# Patient Record
Sex: Male | Born: 1945 | Race: White | Hispanic: No | Marital: Married | Smoking: Never smoker
Health system: Southern US, Community
[De-identification: ages and names within clinical notes are randomized; demographics above are authoritative.]

## PROBLEM LIST (undated history)

## (undated) DIAGNOSIS — N289 Disorder of kidney and ureter, unspecified: Secondary | ICD-10-CM

## (undated) DIAGNOSIS — J189 Pneumonia, unspecified organism: Secondary | ICD-10-CM

## (undated) DIAGNOSIS — J988 Other specified respiratory disorders: Secondary | ICD-10-CM

## (undated) DIAGNOSIS — K635 Polyp of colon: Secondary | ICD-10-CM

## (undated) DIAGNOSIS — E663 Overweight: Secondary | ICD-10-CM

## (undated) DIAGNOSIS — Z8742 Personal history of other diseases of the female genital tract: Secondary | ICD-10-CM

## (undated) DIAGNOSIS — C4491 Basal cell carcinoma of skin, unspecified: Secondary | ICD-10-CM

## (undated) DIAGNOSIS — R339 Retention of urine, unspecified: Secondary | ICD-10-CM

## (undated) DIAGNOSIS — K769 Liver disease, unspecified: Secondary | ICD-10-CM

## (undated) DIAGNOSIS — D649 Anemia, unspecified: Secondary | ICD-10-CM

## (undated) DIAGNOSIS — Z860100 Personal history of colon polyps, unspecified: Secondary | ICD-10-CM

## (undated) DIAGNOSIS — K648 Other hemorrhoids: Secondary | ICD-10-CM

## (undated) DIAGNOSIS — I10 Essential (primary) hypertension: Secondary | ICD-10-CM

## (undated) DIAGNOSIS — E7800 Pure hypercholesterolemia, unspecified: Secondary | ICD-10-CM

## (undated) DIAGNOSIS — N400 Enlarged prostate without lower urinary tract symptoms: Secondary | ICD-10-CM

## (undated) DIAGNOSIS — R053 Chronic cough: Secondary | ICD-10-CM

## (undated) DIAGNOSIS — R7309 Other abnormal glucose: Secondary | ICD-10-CM

## (undated) DIAGNOSIS — R0600 Dyspnea, unspecified: Secondary | ICD-10-CM

## (undated) DIAGNOSIS — J309 Allergic rhinitis, unspecified: Secondary | ICD-10-CM

## (undated) HISTORY — DX: Personal history of colonic polyps: Z86.010

## (undated) HISTORY — DX: Overweight: E66.3

## (undated) HISTORY — DX: Allergic rhinitis, unspecified: J30.9

## (undated) HISTORY — DX: Other abnormal glucose: R73.09

## (undated) HISTORY — DX: Other hemorrhoids: K64.8

## (undated) HISTORY — DX: Personal history of colon polyps, unspecified: Z86.0100

## (undated) HISTORY — DX: Basal cell carcinoma of skin, unspecified: C44.91

## (undated) HISTORY — DX: Pure hypercholesterolemia, unspecified: E78.00

## (undated) HISTORY — DX: Disorder of kidney and ureter, unspecified: N28.9

## (undated) HISTORY — DX: Personal history of other diseases of the female genital tract: Z87.42

## (undated) HISTORY — DX: Liver disease, unspecified: K76.9

## (undated) HISTORY — DX: Benign prostatic hyperplasia without lower urinary tract symptoms: N40.0

## (undated) HISTORY — DX: Retention of urine, unspecified: R33.9

## (undated) HISTORY — DX: Essential (primary) hypertension: I10

---

## 1976-04-28 HISTORY — PX: HEMORRHOID SURGERY: SHX153

## 1976-04-28 HISTORY — PX: VASECTOMY: SHX75

## 1980-04-28 HISTORY — PX: LAPAROSCOPIC CHOLECYSTECTOMY: SUR755

## 2005-07-10 ENCOUNTER — Ambulatory Visit: Payer: Self-pay | Admitting: Unknown Physician Specialty

## 2006-10-08 ENCOUNTER — Other Ambulatory Visit: Payer: Self-pay

## 2006-10-08 ENCOUNTER — Ambulatory Visit: Payer: Self-pay

## 2009-04-19 ENCOUNTER — Emergency Department: Payer: Self-pay | Admitting: Emergency Medicine

## 2010-04-28 DIAGNOSIS — C4491 Basal cell carcinoma of skin, unspecified: Secondary | ICD-10-CM

## 2010-04-28 HISTORY — DX: Basal cell carcinoma of skin, unspecified: C44.91

## 2010-04-28 HISTORY — PX: BASAL CELL CARCINOMA EXCISION: SHX1214

## 2010-10-03 IMAGING — US US EXTREM LOW VENOUS*L*
1 series · 17 of 24 positions shown · non-contrast
Comparison: none

REASON FOR EXAM: leg pain/swelling
COMMENTS:

PROCEDURE:     US  - US DOPPLER LOW EXTR LEFT  - April 20, 2009 [DATE]
RESULT:     There is no evidence of deep venous thrombosis. Color flow
analysis, Doppler analysis and compression studies are negative. Mildly
enlarged, left inguinal lymph node is noted. This is nonspecific.

[Series 1: us extrem low venous*left* · 17 of 30 slices shown]
[im 1/30]
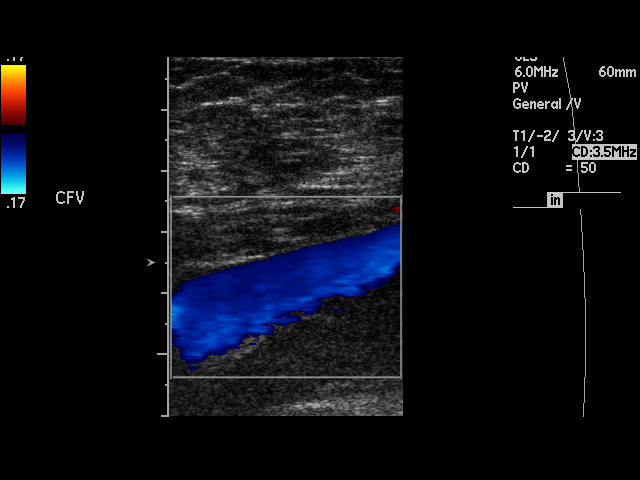
[im 3/30]
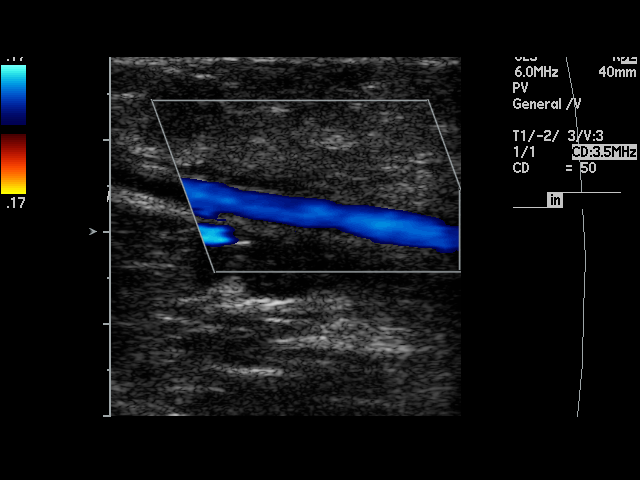
[im 4/30]
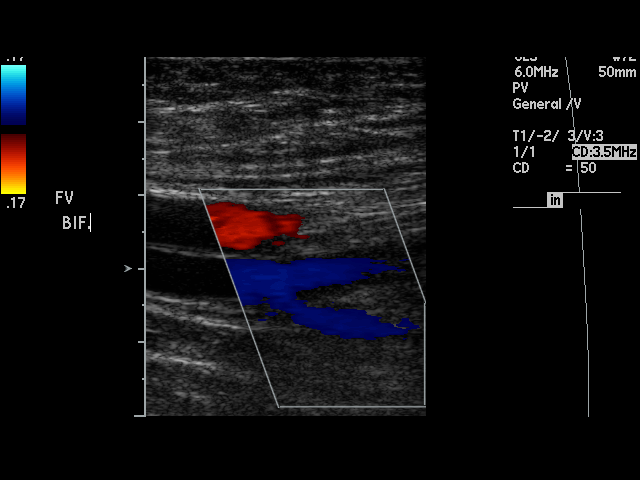
[im 6/30]
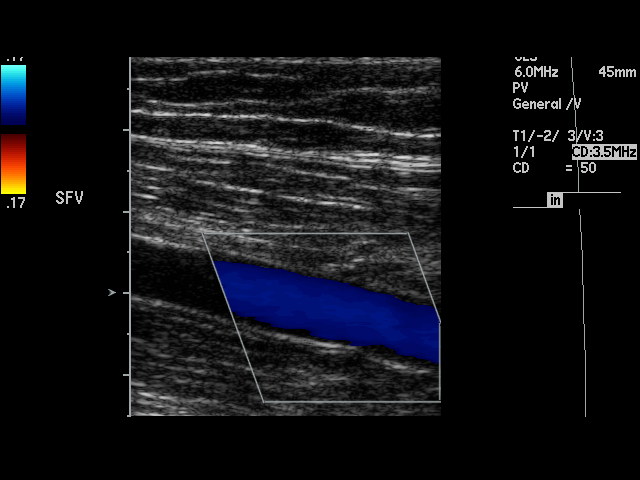
[im 8/30]
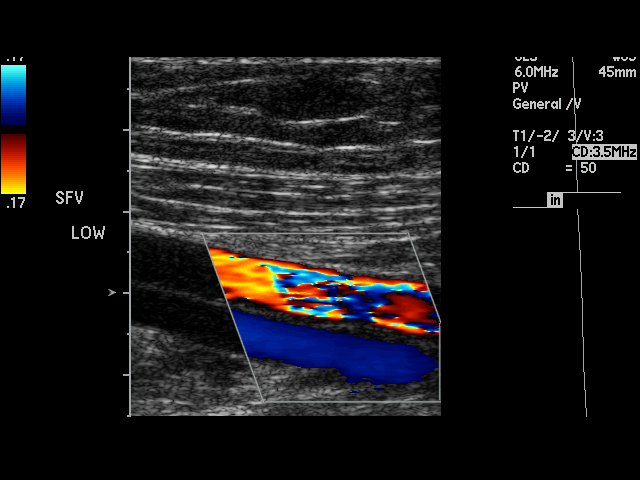
[im 9/30]
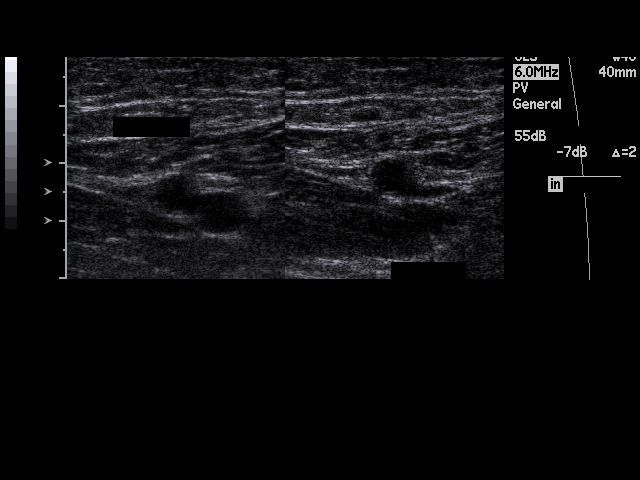
[im 12/30]
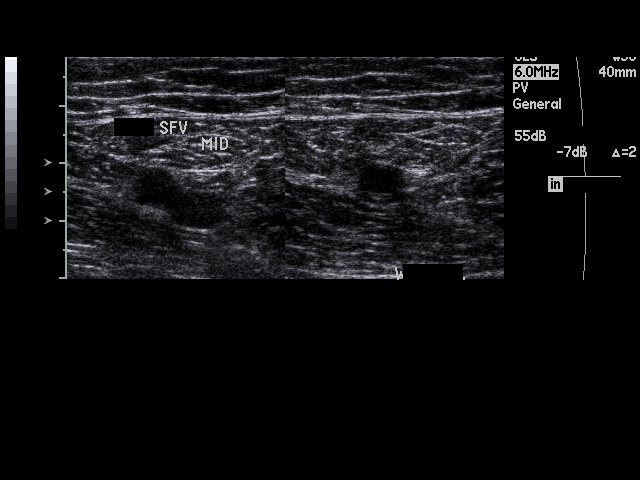
[im 13/30]
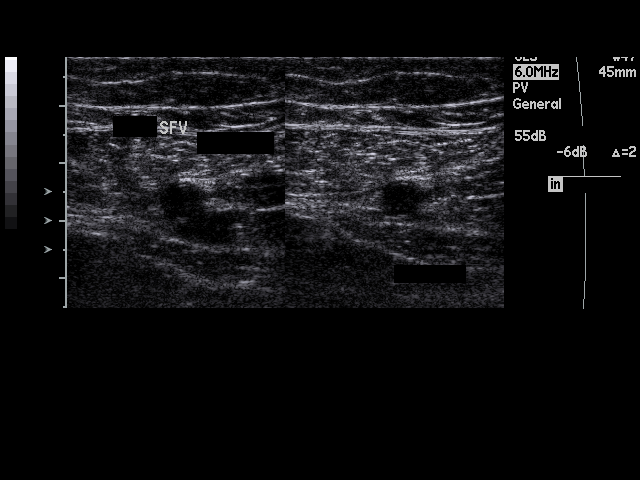
[im 16/30]
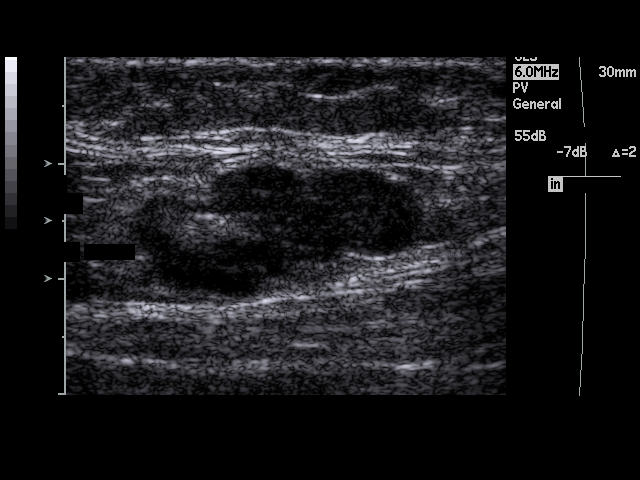
[im 17/30]
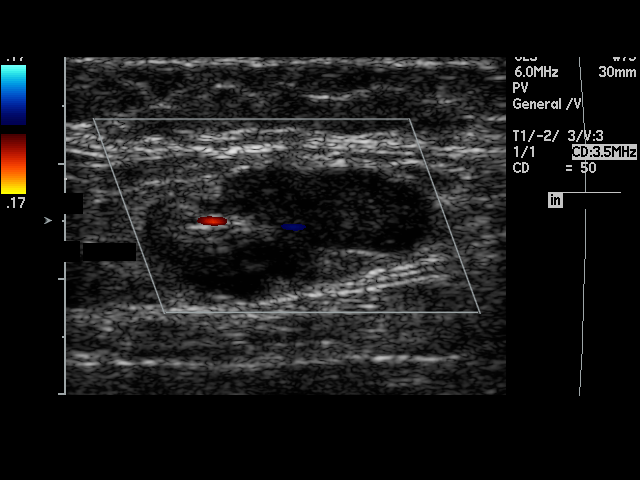
[im 18/30]
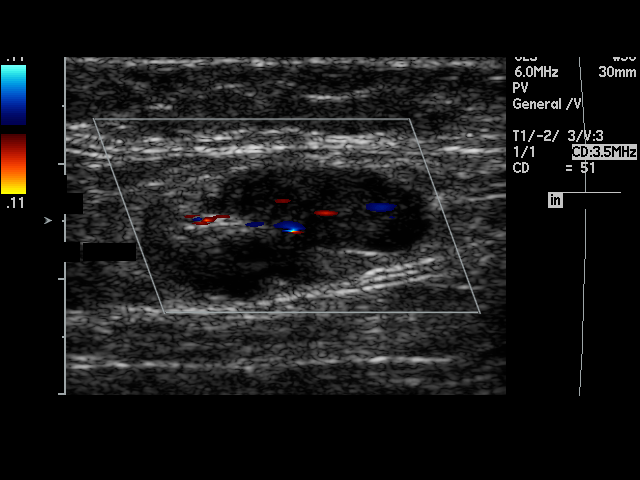
[im 21/30]
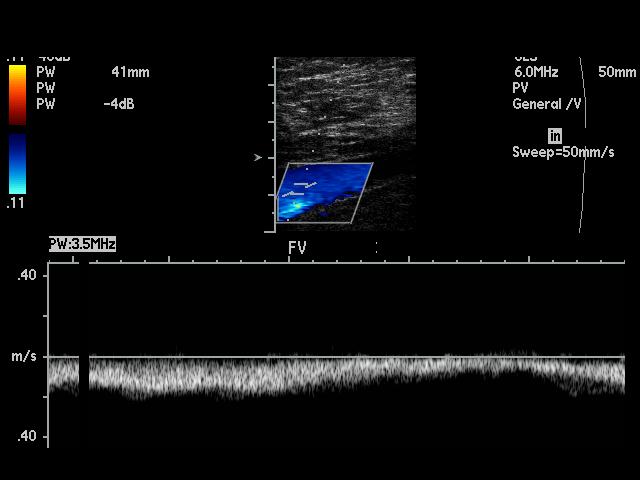
[im 22/30]
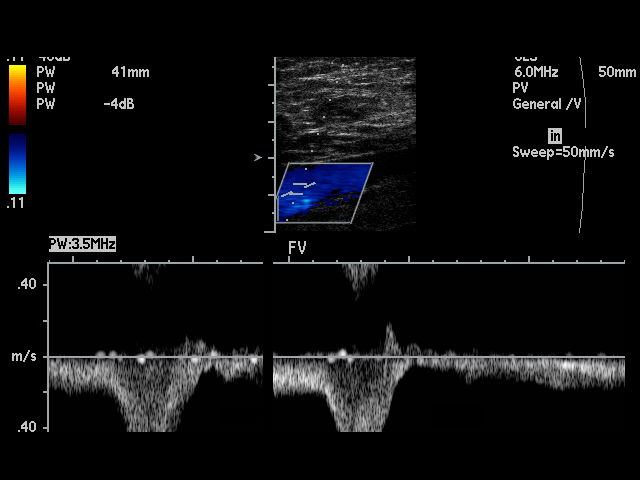
[im 24/30]
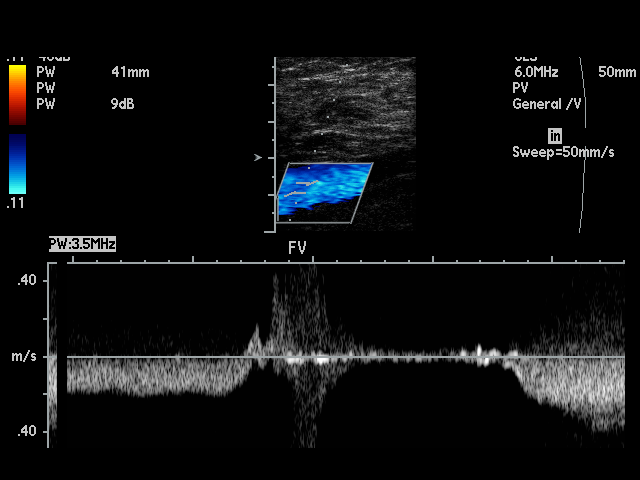
[im 26/30]
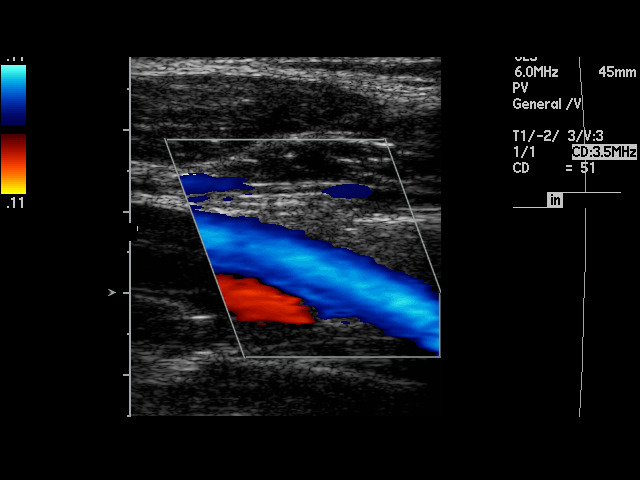
[im 27/30]
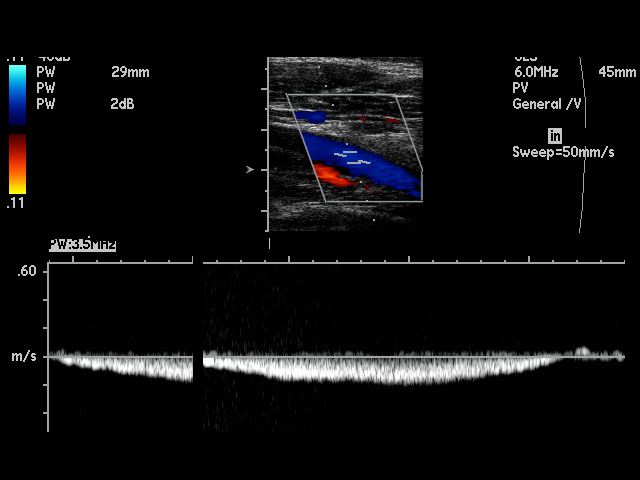
[im 30/30]
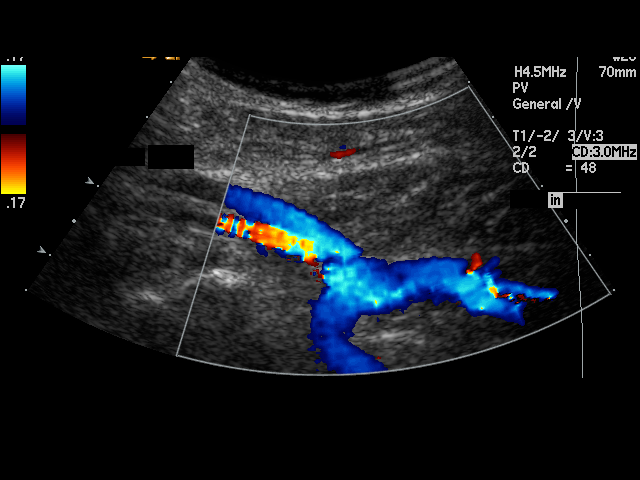

[17 of 24 positions shown; findings below may reference images not displayed]

IMPRESSION: 1.  No evidence of deep venous thrombosis.
2.  Mild prominence of left inguinal lymph node.

## 2010-11-13 ENCOUNTER — Ambulatory Visit: Payer: Self-pay | Admitting: Unknown Physician Specialty

## 2010-11-13 HISTORY — PX: COLONOSCOPY: SHX174

## 2010-11-15 LAB — PATHOLOGY REPORT

## 2011-05-06 ENCOUNTER — Ambulatory Visit: Payer: Self-pay | Admitting: Family Medicine

## 2012-05-17 ENCOUNTER — Encounter: Payer: Self-pay | Admitting: *Deleted

## 2012-05-20 ENCOUNTER — Encounter: Payer: Self-pay | Admitting: *Deleted

## 2012-05-29 HISTORY — PX: OTHER SURGICAL HISTORY: SHX169

## 2012-06-24 ENCOUNTER — Ambulatory Visit: Payer: Self-pay | Admitting: Otolaryngology

## 2012-10-25 ENCOUNTER — Encounter: Payer: Self-pay | Admitting: Family Medicine

## 2012-11-01 ENCOUNTER — Telehealth: Payer: Self-pay

## 2012-11-01 NOTE — Telephone Encounter (Signed)
PT NEVER SEEN HERE BEFORE, BUT HAVE AN APPT WITH DR [name] IN [place], SHE USE TO SEE HIM IN [place]. HE IS OUT OF HIS BP MEDICINE UNTIL HIS APPT PLEASE CALL HOME AT [phone] OR HIS CELL AT [phone]    [facility] ON [address] IN [place]

## 2012-11-01 NOTE — Telephone Encounter (Signed)
Left message, what medication is he requesting?

## 2012-11-02 MED ORDER — LISINOPRIL 10 MG PO TABS: 10.0000 mg | ORAL_TABLET | Freq: Every day | ORAL | Status: DC

## 2012-11-02 NOTE — Telephone Encounter (Signed)
Left message - rx sent in  

## 2012-11-02 NOTE — Telephone Encounter (Signed)
PT TAKE THE 10MG S OF LISINOPRIL. PLEASE CALL [phone] OR HIS CELL AT [phone]   [facility] ON [address] IN [place]

## 2012-11-02 NOTE — Telephone Encounter (Signed)
Call pt --- I sent in rx for Lisinopril 10mg  one daily to [facility] on [address].

## 2012-11-29 ENCOUNTER — Encounter: Payer: Self-pay | Admitting: Family Medicine

## 2012-11-29 ENCOUNTER — Ambulatory Visit (INDEPENDENT_AMBULATORY_CARE_PROVIDER_SITE_OTHER): Payer: Medicare Other | Admitting: Family Medicine

## 2012-11-29 VITALS — BP 164/100 | HR 79 | Temp 97.8°F | Resp 16 | Ht 72.0 in | Wt 231.2 lb

## 2012-11-29 DIAGNOSIS — I10 Essential (primary) hypertension: Secondary | ICD-10-CM | POA: Insufficient documentation

## 2012-11-29 DIAGNOSIS — E7800 Pure hypercholesterolemia, unspecified: Secondary | ICD-10-CM | POA: Insufficient documentation

## 2012-11-29 DIAGNOSIS — R7309 Other abnormal glucose: Secondary | ICD-10-CM

## 2012-11-29 DIAGNOSIS — Z0000 Encounter for general adult medical examination without abnormal findings: Secondary | ICD-10-CM

## 2012-11-29 MED ORDER — SIMVASTATIN 20 MG PO TABS: 20.0000 mg | ORAL_TABLET | Freq: Every evening | ORAL | Status: DC

## 2012-11-29 MED ORDER — LISINOPRIL 10 MG PO TABS: 10.0000 mg | ORAL_TABLET | Freq: Every day | ORAL | Status: DC

## 2012-11-29 NOTE — Assessment & Plan Note (Signed)
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 No change in medications; obtain EKG, u/a, labs.  Refill provided; f/u six months.

## 2012-11-29 NOTE — Progress Notes (Signed)
  Subjective:    Patient ID: Ruben Garcia, male    DOB: 1945/07/01, 67 y.o.   MRN: [id number]  HPI    Review of Systems  Hematological: Bruises/bleeds easily.       Objective:   Physical Exam        Assessment & Plan:

## 2012-11-29 NOTE — Assessment & Plan Note (Signed)
Stable; weight gain in past year of 9 pounds; recommend weight loss, exercise, dietary modification.  Obtain labs.

## 2012-11-29 NOTE — Assessment & Plan Note (Signed)
Anticipatory guidance --- weight loss, exercise.  Colonoscopy UTD.  Immunizations reviewed; clarify if received Pneumovax last year at [facility].  Evaluated every November by urology/Shannon McGowan.  Independent with ADLs; no evidence of depression; mild hearing loss and s/p recent formal hearing evaluation by Dr. Rickard Rhymes.  Low fall risk.  Has Living will.

## 2012-11-29 NOTE — Progress Notes (Signed)
[address]   [place], [place]  [address]   [phone]  Subjective:    Patient ID: Ruben Garcia, male    DOB: 21-May-1945, 67 y.o.   MRN: [id number]  HPI This 67 y.o. male presents for Welcome to Medicare CPE.  Last physical 09/2011. Colonoscopy 11/13/10; repeat in 5 years; one polyp; [place]. TDAP 05/03/2008. Zostavax 03/12/2011. Pneumovax 2013. Flu vaccine 01/28/2012. [facility]; no glaucoma or cataracts; reading glasses. Dental exam every six months.  HTN:  Not checking BP at home; [facility]; checked last week and [facility].  Reports good compliance with Lisinopril 10mg  daily; good tolerance to medication; good symptom control. When was taking Lisinopril 20 or 30mg , BP was frequently less than 100 systolic; felt very weak and dizzy.  This has resolved.  Hyperlipidemia:  Compliance with Simvastatin.  One year follow-up.  Reports good tolerance to medication and good symptom control.  Needs a refill.  BPH:  Urologist:  Michiel Cowboy.  Followed every November. Performs prostate and genital exam yearly.  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   Review of Systems  Constitutional: Negative.   HENT: Positive for hearing loss and tinnitus. Negative for ear pain, congestion, facial swelling, rhinorrhea, sneezing, neck pain, neck stiffness, postnasal drip and ear discharge.   Eyes: Negative.   Respiratory: Negative.   Cardiovascular: Negative.   Gastrointestinal: Negative.   Endocrine: Negative.   Genitourinary: Negative.   Musculoskeletal: Positive for arthralgias. Negative for myalgias, back pain, joint swelling and gait problem.  Skin: Negative.   Allergic/Immunologic: Negative.   Neurological: Negative.   Hematological: Negative for adenopathy. Bruises/bleeds easily.  Psychiatric/Behavioral: Negative.     Past Medical History  Diagnosis Date  . Hemorrhoids, internal   . Unspecified disorder of liver   . Other  abnormal glucose   . Personal history of colonic polyps   . Hypotension, unspecified   . Dehydration   . Personal history of other genital system and obstetric disorders(V13.29)   . Allergic rhinitis, cause unspecified   . Essential hypertension, benign   . Pure hypercholesterolemia   . Unspecified disorder of kidney and ureter   . Basal cell carcinoma of skin, site unspecified 04/28/2010    Nasal; Orson Aloe.    Past Surgical History  Procedure Laterality Date  . Hemorrhoid surgery      Adalene Gulotta  . Gallbladder surgery  1984  . Basal cell carcinoma excision  2012    Facial        [place]  . Ear surgery  05/2012    [name].  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  Benign.  . Colonoscopy  11/13/2010    single polyp; repeatin 5 years; M[facility]lin    Prior to Admission medications   Medication Sig Start Date End Date Taking? Authorizing Provider  aspirin 81 MG tablet Take 81 mg by mouth daily.   Yes Historical Provider, MD  Calcium Carbonate-Vitamin D (CALCIUM 600+D) 600-200 MG-UNIT TABS Take 1 tablet by mouth daily.   Yes Historical Provider, MD  cholecalciferol (VITAMIN D) 400 UNITS TABS Take 400 Units by mouth daily.   Yes Historical Provider, MD  cyanocobalamin 100 MCG tablet Take 100 mcg by mouth daily.   Yes Historical Provider, MD  lisinopril (PRINIVIL,ZESTRIL) 10 MG tablet Take 1 tablet (10 mg total) by mouth daily. 11/29/12  Yes Ethelda Chick, MD  Multiple Vitamins-Minerals (MULTIVITAMIN PO) Take by mouth daily.   Yes Historical Provider,  MD  Omega-3 Fatty Acids (FISH OIL CONCENTRATE) 1000 MG CAPS Take 1 capsule by mouth daily.   Yes Historical Provider, MD  simvastatin (ZOCOR) 20 MG tablet Take 1 tablet (20 mg total) by mouth every evening. 11/29/12  Yes Ethelda Chick, MD  vitamin C (ASCORBIC ACID) 500 MG tablet Take 500 mg by mouth daily.   Yes Historical Provider, MD  vitamin E 400 UNIT capsule Take 400 Units by mouth daily.   Yes Historical Provider, MD    Allergies  Allergen Reactions  .  Codeine Nausea Only    History   Social History  . Marital Status: Married    Spouse Name: N/A    Number of Children: 2  . Years of Education: college   Occupational History  . retired     Research officer, political party in 2005   Social History Main Topics  . Smoking status: Never Smoker   . Smokeless tobacco: Not on file  . Alcohol Use: Yes     Comment: occasional weekends beer, 4- 5 total Miller Light  . Drug Use: No  . Sexually Active: Yes   Other Topics Concern  . Not on file   Social History Narrative   Always uses seat belts. Smoke alarm and carbon monoxide detector in the home. Guns in the home stored in locked cabinet.Caffeine use: Coffee 2 servings per day, moderate amount. Exercise: Moderate walking 2 - 4 miles daily, 2 - 3 days per week.    Married x 44 years, happily.      Children:  2 children; 1 grandchild.      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      Tobacco: never      Alcohol:  Weekends; beer 4-6 per weekend.      Exercise:  Walking sporadically.      Seatbelt: 100%      Guns: secured guns.      Living Will: completed in 2014.  FULL CODE but no prolonged measures.      Family History  Problem Relation Age of Onset  . Heart disease Mother   . Diabetes Mother   . Hyperlipidemia Mother   . Arthritis Mother     rheumatoid  . Cancer Father     lung  . Diabetes Father   . Arthritis Sister   . Hyperlipidemia Sister   . Hyperlipidemia Sister   . Hypertension Sister   . Hyperlipidemia Sister        Objective:   Physical Exam  Nursing note and vitals reviewed. Constitutional: He is oriented to person, place, and time. He appears well-developed and well-nourished. No distress.  HENT:  Head: Normocephalic and atraumatic.  Right Ear: External ear normal.  Left Ear: External ear normal.  Nose: Nose normal.  Mouth/Throat: Oropharynx is clear and moist.  Eyes: Conjunctivae and EOM are normal. Pupils are equal, round, and reactive to light.    Neck: Normal range of motion. Neck supple. No JVD present. No thyromegaly present.  Cardiovascular: Normal rate, regular rhythm, normal heart sounds and intact distal pulses.  Exam reveals no gallop and no friction rub.   No murmur heard. Pulmonary/Chest: Effort normal and breath sounds normal. He has no wheezes. He has no rales.  Abdominal: Soft. Bowel sounds are normal. He exhibits no distension and no mass. There is no tenderness. There is no rebound and no guarding.  Musculoskeletal: Normal range of motion.  Lymphadenopathy:    He has no cervical adenopathy.  Neurological: He is alert and oriented to person, place, and time. He has normal reflexes. No cranial nerve deficit. He exhibits normal muscle tone. Coordination normal.  Skin: Skin is warm and dry. No rash noted. He is not diaphoretic. No erythema. No pallor.  Psychiatric: He has a normal mood and affect. His behavior is normal. Judgment and thought content normal.   EKG:  NSR; no ST changes.    Assessment & Plan:  Annual physical exam - Plan: EKG 12-Lead  Essential hypertension, benign - Plan: lisinopril (PRINIVIL,ZESTRIL) 10 MG tablet, CANCELED: CBC with Differential, CANCELED: CK, CANCELED: Comprehensive metabolic panel, CANCELED: POCT urinalysis dipstick  Pure hypercholesterolemia - Plan: simvastatin (ZOCOR) 20 MG tablet, CANCELED: CK, CANCELED: Lipid panel  Other abnormal glucose - Plan: CANCELED: Hemoglobin A1c

## 2012-11-29 NOTE — Assessment & Plan Note (Signed)
Controlled; obtain labs; refill provided of medication.  F/u six months.

## 2012-12-10 ENCOUNTER — Telehealth: Payer: Self-pay

## 2012-12-10 NOTE — Telephone Encounter (Signed)
Patient is requesting his labs be sent via e-mail to his daughter:  [email or web address]   [phone]

## 2012-12-13 NOTE — Telephone Encounter (Signed)
PT STATES HE REALLY NEED HIS LAB WORK SENT TO HIM BECAUSE HE HAVE A [facility]PT TOMORROW. PLEASE CALL [phone] AND LET HIM KNOW WHEN IT IS DONE

## 2012-12-13 NOTE — Telephone Encounter (Signed)
Called patient and apologized on behalf of April for giving the wrong information to the patient that we e-mail records or labs. We do not have that ability to do that. Patient understood. His records will be ready for pick up today, and he will come pick them up for his appt for the [place].

## 2012-12-14 ENCOUNTER — Telehealth: Payer: Self-pay

## 2012-12-14 NOTE — Telephone Encounter (Signed)
No labs on this patient. They were cancelled in the system. Asked Renee in the lab about this and she wasn't sure why the labs were cancelled. Left message for patient that we don't have any labs in the system.

## 2012-12-14 NOTE — Telephone Encounter (Signed)
PT STATES HE HAD REQUESTED HIS RECORDS BECAUSE OF AN [facility], CAME YESTERDAY TO PICK THEM UP, BUT NO LAB OR BLOOD WORK WAS INCLUDED WHICH HE NEED WOULD LIKE [place] TO FAX IT TO THE [facility] ATTN: CAROL MCMURROW P.A.C AT [phone] AND YOU MAY REACH PT AT [phone] WHEN DONE

## 2013-06-06 ENCOUNTER — Ambulatory Visit (INDEPENDENT_AMBULATORY_CARE_PROVIDER_SITE_OTHER): Payer: Medicare Other | Admitting: Family Medicine

## 2013-06-06 ENCOUNTER — Encounter: Payer: Self-pay | Admitting: Family Medicine

## 2013-06-06 VITALS — BP 140/80 | HR 70 | Temp 98.6°F | Resp 16 | Ht 71.5 in | Wt 224.6 lb

## 2013-06-06 DIAGNOSIS — R7309 Other abnormal glucose: Secondary | ICD-10-CM

## 2013-06-06 DIAGNOSIS — E7800 Pure hypercholesterolemia, unspecified: Secondary | ICD-10-CM

## 2013-06-06 DIAGNOSIS — I10 Essential (primary) hypertension: Secondary | ICD-10-CM

## 2013-06-06 NOTE — Progress Notes (Signed)
Subjective:    Patient ID: Ruben Garcia, male    DOB: 07-11-1945, 68 y.o.   MRN: [id number] This chart was scribed for Ruben Honour, MD by Rolanda Lundborg, ED Scribe. This patient was seen in room 22 and the patient's care was started at 9:33 AM.  Chief Complaint  Patient presents with  . Hypertension  . Hyperglycemia  . Hyperlipidemia    HPI HPI Comments: Ruben Garcia is a 68 y.o. male with a h/o HTN, hyperglycemia, and hyperlipidemia who presents to the [facility] for a 6 month follow up for HTN, hyperlipidemia, and glucose intolerance. Last visit sugar was 107. Hemoglobin A1C was normal at 5.3. Last physical in August 2014. His weight is down 7 pounds from last visit.   He states he and his wife have been on a diet since Christmas that is working well for them. He states his BP at home has been fluctuating as usual. It was 125/79 last night and 140/90 at home. He states the highest it goes is 150, which is rare. He is exercising 30 minutes 2x per week on the elliptical at home. He had his flu shot this year. He is having labs down tomorrow morning at [facility]. Reports compliance with Lisinopril 10mg  daily and Simvastatin 20mg  daily.  No side effects to medication; good tolerance to medication; good symptom control. Denies CP/palp/SOB/leg swelling/HA/dizziness/focal weakness.  PCP Reginia Forts, MD   Past Medical History  Diagnosis Date  . Hemorrhoids, internal   . Unspecified disorder of liver   . Other abnormal glucose   . Personal history of colonic polyps   . Hypotension, unspecified   . Dehydration   . Personal history of other genital system and obstetric disorders(V13.29)   . Allergic rhinitis, cause unspecified   . Essential hypertension, benign   . Pure hypercholesterolemia   . Unspecified disorder of kidney and ureter   . Basal cell carcinoma of skin, site unspecified 04/28/2010    Nasal; [name].   Current Outpatient Prescriptions on File Prior  to Visit  Medication Sig Dispense Refill  . aspirin 81 MG tablet Take 81 mg by mouth daily.      . Calcium Carbonate-Vitamin D (CALCIUM 600+D) 600-200 MG-UNIT TABS Take 1 tablet by mouth daily.      . cholecalciferol (VITAMIN D) 400 UNITS TABS Take 400 Units by mouth daily.      . cyanocobalamin 100 MCG tablet Take 100 mcg by mouth daily.      [name] Kitchen lisinopril (PRINIVIL,ZESTRIL) 10 MG tablet Take 1 tablet (10 mg total) by mouth daily.  90 tablet  3  . Multiple Vitamins-Minerals (MULTIVITAMIN PO) Take by mouth daily.      . Omega-3 Fatty Acids (FISH OIL CONCENTRATE) 1000 MG CAPS Take 1 capsule by mouth daily.      . simvastatin (ZOCOR) 20 MG tablet Take 1 tablet (20 mg total) by mouth every evening.  90 tablet  3  . vitamin C (ASCORBIC ACID) 500 MG tablet Take 500 mg by mouth daily.      . vitamin E 400 UNIT capsule Take 400 Units by mouth daily.       No current facility-administered medications on file prior to visit.   Allergies  Allergen Reactions  . Codeine Nausea Only   History   Social History  . Marital Status: Married    Spouse Name: N/A    Number of Children: 2  . Years of Education: college   Occupational History  .  retired     Actor in 2005   Social History Main Topics  . Smoking status: Never Smoker   . Smokeless tobacco: Not on file  . Alcohol Use: Yes     Comment: occasional weekends beer, 4- 5 total Miller Light  . Drug Use: No  . Sexual Activity: Yes   Other Topics Concern  . Not on file   Social History Narrative   Always uses seat belts. Smoke alarm and carbon monoxide detector in the home. Guns in the home stored in locked cabinet.Caffeine use: Coffee 2 servings per day, moderate amount. Exercise: Moderate walking 2 - 4 miles daily, 2 - 3 days per week.    Married x 44 years, happily.      Children:  2 children; 1 grandchild.      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      Tobacco: never      Alcohol:  Weekends; beer 4-6 per  weekend.      Exercise:  Walking sporadically.      Seatbelt: 100%      Guns: secured guns.      Living Will: completed in 2014.  FULL CODE but no prolonged measures.       Review of Systems  Constitutional: Negative for activity change, appetite change, fatigue and unexpected weight change.  Eyes: Negative for visual disturbance.  Respiratory: Negative for cough, shortness of breath, wheezing and stridor.   Cardiovascular: Positive for palpitations (rarely). Negative for chest pain and leg swelling.  Gastrointestinal: Negative for nausea, vomiting, abdominal pain, diarrhea and constipation.  Musculoskeletal: Positive for arthralgias (mild, bone spurs in shoulders).  Neurological: Negative for dizziness, tremors, facial asymmetry, speech difficulty, weakness, light-headedness, numbness and headaches.       Objective:   Physical Exam  Nursing note and vitals reviewed. Constitutional: He is oriented to person, place, and time. He appears well-developed and well-nourished. No distress.  HENT:  Head: Normocephalic and atraumatic.  Right Ear: External ear normal.  Left Ear: External ear normal.  Nose: Nose normal.  Mouth/Throat: Oropharynx is clear and moist.  Eyes: Conjunctivae and EOM are normal. Pupils are equal, round, and reactive to light.  Neck: Normal range of motion. Neck supple. No JVD present. Carotid bruit is not present. No tracheal deviation present.  Cardiovascular: Normal rate, regular rhythm and normal heart sounds.  Exam reveals no gallop and no friction rub.   No murmur heard. Pulmonary/Chest: Effort normal and breath sounds normal. No respiratory distress. He has no wheezes. He has no rales. He exhibits no tenderness.  Abdominal: Soft. Bowel sounds are normal. He exhibits no distension and no mass. There is no tenderness. There is no rebound and no guarding.  Musculoskeletal: Normal range of motion.  No swelling BLE.  Lymphadenopathy:    He has no cervical  adenopathy.  Neurological: He is alert and oriented to person, place, and time.  Skin: Skin is warm and dry. He is not diaphoretic.  Psychiatric: He has a normal mood and affect. His behavior is normal.    Filed Vitals:   06/06/13 0916  BP: 140/80  Pulse: 70  Temp: 98.6 F (37 C)  TempSrc: Oral  Resp: 16  Height: 5' 11.5" (1.816 m)  Weight: 224 lb 9.6 oz (101.878 kg)  SpO2: 97%         Assessment & Plan:   1. Pure hypercholesterolemia: controlled; obtain labs; continue current medication.  2. Other abnormal glucose : controlled  with weight loss, dietary modification, weight loss.  3. Essential hypertension, benign : controlled; obtain labs; continue current medications.   No orders of the defined types were placed in this encounter.   I personally performed the services described in this documentation, which was scribed in my presence.  The recorded information has been reviewed and is accurate.  Reginia Forts, M.D.  [facility] [address] [place], [place]  [address] [phone] phone [phone] fax

## 2013-06-12 ENCOUNTER — Telehealth: Payer: Self-pay | Admitting: *Deleted

## 2013-06-12 NOTE — Telephone Encounter (Signed)
lmom to cb for lab results.  1. No evidence of anemia 2. Blood sugar eleveted at 111. Recommend wt loss, exercise, low-sugar food choices. 3. One liver function test (ALT-47) slightly elevated.  Recommend repeating in 6 months at next visit. 4. Kidney function is normal 5. Cholesterol is under good control.  Dr. Tamala Julian

## 2013-06-12 NOTE — Telephone Encounter (Signed)
Pt notified of lab results

## 2013-07-13 ENCOUNTER — Encounter: Payer: Self-pay | Admitting: Family Medicine

## 2013-12-05 ENCOUNTER — Encounter: Payer: Self-pay | Admitting: Family Medicine

## 2013-12-05 ENCOUNTER — Ambulatory Visit (INDEPENDENT_AMBULATORY_CARE_PROVIDER_SITE_OTHER): Payer: Medicare Other | Admitting: Family Medicine

## 2013-12-05 VITALS — BP 165/86 | HR 63 | Temp 97.9°F | Resp 16 | Ht 71.5 in | Wt 217.8 lb

## 2013-12-05 DIAGNOSIS — Z23 Encounter for immunization: Secondary | ICD-10-CM

## 2013-12-05 DIAGNOSIS — R7309 Other abnormal glucose: Secondary | ICD-10-CM

## 2013-12-05 DIAGNOSIS — E7800 Pure hypercholesterolemia, unspecified: Secondary | ICD-10-CM

## 2013-12-05 DIAGNOSIS — Z125 Encounter for screening for malignant neoplasm of prostate: Secondary | ICD-10-CM

## 2013-12-05 DIAGNOSIS — Z0000 Encounter for general adult medical examination without abnormal findings: Secondary | ICD-10-CM

## 2013-12-05 DIAGNOSIS — I10 Essential (primary) hypertension: Secondary | ICD-10-CM

## 2013-12-05 LAB — POCT URINALYSIS DIPSTICK
Bilirubin, UA: NEGATIVE
Blood, UA: NEGATIVE
Glucose, UA: NEGATIVE
KETONES UA: NEGATIVE
LEUKOCYTES UA: NEGATIVE
Nitrite, UA: NEGATIVE
PH UA: 6
Protein, UA: NEGATIVE
Urobilinogen, UA: 0.2

## 2013-12-05 MED ORDER — LISINOPRIL 20 MG PO TABS: 20.0000 mg | ORAL_TABLET | Freq: Every day | ORAL | Status: DC

## 2013-12-05 MED ORDER — SIMVASTATIN 20 MG PO TABS: 20.0000 mg | ORAL_TABLET | Freq: Every evening | ORAL | Status: DC

## 2013-12-05 NOTE — Progress Notes (Signed)
Subjective:   This chart was scribed for Ruben Honour, MD by Thea Alken, ED Scribe. This patient was seen in room 25 and the patient's care was started at 8:42 AM.  Patient ID: Ruben Garcia, male    DOB: 1946/01/28, 68 y.o.   MRN: [id number]  HPI Chief Complaint  Patient presents with  . Annual Exam  . Hyperlipidemia  . Hypertension  . Hyperglycemia   HPI Comments: Ruben Garcia is a 68 y.o. male with h/o of hypercholesterolemia and HTN who presents to the [facility] for a physical exam.  Last visit 05/2013, during that visit pt had slightly elevated BP.   Pt weight was also down 7lbs at that time  Last physical 11/29/12. Colonoscopy 2012 repeat in 5 years  VACCINATIONS TDAP 2010.  Pneumonia vaccine  2013 Zoster vac 2012 Flu vaccines yearly.  OTHER PHYSICIANS Last seen optometrist 1 year ago. Pt was seen by dentist a couple weeks ago Dermatologist, Dr. [name]. Pt plans to to make an appointment with him soon.  PSH. Pt denies new health problems with his sisters. Pt has ben married 45 years. He has 2 children and 1 grandchild. Pt drinks 4-5 beers per weekend interchangeably with water. Pt walks 2.5 miles every other day. Pt made living will in 2014. Pt is allergic to codeine.  Pt reports he checks BP at stores such as [facility]. He reports BP is usually around 124/70's-80's. Pt denies new surgeries in that last year. Pt takes aspirin, lisinopril 10mg , simvastatin, vitamin E, vitamin C, B12 multi vitamins, fish oil 4 daily.  Pt has neck stiffness possibly from sleeping wrong.Pt denies knee pain and swelling in feet.   Pt reports intermittent trouble with erection. He denies decrease in sexual drive. He denies taking Viagra in the past.  He denies abdominal pain and bladder and bladder incontinence. He report he wakes up once to urinate at night. He denies straining to use the bathroom. Pt denies change in hearing. He has tinnitus but has always had this  without recent changes. He denies dizziness, light headiness. Pt denies CP and palpitations. He denies cough, SOB and sores in mouth.      Past Medical History  Diagnosis Date  . Hemorrhoids, internal   . Unspecified disorder of liver   . Other abnormal glucose   . Personal history of colonic polyps   . Personal history of other genital system and obstetric disorders(V13.29)   . Allergic rhinitis, cause unspecified   . Essential hypertension, benign   . Pure hypercholesterolemia   . Unspecified disorder of kidney and ureter   . Basal cell carcinoma of skin, site unspecified 04/28/2010    Nasal; [name].   Past Surgical History  Procedure Laterality Date  . Hemorrhoid surgery      [facility]  . Gallbladder surgery  1984  . Basal cell carcinoma excision  2012    Facial        [place]  . Ear surgery  05/2012    [name].  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  Benign.  . Colonoscopy  11/13/2010    single polyp; repeatin 5 years; [place]  . Cholecystectomy     Prior to Admission medications   Medication Sig Start Date End Date Taking? Authorizing Provider  aspirin 81 MG tablet Take 81 mg by mouth daily.   Yes Historical Provider, MD  Calcium Carbonate-Vitamin D (CALCIUM 600+D) 600-200 MG-UNIT TABS Take 1 tablet by mouth daily.   Yes Historical  Provider, MD  cholecalciferol (VITAMIN D) 400 UNITS TABS Take 400 Units by mouth daily.   Yes Historical Provider, MD  cyanocobalamin 100 MCG tablet Take 100 mcg by mouth daily.   Yes Historical Provider, MD  lisinopril (PRINIVIL,ZESTRIL) 10 MG tablet Take 1 tablet (10 mg total) by mouth daily. 11/29/12  Yes Ruben Honour, MD  Multiple Vitamins-Minerals (MULTIVITAMIN PO) Take by mouth daily.   Yes Historical Provider, MD  Omega-3 Fatty Acids (FISH OIL CONCENTRATE) 1000 MG CAPS Take 1 capsule by mouth daily.   Yes Historical Provider, MD  simvastatin (ZOCOR) 20 MG tablet Take 1 tablet (20 mg total) by mouth every evening. 11/29/12  Yes Ruben Honour, MD  vitamin C  (ASCORBIC ACID) 500 MG tablet Take 500 mg by mouth daily.   Yes Historical Provider, MD  vitamin E 400 UNIT capsule Take 400 Units by mouth daily.   Yes Historical Provider, MD   History   Social History  . Marital Status: Married    Spouse Name: N/A    Number of Children: 2  . Years of Education: college   Occupational History  . retired     Actor in 2005   Social History Main Topics  . Smoking status: Never Smoker   . Smokeless tobacco: Not on file  . Alcohol Use: Yes     Comment: occasional weekends beer, 4- 5 total Miller Light  . Drug Use: No  . Sexual Activity: Yes   Other Topics Concern  . Not on file   Social History Narrative   Always uses seat belts. Smoke alarm and carbon monoxide detector in the home. Guns in the home stored in locked cabinet.Caffeine use: Coffee 2 servings per day, moderate amount. Exercise: Moderate walking 2 - 4 miles daily, 2 - 3 days per week.    Married x 45 years, happily.      Children:  2 children; 1 grandchild.      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      Tobacco: never      Alcohol:  Weekends; beer 4-6 per weekend.      Exercise:  Walking every other day 2.24miles.      Seatbelt: 100%      Guns: secured guns.      Living Will: completed in 2014.  FULL CODE but no prolonged measures.     Family History  Problem Relation Age of Onset  . Heart disease Mother   . Diabetes Mother   . Hyperlipidemia Mother   . Arthritis Mother     rheumatoid  . Cancer Father     lung  . Diabetes Father   . Arthritis Sister   . Hyperlipidemia Sister   . Hyperlipidemia Sister   . Hypertension Sister   . Hyperlipidemia Sister     Review of Systems  Constitutional: Negative for fever, chills, diaphoresis, activity change, appetite change, fatigue and unexpected weight change.  HENT: Positive for tinnitus. Negative for congestion, dental problem, drooling, ear discharge, ear pain, facial swelling, hearing loss, mouth  sores, nosebleeds, postnasal drip, rhinorrhea, sinus pressure, sneezing, sore throat, trouble swallowing and voice change.   Eyes: Negative for photophobia, pain, discharge, redness, itching and visual disturbance.  Respiratory: Negative for apnea, cough, choking, chest tightness, shortness of breath, wheezing and stridor.   Cardiovascular: Negative for chest pain, palpitations and leg swelling.  Gastrointestinal: Negative for nausea, vomiting, abdominal pain, diarrhea, constipation, blood in stool, abdominal distention and anal  bleeding.  Endocrine: Negative for cold intolerance, heat intolerance, polydipsia, polyphagia and polyuria.  Genitourinary: Positive for decreased urine volume. Negative for dysuria, urgency, frequency, hematuria, flank pain, discharge, penile swelling, scrotal swelling, enuresis, difficulty urinating, genital sores, penile pain and testicular pain.  Musculoskeletal: Positive for neck stiffness. Negative for arthralgias, back pain, gait problem, joint swelling, myalgias and neck pain.  Skin: Negative for color change, pallor, rash and wound.  Allergic/Immunologic: Negative for environmental allergies, food allergies and immunocompromised state.  Neurological: Negative for dizziness, tremors, seizures, syncope, facial asymmetry, speech difficulty, weakness, light-headedness, numbness and headaches.  Hematological: Negative for adenopathy. Does not bruise/bleed easily.  Psychiatric/Behavioral: Negative for suicidal ideas, hallucinations, behavioral problems, confusion, sleep disturbance, self-injury, dysphoric mood, decreased concentration and agitation. The patient is not nervous/anxious and is not hyperactive.   All other systems reviewed and are negative.   Objective:   Physical Exam  Nursing note and vitals reviewed. Constitutional: He is oriented to person, place, and time. He appears well-developed and well-nourished. No distress.  HENT:  Head: Normocephalic and  atraumatic.  Right Ear: Hearing, tympanic membrane, external ear and ear canal normal.  Left Ear: Hearing, tympanic membrane, external ear and ear canal normal.  Nose: Nose normal.  Mouth/Throat: Uvula is midline, oropharynx is clear and moist and mucous membranes are normal.  Eyes: Conjunctivae and EOM are normal. Pupils are equal, round, and reactive to light.  Neck: Normal range of motion. Neck supple. Carotid bruit is not present. No thyromegaly present.  Cardiovascular: Normal rate, regular rhythm, normal heart sounds and intact distal pulses.  Exam reveals no gallop and no friction rub.   No murmur heard. BP-160/86  Pulmonary/Chest: Effort normal and breath sounds normal. No respiratory distress. He has no wheezes. He has no rales. He exhibits no tenderness.  Abdominal: Soft. Bowel sounds are normal. He exhibits no distension and no mass. There is no tenderness. There is no rebound and no guarding. Hernia confirmed negative in the right inguinal area and confirmed negative in the left inguinal area.  Genitourinary: Rectum normal, prostate normal and penis normal. Right testis shows no mass, no swelling and no tenderness. Left testis shows no mass, no swelling and no tenderness. Circumcised.  Musculoskeletal: Normal range of motion.       Right shoulder: Normal.       Left shoulder: Normal.       Cervical back: Normal.  Lymphadenopathy:    He has no cervical adenopathy.       Right: No inguinal adenopathy present.       Left: No inguinal adenopathy present.  Neurological: He is alert and oriented to person, place, and time. He has normal reflexes. No cranial nerve deficit. He exhibits normal muscle tone. Coordination normal.  Skin: Skin is warm and dry. No rash noted. He is not diaphoretic.  Psychiatric: He has a normal mood and affect. His behavior is normal. Judgment and thought content normal.    Filed Vitals:   12/05/13 0827  BP: 165/86  Pulse: 63  Temp: 97.9 F (36.6 C)    Resp: 16    Visual Acuity Screening   Right eye Left eye Both eyes  Without correction: 20/25 20/40 20/20   With correction:       Assessment & Plan:   1. Routine general medical examination at a health care facility   2. Pure hypercholesterolemia   3. Essential hypertension, benign   4. Other abnormal glucose   5. Screening for prostate cancer   6.  Need for prophylactic vaccination with Streptococcus pneumoniae (Pneumococcus) and Influenza vaccines    1. Annual Wellness Examination: anticipatory guidance provided-- exercise, weight loss.  Colonoscopy UTD. Immunizations UTD; s/p Prevnar 13. Obtain PSA.  Independent with ADLs; low fall risk. No evidence of depression.  No hearing loss.  Has living will and desires FULL CODE. 2.  Screening for prostate cancer: DRE and PSA obtained. 3.  HTN: moderately controlled; increase Lisinopril to 20mg  daily; obtain labs, u/a. 4.  Hypercholesterolemia: controlled; obtain labs; refill provided. 5. Glucose intolerance:  Stable; obtain labs; continue with dietary modification, weight loss, exercise. 6.  S/p Prevnar 13.  Meds ordered this encounter  Medications  . lisinopril (PRINIVIL,ZESTRIL) 20 MG tablet    Sig: Take 1 tablet (20 mg total) by mouth daily.    Dispense:  90 tablet    Refill:  3  . simvastatin (ZOCOR) 20 MG tablet    Sig: Take 1 tablet (20 mg total) by mouth every evening.    Dispense:  90 tablet    Refill:  3   I personally performed the services described in this documentation, which was scribed in my presence.  The recorded information has been reviewed and is accurate.   Reginia Forts, M.D.  [facility] [address] [place], [place]  [address] [phone] phone [phone] fax

## 2013-12-09 LAB — LIPID PANEL
CHOLESTEROL TOTAL: 149 mg/dL (ref 100–199)
Chol/HDL Ratio: 3.1 ratio units (ref 0.0–5.0)
HDL: 48 mg/dL (ref >39)
LDL Calculated: 74 mg/dL (ref 0–99)
Triglycerides: 135 mg/dL (ref 0–149)
VLDL Cholesterol Cal: 27 mg/dL (ref 5–40)

## 2013-12-09 LAB — CBC WITH DIFFERENTIAL/PLATELET
Basophils Absolute: 0 10*3/uL (ref 0.0–0.2)
Basos: 0 %
EOS ABS: 0.2 10*3/uL (ref 0.0–0.4)
Eos: 2 %
HCT: 43.6 % (ref 37.5–51.0)
Hemoglobin: 15.3 g/dL (ref 12.6–17.7)
IMMATURE GRANS (ABS): 0 10*3/uL (ref 0.0–0.1)
IMMATURE GRANULOCYTES: 0 %
LYMPHS: 37 %
Lymphocytes Absolute: 2.4 10*3/uL (ref 0.7–3.1)
MCH: 32.7 pg (ref 26.6–33.0)
MCHC: 35.1 g/dL (ref 31.5–35.7)
MCV: 93 fL (ref 79–97)
Monocytes Absolute: 0.6 10*3/uL (ref 0.1–0.9)
Monocytes: 8 %
NEUTROS PCT: 53 %
Neutrophils Absolute: 3.5 10*3/uL (ref 1.4–7.0)
RBC: 4.68 x10E6/uL (ref 4.14–5.80)
RDW: 12.9 % (ref 12.3–15.4)
WBC: 6.7 10*3/uL (ref 3.4–10.8)

## 2013-12-09 LAB — COMPREHENSIVE METABOLIC PANEL
ALBUMIN: 4.4 g/dL (ref 3.6–4.8)
ALT: 29 IU/L (ref 0–44)
AST: 25 IU/L (ref 0–40)
Albumin/Globulin Ratio: 1.6 (ref 1.1–2.5)
Alkaline Phosphatase: 39 IU/L (ref 39–117)
BUN/Creatinine Ratio: 9 — ABNORMAL LOW (ref 10–22)
BUN: 12 mg/dL (ref 8–27)
CO2: 26 mmol/L (ref 18–29)
CREATININE: 1.29 mg/dL — AB (ref 0.76–1.27)
Calcium: 9.7 mg/dL (ref 8.6–10.2)
Chloride: 100 mmol/L (ref 97–108)
GFR calc Af Amer: 66 mL/min/{1.73_m2} (ref >59)
GFR, EST NON AFRICAN AMERICAN: 57 mL/min/{1.73_m2} — AB (ref >59)
GLOBULIN, TOTAL: 2.7 g/dL (ref 1.5–4.5)
GLUCOSE: 97 mg/dL (ref 65–99)
Potassium: 4.9 mmol/L (ref 3.5–5.2)
Sodium: 142 mmol/L (ref 134–144)
TOTAL PROTEIN: 7.1 g/dL (ref 6.0–8.5)
Total Bilirubin: 1 mg/dL (ref 0.0–1.2)

## 2013-12-09 LAB — HEMOGLOBIN A1C
ESTIMATED AVERAGE GLUCOSE: 103 mg/dL
Hgb A1c MFr Bld: 5.2 % (ref 4.8–5.6)

## 2013-12-09 LAB — PSA: PSA: 0.3 ng/mL (ref 0.0–4.0)

## 2014-01-09 ENCOUNTER — Encounter: Payer: Self-pay | Admitting: Family Medicine

## 2014-06-05 ENCOUNTER — Encounter: Payer: Self-pay | Admitting: Family Medicine

## 2014-06-05 ENCOUNTER — Ambulatory Visit (INDEPENDENT_AMBULATORY_CARE_PROVIDER_SITE_OTHER): Payer: Medicare Other | Admitting: Family Medicine

## 2014-06-05 VITALS — BP 132/84 | HR 72 | Temp 98.0°F | Resp 16 | Ht 71.75 in | Wt 219.0 lb

## 2014-06-05 DIAGNOSIS — I10 Essential (primary) hypertension: Secondary | ICD-10-CM

## 2014-06-05 DIAGNOSIS — E7800 Pure hypercholesterolemia, unspecified: Secondary | ICD-10-CM

## 2014-06-05 DIAGNOSIS — R7309 Other abnormal glucose: Secondary | ICD-10-CM

## 2014-06-05 NOTE — Patient Instructions (Signed)

## 2014-06-05 NOTE — Progress Notes (Signed)
Subjective:    Patient ID: Ruben Garcia, male    DOB: [birth year], 69 y.o.   MRN: [id number]  06/05/2014  Hyperlipidemia and Hypertension   HPI This 69 y.o. male presents for six month follow-up:  1. HTN: Patient reports good compliance with medication, good tolerance to medication, and good symptom control.   Home BP running 130s/85-90s.  Walking 2.42miles daily.  Denies chest pain, palpitations, shortness of breath, leg swelling.  2.  Hypercholesterolemia:  Patient reports good compliance with medication, good tolerance to medication, and good symptom control.  Denies HA/dizziness/focal weakness/paresthesias.  3. Health maintenance: s/p flu vaccine; no problems with Prevnar 13.   Review of Systems  Constitutional: Negative for fever, chills, diaphoresis, activity change, appetite change and fatigue.  Respiratory: Negative for cough and shortness of breath.   Cardiovascular: Negative for chest pain, palpitations and leg swelling.  Gastrointestinal: Negative for nausea, vomiting, abdominal pain and diarrhea.  Endocrine: Negative for cold intolerance, heat intolerance, polydipsia, polyphagia and polyuria.  Skin: Negative for color change, rash and wound.  Neurological: Negative for dizziness, tremors, seizures, syncope, facial asymmetry, speech difficulty, weakness, light-headedness, numbness and headaches.  Psychiatric/Behavioral: Negative for sleep disturbance and dysphoric mood. The patient is not nervous/anxious.     Past Medical History  Diagnosis Date  . Hemorrhoids, internal   . Unspecified disorder of liver   . Other abnormal glucose   . Personal history of colonic polyps   . Personal history of other genital system and obstetric disorders(V13.29)   . Allergic rhinitis, cause unspecified   . Essential hypertension, benign   . Pure hypercholesterolemia   . Unspecified disorder of kidney and ureter   . Basal cell carcinoma of skin, site unspecified 04/28/2010    Nasal;  [name].   Past Surgical History  Procedure Laterality Date  . Hemorrhoid surgery      [facility]  . Gallbladder surgery  1984  . Basal cell carcinoma excision  2012    Facial        [facility]  . Ear surgery  05/2012    [name].  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  Benign.  . Colonoscopy  11/13/2010    single polyp; repeatin 5 years; [place]  . Cholecystectomy     Allergies  Allergen Reactions  . Codeine Nausea Only   Current Outpatient Prescriptions  Medication Sig Dispense Refill  . aspirin 81 MG tablet Take 81 mg by mouth daily.    . Calcium Carbonate-Vitamin D (CALCIUM 600+D) 600-200 MG-UNIT TABS Take 1 tablet by mouth daily.    . cholecalciferol (VITAMIN D) 400 UNITS TABS Take 400 Units by mouth daily.    . cyanocobalamin 100 MCG tablet Take 100 mcg by mouth daily.    [name] Kitchen lisinopril (PRINIVIL,ZESTRIL) 20 MG tablet Take 1 tablet (20 mg total) by mouth daily. 90 tablet 3  . Multiple Vitamins-Minerals (MULTIVITAMIN PO) Take by mouth daily.    . Omega-3 Fatty Acids (FISH OIL CONCENTRATE) 1000 MG CAPS Take 1 capsule by mouth daily.    . simvastatin (ZOCOR) 20 MG tablet Take 1 tablet (20 mg total) by mouth every evening. 90 tablet 3  . vitamin C (ASCORBIC ACID) 500 MG tablet Take 500 mg by mouth daily.    . vitamin E 400 UNIT capsule Take 400 Units by mouth daily.     No current facility-administered medications for this visit.       Objective:    BP 132/84 mmHg  Pulse 72  Temp(Src) 98 F (36.7 C) (  Oral)  Resp 16  Ht 5' 11.75" (1.822 m)  Wt 219 lb (99.338 kg)  BMI 29.92 kg/m2  SpO2 97% Physical Exam  Constitutional: He is oriented to person, place, and time. He appears well-developed and well-nourished. No distress.  HENT:  Head: Normocephalic and atraumatic.  Right Ear: External ear normal.  Left Ear: External ear normal.  Nose: Nose normal.  Mouth/Throat: Oropharynx is clear and moist.  Eyes: Conjunctivae and EOM are normal. Pupils are equal, round, and reactive to light.  Neck:  Normal range of motion. Neck supple. Carotid bruit is not present. No thyromegaly present.  Cardiovascular: Normal rate, regular rhythm, normal heart sounds and intact distal pulses.  Exam reveals no gallop and no friction rub.   No murmur heard. Pulmonary/Chest: Effort normal and breath sounds normal. He has no wheezes. He has no rales.  Abdominal: Soft. Bowel sounds are normal. He exhibits no distension and no mass. There is no tenderness. There is no rebound and no guarding.  Lymphadenopathy:    He has no cervical adenopathy.  Neurological: He is alert and oriented to person, place, and time. No cranial nerve deficit.  Skin: Skin is warm and dry. No rash noted. He is not diaphoretic.  Psychiatric: He has a normal mood and affect. His behavior is normal.  Nursing note and vitals reviewed.  Results for orders placed or performed in visit on 12/05/13  CBC with Differential  Result Value Ref Range   WBC 6.7 3.4 - 10.8 x10E3/uL   RBC 4.68 4.14 - 5.80 x10E6/uL   Hemoglobin 15.3 12.6 - 17.7 g/dL   HCT 43.6 37.5 - 51.0 %   MCV 93 79 - 97 fL   MCH 32.7 26.6 - 33.0 pg   MCHC 35.1 31.5 - 35.7 g/dL   RDW 12.9 12.3 - 15.4 %   Neutrophils Relative % 53 %   Lymphs 37 %   Monocytes 8 %   Eos 2 %   Basos 0 %   Neutrophils Absolute 3.5 1.4 - 7.0 x10E3/uL   Lymphocytes Absolute 2.4 0.7 - 3.1 x10E3/uL   Monocytes Absolute 0.6 0.1 - 0.9 x10E3/uL   Eosinophils Absolute 0.2 0.0 - 0.4 x10E3/uL   Basophils Absolute 0.0 0.0 - 0.2 x10E3/uL   Immature Granulocytes 0 %   Immature Grans (Abs) 0.0 0.0 - 0.1 x10E3/uL  Lipid panel  Result Value Ref Range   Cholesterol, Total 149 100 - 199 mg/dL   Triglycerides 135 0 - 149 mg/dL   HDL 48 >39 mg/dL   VLDL Cholesterol Cal 27 5 - 40 mg/dL   LDL Calculated 74 0 - 99 mg/dL   Chol/HDL Ratio 3.1 0.0 - 5.0 ratio units  Hemoglobin A1c  Result Value Ref Range   Hgb A1c MFr Bld 5.2 4.8 - 5.6 %   Est. average glucose Bld gHb Est-mCnc 103 mg/dL  Comprehensive  metabolic panel  Result Value Ref Range   Glucose 97 65 - 99 mg/dL   BUN 12 8 - 27 mg/dL   Creatinine, Ser 1.29 (H) 0.76 - 1.27 mg/dL   GFR calc non Af Amer 57 (L) >59 mL/min/1.73   GFR calc Af Amer 66 >59 mL/min/1.73   BUN/Creatinine Ratio 9 (L) 10 - 22   Sodium 142 134 - 144 mmol/L   Potassium 4.9 3.5 - 5.2 mmol/L   Chloride 100 97 - 108 mmol/L   CO2 26 18 - 29 mmol/L   Calcium 9.7 8.6 - 10.2 mg/dL   Total Protein  7.1 6.0 - 8.5 g/dL   Albumin 4.4 3.6 - 4.8 g/dL   Globulin, Total 2.7 1.5 - 4.5 g/dL   Albumin/Globulin Ratio 1.6 1.1 - 2.5   Total Bilirubin 1.0 0.0 - 1.2 mg/dL   Alkaline Phosphatase 39 39 - 117 IU/L   AST 25 0 - 40 IU/L   ALT 29 0 - 44 IU/L  PSA  Result Value Ref Range   PSA 0.3 0.0 - 4.0 ng/mL  POCT urinalysis dipstick  Result Value Ref Range   Color, UA yellow    Clarity, UA clear    Glucose, UA neg    Bilirubin, UA neg    Ketones, UA neg    Spec Grav, UA <=1.005    Blood, UA neg    pH, UA 6.0    Protein, UA neg    Urobilinogen, UA 0.2    Nitrite, UA neg    Leukocytes, UA Negative        Assessment & Plan:   1. Essential hypertension, benign   2. Pure hypercholesterolemia   3. Abnormal glucose     1. HTN: controlled; obtain labs; continue current medication; follow-up six months. 2.  Hyperlipidemia: controlled; obtain labs; continue current medications. 3. Glucose intolerance: improved; normal HgbA1c at last visit.   No orders of the defined types were placed in this encounter.    Return in about 6 months (around 12/04/2014) for complete physical examiniation.    Altovise Wahler Elayne Guerin, M.D. [facility] [address] [place], [place]  [address] [phone] phone [phone] fax

## 2014-06-08 ENCOUNTER — Other Ambulatory Visit: Payer: Self-pay | Admitting: Family Medicine

## 2014-06-09 LAB — COMPREHENSIVE METABOLIC PANEL
ALK PHOS: 40 IU/L (ref 39–117)
ALT: 44 IU/L (ref 0–44)
AST: 35 IU/L (ref 0–40)
Albumin/Globulin Ratio: 1.8 (ref 1.1–2.5)
Albumin: 4.4 g/dL (ref 3.6–4.8)
BUN/Creatinine Ratio: 9 — ABNORMAL LOW (ref 10–22)
BUN: 12 mg/dL (ref 8–27)
Bilirubin Total: 0.9 mg/dL (ref 0.0–1.2)
CALCIUM: 10 mg/dL (ref 8.6–10.2)
CO2: 24 mmol/L (ref 18–29)
CREATININE: 1.28 mg/dL — AB (ref 0.76–1.27)
Chloride: 103 mmol/L (ref 97–108)
GFR calc Af Amer: 67 mL/min/{1.73_m2} (ref >59)
GFR calc non Af Amer: 58 mL/min/{1.73_m2} — ABNORMAL LOW (ref >59)
GLOBULIN, TOTAL: 2.4 g/dL (ref 1.5–4.5)
Glucose: 108 mg/dL — ABNORMAL HIGH (ref 65–99)
Potassium: 5.4 mmol/L — ABNORMAL HIGH (ref 3.5–5.2)
SODIUM: 142 mmol/L (ref 134–144)
TOTAL PROTEIN: 6.8 g/dL (ref 6.0–8.5)

## 2014-06-09 LAB — CBC
HEMATOCRIT: 44.7 % (ref 37.5–51.0)
HEMOGLOBIN: 15.7 g/dL (ref 12.6–17.7)
MCH: 32.8 pg (ref 26.6–33.0)
MCHC: 35.1 g/dL (ref 31.5–35.7)
MCV: 93 fL (ref 79–97)
Platelets: 254 10*3/uL (ref 150–379)
RBC: 4.79 x10E6/uL (ref 4.14–5.80)
RDW: 12.8 % (ref 12.3–15.4)
WBC: 7.7 10*3/uL (ref 3.4–10.8)

## 2014-06-09 LAB — LIPID PANEL
CHOL/HDL RATIO: 3.4 ratio (ref 0.0–5.0)
Cholesterol, Total: 165 mg/dL (ref 100–199)
HDL: 48 mg/dL (ref >39)
LDL CALC: 92 mg/dL (ref 0–99)
Triglycerides: 125 mg/dL (ref 0–149)
VLDL Cholesterol Cal: 25 mg/dL (ref 5–40)

## 2014-06-09 LAB — HEMOGLOBIN A1C
ESTIMATED AVERAGE GLUCOSE: 105 mg/dL
HEMOGLOBIN A1C: 5.3 % (ref 4.8–5.6)

## 2014-11-28 ENCOUNTER — Other Ambulatory Visit: Payer: Self-pay | Admitting: Family Medicine

## 2014-12-11 ENCOUNTER — Encounter: Payer: Self-pay | Admitting: Family Medicine

## 2014-12-11 ENCOUNTER — Ambulatory Visit (INDEPENDENT_AMBULATORY_CARE_PROVIDER_SITE_OTHER): Payer: Medicare Other | Admitting: Family Medicine

## 2014-12-11 VITALS — BP 159/96 | HR 65 | Temp 97.5°F | Resp 16 | Ht 71.75 in | Wt 221.8 lb

## 2014-12-11 DIAGNOSIS — E7800 Pure hypercholesterolemia, unspecified: Secondary | ICD-10-CM

## 2014-12-11 DIAGNOSIS — Z0000 Encounter for general adult medical examination without abnormal findings: Secondary | ICD-10-CM | POA: Diagnosis not present

## 2014-12-11 DIAGNOSIS — Z85828 Personal history of other malignant neoplasm of skin: Secondary | ICD-10-CM | POA: Insufficient documentation

## 2014-12-11 DIAGNOSIS — Z125 Encounter for screening for malignant neoplasm of prostate: Secondary | ICD-10-CM | POA: Diagnosis not present

## 2014-12-11 DIAGNOSIS — I10 Essential (primary) hypertension: Secondary | ICD-10-CM | POA: Diagnosis not present

## 2014-12-11 DIAGNOSIS — R7302 Impaired glucose tolerance (oral): Secondary | ICD-10-CM

## 2014-12-11 LAB — POCT URINALYSIS DIPSTICK
Bilirubin, UA: NEGATIVE
Glucose, UA: NEGATIVE
KETONES UA: NEGATIVE
Leukocytes, UA: NEGATIVE
Nitrite, UA: NEGATIVE
PROTEIN UA: NEGATIVE
RBC UA: NEGATIVE
UROBILINOGEN UA: 0.2
pH, UA: 5.5

## 2014-12-11 MED ORDER — SIMVASTATIN 20 MG PO TABS: ORAL_TABLET | ORAL | Status: DC

## 2014-12-11 MED ORDER — LISINOPRIL 20 MG PO TABS
ORAL_TABLET | ORAL | Status: DC
Start: 2014-12-11 — End: 2015-12-11

## 2014-12-11 NOTE — Progress Notes (Signed)
Subjective:    Patient ID: Ruben Garcia, male    DOB: [birth year], 69 y.o.   MRN: [id number]  HPI This 69 y.o. male presents for Annual Wellness Examination.  Last physical:  12-05-2013 Colonoscopy: 2012; repeat in 5 years.  [facility].  +colon polyps single TDAP:  2010 Pneumovax:  2015 Prevnar-13; previous Pneumovax at pharmacy in 2015.  Zostavax:  2012 Influenza:  2015 Eye exam:  [facility] 2015; no glaucoma or cataracts.  Reading glasses. Dental exam:  Every six months.   HTN: Patient reports good compliance with medication, good tolerance to medication, and good symptom control.  Home BP elevated today 120/80, 144/90.  Unusual high BP.  Fluctuates.  867/61-950/93.  Hypercholesterolemia: Patient reports good compliance with medication, good tolerance to medication, and good symptom control.    Glucose Intolerance: watching diet closely; needs to lose weight but struggling.    BPH:  Prostate exam by urology/[facility] every November.  Basal cell carcinoma: several years ago was last dermatology evaluation.  [name]; retired.  Last visit 4 years.       Review of Systems  Constitutional: Negative.  Negative for fever, chills, diaphoresis, activity change, appetite change, fatigue and unexpected weight change.  HENT: Negative.  Negative for congestion, dental problem, drooling, ear discharge, ear pain, facial swelling, hearing loss, mouth sores, nosebleeds, postnasal drip, rhinorrhea, sinus pressure, sneezing, sore throat, tinnitus, trouble swallowing and voice change.   Eyes: Negative.  Negative for photophobia, pain, discharge, redness, itching and visual disturbance.  Respiratory: Negative.  Negative for apnea, cough, choking, chest tightness, shortness of breath, wheezing and stridor.   Cardiovascular: Negative.  Negative for chest pain, palpitations and leg swelling.  Gastrointestinal: Negative.  Negative for nausea, vomiting, abdominal pain, diarrhea, constipation and blood in stool.    Endocrine: Negative.  Negative for cold intolerance, heat intolerance, polydipsia, polyphagia and polyuria.  Genitourinary: Negative.  Negative for dysuria, urgency, frequency, hematuria, flank pain, decreased urine volume, discharge, penile swelling, scrotal swelling, enuresis, difficulty urinating, genital sores, penile pain and testicular pain.  Musculoskeletal: Positive for back pain and arthralgias. Negative for myalgias, joint swelling, gait problem, neck pain and neck stiffness.  Skin: Negative.  Negative for color change, pallor, rash and wound.  Allergic/Immunologic: Negative.  Negative for environmental allergies, food allergies and immunocompromised state.  Neurological: Negative.  Negative for dizziness, tremors, seizures, syncope, facial asymmetry, speech difficulty, weakness, light-headedness, numbness and headaches.  Hematological: Negative.  Negative for adenopathy. Does not bruise/bleed easily.  Psychiatric/Behavioral: Negative.  Negative for suicidal ideas, hallucinations, behavioral problems, confusion, sleep disturbance, self-injury, dysphoric mood, decreased concentration and agitation. The patient is not nervous/anxious and is not hyperactive.    Past Medical History  Diagnosis Date  . Hemorrhoids, internal   . Unspecified disorder of liver   . Other abnormal glucose   . Personal history of colonic polyps   . Personal history of other genital system and obstetric disorders(V13.29)   . Allergic rhinitis, cause unspecified   . Essential hypertension, benign   . Pure hypercholesterolemia   . Unspecified disorder of kidney and ureter   . Basal cell carcinoma of skin, site unspecified 04/28/2010    Nasal; [name].   Past Surgical History  Procedure Laterality Date  . Hemorrhoid surgery      Merita Hawks  . Gallbladder surgery  1984  . Basal cell carcinoma excision  2012    Facial        [facility]  . Ear surgery  05/2012    [name].  Warty  growth in ear.  Benign.  .  Colonoscopy  11/13/2010    single polyp; repeatin 5 years; [place]  . Cholecystectomy     Allergies  Allergen Reactions  . Codeine Nausea Only   Social History   Social History  . Marital Status: Married    Spouse Name: N/A  . Number of Children: 2  . Years of Education: college   Occupational History  . retired     Actor in 2005   Social History Main Topics  . Smoking status: Never Smoker   . Smokeless tobacco: Not on file  . Alcohol Use: Yes     Comment: occasional weekends beer, 4- 5 total Miller Light  . Drug Use: No  . Sexual Activity: Yes   Other Topics Concern  . Not on file   Social History Narrative   Always uses seat belts. Smoke alarm and carbon monoxide detector in the home. Guns in the home stored in locked cabinet.Caffeine use: Coffee 2 servings per day, moderate amount. Exercise: Moderate walking 2 - 4 miles daily, 2 - 3 days per week.    Married x 45 years, happily.      Children:  2 children; 1 grandchild.      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      Tobacco: never      Alcohol:  Weekends; beer 4-6 per weekend.      Exercise:  Walking every other day 2.54miles.      Seatbelt: 100%      Guns: secured guns.      Living Will: completed in 2014.  FULL CODE but no prolonged measures.     Family History  Problem Relation Age of Onset  . Heart disease Mother   . Diabetes Mother   . Hyperlipidemia Mother   . Arthritis Mother     rheumatoid  . Cancer Father     lung  . Diabetes Father   . Arthritis Sister   . Hyperlipidemia Sister   . Hyperlipidemia Sister   . Hypertension Sister   . Hyperlipidemia Sister        Objective:   Physical Exam  Constitutional: He is oriented to person, place, and time. He appears well-developed and well-nourished. No distress.  HENT:  Head: Normocephalic and atraumatic.  Right Ear: External ear normal.  Left Ear: External ear normal.  Nose: Nose normal.  Mouth/Throat: Oropharynx is  clear and moist.  Eyes: Conjunctivae and EOM are normal. Pupils are equal, round, and reactive to light.  Neck: Normal range of motion. Neck supple. Carotid bruit is not present. No thyromegaly present.  Cardiovascular: Normal rate, regular rhythm, normal heart sounds and intact distal pulses.  Exam reveals no gallop and no friction rub.   No murmur heard. Pulmonary/Chest: Effort normal and breath sounds normal. He has no wheezes. He has no rales.  Abdominal: Soft. Bowel sounds are normal. He exhibits no distension and no mass. There is no tenderness. There is no rebound and no guarding.  Musculoskeletal:       Right shoulder: Normal.       Left shoulder: Normal.       Cervical back: Normal.  Lymphadenopathy:    He has no cervical adenopathy.  Neurological: He is alert and oriented to person, place, and time. He has normal reflexes. No cranial nerve deficit. He exhibits normal muscle tone. Coordination normal.  Skin: Skin is warm and dry. No rash noted. He is not diaphoretic.  Psychiatric: He has a normal mood and affect. His behavior is normal. Judgment and thought content normal.    Results for orders placed or performed in visit on 12/11/14  POCT urinalysis dipstick  Result Value Ref Range   Color, UA yellow    Clarity, UA clear    Glucose, UA neg    Bilirubin, UA neg    Ketones, UA neg    Spec Grav, UA <=1.005    Blood, UA neg    pH, UA 5.5    Protein, UA neg    Urobilinogen, UA 0.2    Nitrite, UA neg    Leukocytes, UA Negative Negative       Assessment & Plan:  Encounter for Medicare annual wellness exam  Pure hypercholesterolemia - Plan: simvastatin (ZOCOR) 20 MG tablet, CBC with Differential/Platelet, Comprehensive metabolic panel, Lipid panel  Essential hypertension, benign - Plan: lisinopril (PRINIVIL,ZESTRIL) 20 MG tablet, POCT urinalysis dipstick, CBC with Differential/Platelet, Comprehensive metabolic panel  Glucose intolerance (impaired glucose tolerance) -  Plan: Hemoglobin A1c  Encounter for prostate cancer screening - Plan: POCT urinalysis dipstick, PSA, Medicare   1. Annual Wellness Examination: anticipatory guidance --- weight loss, exercise.  Colonoscopy UTD.  Immunizations UTD; will warrant Pneumovax at next visit.  Independent with ADLs. Has living will and desires FULL CODE.  No evidence of depression.  Low fall risk.  No urinary incontinence.  No hearing loss. 2.  Hypercholesterolemia: controlled; obtain labs; refills provided. 3.  HTN: uncontrolled today; monitor BP daily for next week; call office if BP> 140/90.  Refill of current medication provided. 4.  Glucose Intolerance:  Stable; obtain labs; continue with dietary modification; recommend weight loss and exercise. 5.  Prostate cancer screening: obtain PSA; followed by urology annually in November.  6. Basal cell carcinoma nasal: stable; s/p surgical resection; recommend follow-up with dermatology this year.   Meds ordered this encounter  Medications  . lisinopril (PRINIVIL,ZESTRIL) 20 MG tablet    Sig: TAKE ONE TABLET BY MOUTH ONCE DAILY    Dispense:  90 tablet    Refill:  3  . simvastatin (ZOCOR) 20 MG tablet    Sig: TAKE ONE TABLET BY MOUTH IN THE EVENING    Dispense:  90 tablet    Refill:  3    Norwood Levo, M.D. [facility] [address] [place],   [address] [phone] phone [phone] fax

## 2014-12-11 NOTE — Patient Instructions (Signed)
[email or web address].matthews.info/  Keeping you healthy  Get these tests  Blood pressure- Have your blood pressure checked once a year by your healthcare provider.  Normal blood pressure is 120/80  Weight- Have your body mass index (BMI) calculated to screen for obesity.  BMI is a measure of body fat based on height and weight. You can also calculate your own BMI at ViewBanking.si.  Cholesterol- Have your cholesterol checked every year.  Diabetes- Have your blood sugar checked regularly if you have high blood pressure, high cholesterol, have a family history of diabetes or if you are overweight.  Screening for Colon Cancer- Colonoscopy starting at age 65.  Screening may begin sooner depending on your family history and other health conditions. Follow up colonoscopy as directed by your Gastroenterologist.  Screening for Prostate Cancer- Both blood work (PSA) and a rectal exam help screen for Prostate Cancer.  Screening begins at age 35 with African-American men and at age 62 with Caucasian men.  Screening may begin sooner depending on your family history.  Take these medicines  Aspirin- One aspirin daily can help prevent Heart disease and Stroke.  Flu shot- Every fall.  Tetanus- Every 10 years.  Zostavax- Once after the age of 25 to prevent Shingles.  Pneumonia shot- Once after the age of 76; if you are younger than 57, ask your healthcare provider if you need a Pneumonia shot.  Take these steps  Don't smoke- If you do smoke, talk to your doctor about quitting.  For tips on how to quit, go to www.smokefree.gov or call 1-800-QUIT-NOW.  Be physically active- Exercise 5 days a week for at least 30 minutes.  If you are not already physically active start slow and gradually work up to 30 minutes of moderate physical activity.  Examples of moderate activity include walking briskly, mowing the yard, dancing, swimming, bicycling, etc.  Eat a healthy diet- Eat a variety of healthy food such as  fruits, vegetables, low fat milk, low fat cheese, yogurt, lean meant, poultry, fish, beans, tofu, etc. For more information go to www.thenutritionsource.org  Drink alcohol in moderation- Limit alcohol intake to less than two drinks a day. Never drink and drive.  Dentist- Brush and floss twice daily; visit your dentist twice a year.  Depression- Your emotional health is as important as your physical health. If you're feeling down, or losing interest in things you would normally enjoy please talk to your healthcare provider.  Eye exam- Visit your eye doctor every year.  Safe sex- If you may be exposed to a sexually transmitted infection, use a condom.  Seat belts- Seat belts can save your life; always wear one.  Smoke/Carbon Monoxide detectors- These detectors need to be installed on the appropriate level of your home.  Replace batteries at least once a year.  Skin cancer- When out in the sun, cover up and use sunscreen 15 SPF or higher.  Violence- If anyone is threatening you, please tell your healthcare provider. Living Will/ Health care power of attorney- Speak with your healthcare provider and family.

## 2014-12-15 LAB — CBC WITH DIFFERENTIAL/PLATELET
BASOS ABS: 0 10*3/uL (ref 0.0–0.2)
Basos: 0 %
EOS (ABSOLUTE): 0.1 10*3/uL (ref 0.0–0.4)
EOS: 2 %
HEMATOCRIT: 44.6 % (ref 37.5–51.0)
HEMOGLOBIN: 15.3 g/dL (ref 12.6–17.7)
Immature Grans (Abs): 0 10*3/uL (ref 0.0–0.1)
Immature Granulocytes: 0 %
LYMPHS ABS: 2.8 10*3/uL (ref 0.7–3.1)
Lymphs: 36 %
MCH: 32 pg (ref 26.6–33.0)
MCHC: 34.3 g/dL (ref 31.5–35.7)
MCV: 93 fL (ref 79–97)
MONOCYTES: 7 %
MONOS ABS: 0.5 10*3/uL (ref 0.1–0.9)
NEUTROS ABS: 4.3 10*3/uL (ref 1.4–7.0)
Neutrophils: 55 %
Platelets: 268 10*3/uL (ref 150–379)
RBC: 4.78 x10E6/uL (ref 4.14–5.80)
RDW: 12.9 % (ref 12.3–15.4)
WBC: 7.8 10*3/uL (ref 3.4–10.8)

## 2014-12-15 LAB — LIPID PANEL
CHOLESTEROL TOTAL: 144 mg/dL (ref 100–199)
Chol/HDL Ratio: 3.5 ratio units (ref 0.0–5.0)
HDL: 41 mg/dL (ref >39)
LDL Calculated: 70 mg/dL (ref 0–99)
Triglycerides: 167 mg/dL — ABNORMAL HIGH (ref 0–149)
VLDL Cholesterol Cal: 33 mg/dL (ref 5–40)

## 2014-12-15 LAB — COMPREHENSIVE METABOLIC PANEL
ALBUMIN: 4.8 g/dL (ref 3.6–4.8)
ALK PHOS: 36 IU/L — AB (ref 39–117)
ALT: 35 IU/L (ref 0–44)
AST: 26 IU/L (ref 0–40)
Albumin/Globulin Ratio: 2.1 (ref 1.1–2.5)
BILIRUBIN TOTAL: 1.3 mg/dL — AB (ref 0.0–1.2)
BUN / CREAT RATIO: 9 — AB (ref 10–22)
BUN: 12 mg/dL (ref 8–27)
CHLORIDE: 101 mmol/L (ref 97–108)
CO2: 25 mmol/L (ref 18–29)
Calcium: 10 mg/dL (ref 8.6–10.2)
Creatinine, Ser: 1.27 mg/dL (ref 0.76–1.27)
GFR calc Af Amer: 67 mL/min/{1.73_m2} (ref >59)
GFR calc non Af Amer: 58 mL/min/{1.73_m2} — ABNORMAL LOW (ref >59)
GLOBULIN, TOTAL: 2.3 g/dL (ref 1.5–4.5)
Glucose: 107 mg/dL — ABNORMAL HIGH (ref 65–99)
POTASSIUM: 5.3 mmol/L — AB (ref 3.5–5.2)
SODIUM: 142 mmol/L (ref 134–144)
Total Protein: 7.1 g/dL (ref 6.0–8.5)

## 2014-12-15 LAB — HEMOGLOBIN A1C
ESTIMATED AVERAGE GLUCOSE: 108 mg/dL
Hgb A1c MFr Bld: 5.4 % (ref 4.8–5.6)

## 2015-03-09 ENCOUNTER — Encounter: Payer: Self-pay | Admitting: *Deleted

## 2015-03-16 ENCOUNTER — Ambulatory Visit: Payer: Self-pay | Admitting: Urology

## 2015-06-05 ENCOUNTER — Ambulatory Visit (INDEPENDENT_AMBULATORY_CARE_PROVIDER_SITE_OTHER): Payer: Medicare Other | Admitting: Family Medicine

## 2015-06-05 ENCOUNTER — Encounter: Payer: Self-pay | Admitting: Family Medicine

## 2015-06-05 VITALS — BP 135/84 | HR 96 | Temp 97.9°F | Resp 16 | Ht 71.75 in | Wt 215.4 lb

## 2015-06-05 DIAGNOSIS — J069 Acute upper respiratory infection, unspecified: Secondary | ICD-10-CM | POA: Diagnosis not present

## 2015-06-05 DIAGNOSIS — E7800 Pure hypercholesterolemia, unspecified: Secondary | ICD-10-CM

## 2015-06-05 DIAGNOSIS — I10 Essential (primary) hypertension: Secondary | ICD-10-CM

## 2015-06-05 DIAGNOSIS — R7309 Other abnormal glucose: Secondary | ICD-10-CM | POA: Diagnosis not present

## 2015-06-05 DIAGNOSIS — Z23 Encounter for immunization: Secondary | ICD-10-CM | POA: Diagnosis not present

## 2015-06-05 DIAGNOSIS — Z85828 Personal history of other malignant neoplasm of skin: Secondary | ICD-10-CM

## 2015-06-05 DIAGNOSIS — Z125 Encounter for screening for malignant neoplasm of prostate: Secondary | ICD-10-CM | POA: Diagnosis not present

## 2015-06-05 MED ORDER — IPRATROPIUM BROMIDE 0.03 % NA SOLN: 2.0000 | Freq: Two times a day (BID) | NASAL | Status: DC

## 2015-06-05 NOTE — Progress Notes (Signed)
Subjective:    Patient ID: Ruben Garcia, male    DOB: [birth year], 70 y.o.   MRN: [id number]  06/05/2015  Follow-up   HPI This 70 y.o. male presents for six month follow-up:   1. HTN: Patient reports good compliance with medication, good tolerance to medication, and good symptom control.   Home BP varies 117/75-160/90.      2. Hyperlipidemia: Patient reports good compliance with medication, good tolerance to medication, and good symptom control.    3.  Glucose intolerance:   4.  Prostate cancer screening:  Requesting PSA.  5.  Cough: onset two days ago.  No fever/chills/sweats.  No sore throat or ear pain.  +rhinorrhea; +nasal congestion. +PND; +coughing deep; +sputum white.  No SOB.  No malaise.  No medications.  Keeping up at night.     Review of Systems  Constitutional: Negative for fever, chills, diaphoresis, activity change, appetite change and fatigue.  Respiratory: Negative for cough and shortness of breath.   Cardiovascular: Negative for chest pain, palpitations and leg swelling.  Gastrointestinal: Negative for nausea, vomiting, abdominal pain and diarrhea.  Endocrine: Negative for cold intolerance, heat intolerance, polydipsia, polyphagia and polyuria.  Skin: Negative for color change, rash and wound.  Neurological: Negative for dizziness, tremors, seizures, syncope, facial asymmetry, speech difficulty, weakness, light-headedness, numbness and headaches.  Psychiatric/Behavioral: Negative for sleep disturbance and dysphoric mood. The patient is not nervous/anxious.     Past Medical History  Diagnosis Date  . Hemorrhoids, internal   . Unspecified disorder of liver   . Other abnormal glucose   . Personal history of colonic polyps   . Personal history of other genital system and obstetric disorders(V13.29)   . Allergic rhinitis, cause unspecified   . Essential hypertension, benign   . Pure hypercholesterolemia   . Unspecified disorder of kidney and ureter   . Basal  cell carcinoma of skin, site unspecified 04/28/2010    Nasal; [name].  . Incomplete bladder emptying   . BPH (benign prostatic hypertrophy)   . Over weight    Past Surgical History  Procedure Laterality Date  . Hemorrhoid surgery      Jahzir Strohmeier  . Gallbladder surgery  1984  . Basal cell carcinoma excision  2012    Facial        [facility]  . Ear surgery  05/2012    [name].  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  Benign.  . Colonoscopy  11/13/2010    single polyp; repeatin 5 years; [place]  . Cholecystectomy    . Vasectomy     Allergies  Allergen Reactions  . Codeine Nausea Only   Current Outpatient Prescriptions  Medication Sig Dispense Refill  . aspirin 81 MG tablet Take 81 mg by mouth daily.    . Calcium Carbonate-Vitamin D (CALCIUM 600+D) 600-200 MG-UNIT TABS Take 1 tablet by mouth daily.    . cholecalciferol (VITAMIN D) 400 UNITS TABS Take 400 Units by mouth daily.    . cyanocobalamin 100 MCG tablet Take 100 mcg by mouth daily.    [name] Kitchen lisinopril (PRINIVIL,ZESTRIL) 20 MG tablet TAKE ONE TABLET BY MOUTH ONCE DAILY 90 tablet 3  . Multiple Vitamins-Minerals (MULTIVITAMIN PO) Take by mouth daily.    . Omega-3 Fatty Acids (FISH OIL CONCENTRATE) 1000 MG CAPS Take 1 capsule by mouth daily.    . simvastatin (ZOCOR) 20 MG tablet TAKE ONE TABLET BY MOUTH IN THE EVENING 90 tablet 3  . vitamin C (ASCORBIC ACID) 500 MG tablet Take 500 mg by  mouth daily.    . vitamin E 400 UNIT capsule Take 400 Units by mouth daily.     No current facility-administered medications for this visit.   Social History   Social History  . Marital Status: Married    Spouse Name: N/A  . Number of Children: 2  . Years of Education: college   Occupational History  . retired     Actor in 2005   Social History Main Topics  . Smoking status: Never Smoker   . Smokeless tobacco: Not on file  . Alcohol Use: Yes     Comment: occasional weekends beer, 4- 5 total Miller Light  . Drug Use: No  . Sexual Activity: Yes    Other Topics Concern  . Not on file   Social History Narrative   Always uses seat belts. Smoke alarm and carbon monoxide detector in the home. Guns in the home stored in locked cabinet.Caffeine use: Coffee 2 servings per day, moderate amount. Exercise: Moderate walking 2 - 4 miles daily, 2 - 3 days per week.    Marital status:  Married x 46 years, happily.      Children:  2 children; 1 grandchild (19).      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      Tobacco: never      Alcohol:  Weekends; beer 4-6 per weekend.      Exercise:  Walking every other day 2.0 miles three times per day.      Seatbelt: 100%      Guns: secured guns.      Living Will: completed in 2014.  FULL CODE but no prolonged measures.        ADLs: independent with ADLs; no assistant devices   Family History  Problem Relation Age of Onset  . Heart disease Mother   . Diabetes Mother   . Hyperlipidemia Mother   . Arthritis Mother     rheumatoid  . Cancer Father     lung  . Diabetes Father   . Arthritis Sister   . Hyperlipidemia Sister   . Hyperlipidemia Sister   . Hypertension Sister   . Hyperlipidemia Sister        Objective:    BP 135/84 mmHg  Pulse 96  Temp(Src) 97.9 F (36.6 C) (Oral)  Resp 16  Ht 5' 11.75" (1.822 m)  Wt 215 lb 6.4 oz (97.705 kg)  BMI 29.43 kg/m2  SpO2 97% Physical Exam  Constitutional: He is oriented to person, place, and time. He appears well-developed and well-nourished. No distress.  HENT:  Head: Normocephalic and atraumatic.  Right Ear: External ear normal.  Left Ear: External ear normal.  Nose: Nose normal.  Mouth/Throat: Oropharynx is clear and moist.  Eyes: Conjunctivae and EOM are normal. Pupils are equal, round, and reactive to light.  Neck: Normal range of motion. Neck supple. Carotid bruit is not present. No thyromegaly present.  Cardiovascular: Normal rate, regular rhythm, normal heart sounds and intact distal pulses.  Exam reveals no gallop and  no friction rub.   No murmur heard. Pulmonary/Chest: Effort normal and breath sounds normal. He has no wheezes. He has no rales.  Abdominal: Soft. Bowel sounds are normal. He exhibits no distension and no mass. There is no tenderness. There is no rebound and no guarding.  Lymphadenopathy:    He has no cervical adenopathy.  Neurological: He is alert and oriented to person, place, and time. No cranial nerve deficit.  Skin:  Skin is warm and dry. No rash noted. He is not diaphoretic.  Psychiatric: He has a normal mood and affect. His behavior is normal.  Nursing note and vitals reviewed.  Results for orders placed or performed in visit on 12/11/14  CBC with Differential/Platelet  Result Value Ref Range   WBC 7.8 3.4 - 10.8 x10E3/uL   RBC 4.78 4.14 - 5.80 x10E6/uL   Hemoglobin 15.3 12.6 - 17.7 g/dL   Hematocrit 44.6 37.5 - 51.0 %   MCV 93 79 - 97 fL   MCH 32.0 26.6 - 33.0 pg   MCHC 34.3 31.5 - 35.7 g/dL   RDW 12.9 12.3 - 15.4 %   Platelets 268 150 - 379 x10E3/uL   Neutrophils 55 %   Lymphs 36 %   Monocytes 7 %   Eos 2 %   Basos 0 %   Neutrophils Absolute 4.3 1.4 - 7.0 x10E3/uL   Lymphocytes Absolute 2.8 0.7 - 3.1 x10E3/uL   Monocytes Absolute 0.5 0.1 - 0.9 x10E3/uL   EOS (ABSOLUTE) 0.1 0.0 - 0.4 x10E3/uL   Basophils Absolute 0.0 0.0 - 0.2 x10E3/uL   Immature Granulocytes 0 %   Immature Grans (Abs) 0.0 0.0 - 0.1 x10E3/uL  Comprehensive metabolic panel  Result Value Ref Range   Glucose 107 (H) 65 - 99 mg/dL   BUN 12 8 - 27 mg/dL   Creatinine, Ser 1.27 0.76 - 1.27 mg/dL   GFR calc non Af Amer 58 (L) >59 mL/min/1.73   GFR calc Af Amer 67 >59 mL/min/1.73   BUN/Creatinine Ratio 9 (L) 10 - 22   Sodium 142 134 - 144 mmol/L   Potassium 5.3 (H) 3.5 - 5.2 mmol/L   Chloride 101 97 - 108 mmol/L   CO2 25 18 - 29 mmol/L   Calcium 10.0 8.6 - 10.2 mg/dL   Total Protein 7.1 6.0 - 8.5 g/dL   Albumin 4.8 3.6 - 4.8 g/dL   Globulin, Total 2.3 1.5 - 4.5 g/dL   Albumin/Globulin Ratio 2.1 1.1 -  2.5   Bilirubin Total 1.3 (H) 0.0 - 1.2 mg/dL   Alkaline Phosphatase 36 (L) 39 - 117 IU/L   AST 26 0 - 40 IU/L   ALT 35 0 - 44 IU/L  Lipid panel  Result Value Ref Range   Cholesterol, Total 144 100 - 199 mg/dL   Triglycerides 167 (H) 0 - 149 mg/dL   HDL 41 >39 mg/dL   VLDL Cholesterol Cal 33 5 - 40 mg/dL   LDL Calculated 70 0 - 99 mg/dL   Chol/HDL Ratio 3.5 0.0 - 5.0 ratio units  Hemoglobin A1c  Result Value Ref Range   Hgb A1c MFr Bld 5.4 4.8 - 5.6 %   Est. average glucose Bld gHb Est-mCnc 108 mg/dL  POCT urinalysis dipstick  Result Value Ref Range   Color, UA yellow    Clarity, UA clear    Glucose, UA neg    Bilirubin, UA neg    Ketones, UA neg    Spec Grav, UA <=1.005    Blood, UA neg    pH, UA 5.5    Protein, UA neg    Urobilinogen, UA 0.2    Nitrite, UA neg    Leukocytes, UA Negative Negative       Assessment & Plan:   1. Pure hypercholesterolemia   2. Other abnormal glucose   3. Essential hypertension, benign   4. History of basal cell carcinoma     No orders of the defined  types were placed in this encounter.   No orders of the defined types were placed in this encounter.    No Follow-up on file.    Deniz Hannan Elayne Guerin, M.D. [facility] [address] [place],   [address] [phone] phone [phone] fax

## 2015-06-05 NOTE — Patient Instructions (Signed)
1.  Mucinex maximum strength --- 1 tablet twice daily.

## 2015-06-15 ENCOUNTER — Ambulatory Visit: Payer: Medicare Other | Admitting: Family Medicine

## 2015-06-26 ENCOUNTER — Encounter: Payer: Self-pay | Admitting: Urology

## 2015-06-26 ENCOUNTER — Telehealth: Payer: Self-pay

## 2015-06-26 ENCOUNTER — Ambulatory Visit (INDEPENDENT_AMBULATORY_CARE_PROVIDER_SITE_OTHER): Payer: Federal, State, Local not specified - PPO | Admitting: Urology

## 2015-06-26 ENCOUNTER — Ambulatory Visit: Payer: Medicare Other | Admitting: Family Medicine

## 2015-06-26 VITALS — BP 128/79 | HR 73 | Ht 72.0 in | Wt 220.8 lb

## 2015-06-26 DIAGNOSIS — N401 Enlarged prostate with lower urinary tract symptoms: Secondary | ICD-10-CM | POA: Diagnosis not present

## 2015-06-26 DIAGNOSIS — N138 Other obstructive and reflux uropathy: Secondary | ICD-10-CM

## 2015-06-26 NOTE — Telephone Encounter (Signed)
Pt states he had a PSA test done and is trying to know what the results are. Please call [phone]

## 2015-06-26 NOTE — Telephone Encounter (Signed)
Notes Recorded by [name], MD on 12/13/2013 at 9:47 AM Call -- 1. No evidence of anemia. 2. Cholesterol is normal. 3. Kidney function is slightly elevated; we will repeat at next visit. 3. Liver function is normal.  4. PSA or prostate level is normal. 5. Urine is normal.

## 2015-06-26 NOTE — Telephone Encounter (Signed)
Can we find this, it may be in scanning.

## 2015-06-26 NOTE — Progress Notes (Signed)
06/26/2015 10:22 PM   Ruben Garcia 08/10/1945 [id number]  Referring provider: [name], MD [address] [place], [place] [address]  Chief Complaint  Patient presents with  . Benign Prostatic Hypertrophy    1 year recheck    HPI: Patient is a 70 year old Caucasian male with a history of BPH with LUTS who presents today for his yearly exam.  BPH WITH LUTS His IPSS score today is 1, which is mild lower urinary tract symptomatology. He is delighted with his quality life due to his urinary symptoms.  He denies any dysuria, hematuria or suprapubic pain.  He also denies any recent fevers, chills, nausea or vomiting.  He does not have a family history of PCa.      IPSS      06/26/15 0900       International Prostate Symptom Score   How often have you had the sensation of not emptying your bladder? Not at All     How often have you had to urinate less than every two hours? Not at All     How often have you found you stopped and started again several times when you urinated? Not at All     How often have you found it difficult to postpone urination? Not at All     How often have you had a weak urinary stream? Not at All     How often have you had to strain to start urination? Not at All     How many times did you typically get up at night to urinate? 1 Time     Total IPSS Score 1     Quality of Life due to urinary symptoms   If you were to spend the rest of your life with your urinary condition just the way it is now how would you feel about that? Delighted        Score:  1-7 Mild 8-19 Moderate 20-35 Severe    PMH: Past Medical History  Diagnosis Date  . Hemorrhoids, internal   . Unspecified disorder of liver   . Other abnormal glucose   . Personal history of colonic polyps   . Personal history of other genital system and obstetric disorders(V13.29)   . Allergic rhinitis, cause unspecified   . Essential hypertension, benign   . Pure hypercholesterolemia     . Unspecified disorder of kidney and ureter   . Basal cell carcinoma of skin, site unspecified 04/28/2010    Nasal; [name].  . Incomplete bladder emptying   . BPH (benign prostatic hypertrophy)   . Over weight     Surgical History: Past Surgical History  Procedure Laterality Date  . Hemorrhoid surgery      [name]  . Gallbladder surgery  1984  . Basal cell carcinoma excision  2012    Facial        [facility]  . Ear surgery  05/2012    [name].  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  Benign.  . Colonoscopy  11/13/2010    single polyp; repeatin 5 years; [place]  . Cholecystectomy    . Vasectomy      Home Medications:    Medication List       This list is accurate as of: 06/26/15 11:59 PM.  Always use your most recent med list.               aspirin 81 MG tablet  Take 81 mg by mouth daily.     CALCIUM  600+D 600-200 MG-UNIT Tabs  Generic drug:  Calcium Carbonate-Vitamin D  Take 1 tablet by mouth daily.     cyanocobalamin 100 MCG tablet  Take 100 mcg by mouth daily.     FISH OIL CONCENTRATE 1000 MG Caps  Take 1 capsule by mouth daily.     Garlic 10 MG Caps  Take by mouth.     ipratropium 0.03 % nasal spray  Commonly known as:  ATROVENT  Place 2 sprays into the nose 2 (two) times daily.     lisinopril 20 MG tablet  Commonly known as:  PRINIVIL,ZESTRIL  TAKE ONE TABLET BY MOUTH ONCE DAILY     MULTIVITAMIN PO  Take by mouth daily.     simvastatin 20 MG tablet  Commonly known as:  ZOCOR  TAKE ONE TABLET BY MOUTH IN THE EVENING     vitamin C 500 MG tablet  Commonly known as:  ASCORBIC ACID  Take 500 mg by mouth daily.     vitamin E 400 UNIT capsule  Take 400 Units by mouth daily.        Allergies:  Allergies  Allergen Reactions  . Codeine Nausea Only    Family History: Family History  Problem Relation Age of Onset  . Heart disease Mother   . Diabetes Mother   . Hyperlipidemia Mother   . Arthritis Mother     rheumatoid  . Cancer Father     lung  . Diabetes  Father   . Arthritis Sister   . Hyperlipidemia Sister   . Hyperlipidemia Sister   . Hypertension Sister   . Hyperlipidemia Sister   . Kidney disease Neg Hx   . Prostate cancer Neg Hx     Social History:  reports that he has never smoked. He does not have any smokeless tobacco history on file. He reports that he drinks alcohol. He reports that he does not use illicit drugs.  ROS: UROLOGY Burning/pain with urination?: No Get up at night to urinate?: No Leakage of urine?: No Urine stream starts and stops?: No Trouble starting stream?: No Do you have to strain to urinate?: No Blood in urine?: No Urinary tract infection?: No Sexually transmitted disease?: No Injury to kidneys or bladder?: No Painful intercourse?: No Weak stream?: No Erection problems?: No Penile pain?: No  Gastrointestinal Nausea?: No Vomiting?: No Indigestion/heartburn?: No Diarrhea?: No Constipation?: No  Constitutional Fever: No Night sweats?: No Weight loss?: No Fatigue?: No  Skin Skin rash/lesions?: No Itching?: No  Eyes Blurred vision?: No Double vision?: No  Ears/Nose/Throat Sore throat?: No Sinus problems?: Yes  Hematologic/Lymphatic Swollen glands?: No Easy bruising?: No  Cardiovascular Leg swelling?: No Chest pain?: No  Respiratory Cough?: No Shortness of breath?: No  Endocrine Excessive thirst?: No  Musculoskeletal Back pain?: No Joint pain?: No  Neurological Headaches?: No Dizziness?: No  Psychologic Depression?: No Anxiety?: No  Physical Exam: BP 128/79 mmHg  Pulse 73  Ht 6' (1.829 m)  Wt 220 lb 12.8 oz (100.154 kg)  BMI 29.94 kg/m2  Constitutional: Well nourished. Alert and oriented, No acute distress. HEENT: [place] AT, moist mucus membranes. Trachea midline, no masses. Cardiovascular: No clubbing, cyanosis, or edema. Respiratory: Normal respiratory effort, no increased work of breathing. GI: Abdomen is soft, non tender, non distended, no abdominal  masses. Liver and spleen not palpable.  No hernias appreciated.  Stool sample for occult testing is not indicated.   GU: No CVA tenderness.  No bladder fullness or masses.  Patient with uncircumcised phallus.  Foreskin easily retracted  Urethral meatus is patent.  No penile discharge. No penile lesions or rashes. Scrotum without lesions, cysts, rashes and/or edema.  Testicles are located scrotally bilaterally. No masses are appreciated in the testicles. Left and right epididymis are normal. Rectal: Patient with  normal sphincter tone. Anus and perineum without scarring or rashes. No rectal masses are appreciated. Prostate is approximately 55 grams, no nodules are appreciated. Seminal vesicles are normal. Skin: No rashes, bruises or suspicious lesions. Lymph: No cervical or inguinal adenopathy. Neurologic: Grossly intact, no focal deficits, moving all 4 extremities. Psychiatric: Normal mood and affect.  Laboratory Data: Lab Results  Component Value Date   WBC 7.8 12/14/2014   HGB 15.7 06/08/2014   HCT 44.6 12/14/2014   MCV 93 12/14/2014   PLT 268 12/14/2014    Lab Results  Component Value Date   CREATININE 1.27 12/14/2014    Lab Results  Component Value Date   PSA 0.3 12/08/2013    Lab Results  Component Value Date   HGBA1C 5.4 12/14/2014       Component Value Date/Time   CHOL 144 12/14/2014 0812   HDL 41 12/14/2014 0812   CHOLHDL 3.5 12/14/2014 0812   LDLCALC 70 12/14/2014 0812    Lab Results  Component Value Date   AST 26 12/14/2014   Lab Results  Component Value Date   ALT 35 12/14/2014    Assessment & Plan:    1. BPH (benign prostatic hyperplasia) with LUTS:   IPSS score 1/0.  We will continue to monitor. He will return in 1 year for I PSS score and exam. Patient states his primary care physician Dr. Tamala Julian obtained his PSA values. We will contact her office to receive his most recent PSA results.    Return in about 1 year (around 06/25/2016) for IPSS score and  exam.  These notes generated with voice recognition software. I apologize for typographical errors.  Zara Council, [facility] [address] [place], [place] [address] [phone]

## 2015-06-27 NOTE — Telephone Encounter (Signed)
Has been sent to the scan center to be entered into epic.

## 2015-06-27 NOTE — Telephone Encounter (Signed)
These were done at [facility].

## 2015-06-27 NOTE — Telephone Encounter (Signed)
Dr. Tamala Julian do you remember seeing these? Lab can we call [facility] and request another copy?

## 2015-06-27 NOTE — Telephone Encounter (Signed)
The patient is extremely upset that his lab results were sent to the scan center without being reviewed and discussed with him. He is upset that his information is not in our office.  He said he has been waiting for weeks for the results, and he is upset that he still cannot be told his results.  Please advise, thank you.

## 2015-07-06 ENCOUNTER — Telehealth: Payer: Self-pay

## 2015-07-06 NOTE — Telephone Encounter (Signed)
Pt complains of body aches, fever, and weakness. He's pretty sure he has the flu and would like for a meds to be sent do pharmacy. Also from his recent CPE, he would like for his blood work to be mailed to him. Confirmed his address is still the same.  Please advise  [phone]

## 2015-07-08 ENCOUNTER — Emergency Department: Payer: Federal, State, Local not specified - PPO

## 2015-07-08 ENCOUNTER — Encounter: Payer: Self-pay | Admitting: Emergency Medicine

## 2015-07-08 ENCOUNTER — Emergency Department
Admission: EM | Admit: 2015-07-08 | Discharge: 2015-07-08 | Disposition: A | Discharge status: Home / Self Care | Payer: Federal, State, Local not specified - PPO | Attending: Emergency Medicine | Admitting: Emergency Medicine

## 2015-07-08 DIAGNOSIS — R2241 Localized swelling, mass and lump, right lower limb: Secondary | ICD-10-CM | POA: Diagnosis present

## 2015-07-08 DIAGNOSIS — I10 Essential (primary) hypertension: Secondary | ICD-10-CM | POA: Insufficient documentation

## 2015-07-08 DIAGNOSIS — L03115 Cellulitis of right lower limb: Secondary | ICD-10-CM | POA: Diagnosis not present

## 2015-07-08 DIAGNOSIS — J029 Acute pharyngitis, unspecified: Secondary | ICD-10-CM | POA: Diagnosis not present

## 2015-07-08 DIAGNOSIS — Z7982 Long term (current) use of aspirin: Secondary | ICD-10-CM | POA: Diagnosis not present

## 2015-07-08 DIAGNOSIS — Z79899 Other long term (current) drug therapy: Secondary | ICD-10-CM | POA: Insufficient documentation

## 2015-07-08 DIAGNOSIS — Z792 Long term (current) use of antibiotics: Secondary | ICD-10-CM | POA: Insufficient documentation

## 2015-07-08 MED ORDER — ONDANSETRON 4 MG PO TBDP: 4.0000 mg | ORAL_TABLET | Freq: Three times a day (TID) | ORAL | Status: DC | PRN

## 2015-07-08 MED ORDER — DOXYCYCLINE HYCLATE 100 MG PO CAPS: 100.0000 mg | ORAL_CAPSULE | Freq: Two times a day (BID) | ORAL | Status: DC

## 2015-07-08 NOTE — ED Notes (Signed)
Pt alert and oriented X4, active, cooperative, pt in NAD. RR even and unlabored, color WNL.  Pt informed to return if any life threatening symptoms occur.   

## 2015-07-08 NOTE — ED Notes (Signed)
Patient transported to Ultrasound 

## 2015-07-08 NOTE — ED Notes (Signed)
MD at bedside. 

## 2015-07-08 NOTE — Discharge Instructions (Signed)

## 2015-07-08 NOTE — ED Notes (Signed)
Patient presents to the ED with redness, pain and swelling to his right foot/leg.  Patient states pain to right leg began on Thursday and patient noticed today when he took his sock off that his foot appeared red and swollen.  Patient reports joint pain, chills, sweats for about 2 days that started after he noticed his right foot.

## 2015-07-08 NOTE — ED Provider Notes (Signed)
[facility] Emergency Department Provider Note  ____________________________________________  Time seen: 12:15 PM  I have reviewed the triage vital signs and the nursing notes.   HISTORY  Chief Complaint Leg Swelling    HPI Ruben Garcia is a 70 y.o. male who complains of pain in the right thigh and swelling and redness in the right foot that started about 3 days ago. Denies any chest pain or shortness of breath. He actually has been having an influenza-like illness recently with body aches nonproductive cough chills and fatigue. He went to urgent care where he had a negative flu test. He is eating and drinking normally and otherwise in his usual state of health. He does have a history of cellulitis in the left foot. He does not have diabetes. No recent trauma. No numbness [place] or weakness.     Past Medical History  Diagnosis Date  . Hemorrhoids, internal   . Unspecified disorder of liver   . Other abnormal glucose   . Personal history of colonic polyps   . Personal history of other genital system and obstetric disorders(V13.29)   . Allergic rhinitis, cause unspecified   . Essential hypertension, benign   . Pure hypercholesterolemia   . Unspecified disorder of kidney and ureter   . Basal cell carcinoma of skin, site unspecified 04/28/2010    Nasal; [name].  . Incomplete bladder emptying   . BPH (benign prostatic hypertrophy)   . Over weight      Patient Active Problem List   Diagnosis Date Noted  . BPH with obstruction/lower urinary tract symptoms 06/26/2015  . History of basal cell carcinoma 12/11/2014  . Glucose intolerance (impaired glucose tolerance) 12/11/2014  . Annual physical exam 11/29/2012  . Essential hypertension, benign 11/29/2012  . Pure hypercholesterolemia 11/29/2012  . Other abnormal glucose 11/29/2012     Past Surgical History  Procedure Laterality Date  . Hemorrhoid surgery      [name]  . Gallbladder surgery  1984   . Basal cell carcinoma excision  2012    Facial        [facility]  . Ear surgery  05/2012    [name].  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  Benign.  . Colonoscopy  11/13/2010    single polyp; repeatin 5 years; [place]  . Cholecystectomy    . Vasectomy       Current Outpatient Rx  Name  Route  Sig  Dispense  Refill  . aspirin 81 MG tablet   Oral   Take 81 mg by mouth daily.         . Calcium Carbonate-Vitamin D (CALCIUM 600+D) 600-200 MG-UNIT TABS   Oral   Take 1 tablet by mouth daily.         . cyanocobalamin 100 MCG tablet   Oral   Take 100 mcg by mouth daily.         [name] Kitchen doxycycline (VIBRAMYCIN) 100 MG capsule   Oral   Take 1 capsule (100 mg total) by mouth 2 (two) times daily.   28 capsule   0   . Garlic 10 MG CAPS   Oral   Take by mouth.         [name] Kitchen ipratropium (ATROVENT) 0.03 % nasal spray   Nasal   Place 2 sprays into the nose 2 (two) times daily.   30 mL   0   . lisinopril (PRINIVIL,ZESTRIL) 20 MG tablet      TAKE ONE TABLET BY MOUTH ONCE DAILY  90 tablet   3   . Multiple Vitamins-Minerals (MULTIVITAMIN PO)   Oral   Take by mouth daily.         . Omega-3 Fatty Acids (FISH OIL CONCENTRATE) 1000 MG CAPS   Oral   Take 1 capsule by mouth daily.         . ondansetron (ZOFRAN ODT) 4 MG disintegrating tablet   Oral   Take 1 tablet (4 mg total) by mouth every 8 (eight) hours as needed for nausea or vomiting.   20 tablet   0   . simvastatin (ZOCOR) 20 MG tablet      TAKE ONE TABLET BY MOUTH IN THE EVENING   90 tablet   3   . vitamin C (ASCORBIC ACID) 500 MG tablet   Oral   Take 500 mg by mouth daily.         . vitamin E 400 UNIT capsule   Oral   Take 400 Units by mouth daily.            Allergies Codeine   Family History  Problem Relation Age of Onset  . Heart disease Mother   . Diabetes Mother   . Hyperlipidemia Mother   . Arthritis Mother     rheumatoid  . Cancer Father     lung  . Diabetes Father   . Arthritis Sister   .  Hyperlipidemia Sister   . Hyperlipidemia Sister   . Hypertension Sister   . Hyperlipidemia Sister   . Kidney disease Neg Hx   . Prostate cancer Neg Hx     Social History Social History  Substance Use Topics  . Smoking status: Never Smoker   . Smokeless tobacco: None  . Alcohol Use: Yes     Comment: occasional weekends beer, 4- 5 total [employer]    Review of Systems  Constitutional:   Positive chills. No weight changes Eyes:   No blurry vision or double vision.  ENT:   Positive sore throat.  Cardiovascular:   No chest pain. Respiratory:   No dyspnea positive nonproductive cough. Gastrointestinal:   Negative for abdominal pain, vomiting and diarrhea.  No BRBPR or melena. Genitourinary:   Negative for dysuria or difficulty urinating. Musculoskeletal:   Right thigh foot and ankle pain. Swelling in the right foot Skin:   Redness to the right foot. Neurological:   Negative for headaches, focal weakness or numbness. Psychiatric:  No anxiety or depression.   Endocrine:  No changes in energy or sleep difficulty.  10-point ROS otherwise negative.  ____________________________________________   PHYSICAL EXAM:  VITAL SIGNS: ED Triage Vitals  Enc Vitals Group     BP 07/08/15 1142 138/90 mmHg     Pulse Rate 07/08/15 1142 111     Resp 07/08/15 1142 20     Temp 07/08/15 1142 98.2 F (36.8 C)     Temp Source 07/08/15 1142 Oral     SpO2 07/08/15 1142 98 %     Weight 07/08/15 1142 216 lb (97.977 kg)     Height 07/08/15 1142 6' (1.829 m)     Head Cir --      Peak Flow --      Pain Score 07/08/15 1143 9     Pain Loc --      Pain Edu? --      Excl. in [place]? --     Vital signs reviewed, nursing assessments reviewed.   Constitutional:   Alert and oriented. Well appearing and in  no distress. Eyes:   No scleral icterus. No conjunctival pallor. PERRL. EOMI ENT   Head:   Normocephalic and atraumatic.   Nose:   No congestion/rhinnorhea. No septal hematoma    Mouth/Throat:   MMM, no pharyngeal erythema. No peritonsillar mass.    Neck:   No stridor. No SubQ emphysema. No meningismus. Hematological/Lymphatic/Immunilogical:   No cervical lymphadenopathy. Cardiovascular:   RRR. Symmetric bilateral radial and DP pulses.  No murmurs.  Respiratory:   Normal respiratory effort without tachypnea nor retractions. Breath sounds are clear and equal bilaterally. Slight end expiratory wheezing.. Gastrointestinal:   Soft and nontender. Non distended. There is no CVA tenderness.  No rebound, rigidity, or guarding. Genitourinary:   deferred Musculoskeletal:   Right foot is diffusely erythematous with some mild edema. There is no fluctuance, no wounds or drainage. No crepitus. The ankle is uninvolved other than some mild lymphangitis anteriorly. The rest of the leg is uninvolved and there is no calf or thigh tenderness or crepitus or erythema or other inflammatory changes.. Neurologic:   Normal speech and language.  CN 2-10 normal. Motor grossly intact. No gross focal neurologic deficits are appreciated.  Skin:    Skin is warm, dry and intact. Right foot erythema as noted above.  No petechiae, purpura, or bullae. Psychiatric:   Mood and affect are normal. ____________________________________________    LABS (pertinent positives/negatives) (all labs ordered are listed, but only abnormal results are displayed) Labs Reviewed - No data to display ____________________________________________   EKG    ____________________________________________    RADIOLOGY  Right lower extremity venous ultrasound Doppler unremarkable, negative for DVT  ____________________________________________   PROCEDURES   ____________________________________________   INITIAL IMPRESSION / ASSESSMENT AND PLAN / ED COURSE  Pertinent labs & imaging results that were available during my care of the patient were reviewed by me and considered in my medical decision making (see  chart for details).  Patient resents with what appears to be cellulitis of the right foot. With his thigh pain will also get an ultrasound to evaluate for DVT.  ----------------------------------------- 1:52 PM on 07/08/2015 -----------------------------------------  Ultrasound negative. We'll treat this with doxycycline outpatient as the patient does not have any evidence of sepsis. On my exam the heart rate is within normal limits despite a documented tachycardia on his vital sign checks. He is very well-appearing tolerating oral intake and comfortable with outpatient management. There is no evidence of abscess necrotizing fasciitis or osteomyelitis. Based on patient's comorbidities he is not at a high risk for a complicated course and I expect he will do well with oral antibiotics. He did report this seemed to happen after he was sitting outside wearing shorts at a picnic table. He did not notice any spider bites or insect bites but we will choose doxycycline for antibiotics coverage to ensure that whenever his causative agent is covered.     ____________________________________________   FINAL CLINICAL IMPRESSION(S) / ED DIAGNOSES  Final diagnoses:  Cellulitis of right foot      Carrie Mew, MD 07/08/15 1356

## 2015-07-08 NOTE — ED Notes (Signed)
Pain that began to right upper leg on Thursday that has now moved to painful right lower leg today, redness noticed today. PT also reports flu sx but states he tested negative for flu at [facility]. Pt alert and oriented X4, active, cooperative, pt in NAD. RR even and unlabored, color WNL.

## 2015-07-09 NOTE — Telephone Encounter (Signed)
Left VM informing pt lab results will be mailed today. I saw in patient's chart that he was seen in the ED yesterday for cellulitis of his foot.

## 2015-07-12 ENCOUNTER — Telehealth: Payer: Self-pay | Admitting: Urology

## 2015-07-12 NOTE — Telephone Encounter (Signed)
There were no PSA values in the notes I received.  Please request again.  I would like his last three PSA's for the chart.

## 2015-07-13 NOTE — Telephone Encounter (Signed)
They are on Epic and what is in the chart is all they have. I called to confirm   [facility]

## 2015-07-15 NOTE — Telephone Encounter (Signed)
Patient will need to have his PSA drawn since the last one in the computer is from 2015.

## 2015-07-16 NOTE — Telephone Encounter (Signed)
Spoke with patient and he states that he did have his blood work done at [facility] from his [place] at [facility]. Patient states they called him with the results 0.4. I let patient know that I would try to call them back and see if I can get some copies. I let him know if I have a problem that he will need to repeat his PSA here. Patient ok with plan.

## 2015-07-16 NOTE — Telephone Encounter (Signed)
Dr. Isla Pence looking for PSA Lab results.

## 2015-07-16 NOTE — Telephone Encounter (Signed)
Patient called you back  Please call him when you get a chance  michelle

## 2015-07-16 NOTE — Telephone Encounter (Signed)
LMOM for patient to call office to talk to me about some blood work.

## 2015-07-25 ENCOUNTER — Other Ambulatory Visit: Payer: Self-pay

## 2015-07-30 NOTE — Telephone Encounter (Signed)
I don't have a copy of his PSA in his chart.

## 2015-12-11 ENCOUNTER — Ambulatory Visit (INDEPENDENT_AMBULATORY_CARE_PROVIDER_SITE_OTHER): Payer: Federal, State, Local not specified - PPO | Admitting: Family Medicine

## 2015-12-11 ENCOUNTER — Encounter: Payer: Self-pay | Admitting: Family Medicine

## 2015-12-11 VITALS — BP 126/84 | HR 81 | Temp 98.2°F | Resp 16 | Ht 71.25 in | Wt 218.8 lb

## 2015-12-11 DIAGNOSIS — N401 Enlarged prostate with lower urinary tract symptoms: Secondary | ICD-10-CM

## 2015-12-11 DIAGNOSIS — E7800 Pure hypercholesterolemia, unspecified: Secondary | ICD-10-CM | POA: Diagnosis not present

## 2015-12-11 DIAGNOSIS — I10 Essential (primary) hypertension: Secondary | ICD-10-CM | POA: Diagnosis not present

## 2015-12-11 DIAGNOSIS — Z0000 Encounter for general adult medical examination without abnormal findings: Secondary | ICD-10-CM

## 2015-12-11 DIAGNOSIS — R7302 Impaired glucose tolerance (oral): Secondary | ICD-10-CM | POA: Diagnosis not present

## 2015-12-11 DIAGNOSIS — Z1159 Encounter for screening for other viral diseases: Secondary | ICD-10-CM

## 2015-12-11 DIAGNOSIS — Z85828 Personal history of other malignant neoplasm of skin: Secondary | ICD-10-CM | POA: Diagnosis not present

## 2015-12-11 DIAGNOSIS — N138 Other obstructive and reflux uropathy: Secondary | ICD-10-CM

## 2015-12-11 LAB — POCT URINALYSIS DIP (MANUAL ENTRY)
Bilirubin, UA: NEGATIVE
Glucose, UA: NEGATIVE
Ketones, POC UA: NEGATIVE
Leukocytes, UA: NEGATIVE
NITRITE UA: NEGATIVE
PH UA: 5.5
PROTEIN UA: NEGATIVE
RBC UA: NEGATIVE
Spec Grav, UA: <=1.005
UROBILINOGEN UA: 0.2

## 2015-12-11 LAB — POC MICROSCOPIC URINALYSIS (UMFC): Mucus: ABSENT

## 2015-12-11 MED ORDER — LISINOPRIL 20 MG PO TABS: ORAL_TABLET | ORAL | 3 refills | Status: DC

## 2015-12-11 MED ORDER — SIMVASTATIN 20 MG PO TABS: ORAL_TABLET | ORAL | 3 refills | Status: DC

## 2015-12-11 NOTE — Patient Instructions (Addendum)
   IF you received an x-ray today, you will receive an invoice from [facility]. Please contact [facility] at [phone] with questions or concerns regarding your invoice.   IF you received labwork today, you will receive an invoice from [facility]. Please contact [facility] at [phone] with questions or concerns regarding your invoice.   Our billing staff will not be able to assist you with questions regarding bills from these companies.  You will be contacted with the lab results as soon as they are available. The fastest way to get your results is to activate your My Chart account. Instructions are located on the last page of this paperwork. If you have not heard from us regarding the results in 2 weeks, please contact this office.    Keeping you healthy  Get these tests  Blood pressure- Have your blood pressure checked once a year by your healthcare provider.  Normal blood pressure is 120/80  Weight- Have your body mass index (BMI) calculated to screen for obesity.  BMI is a measure of body fat based on height and weight. You can also calculate your own BMI at www.[facility]hl[facility].com/bmi/.  Cholesterol- Have your cholesterol checked every year.  Diabetes- Have your blood sugar checked regularly if you have high blood pressure, high cholesterol, have a family history of diabetes or if you are overweight.  Screening for Colon Cancer- Colonoscopy starting at age 50.  Screening may begin sooner depending on your family history and other health conditions. Follow up colonoscopy as directed by your Gastroenterologist.  Screening for Prostate Cancer- Both blood work (PSA) and a rectal exam help screen for Prostate Cancer.  Screening begins at age 40 with African-American men and at age 50 with Caucasian men.  Screening may begin sooner depending on your family history.  Take these medicines  Aspirin- One aspirin daily can help prevent Heart  disease and Stroke.  Flu shot- Every fall.  Tetanus- Every 10 years.  Zostavax- Once after the age of 60 to prevent Shingles.  Pneumonia shot- Once after the age of 65; if you are younger than 65, ask your healthcare provider if you need a Pneumonia shot.  Take these steps  Don't smoke- If you do smoke, talk to your doctor about quitting.  For tips on how to quit, go to www.smokefree.gov or call 1-800-QUIT-NOW.  Be physically active- Exercise 5 days a week for at least 30 minutes.  If you are not already physically active start slow and gradually work up to 30 minutes of moderate physical activity.  Examples of moderate activity include walking briskly, mowing the yard, dancing, swimming, bicycling, etc.  Eat a healthy diet- Eat a variety of healthy food such as fruits, vegetables, low fat milk, low fat cheese, yogurt, lean meant, poultry, fish, beans, tofu, etc. For more information go to www.thenutritionsource.org  Drink alcohol in moderation- Limit alcohol intake to less than two drinks a day. Never drink and drive.  Dentist- Brush and floss twice daily; visit your dentist twice a year.  Depression- Your emotional health is as important as your physical health. If you're feeling down, or losing interest in things you would normally enjoy please talk to your healthcare provider.  Eye exam- Visit your eye doctor every year.  Safe sex- If you may be exposed to a sexually transmitted infection, use a condom.  Seat belts- Seat belts can save your life; always wear one.  Smoke/Carbon Monoxide detectors- These detectors need to be installed on   the appropriate level of your home.  Replace batteries at least once a year.  Skin cancer- When out in the sun, cover up and use sunscreen 15 SPF or higher.  Violence- If anyone is threatening you, please tell your healthcare provider.  Living Will/ Health care power of attorney- Speak with your healthcare provider and family. 

## 2015-12-11 NOTE — Progress Notes (Signed)
Subjective:    Patient ID: Ruben Garcia, male    DOB: [birth year], 70 y.o.   MRN: [id number]  By signing my name below, I, Judithe Modest, attest that this documentation has been prepared under the direction and in the presence of Sonia Baller, MD. Electronically Signed: Judithe Modest, ER Scribe. 12/11/2015. 2:16 PM.  12/11/2015  Annual Exam (WITH REFILL x 1 year per patient)  HPI HPI Comments: Ruben Garcia is a 70 y.o. male who presents to [facility] complaining of reporting for a full physical. His last physical was one year ago. Last colonoscopy July, 2015. His last eye care visit was last year. He sees his dentist twice per year. He has not had any surgeries since his last visit. He has a family hx of heart disease, osteoporosis (mother), and cancer (father). His sisters have high cholesterol. He drinks six beers every weekend. He is walking 2.5 miles, four days per week. He is allergic to codeine. He has not been taking his statin for the last week because he ran out of the medication. Left ear hearing is normal, right ear hearing is slightly reduced. He has occasional ringing in his ears. He denies having any HA, dizziness, sores in mouth, CP, palpitations, SOB, cough. He snores at night but does not have daytime somnolence. He sees Dr. Sabra Heck, orthopedist, due to bone spurs in his bilateral shoulders. He denies bloody or black stools, heart burn, diarrhea. He has regular BM. He sleeps well. His urine stream is intermittently strong and weak. He sees the urologist yearly. He has no family hx of prostate cancer.   He had cellulitis on his right foot six months ago.    Immunization History  Administered Date(s) Administered  . Influenza-Unspecified 01/28/2011, 02/02/2014, 01/28/2015  . Pneumococcal Conjugate-13 12/05/2013  . Pneumococcal Polysaccharide-23 06/05/2015  . Tdap 05/03/2008  . Zoster 03/12/2011    Review of Systems  Constitutional: Negative for activity change, appetite  change, chills, diaphoresis, fatigue, fever and unexpected weight change.  HENT: Positive for hearing loss and tinnitus. Negative for congestion, dental problem, drooling, ear discharge, ear pain, facial swelling, mouth sores, nosebleeds, postnasal drip, rhinorrhea, sinus pressure, sneezing, sore throat, trouble swallowing and voice change.   Eyes: Negative for photophobia, pain, discharge, redness, itching and visual disturbance.  Respiratory: Negative for apnea, cough, choking, chest tightness, shortness of breath, wheezing and stridor.   Cardiovascular: Negative for chest pain, palpitations and leg swelling.  Gastrointestinal: Negative for abdominal pain, blood in stool, constipation, diarrhea, nausea and vomiting.  Endocrine: Negative for cold intolerance, heat intolerance, polydipsia, polyphagia and polyuria.  Genitourinary: Negative for decreased urine volume, difficulty urinating, discharge, dysuria, enuresis, flank pain, frequency, genital sores, hematuria, penile pain, penile swelling, scrotal swelling, testicular pain and urgency.  Musculoskeletal: Positive for arthralgias. Negative for back pain, gait problem, joint swelling, myalgias, neck pain and neck stiffness.  Skin: Negative for color change, pallor, rash and wound.  Allergic/Immunologic: Negative for environmental allergies, food allergies and immunocompromised state.  Neurological: Negative for dizziness, tremors, seizures, syncope, facial asymmetry, speech difficulty, weakness, light-headedness, numbness and headaches.  Hematological: Negative for adenopathy. Does not bruise/bleed easily.  Psychiatric/Behavioral: Negative for agitation, behavioral problems, confusion, decreased concentration, dysphoric mood, hallucinations, self-injury, sleep disturbance and suicidal ideas. The patient is not nervous/anxious and is not hyperactive.     Past Medical History:  Diagnosis Date  . Allergic rhinitis, cause unspecified   . Basal cell  carcinoma of skin, site unspecified 04/28/2010   Nasal;  [place].  [name] Kitchen BPH (benign prostatic hypertrophy)   . Essential hypertension, benign   . Hemorrhoids, internal   . Incomplete bladder emptying   . Other abnormal glucose   . Over weight   . Personal history of colonic polyps   . Personal history of other genital system and obstetric disorders(V13.29)   . Pure hypercholesterolemia   . Unspecified disorder of kidney and ureter   . Unspecified disorder of liver    Past Surgical History:  Procedure Laterality Date  . BASAL CELL CARCINOMA EXCISION  2012   Facial        [place]  . CHOLECYSTECTOMY    . COLONOSCOPY  11/13/2010   single polyp; repeatin 5 years; [place]  . COLONOSCOPY WITH PROPOFOL N/A 12/13/2015   Procedure: COLONOSCOPY WITH PROPOFOL;  Surgeon: [name], MD;  Location[facility]COPY;  Service: Endoscopy;  Laterality: N/A;  . ear surgery  05/2012   [name].  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  Benign.  [name] Kitchen GALLBLADDER SURGERY  1984  . HEMORRHOID SURGERY     Dornell Grasmick  . VASECTOMY     Allergies  Allergen Reactions  . Codeine Nausea Only    Social History   Social History  . Marital status: Married    Spouse name: N/A  . Number of children: 2  . Years of education: college   Occupational History  . retired     Actor in 2005   Social History Main Topics  . Smoking status: Never Smoker  . Smokeless tobacco: Not on file  . Alcohol use Yes     Comment: occasional weekends beer, 4- 5 total Miller Light  . Drug use: No  . Sexual activity: Yes   Other Topics Concern  . Not on file   Social History Narrative   Always uses seat belts. Smoke alarm and carbon monoxide detector in the home. Guns in the home stored in locked cabinet.Caffeine use: Coffee 2 servings per day, moderate amount. Exercise: Moderate walking 2 - 4 miles daily, 2 - 3 days per week.    Marital status:  Married x 47 years, happily.      Children:  2 children; 1 grandchild (31).       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      Tobacco: never      Alcohol:  Weekends; beer 4-6 per weekend.      Exercise:  Walking every other day 2.5 miles three times per day.      Seatbelt: 100%; no texting      Guns: secured guns.      Living Will: completed in 2014.  FULL CODE but no prolonged measures.        ADLs: independent with ADLs; no assistant devices   Family History  Problem Relation Age of Onset  . Heart disease Mother   . Diabetes Mother   . Hyperlipidemia Mother   . Arthritis Mother     rheumatoid  . Cancer Father     lung  . Diabetes Father   . Arthritis Sister   . Hyperlipidemia Sister   . Hyperlipidemia Sister   . Hypertension Sister   . Hyperlipidemia Sister   . Kidney disease Neg Hx   . Prostate cancer Neg Hx        Objective:    BP 126/84 (BP Location: Left Arm, Patient Position: Sitting, Cuff Size: Normal)   Pulse 81   Temp 98.2 F (36.8 C) (Oral)  Resp 16   Ht 5' 11.25" (1.81 m)   Wt 218 lb 12.8 oz (99.2 kg)   SpO2 98%   BMI 30.30 kg/m   Physical Exam  Constitutional: He is oriented to person, place, and time. He appears well-developed and well-nourished. No distress.  HENT:  Head: Normocephalic and atraumatic.  Right Ear: External ear normal.  Left Ear: External ear normal.  Nose: Nose normal.  Mouth/Throat: Oropharynx is clear and moist. No oropharyngeal exudate.  Eyes: Conjunctivae and EOM are normal. Pupils are equal, round, and reactive to light.  Neck: Normal range of motion. Neck supple. Carotid bruit is not present. No thyromegaly present.  Cardiovascular: Normal rate, regular rhythm, normal heart sounds and intact distal pulses.  Exam reveals no gallop and no friction rub.   No murmur heard. Pulmonary/Chest: Effort normal and breath sounds normal. No respiratory distress. He has no wheezes. He has no rales.  Abdominal: Soft. Bowel sounds are normal. He exhibits no distension and no mass. There is no  tenderness. There is no rebound and no guarding.  Musculoskeletal: Normal range of motion. He exhibits no edema.       Right shoulder: Normal.       Left shoulder: Normal.       Cervical back: Normal.  Lymphadenopathy:    He has no cervical adenopathy.  Neurological: He is alert and oriented to person, place, and time. He has normal reflexes. No cranial nerve deficit. He exhibits normal muscle tone. Coordination normal.  Skin: Skin is warm and dry. No rash noted. He is not diaphoretic.  Psychiatric: He has a normal mood and affect. His behavior is normal. Judgment and thought content normal.  Nursing note and vitals reviewed.  Depression screen [facility] 2/9 12/11/2015 06/05/2015 12/11/2014 12/05/2013 11/29/2012  Decreased Interest 0 0 0 0 0  Down, Depressed, Hopeless 0 0 0 0 0  PHQ - 2 Score 0 0 0 0 0   Fall Risk  12/11/2015 06/05/2015 12/11/2014 12/05/2013 11/29/2012  Falls in the past year? No No No No No  Functional Status Survey: Is the patient deaf or have difficulty hearing?: Yes Does the patient have difficulty seeing, even when wearing glasses/contacts?: No Does the patient have difficulty concentrating, remembering, or making decisions?: No Does the patient have difficulty walking or climbing stairs?: No Does the patient have difficulty dressing or bathing?: No Does the patient have difficulty doing errands alone such as visiting a doctor's office or shopping?: No      Assessment & Plan:   1. Routine physical examination   2. Essential hypertension, benign   3. Glucose intolerance (impaired glucose tolerance)   4. BPH with obstruction/lower urinary tract symptoms   5. Pure hypercholesterolemia   6. History of basal cell carcinoma   7. Need for hepatitis C screening test     Orders Placed This Encounter  Procedures  . CBC with Differential/Platelet  . Comprehensive metabolic panel    Order Specific Question:   Has the patient fasted?    Answer:   Yes  . Lipid panel    Order  Specific Question:   Has the patient fasted?    Answer:   Yes  . Hepatitis C antibody  . POCT urinalysis dipstick  . POCT Microscopic Urinalysis ([facility])  . EKG 12-Lead   Meds ordered this encounter  Medications  . vitamin B-12 (CYANOCOBALAMIN) 100 MCG tablet    Sig: Take 100 mcg by mouth daily.  [name] Kitchen lisinopril (PRINIVIL,ZESTRIL) 20 MG tablet  Sig: TAKE ONE TABLET BY MOUTH ONCE DAILY    Dispense:  90 tablet    Refill:  3  . simvastatin (ZOCOR) 20 MG tablet    Sig: TAKE ONE TABLET BY MOUTH IN THE EVENING    Dispense:  90 tablet    Refill:  3    Return in about 6 months (around 06/12/2016) for recheck high blood pressure.   I personally performed the services described in this documentation, which was scribed in my presence. The recorded information has been reviewed and considered.  Yoshimi Sarr Elayne Guerin, M.D. [facility] [address] [place], [place]  [address] [phone] phone [phone] fax

## 2015-12-13 ENCOUNTER — Ambulatory Visit: Payer: Federal, State, Local not specified - PPO | Admitting: Anesthesiology

## 2015-12-13 ENCOUNTER — Encounter
Admission: RE | Disposition: A | Discharge status: Home / Self Care | Payer: Self-pay | Source: Ambulatory Visit | Attending: Unknown Physician Specialty

## 2015-12-13 ENCOUNTER — Ambulatory Visit
Admission: RE | Admit: 2015-12-13 | Discharge: 2015-12-13 | Disposition: A | Discharge status: Home / Self Care | Payer: Federal, State, Local not specified - PPO | Source: Ambulatory Visit | Attending: Unknown Physician Specialty | Admitting: Unknown Physician Specialty

## 2015-12-13 DIAGNOSIS — K640 First degree hemorrhoids: Secondary | ICD-10-CM | POA: Diagnosis not present

## 2015-12-13 DIAGNOSIS — I10 Essential (primary) hypertension: Secondary | ICD-10-CM | POA: Diagnosis not present

## 2015-12-13 DIAGNOSIS — Z79899 Other long term (current) drug therapy: Secondary | ICD-10-CM | POA: Insufficient documentation

## 2015-12-13 DIAGNOSIS — E7800 Pure hypercholesterolemia, unspecified: Secondary | ICD-10-CM | POA: Diagnosis not present

## 2015-12-13 DIAGNOSIS — Z9049 Acquired absence of other specified parts of digestive tract: Secondary | ICD-10-CM | POA: Diagnosis not present

## 2015-12-13 DIAGNOSIS — Z7982 Long term (current) use of aspirin: Secondary | ICD-10-CM | POA: Diagnosis not present

## 2015-12-13 DIAGNOSIS — Z85828 Personal history of other malignant neoplasm of skin: Secondary | ICD-10-CM | POA: Diagnosis not present

## 2015-12-13 DIAGNOSIS — Z1211 Encounter for screening for malignant neoplasm of colon: Secondary | ICD-10-CM | POA: Insufficient documentation

## 2015-12-13 DIAGNOSIS — Z885 Allergy status to narcotic agent status: Secondary | ICD-10-CM | POA: Diagnosis not present

## 2015-12-13 DIAGNOSIS — K621 Rectal polyp: Secondary | ICD-10-CM | POA: Insufficient documentation

## 2015-12-13 DIAGNOSIS — N400 Enlarged prostate without lower urinary tract symptoms: Secondary | ICD-10-CM | POA: Insufficient documentation

## 2015-12-13 HISTORY — PX: COLONOSCOPY WITH PROPOFOL: SHX5780

## 2015-12-13 LAB — CBC WITH DIFFERENTIAL/PLATELET
BASOS: 0 %
Basophils Absolute: 0 10*3/uL (ref 0.0–0.2)
EOS (ABSOLUTE): 0.1 10*3/uL (ref 0.0–0.4)
EOS: 1 %
HEMOGLOBIN: 15.4 g/dL (ref 12.6–17.7)
Hematocrit: 43.6 % (ref 37.5–51.0)
Immature Grans (Abs): 0 10*3/uL (ref 0.0–0.1)
Immature Granulocytes: 1 %
LYMPHS ABS: 2.8 10*3/uL (ref 0.7–3.1)
Lymphs: 40 %
MCH: 32.6 pg (ref 26.6–33.0)
MCHC: 35.3 g/dL (ref 31.5–35.7)
MCV: 92 fL (ref 79–97)
MONOCYTES: 7 %
Monocytes Absolute: 0.5 10*3/uL (ref 0.1–0.9)
NEUTROS ABS: 3.6 10*3/uL (ref 1.4–7.0)
Neutrophils: 51 %
Platelets: 280 10*3/uL (ref 150–379)
RBC: 4.73 x10E6/uL (ref 4.14–5.80)
RDW: 13 % (ref 12.3–15.4)
WBC: 7.1 10*3/uL (ref 3.4–10.8)

## 2015-12-13 LAB — COMPREHENSIVE METABOLIC PANEL
ALBUMIN: 4.5 g/dL (ref 3.6–4.8)
ALK PHOS: 40 IU/L (ref 39–117)
ALT: 32 IU/L (ref 0–44)
AST: 22 IU/L (ref 0–40)
Albumin/Globulin Ratio: 1.7 (ref 1.2–2.2)
BILIRUBIN TOTAL: 1.3 mg/dL — AB (ref 0.0–1.2)
BUN / CREAT RATIO: 10 (ref 10–24)
BUN: 12 mg/dL (ref 8–27)
CO2: 23 mmol/L (ref 18–29)
CREATININE: 1.21 mg/dL (ref 0.76–1.27)
Calcium: 9.6 mg/dL (ref 8.6–10.2)
Chloride: 101 mmol/L (ref 96–106)
GFR calc Af Amer: 71 mL/min/{1.73_m2} (ref >59)
GFR calc non Af Amer: 61 mL/min/{1.73_m2} (ref >59)
GLOBULIN, TOTAL: 2.7 g/dL (ref 1.5–4.5)
Glucose: 107 mg/dL — ABNORMAL HIGH (ref 65–99)
Potassium: 4.6 mmol/L (ref 3.5–5.2)
SODIUM: 142 mmol/L (ref 134–144)
Total Protein: 7.2 g/dL (ref 6.0–8.5)

## 2015-12-13 LAB — LIPID PANEL
CHOLESTEROL TOTAL: 179 mg/dL (ref 100–199)
Chol/HDL Ratio: 3.7 ratio units (ref 0.0–5.0)
HDL: 48 mg/dL (ref >39)
LDL CALC: 99 mg/dL (ref 0–99)
Triglycerides: 160 mg/dL — ABNORMAL HIGH (ref 0–149)
VLDL Cholesterol Cal: 32 mg/dL (ref 5–40)

## 2015-12-13 LAB — HEPATITIS C ANTIBODY

## 2015-12-13 SURGERY — COLONOSCOPY WITH PROPOFOL
Anesthesia: General

## 2015-12-13 MED ORDER — PHENYLEPHRINE HCL 10 MG/ML IJ SOLN
INTRAMUSCULAR | Status: DC | PRN
  Administered 2015-12-13 (×2): 100 ug via INTRAVENOUS

## 2015-12-13 MED ORDER — SODIUM CHLORIDE 0.9 % IV SOLN: INTRAVENOUS | Status: DC

## 2015-12-13 MED ORDER — PROPOFOL 10 MG/ML IV BOLUS
INTRAVENOUS | Status: DC | PRN
  Administered 2015-12-13: 70 mg via INTRAVENOUS
  Administered 2015-12-13: 10 mg via INTRAVENOUS

## 2015-12-13 MED ORDER — LIDOCAINE HCL (CARDIAC) 20 MG/ML IV SOLN
INTRAVENOUS | Status: DC | PRN
  Administered 2015-12-13: 50 mg via INTRAVENOUS

## 2015-12-13 MED ORDER — SODIUM CHLORIDE 0.9 % IV SOLN
INTRAVENOUS | Status: DC
  Administered 2015-12-13: 13:00:00 via INTRAVENOUS

## 2015-12-13 MED ORDER — PROPOFOL 500 MG/50ML IV EMUL
INTRAVENOUS | Status: DC | PRN
  Administered 2015-12-13: 140 ug/kg/min via INTRAVENOUS

## 2015-12-13 NOTE — Anesthesia Postprocedure Evaluation (Signed)
Anesthesia Post Note  Patient: KARLIN PATRICIO  Procedure(s) Performed: Procedure(s) (LRB): COLONOSCOPY WITH PROPOFOL (N/A)  Patient location during evaluation: PACU Anesthesia Type: General Level of consciousness: awake and alert and oriented Pain management: pain level controlled Vital Signs Assessment: post-procedure vital signs reviewed and stable Respiratory status: spontaneous breathing, nonlabored ventilation and respiratory function stable Cardiovascular status: blood pressure returned to baseline and stable Postop Assessment: no signs of nausea or vomiting Anesthetic complications: no    Last Vitals:  Vitals:   12/13/15 1354 12/13/15 1404  BP: (!) 133/99 (!) 140/95  Pulse: 70 62  Resp: (!) 22 13  Temp:      Last Pain:  Vitals:   12/13/15 1334  TempSrc: Tympanic                 Tanica Gaige

## 2015-12-13 NOTE — H&P (Signed)
Primary Care Physician:  Reginia Forts, MD Primary Gastroenterologist:  Dr. Vira Agar  Pre-Procedure History & Physical: HPI:  Ruben Garcia is a 70 y.o. male is here for an colonoscopy.   Past Medical History:  Diagnosis Date  . Allergic rhinitis, cause unspecified   . Basal cell carcinoma of skin, site unspecified 04/28/2010   Nasal; [name].  [name] Kitchen BPH (benign prostatic hypertrophy)   . Essential hypertension, benign   . Hemorrhoids, internal   . Incomplete bladder emptying   . Other abnormal glucose   . Over weight   . Personal history of colonic polyps   . Personal history of other genital system and obstetric disorders(V13.29)   . Pure hypercholesterolemia   . Unspecified disorder of kidney and ureter   . Unspecified disorder of liver     Past Surgical History:  Procedure Laterality Date  . BASAL CELL CARCINOMA EXCISION  2012   Facial        [place]  . CHOLECYSTECTOMY    . COLONOSCOPY  11/13/2010   single polyp; repeatin 5 years; [place] Gang[place]  . ear surgery  05/2012   [name].  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  Benign.  [name] Kitchen GALLBLADDER SURGERY  1984  . HEMORRHOID SURGERY     [name]  . VASECTOMY      Prior to Admission medications   Medication Sig Start Date End Date Taking? Authorizing Provider  aspirin 81 MG tablet Take 81 mg by mouth daily.   Yes Historical Provider, MD  Calcium Carbonate-Vitamin D (CALCIUM 600+D) 600-200 MG-UNIT TABS Take 1 tablet by mouth daily.   Yes Historical Provider, MD  Garlic 10 MG CAPS Take by mouth.   Yes Historical Provider, MD  ipratropium (ATROVENT) 0.03 % nasal spray Place 2 sprays into the nose 2 (two) times daily. 06/05/15  Yes [name], MD  lisinopril (PRINIVIL,ZESTRIL) 20 MG tablet TAKE ONE TABLET BY MOUTH ONCE DAILY 12/11/15  Yes [name], MD  Multiple Vitamins-Minerals (MULTIVITAMIN PO) Take by mouth daily.   Yes Historical Provider, MD  Omega-3 Fatty Acids (FISH OIL CONCENTRATE) 1000 MG CAPS Take 1 capsule by mouth daily.   Yes  Historical Provider, MD  simvastatin (ZOCOR) 20 MG tablet TAKE ONE TABLET BY MOUTH IN THE EVENING 12/11/15  Yes [name], MD  vitamin B-12 (CYANOCOBALAMIN) 100 MCG tablet Take 100 mcg by mouth daily.   Yes Historical Provider, MD  vitamin C (ASCORBIC ACID) 500 MG tablet Take 500 mg by mouth daily.   Yes Historical Provider, MD  vitamin E 400 UNIT capsule Take 400 Units by mouth daily.   Yes Historical Provider, MD  ondansetron (ZOFRAN ODT) 4 MG disintegrating tablet Take 1 tablet (4 mg total) by mouth every 8 (eight) hours as needed for nausea or vomiting. Patient not taking: Reported on 12/11/2015 07/08/15   Carrie Mew, MD    Allergies as of 11/08/2015 - Review Complete 07/08/2015  Allergen Reaction Noted  . Codeine Nausea Only 05/20/2012    Family History  Problem Relation Age of Onset  . Heart disease Mother   . Diabetes Mother   . Hyperlipidemia Mother   . Arthritis Mother     rheumatoid  . Cancer Father     lung  . Diabetes Father   . Arthritis Sister   . Hyperlipidemia Sister   . Hyperlipidemia Sister   . Hypertension Sister   . Hyperlipidemia Sister   . Kidney disease Neg Hx   . Prostate cancer Neg Hx  Social History   Social History  . Marital status: Married    Spouse name: N/A  . Number of children: 2  . Years of education: college   Occupational History  . retired     Actor in 2005   Social History Main Topics  . Smoking status: Never Smoker  . Smokeless tobacco: Not on file  . Alcohol use Yes     Comment: occasional weekends beer, 4- 5 total Miller Light  . Drug use: No  . Sexual activity: Yes   Other Topics Concern  . Not on file   Social History Narrative   Always uses seat belts. Smoke alarm and carbon monoxide detector in the home. Guns in the home stored in locked cabinet.Caffeine use: Coffee 2 servings per day, moderate amount. Exercise: Moderate walking 2 - 4 miles daily, 2 - 3 days per week.    Marital status:  Married  x 47 years, happily.      Children:  2 children; 1 grandchild (41).      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      Tobacco: never      Alcohol:  Weekends; beer 4-6 per weekend.      Exercise:  Walking every other day 2.5 miles three times per day.      Seatbelt: 100%; no texting      Guns: secured guns.      Living Will: completed in 2014.  FULL CODE but no prolonged measures.        ADLs: independent with ADLs; no assistant devices    Review of Systems: See HPI, otherwise negative ROS  Physical Exam: BP 104/81   Pulse 76   Temp 98.2 F (36.8 C) (Tympanic)   Resp 20   Ht 6' (1.829 m)   Wt 95.3 kg (210 lb)   SpO2 99%   BMI 28.48 kg/m  General:   Alert,  pleasant and cooperative in NAD Head:  Normocephalic and atraumatic. Neck:  Supple; no masses or thyromegaly. Lungs:  Clear throughout to auscultation.    Heart:  Regular rate and rhythm. Abdomen:  Soft, nontender and nondistended. Normal bowel sounds, without guarding, and without rebound.   Neurologic:  Alert and  oriented x4;  grossly normal neurologically.  Impression/Plan: Ruben Garcia is here for an colonoscopy to be performed for [facility] colon polyps  Risks, benefits, limitations, and alternatives regarding  colonoscopy have been reviewed with the patient.  Questions have been answered.  All parties agreeable.   Gaylyn Cheers, MD  12/13/2015, 12:54 PM

## 2015-12-13 NOTE — Anesthesia Procedure Notes (Signed)
Performed by: Demetrius Charity Pre-anesthesia Checklist: Emergency Drugs available, Suction available, Patient identified, Patient being monitored and Timeout performed Patient Re-evaluated:Patient Re-evaluated prior to inductionOxygen Delivery Method: Nasal cannula

## 2015-12-13 NOTE — Transfer of Care (Signed)
Immediate Anesthesia Transfer of Care Note  Patient: MCKINNON GUMMO  Procedure(s) Performed: Procedure(s): COLONOSCOPY WITH PROPOFOL (N/A)  Patient Location: PACU  Anesthesia Type:General  Level of Consciousness: sedated  Airway & Oxygen Therapy: Patient Spontanous Breathing and Patient connected to nasal cannula oxygen  Post-op Assessment: Report given to RN and Post -op Vital signs reviewed and stable  Post vital signs: Reviewed and stable  Last Vitals:  Vitals:   12/13/15 1242  BP: 104/81  Pulse: 76  Resp: 20  Temp: 36.8 C    Last Pain:  Vitals:   12/13/15 1242  TempSrc: Tympanic         Complications: No apparent anesthesia complications

## 2015-12-13 NOTE — Op Note (Signed)
[facility] Gastroenterology Patient Name: Ruben Garcia Procedure Date: 12/13/2015 12:58 PM MRN: [id number] Account #: [id number] Date of Birth: [birth year] Admit Type: Outpatient Age: 70 Room: [facility] ENDO ROOM 1 Gender: Male Note Status: Finalized Procedure:            Colonoscopy Indications:          Colon cancer screening in patient at increased risk:                        Family history of 1st-degree relative with colon polyps Providers:            [name], MD Referring MD:         Renette Butters. Tamala Julian, MD (Referring MD) Medicines:            Propofol per Anesthesia Complications:        No immediate complications. Procedure:            Pre-Anesthesia Assessment:                       - After reviewing the risks and benefits, the patient                        was deemed in satisfactory condition to undergo the                        procedure.                       After obtaining informed consent, the colonoscope was                        passed under direct vision. Throughout the procedure,                        the patient's blood pressure, pulse, and oxygen                        saturations were monitored continuously. The                        Colonoscope was introduced through the anus and                        advanced to the the cecum, identified by appendiceal                        orifice and ileocecal valve. The colonoscopy was                        performed without difficulty. The patient tolerated the                        procedure well. The quality of the bowel preparation                        was good. Findings:      Two sessile polyps were found in the rectum. The polyps were diminutive       in size. These polyps were removed with a jumbo cold forceps. Resection       and retrieval were complete.  Internal hemorrhoids were found during endoscopy. The hemorrhoids were       small and Grade I (internal hemorrhoids that do  not prolapse).      The exam was otherwise without abnormality. Impression:           - Two diminutive polyps in the rectum, removed with a                        jumbo cold forceps. Resected and retrieved.                       - Internal hemorrhoids.                       - The examination was otherwise normal. Recommendation:       - Await pathology results. [name], MD 12/13/2015 1:33:27 PM This report has been signed electronically. Number of Addenda: 0 Note Initiated On: 12/13/2015 12:58 PM Scope Withdrawal Time: 0 hours 14 minutes 23 seconds  Total Procedure Duration: 0 hours 20 minutes 19 seconds       [facility]

## 2015-12-13 NOTE — Anesthesia Preprocedure Evaluation (Signed)
Anesthesia Evaluation  Patient identified by MRN, date of birth, ID band Patient awake    Reviewed: Allergy & Precautions, NPO status , Patient's Chart, lab work & pertinent test results  History of Anesthesia Complications Negative for: history of anesthetic complications  Airway Mallampati: III  TM Distance: >3 FB Neck ROM: Full    Dental no notable dental hx.    Pulmonary neg pulmonary ROS, neg sleep apnea, neg COPD,    breath sounds clear to auscultation- rhonchi (-) decreased breath sounds(-) wheezing      Cardiovascular Exercise Tolerance: Good hypertension, Pt. on medications (-) CAD and (-) Past MI  Rhythm:Regular Rate:Normal - Systolic murmurs and - Diastolic murmurs    Neuro/Psych negative neurological ROS  negative psych ROS   GI/Hepatic negative GI ROS, Neg liver ROS,   Endo/Other  negative endocrine ROSneg diabetes  Renal/GU negative Renal ROS     Musculoskeletal   Abdominal (+) - obese,   Peds  Hematology negative hematology ROS (+)   Anesthesia Other Findings   Reproductive/Obstetrics                             Anesthesia Physical Anesthesia Plan  ASA: II  Anesthesia Plan: General   Post-op Pain Management:    Induction: Intravenous  Airway Management Planned: Natural Airway  Additional Equipment:   Intra-op Plan:   Post-operative Plan:   Informed Consent: I have reviewed the patients History and Physical, chart, labs and discussed the procedure including the risks, benefits and alternatives for the proposed anesthesia with the patient or authorized representative who has indicated his/her understanding and acceptance.   Dental advisory given  Plan Discussed with: CRNA and Anesthesiologist  Anesthesia Plan Comments:         Anesthesia Quick Evaluation

## 2015-12-14 ENCOUNTER — Encounter: Payer: Self-pay | Admitting: Unknown Physician Specialty

## 2015-12-14 LAB — SURGICAL PATHOLOGY

## 2016-03-10 ENCOUNTER — Encounter: Payer: Self-pay | Admitting: Family Medicine

## 2016-03-26 ENCOUNTER — Other Ambulatory Visit: Payer: Self-pay | Admitting: Specialist

## 2016-03-26 DIAGNOSIS — M7542 Impingement syndrome of left shoulder: Secondary | ICD-10-CM

## 2016-03-26 DIAGNOSIS — M7541 Impingement syndrome of right shoulder: Secondary | ICD-10-CM

## 2016-04-04 ENCOUNTER — Ambulatory Visit
Admission: RE | Admit: 2016-04-04 | Discharge: 2016-04-04 | Disposition: A | Discharge status: Home / Self Care | Payer: Medicare Other | Source: Ambulatory Visit | Attending: Specialist | Admitting: Specialist

## 2016-04-04 DIAGNOSIS — M7541 Impingement syndrome of right shoulder: Secondary | ICD-10-CM | POA: Insufficient documentation

## 2016-04-04 DIAGNOSIS — M75101 Unspecified rotator cuff tear or rupture of right shoulder, not specified as traumatic: Secondary | ICD-10-CM | POA: Insufficient documentation

## 2016-04-08 DIAGNOSIS — M75120 Complete rotator cuff tear or rupture of unspecified shoulder, not specified as traumatic: Secondary | ICD-10-CM | POA: Insufficient documentation

## 2016-04-08 DIAGNOSIS — M7540 Impingement syndrome of unspecified shoulder: Secondary | ICD-10-CM | POA: Insufficient documentation

## 2016-04-22 ENCOUNTER — Other Ambulatory Visit: Payer: Self-pay | Admitting: Specialist

## 2016-05-05 ENCOUNTER — Encounter
Admission: RE | Admit: 2016-05-05 | Discharge: 2016-05-05 | Disposition: A | Discharge status: Home / Self Care | Payer: Federal, State, Local not specified - PPO | Source: Ambulatory Visit | Attending: Specialist | Admitting: Specialist

## 2016-05-05 DIAGNOSIS — Z01818 Encounter for other preprocedural examination: Secondary | ICD-10-CM | POA: Insufficient documentation

## 2016-05-05 LAB — SURGICAL PCR SCREEN
MRSA, PCR: NEGATIVE
STAPHYLOCOCCUS AUREUS: NEGATIVE

## 2016-05-05 NOTE — Patient Instructions (Signed)
  Your procedure is scheduled on: Wed.05/14/16 Report to Day Surgery. To find out your arrival time please call [phone] between 1PM - 3PM on Tues. 05/13/16.  Remember: Instructions that are not followed completely may result in serious medical risk, up to and including death, or upon the discretion of your surgeon and anesthesiologist your surgery may need to be rescheduled.    __x__ 1. Do not eat food or drink liquids after midnight. No gum chewing or hard candies.     ___x_ 2. No Alcohol for 24 hours before or after surgery.   ____ 3. Do Not Smoke For 24 Hours Prior to Your Surgery.   ____ 4. Bring all medications with you on the day of surgery if instructed.    __x__ 5. Notify your doctor if there is any change in your medical condition     (cold, fever, infections).       Do not wear jewelry, make-up, hairpins, clips or nail polish.  Do not wear lotions, powders, or perfumes. You may wear deodorant.  Do not shave 48 hours prior to surgery. Men may shave face and neck.  Do not bring valuables to the hospital.    [facility] is not responsible for any belongings or valuables.               Contacts, dentures or bridgework may not be worn into surgery.  Leave your suitcase in the car. After surgery it may be brought to your room.  For patients admitted to the hospital, discharge time is determined by your                treatment team.   Patients discharged the day of surgery will not be allowed to drive home.   Please read over the following fact sheets that you were given:   MRSA Information   __ _ Take these medicines the morning of surgery with A SIP OF WATER:    1.   2.   3.   4.  5.  6.  ____ Fleet Enema (as directed)   _x___ Use CHG Soap as directed  ____ Use inhalers on the day of surgery   ____ Stop metformin 2 days prior to surgery    ____ Take 1/2 of usual insulin dose the night before surgery and none on the morning of surgery.   __x__ Stop  aspirin on  1/10  _x___ Stop Anti-inflammatories on 1/10 Ibuprofen, Aleve,BC   __x__ Stop supplements until after surgery Garlic 10 MG CAPS,Omega-3 Fatty Acids (FISH OIL CONCENTRATE) 1000 MG CAPS,vitamin C (ASCORBIC ACID) 500 MG tablet, vitamin E 400 UNIT capsule  ____ Bring C-Pap to the hospital.

## 2016-05-08 ENCOUNTER — Telehealth: Payer: Self-pay

## 2016-05-08 NOTE — Telephone Encounter (Signed)
Patient called numerous times today upset and angry that Dr Tamala Julian would not clear him for surgrey, apparently patient had been talking with Sharee Pimple and she had told him that since he had not been seen since 11/2015 he would have to come in and be see before we could clear him. He was very rude and said he would not speak with Sharee Pimple anymore and that he would not come in for an appointment. I let him speak with Mickel Baas.  Patient called back later in the day still complaining because we would not see him, we did make him an appointment finally for Tuesday 05/13/16 at 10:45 with Dr Tamala Julian. Patient then called back right before we closed and asked that Dr Tamala Julian be taken out of his chart as his PCP because he did not want her to be responsible for his care anymore and that he would not be coming back here to see her anymore.

## 2016-05-08 NOTE — Telephone Encounter (Signed)
Noted.  Pt warrants pre-operative clearance visit prior to surgery.  Last visit with me five months ago.  Agree with recommendations of clinical staff.  No further action warranted.

## 2016-05-08 NOTE — Pre-Procedure Instructions (Signed)
CALL FROM Evellyn Tuff AT [facility]. PATIENT DID NOT WANT TO SEE PCP AGAIN PREOP. HAD CPE 8/17 WITH NSR EKG AND METC/CBC. NO CLEARANCE REQUESTED BY ANESTHESIA

## 2016-05-13 ENCOUNTER — Telehealth: Payer: Self-pay | Admitting: Emergency Medicine

## 2016-05-13 ENCOUNTER — Ambulatory Visit: Payer: Federal, State, Local not specified - PPO | Admitting: Family Medicine

## 2016-05-13 NOTE — Telephone Encounter (Signed)
Followed up with patient today regarding medical clearance appointment. Pt states, Dr. Sabra Heck cleared him for surgery scheduled on 05/14/16 and he will not be returning to clinic.  Pt is very upset that he was seen in August and should've been cleared based off of visit. He will look for new provider in [place].   Message routed to Dr. Tamala Julian

## 2016-05-14 ENCOUNTER — Encounter
Admission: RE | Disposition: A | Discharge status: Home / Self Care | Payer: Self-pay | Source: Ambulatory Visit | Attending: Specialist

## 2016-05-14 ENCOUNTER — Ambulatory Visit
Admission: RE | Admit: 2016-05-14 | Discharge: 2016-05-14 | Disposition: A | Discharge status: Home / Self Care | Payer: Federal, State, Local not specified - PPO | Source: Ambulatory Visit | Attending: Specialist | Admitting: Specialist

## 2016-05-14 ENCOUNTER — Ambulatory Visit: Payer: Federal, State, Local not specified - PPO | Admitting: Anesthesiology

## 2016-05-14 DIAGNOSIS — M75121 Complete rotator cuff tear or rupture of right shoulder, not specified as traumatic: Secondary | ICD-10-CM | POA: Diagnosis not present

## 2016-05-14 DIAGNOSIS — M7551 Bursitis of right shoulder: Secondary | ICD-10-CM | POA: Diagnosis not present

## 2016-05-14 DIAGNOSIS — M25811 Other specified joint disorders, right shoulder: Secondary | ICD-10-CM | POA: Insufficient documentation

## 2016-05-14 DIAGNOSIS — M19011 Primary osteoarthritis, right shoulder: Secondary | ICD-10-CM | POA: Diagnosis not present

## 2016-05-14 HISTORY — PX: OTHER SURGICAL HISTORY: SHX169

## 2016-05-14 SURGERY — SHOULDER ARTHROSCOPY WITH SUBACROMIAL DECOMPRESSION AND DISTAL CLAVICLE EXCISION
Anesthesia: General | Site: Shoulder | Laterality: Right | Wound class: Clean

## 2016-05-14 MED ORDER — PHENYLEPHRINE 40 MCG/ML (10ML) SYRINGE FOR IV PUSH (FOR BLOOD PRESSURE SUPPORT)
PREFILLED_SYRINGE | INTRAVENOUS | Status: AC
  Filled 2016-05-14: qty 10

## 2016-05-14 MED ORDER — PHENYLEPHRINE HCL 10 MG/ML IJ SOLN
INTRAMUSCULAR | Status: DC | PRN
  Administered 2016-05-14: 40 ug via INTRAVENOUS
  Administered 2016-05-14: 80 ug via INTRAVENOUS

## 2016-05-14 MED ORDER — BUPIVACAINE-EPINEPHRINE (PF) 0.25% -1:200000 IJ SOLN
INTRAMUSCULAR | Status: DC | PRN
  Administered 2016-05-14: 30 mL via PERINEURAL

## 2016-05-14 MED ORDER — ROPIVACAINE HCL 5 MG/ML IJ SOLN
INTRAMUSCULAR | Status: DC | PRN
  Administered 2016-05-14: 25 mL via EPIDURAL

## 2016-05-14 MED ORDER — GABAPENTIN 400 MG PO CAPS: 400.0000 mg | ORAL_CAPSULE | Freq: Two times a day (BID) | ORAL | 3 refills | Status: DC

## 2016-05-14 MED ORDER — FAMOTIDINE 20 MG PO TABS
20.0000 mg | ORAL_TABLET | Freq: Once | ORAL | Status: AC
  Administered 2016-05-14: 20 mg via ORAL

## 2016-05-14 MED ORDER — DEXAMETHASONE SODIUM PHOSPHATE 10 MG/ML IJ SOLN
INTRAMUSCULAR | Status: DC | PRN
  Administered 2016-05-14: 5 mg via INTRAVENOUS

## 2016-05-14 MED ORDER — EPHEDRINE 5 MG/ML INJ
INTRAVENOUS | Status: AC
  Filled 2016-05-14: qty 20

## 2016-05-14 MED ORDER — MELOXICAM 7.5 MG PO TABS
15.0000 mg | ORAL_TABLET | Freq: Once | ORAL | Status: AC
  Administered 2016-05-14: 15 mg via ORAL

## 2016-05-14 MED ORDER — FAMOTIDINE 20 MG PO TABS
ORAL_TABLET | ORAL | Status: AC
  Administered 2016-05-14: 20 mg via ORAL
  Filled 2016-05-14: qty 1

## 2016-05-14 MED ORDER — MIDAZOLAM HCL 2 MG/2ML IJ SOLN
INTRAMUSCULAR | Status: AC
  Administered 2016-05-14: 1 mg via INTRAVENOUS
  Filled 2016-05-14: qty 2

## 2016-05-14 MED ORDER — MIDAZOLAM HCL 2 MG/2ML IJ SOLN
1.0000 mg | Freq: Once | INTRAMUSCULAR | Status: AC
Start: 2016-05-14 — End: 2016-05-14
  Administered 2016-05-14: 1 mg via INTRAVENOUS

## 2016-05-14 MED ORDER — LACTATED RINGERS IV SOLN
INTRAVENOUS | Status: DC
  Administered 2016-05-14 (×2): via INTRAVENOUS

## 2016-05-14 MED ORDER — ESMOLOL HCL 100 MG/10ML IV SOLN
INTRAVENOUS | Status: AC
  Filled 2016-05-14: qty 10

## 2016-05-14 MED ORDER — CLINDAMYCIN PHOSPHATE 600 MG/50ML IV SOLN
600.0000 mg | INTRAVENOUS | Status: AC
Start: 2016-05-14 — End: 2016-05-14
  Administered 2016-05-14: 600 mg via INTRAVENOUS

## 2016-05-14 MED ORDER — PROPOFOL 10 MG/ML IV BOLUS
INTRAVENOUS | Status: AC
  Filled 2016-05-14: qty 20

## 2016-05-14 MED ORDER — LIDOCAINE HCL (PF) 1 % IJ SOLN
INTRAMUSCULAR | Status: AC
  Filled 2016-05-14: qty 5

## 2016-05-14 MED ORDER — PROPOFOL 10 MG/ML IV BOLUS
INTRAVENOUS | Status: DC | PRN
  Administered 2016-05-14: 50 mg via INTRAVENOUS
  Administered 2016-05-14: 100 mg via INTRAVENOUS

## 2016-05-14 MED ORDER — EPINEPHRINE 30 MG/30ML IJ SOLN
INTRAMUSCULAR | Status: AC
  Filled 2016-05-14: qty 1

## 2016-05-14 MED ORDER — METHOCARBAMOL 500 MG PO TABS: 500.0000 mg | ORAL_TABLET | Freq: Four times a day (QID) | ORAL | 1 refills | Status: DC

## 2016-05-14 MED ORDER — CLINDAMYCIN PHOSPHATE 600 MG/50ML IV SOLN
INTRAVENOUS | Status: AC
  Filled 2016-05-14: qty 50

## 2016-05-14 MED ORDER — SUCCINYLCHOLINE CHLORIDE 20 MG/ML IJ SOLN
INTRAMUSCULAR | Status: DC | PRN
  Administered 2016-05-14: 140 mg via INTRAVENOUS

## 2016-05-14 MED ORDER — ONDANSETRON HCL 4 MG/2ML IJ SOLN
INTRAMUSCULAR | Status: DC | PRN
  Administered 2016-05-14: 4 mg via INTRAVENOUS

## 2016-05-14 MED ORDER — ONDANSETRON HCL 4 MG/2ML IJ SOLN
INTRAMUSCULAR | Status: AC
  Filled 2016-05-14: qty 2

## 2016-05-14 MED ORDER — PHENYLEPHRINE HCL 10 MG/ML IJ SOLN
INTRAMUSCULAR | Status: DC | PRN
  Administered 2016-05-14 (×5): 40 ug via INTRAVENOUS

## 2016-05-14 MED ORDER — LIDOCAINE 2% (20 MG/ML) 5 ML SYRINGE
INTRAMUSCULAR | Status: AC
  Filled 2016-05-14: qty 5

## 2016-05-14 MED ORDER — ONDANSETRON HCL 4 MG/2ML IJ SOLN: 4.0000 mg | Freq: Once | INTRAMUSCULAR | Status: DC | PRN

## 2016-05-14 MED ORDER — DEXAMETHASONE SODIUM PHOSPHATE 10 MG/ML IJ SOLN
INTRAMUSCULAR | Status: AC
  Filled 2016-05-14: qty 1

## 2016-05-14 MED ORDER — HYDROCODONE-ACETAMINOPHEN 7.5-325 MG PO TABS: 1.0000 | ORAL_TABLET | Freq: Four times a day (QID) | ORAL | 0 refills | Status: DC | PRN

## 2016-05-14 MED ORDER — CEFAZOLIN SODIUM-DEXTROSE 2-4 GM/100ML-% IV SOLN
INTRAVENOUS | Status: AC
  Filled 2016-05-14: qty 100

## 2016-05-14 MED ORDER — ESMOLOL HCL 100 MG/10ML IV SOLN
INTRAVENOUS | Status: DC | PRN
  Administered 2016-05-14 (×2): 10 mg via INTRAVENOUS
  Administered 2016-05-14: 30 mg via INTRAVENOUS

## 2016-05-14 MED ORDER — SUCCINYLCHOLINE CHLORIDE 200 MG/10ML IV SOSY
PREFILLED_SYRINGE | INTRAVENOUS | Status: AC
Start: 2016-05-14 — End: 2016-05-14
  Filled 2016-05-14: qty 10

## 2016-05-14 MED ORDER — BUPIVACAINE-EPINEPHRINE (PF) 0.25% -1:200000 IJ SOLN
INTRAMUSCULAR | Status: AC
  Filled 2016-05-14: qty 30

## 2016-05-14 MED ORDER — EPINEPHRINE PF 1 MG/ML IJ SOLN
INTRAMUSCULAR | Status: DC | PRN
  Administered 2016-05-14: 18 mL

## 2016-05-14 MED ORDER — MIDAZOLAM HCL 2 MG/2ML IJ SOLN
INTRAMUSCULAR | Status: DC | PRN
Start: 2016-05-14 — End: 2016-05-14
  Administered 2016-05-14: 2 mg via INTRAVENOUS

## 2016-05-14 MED ORDER — FENTANYL CITRATE (PF) 100 MCG/2ML IJ SOLN
50.0000 ug | Freq: Once | INTRAMUSCULAR | Status: AC
  Administered 2016-05-14: 50 ug via INTRAVENOUS

## 2016-05-14 MED ORDER — FENTANYL CITRATE (PF) 100 MCG/2ML IJ SOLN
INTRAMUSCULAR | Status: AC
  Administered 2016-05-14: 50 ug via INTRAVENOUS
  Filled 2016-05-14: qty 2

## 2016-05-14 MED ORDER — MIDAZOLAM HCL 2 MG/2ML IJ SOLN
INTRAMUSCULAR | Status: AC
  Filled 2016-05-14: qty 2

## 2016-05-14 MED ORDER — LIDOCAINE HCL 2 % EX GEL
CUTANEOUS | Status: AC
  Filled 2016-05-14: qty 5

## 2016-05-14 MED ORDER — EPHEDRINE SULFATE 50 MG/ML IJ SOLN
INTRAMUSCULAR | Status: DC | PRN
  Administered 2016-05-14 (×5): 10 mg via INTRAVENOUS

## 2016-05-14 MED ORDER — MELOXICAM 15 MG PO TABS: 15.0000 mg | ORAL_TABLET | Freq: Every day | ORAL | 3 refills | Status: DC

## 2016-05-14 MED ORDER — MELOXICAM 7.5 MG PO TABS
ORAL_TABLET | ORAL | Status: AC
  Administered 2016-05-14: 15 mg via ORAL
  Filled 2016-05-14: qty 2

## 2016-05-14 MED ORDER — SUGAMMADEX SODIUM 200 MG/2ML IV SOLN
INTRAVENOUS | Status: AC
  Filled 2016-05-14: qty 2

## 2016-05-14 MED ORDER — CEFAZOLIN SODIUM-DEXTROSE 2-4 GM/100ML-% IV SOLN
2.0000 g | INTRAVENOUS | Status: AC
  Administered 2016-05-14: 2 g via INTRAVENOUS

## 2016-05-14 MED ORDER — ROPIVACAINE HCL 5 MG/ML IJ SOLN
INTRAMUSCULAR | Status: AC
  Filled 2016-05-14: qty 40

## 2016-05-14 MED ORDER — MORPHINE SULFATE (PF) 4 MG/ML IV SOLN
INTRAVENOUS | Status: DC | PRN
  Administered 2016-05-14: 4 mg via INTRAVENOUS

## 2016-05-14 MED ORDER — ROCURONIUM BROMIDE 50 MG/5ML IV SOSY
PREFILLED_SYRINGE | INTRAVENOUS | Status: AC
  Filled 2016-05-14: qty 5

## 2016-05-14 MED ORDER — CHLORHEXIDINE GLUCONATE CLOTH 2 % EX PADS: 6.0000 | MEDICATED_PAD | Freq: Once | CUTANEOUS | Status: DC

## 2016-05-14 MED ORDER — FENTANYL CITRATE (PF) 100 MCG/2ML IJ SOLN: 25.0000 ug | INTRAMUSCULAR | Status: DC | PRN

## 2016-05-14 MED ORDER — FENTANYL CITRATE (PF) 100 MCG/2ML IJ SOLN
INTRAMUSCULAR | Status: DC | PRN
  Administered 2016-05-14 (×2): 50 ug via INTRAVENOUS

## 2016-05-14 MED ORDER — EPHEDRINE SULFATE 50 MG/ML IJ SOLN
INTRAMUSCULAR | Status: DC | PRN
  Administered 2016-05-14 (×2): 10 mg via INTRAVENOUS

## 2016-05-14 MED ORDER — GLYCOPYRROLATE 0.2 MG/ML IJ SOLN
INTRAMUSCULAR | Status: AC
  Filled 2016-05-14: qty 1

## 2016-05-14 MED ORDER — GABAPENTIN 400 MG PO CAPS
ORAL_CAPSULE | ORAL | Status: AC
  Administered 2016-05-14: 400 mg via ORAL
  Filled 2016-05-14: qty 1

## 2016-05-14 MED ORDER — CHLORHEXIDINE GLUCONATE CLOTH 2 % EX PADS
6.0000 | MEDICATED_PAD | Freq: Once | CUTANEOUS | Status: DC
Start: 2016-05-14 — End: 2016-05-14

## 2016-05-14 MED ORDER — NEOMYCIN-POLYMYXIN B GU 40-200000 IR SOLN
Status: AC
  Filled 2016-05-14: qty 2

## 2016-05-14 MED ORDER — ROCURONIUM BROMIDE 100 MG/10ML IV SOLN
INTRAVENOUS | Status: DC | PRN
  Administered 2016-05-14: 10 mg via INTRAVENOUS
  Administered 2016-05-14: 30 mg via INTRAVENOUS

## 2016-05-14 MED ORDER — SUGAMMADEX SODIUM 200 MG/2ML IV SOLN
INTRAVENOUS | Status: DC | PRN
  Administered 2016-05-14: 200 mg via INTRAVENOUS

## 2016-05-14 MED ORDER — FENTANYL CITRATE (PF) 100 MCG/2ML IJ SOLN
INTRAMUSCULAR | Status: AC
  Filled 2016-05-14: qty 2

## 2016-05-14 MED ORDER — MORPHINE SULFATE (PF) 4 MG/ML IV SOLN
INTRAVENOUS | Status: AC
  Filled 2016-05-14: qty 1

## 2016-05-14 MED ORDER — GABAPENTIN 400 MG PO CAPS
400.0000 mg | ORAL_CAPSULE | Freq: Once | ORAL | Status: AC
  Administered 2016-05-14: 400 mg via ORAL

## 2016-05-14 SURGICAL SUPPLY — 51 items
ADAPTER IRRIG TUBE 2 SPIKE SOL (ADAPTER) ×3 IMPLANT
ANCHOR SUT 5.5 MULTIFIX (Orthopedic Implant) ×3 IMPLANT
BLADE AGGRESSIVE PLUS 4.0 (BLADE) IMPLANT
BUR AGGRESSIVE+ 5.5 (BURR) IMPLANT
BUR BR 5.5 12 FLUTE (BURR) ×3 IMPLANT
BUR RADIUS 4.0X18.5 (BURR) ×3 IMPLANT
BUR RADIUS 5.5 (BURR) IMPLANT
CANNULA 5.75X7 CRYSTAL CLEAR (CANNULA) IMPLANT
CANNULA 8.5X75 THRED (CANNULA) IMPLANT
CANNULA PARTIAL THREAD 2X7 (CANNULA) IMPLANT
CHLORAPREP W/TINT 26ML (MISCELLANEOUS) ×3 IMPLANT
CONNECTOR PERFECT PASSER (CONNECTOR) IMPLANT
COVER MAYO STAND STRL (DRAPES) ×3 IMPLANT
DRAPE IMP U-DRAPE 54X76 (DRAPES) ×3 IMPLANT
DRAPE SHEET LG 3/4 BI-LAMINATE (DRAPES) ×3 IMPLANT
DRAPE STERI 35X30 U-POUCH (DRAPES) ×3 IMPLANT
GAUZE PETRO XEROFOAM 1X8 (MISCELLANEOUS) ×3 IMPLANT
GAUZE SPONGE 4X4 12PLY STRL (GAUZE/BANDAGES/DRESSINGS) ×3 IMPLANT
GLOVE SURG ORTHO 8.0 STRL STRW (GLOVE) ×3 IMPLANT
GOWN STRL REUS W/ TWL LRG LVL4 (GOWN DISPOSABLE) ×2 IMPLANT
GOWN STRL REUS W/TWL LRG LVL4 (GOWN DISPOSABLE) ×1
IV LACTATED RINGER IRRG 3000ML (IV SOLUTION) ×18
IV LR IRRIG 3000ML ARTHROMATIC (IV SOLUTION) ×36 IMPLANT
KIT RM TURNOVER STRD PROC AR (KITS) ×3 IMPLANT
KIT SHOULDER TRACTION (DRAPES) ×3 IMPLANT
MANIFOLD NEPTUNE II (INSTRUMENTS) ×3 IMPLANT
MAT BLUE FLOOR 46X72 FLO (MISCELLANEOUS) ×3 IMPLANT
NDL SAFETY 18GX1.5 (NEEDLE) ×3 IMPLANT
NEEDLE SPNL 18GX3.5 QUINCKE PK (NEEDLE) ×3 IMPLANT
NS IRRIG 500ML POUR BTL (IV SOLUTION) ×3 IMPLANT
PACK ARTHROSCOPY SHOULDER (MISCELLANEOUS) ×3 IMPLANT
PASSER SUT CAPTURE FIRST (SUTURE) ×3 IMPLANT
SET TUBE SUCT SHAVER OUTFL 24K (TUBING) ×3 IMPLANT
SET TUBE TIP INTRA-ARTICULAR (MISCELLANEOUS) ×3 IMPLANT
SLING ULTRA II LG (MISCELLANEOUS) ×3 IMPLANT
SOL PREP PVP 2OZ (MISCELLANEOUS) ×3
SOLUTION PREP PVP 2OZ (MISCELLANEOUS) ×2 IMPLANT
SUT ETHILON 3 0 FSLX (SUTURE) ×3 IMPLANT
SUT PDS PLUS 0 (SUTURE)
SUT PDS PLUS AB 0 CT-2 (SUTURE) IMPLANT
SUT PERFECTPASSER WHITE CART (SUTURE) IMPLANT
SUT SMART STITCH CARTRIDGE (SUTURE) IMPLANT
SUT VIC AB 2-0 CT2 27 (SUTURE) ×3 IMPLANT
SUT VICRYL 3-0 27IN (SUTURE) ×3 IMPLANT
SUTURE MAGNUM WIRE 2X48 BLK (SUTURE) ×6 IMPLANT
SYR 20CC LL (SYRINGE) ×3 IMPLANT
SYR 30ML LL (SYRINGE) ×3 IMPLANT
SYR 50ML LL SCALE MARK (SYRINGE) ×3 IMPLANT
TUBING ARTHRO INFLOW-ONLY STRL (TUBING) ×3 IMPLANT
TUBING CONNECTING 10 (TUBING) ×3 IMPLANT
WAND HAND CNTRL MULTIVAC 90 (MISCELLANEOUS) ×3 IMPLANT

## 2016-05-14 NOTE — Anesthesia Preprocedure Evaluation (Signed)
Anesthesia Evaluation  Patient identified by MRN, date of birth, ID band Patient awake    Reviewed: Allergy & Precautions, NPO status , Patient's Chart, lab work & pertinent test results, reviewed documented beta blocker date and time   Airway Mallampati: III  TM Distance: >3 FB     Dental  (+) Chipped   Pulmonary           Cardiovascular hypertension, Pt. on medications      Neuro/Psych    GI/Hepatic   Endo/Other    Renal/GU      Musculoskeletal   Abdominal   Peds  Hematology   Anesthesia Other Findings   Reproductive/Obstetrics                             Anesthesia Physical Anesthesia Plan  ASA: III  Anesthesia Plan: General   Post-op Pain Management:    Induction: Intravenous  Airway Management Planned: Oral ETT  Additional Equipment:   Intra-op Plan:   Post-operative Plan:   Informed Consent: I have reviewed the patients History and Physical, chart, labs and discussed the procedure including the risks, benefits and alternatives for the proposed anesthesia with the patient or authorized representative who has indicated his/her understanding and acceptance.     Plan Discussed with: CRNA  Anesthesia Plan Comments:         Anesthesia Quick Evaluation

## 2016-05-14 NOTE — Anesthesia Procedure Notes (Signed)
Anesthesia Regional Block:  Interscalene brachial plexus block  Pre-Anesthetic Checklist: ,, timeout performed, Correct Patient, Correct Site, Correct Laterality, Correct Procedure, Correct Position, site marked, Risks and benefits discussed,  Surgical consent,  Pre-op evaluation,  At surgeon's request and post-op pain management   Prep: Betadine       Needles:  Injection technique: Single-shot  Needle Type: Echogenic Stimulator Needle     Needle Length: 5cm 5 cm Needle Gauge: 21 and 21 G    Additional Needles:  Procedures: ultrasound guided (picture in chart) and nerve stimulator Interscalene brachial plexus block  Nerve Stimulator or Paresthesia:  Response: biceps flexion, 0.8 mA,   Additional Responses:   Narrative:  Injection made incrementally with aspirations every 5 mL.  Performed by: Personally  Anesthesiologist: Gunnar Bulla  Additional Notes: Functioning IV was confirmed and monitors were applied.  A 11mm 22ga Arrow echogenic stimulator needle was used. Sterile prep and drape,hand hygiene and sterile gloves were used.  Negative aspiration and negative test dose prior to incremental administration of local anesthetic. The patient tolerated the procedure well.  Ultrasound guidance: relevent anatomy identified, needle position confirmed, local anesthetic spread visualized around nerve(s), vascular puncture avoided.  Image printed for medical record. 0735 ropivicaine 0.5% 14ml.

## 2016-05-14 NOTE — Discharge Instructions (Signed)

## 2016-05-14 NOTE — H&P (Signed)
THE PATIENT WAS SEEN PRIOR TO SURGERY TODAY.  HISTORY, ALLERGIES, HOME MEDICATIONS AND OPERATIVE PROCEDURE WERE REVIEWED. RISKS AND BENEFITS OF SURGERY DISCUSSED WITH PATIENT AGAIN.  NO CHANGES FROM INITIAL HISTORY AND PHYSICAL NOTED.    

## 2016-05-14 NOTE — Transfer of Care (Signed)
Immediate Anesthesia Transfer of Care Note  Patient: Ruben Garcia  Procedure(s) Performed: Procedure(s): SHOULDER ARTHROSCOPY WITH SUBACROMIAL DECOMPRESSION AND DISTAL CLAVICLE EXCISION (Right)  Patient Location: PACU  Anesthesia Type:General  Level of Consciousness: awake, alert  and oriented  Airway & Oxygen Therapy: Patient Spontanous Breathing and Patient connected to face mask oxygen  Post-op Assessment: Report given to RN and Post -op Vital signs reviewed and stable  Post vital signs: Reviewed and stable  Last Vitals:  Vitals:   05/14/16 1102 05/14/16 1117  BP: (!) 145/87 (!) 150/80  Pulse: 88 94  Resp: 19 18  Temp: 36.4 C     Last Pain:  Vitals:   05/14/16 1117  TempSrc:   PainSc: 0-No pain         Complications: No apparent anesthesia complications

## 2016-05-14 NOTE — Anesthesia Procedure Notes (Signed)
Procedure Name: Intubation Date/Time: 05/14/2016 8:01 AM Performed by: Jennette Bill Pre-anesthesia Checklist: Patient identified, Emergency Drugs available, Suction available, Patient being monitored and Timeout performed Patient Re-evaluated:Patient Re-evaluated prior to inductionOxygen Delivery Method: Circle system utilized Preoxygenation: Pre-oxygenation with 100% oxygen Intubation Type: IV induction Ventilation: Mask ventilation without difficulty Laryngoscope Size: Mac and 4 Grade View: Grade II Tube type: Oral Tube size: 7.5 mm Number of attempts: 1 Airway Equipment and Method: Patient positioned with wedge pillow Placement Confirmation: ETT inserted through vocal cords under direct vision Secured at: 21 cm Tube secured with: Tape Dental Injury: Teeth and Oropharynx as per pre-operative assessment

## 2016-05-14 NOTE — Anesthesia Postprocedure Evaluation (Signed)
Anesthesia Post Note  Patient: Ruben Garcia  Procedure(s) Performed: Procedure(s) (LRB): SHOULDER ARTHROSCOPY WITH SUBACROMIAL DECOMPRESSION AND DISTAL CLAVICLE EXCISION (Right)  Anesthesia Type: General Comments: Pt comfortable. No respiratory problems. Saturations in the 93 - 96 range. OK to go.     Last Vitals:  Vitals:   05/14/16 1150 05/14/16 1229  BP: 140/73 (!) 151/82  Pulse: 96   Resp: 18   Temp: (!) 36.1 C     Last Pain:  Vitals:   05/14/16 1146  TempSrc:   PainSc: 0-No pain                 Jerelyn Trimarco S

## 2016-05-14 NOTE — Op Note (Signed)
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    The risks benefits and alternatives were discussed with the patient preoperatively including but not limited to the risks of infection, bleeding, nerve injury, cardiopulmonary complications, the need for revision surgery, among others, and the patient was willing to proceed.  OPERATIVE IMPLANTS: 1 MULTIFIX ANCHOR  OPERATIVE FINDINGS: The patient had severe subacromial bursitis and impingement. The anterior acromion was very tight and  prominent. The ACjoint was arthritic. The glenohumeral joint was intact. The biceps tendon was normal. Mild fraying of the anterior labrum. 2 cm tear of anterior supraspinatus with retraction.   OPERATIVE PROCEDURE: The patient was brought to the operating room where satisfactory general endotracheal and interscalene anesthesia were accomplished.The patient was turned into the lateral decubitus position and the shoulder was prepped and draped in a sterile fashion. Arthroscopy was carried out from a posterior portal with accessory portals laterally and anteriorly. The  joint was examined first. The above findings were encountered. The motorized shaver was introduced anteriorly and the undersurface of the rotator cuff probed.. The biceps tendon was intact and left alone.  The labrum was trimmed up with the ArthroCare wand. The arthroscope was redirected  into the subacromial space. There was severe bursitis which was resected with the motorized shaver and ArthroCare wand. The large bur was introduced from a posterior portal and the anterior acromion was debrided. The undersurface of the clavicle was debrided with the bur which was then reintroduced from an anterior portal and the remaining distal clavicle completely excised.  The cuff tear was examined and probed.  Soft tissue was debrided off the tuberosity and a bur used to touch up the bone lightly.  2 Magnum wire sutures were passed through the forearm portion of the cuff and brought out laterally.  There were passed through a multi fix anchor, which was then pushed down into the joint.  A pilot hole had been made previously and the anchor was placed in this.  Traction was reduced from 12 pounds to 5 pounds.  The anchor was introduced Foley in and then deployed into the bone, pulling the rotator cuff over nicely.  Sutures were cut.  The tuberosity was completely covered by the rotator cuff repair.  There was good motion.  Final debridement was carried out with the motorized shaver and the ArthroCare wand. The joint was flushed and the stab wounds and closed with 3-0 nylon suture. 1/4% Marcaine with morphine was injected into the shoulder. Sponge and needle counts were correct.   The dry sterile dressing and was applied along with a TENS unit and padded sling. Patient was awakened and taken recovery in good condition.   Ruben Garcia.D.

## 2016-05-14 NOTE — Anesthesia Postprocedure Evaluation (Signed)
Anesthesia Post Note  Patient: Ruben Garcia  Procedure(s) Performed: Procedure(s) (LRB): SHOULDER ARTHROSCOPY WITH SUBACROMIAL DECOMPRESSION AND DISTAL CLAVICLE EXCISION (Right)  Anesthesia Type: General     Last Vitals:  Vitals:   05/14/16 1150 05/14/16 1229  BP: 140/73 (!) 151/82  Pulse: 96   Resp: 18   Temp: (!) 36.1 C     Last Pain:  Vitals:   05/14/16 1146  TempSrc:   PainSc: 0-No pain                 Beyounce Dickens S

## 2016-05-15 NOTE — Telephone Encounter (Signed)
Noted. Per my protocol, pt needs pre-operative clearance visit prior to surgery.  Last visit with me was five months ago.  I need follow-up prior to surgery to rule out any cardiac symptoms and to evaluate current exercise capacity.  I regret his frustration with this policy, yet I am looking out for his best medical interest.

## 2016-05-29 IMAGING — US US EXTREM LOW VENOUS*R*
1 series · 13 of 24 positions shown · non-contrast
Comparison: None.

CLINICAL DATA: Right lower extremity pain and swelling.  Redness.



[Series 1: us extrem low venous*right* · 0.08mm/px · 13 of 33 slices shown]
[im 1/33]
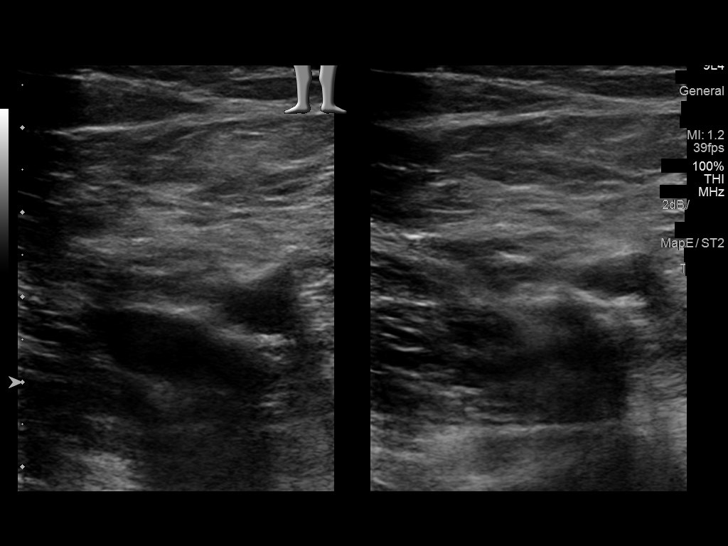
[im 3/33]
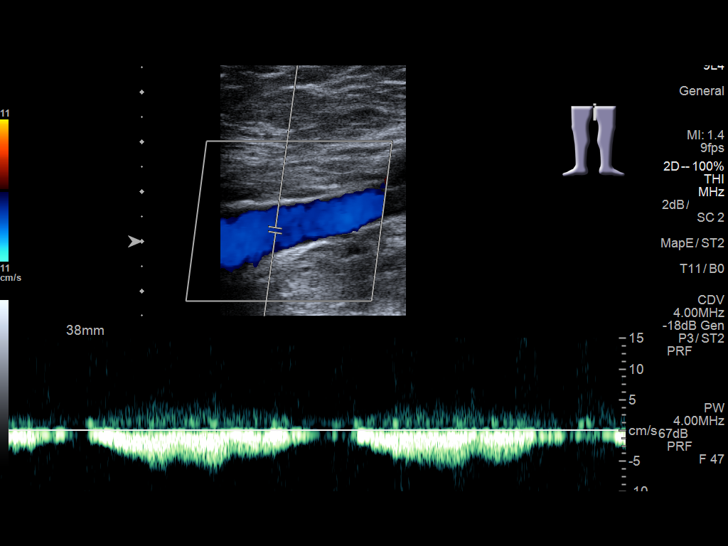
[im 6/33]
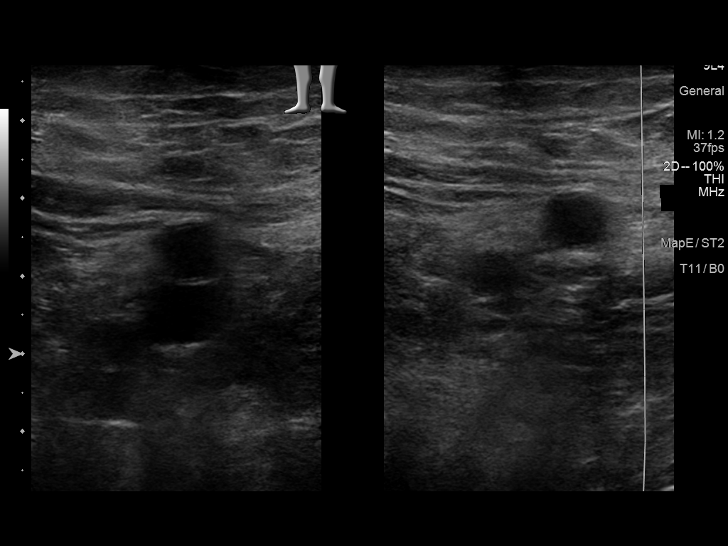
[im 9/33]
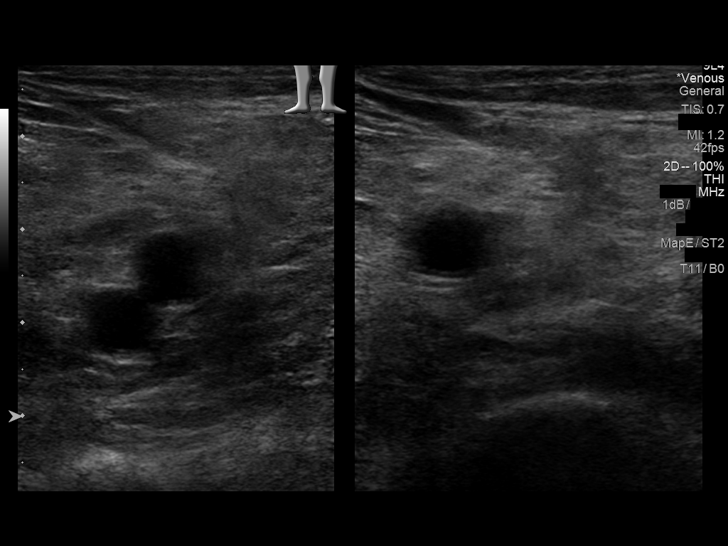
[im 12/33]
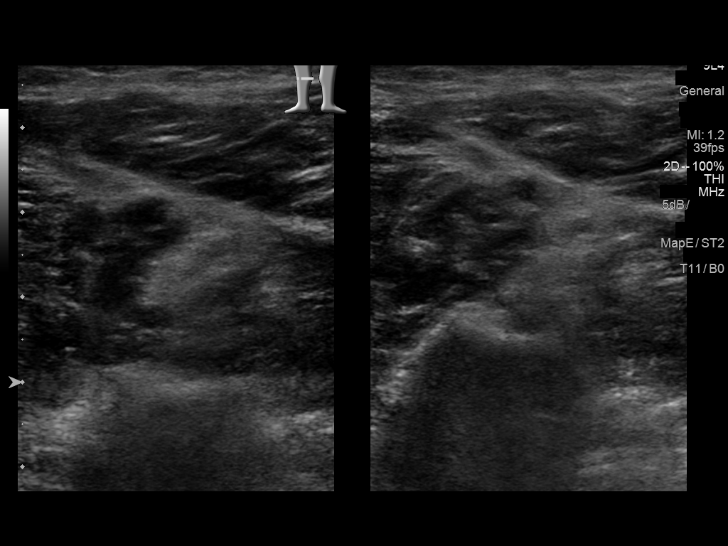
[im 14/33]
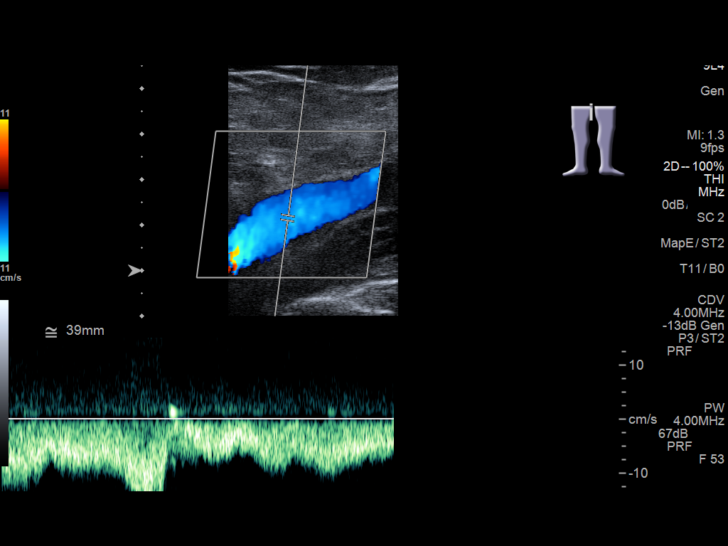
[im 17/33]
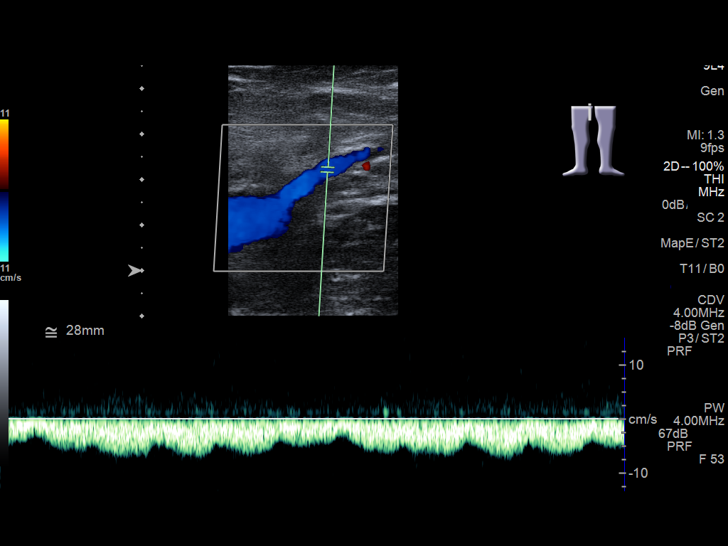
[im 19/33]
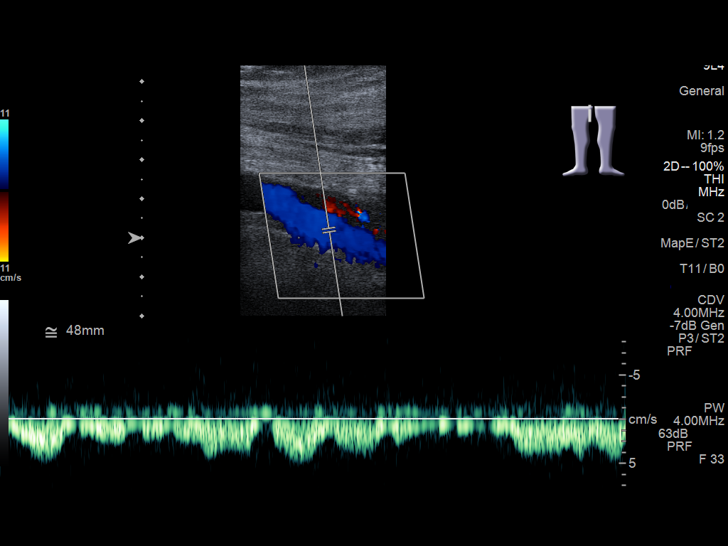
[im 21/33]
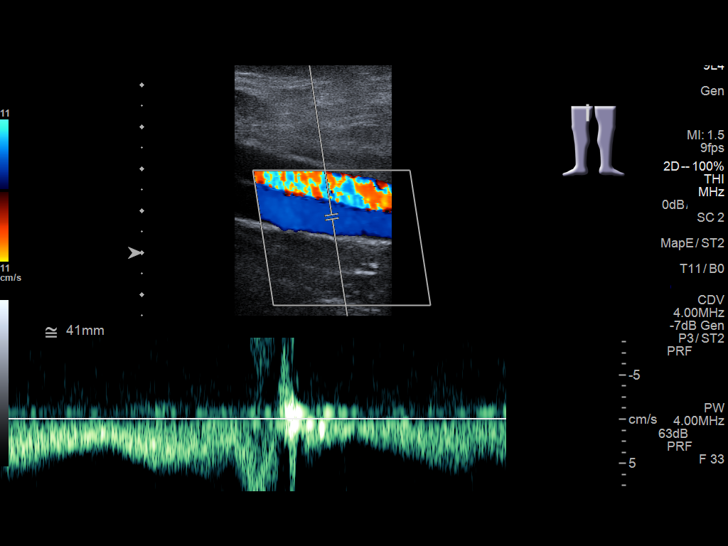
[im 24/33]
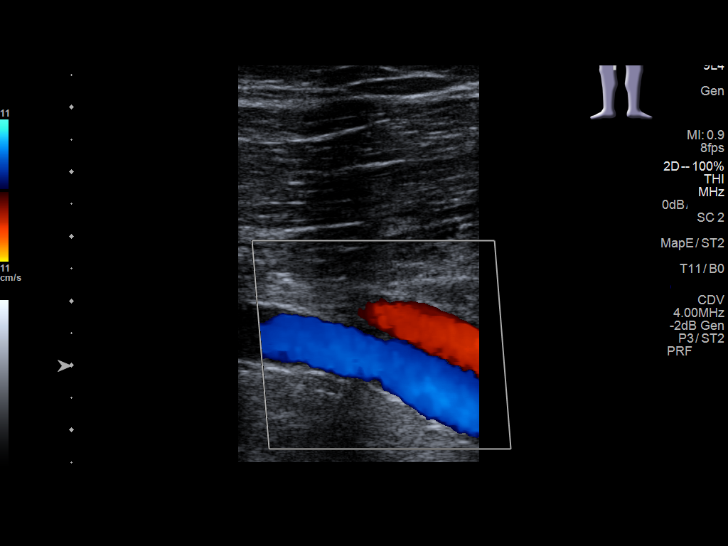
[im 27/33]
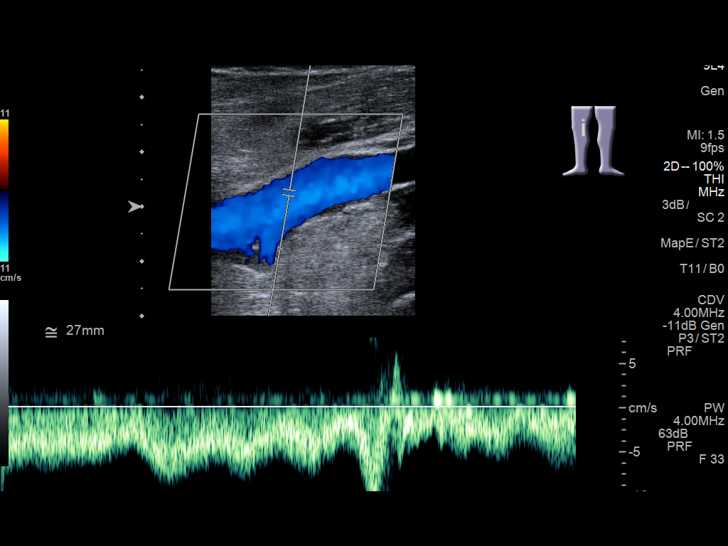
[im 30/33]
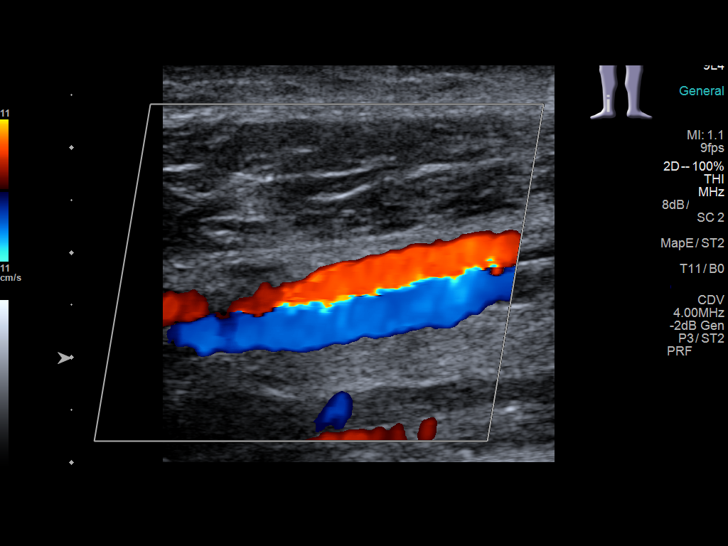
[im 33/33]
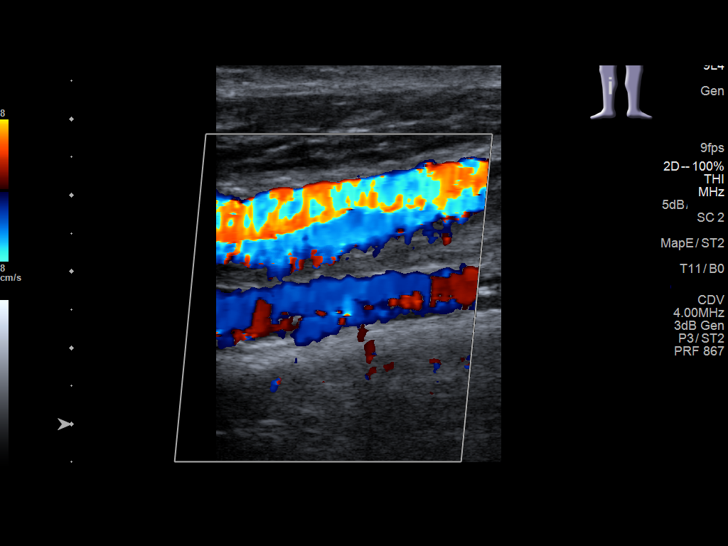

[13 of 24 positions shown; findings below may reference images not displayed]

FINDINGS: Contralateral Common Femoral Vein: Respiratory phasicity is normal
and symmetric with the symptomatic side. No evidence of thrombus.
Normal compressibility.

Common Femoral Vein: No evidence of thrombus. Normal
compressibility, respiratory phasicity and response to augmentation.

Saphenofemoral Junction: No evidence of thrombus. Normal
compressibility and flow on color Doppler imaging.

Profunda Femoral Vein: No evidence of thrombus. Normal
compressibility and flow on color Doppler imaging.

Femoral Vein: No evidence of thrombus. Normal compressibility,
respiratory phasicity and response to augmentation.

Popliteal Vein: No evidence of thrombus. Normal compressibility,
respiratory phasicity and response to augmentation.

Calf Veins: No evidence of thrombus. Normal compressibility and flow
on color Doppler imaging.

Superficial Great Saphenous Vein: No evidence of thrombus. Normal
compressibility and flow on color Doppler imaging.

Venous Reflux:  None.

Other Findings:  None.
IMPRESSION: No evidence of deep venous thrombosis.

## 2016-06-10 ENCOUNTER — Ambulatory Visit: Payer: Medicare Other | Admitting: Family Medicine

## 2016-06-24 NOTE — Progress Notes (Signed)
06/25/2016 9:20 AM   Ruben Garcia [birth year] [id number]  Referring provider: [name], MD [address] [place], [place] [address]  Chief Complaint  Patient presents with  . Benign Prostatic Hypertrophy    1 year follow up     HPI: Patient is a 71 year old Caucasian male with a history of BPH with LUTS who presents today for his yearly exam.  BPH WITH LUTS His IPSS score today is 6, which is mild lower urinary tract symptomatology. He is mostly satisfied with his quality life due to his urinary symptoms.  His main complaint today is a weak stream.  His previous I PSS score was 1/0.  He denies any dysuria, hematuria or suprapubic pain.  He also denies any recent fevers, chills, nausea or vomiting.  He does not have a family history of PCa.     IPSS    Row Name 06/25/16 0900         International Prostate Symptom Score   How often have you had the sensation of not emptying your bladder? Less than 1 in 5     How often have you had to urinate less than every two hours? Not at All     How often have you found you stopped and started again several times when you urinated? Less than 1 in 5 times     How often have you found it difficult to postpone urination? Not at All     How often have you had a weak urinary stream? Less than half the time     How often have you had to strain to start urination? Not at All     How many times did you typically get up at night to urinate? 2 Times     Total IPSS Score 6       Quality of Life due to urinary symptoms   If you were to spend the rest of your life with your urinary condition just the way it is now how would you feel about that? Mostly Satisfied        Score:  1-7 Mild 8-19 Moderate 20-35 Severe    PMH: Past Medical History:  Diagnosis Date  . Allergic rhinitis, cause unspecified   . Basal cell carcinoma of skin, site unspecified 04/28/2010   Nasal; [name].  [name] Kitchen BPH (benign prostatic hypertrophy)   . Essential  hypertension, benign   . Hemorrhoids, internal   . Incomplete bladder emptying   . Other abnormal glucose   . Over weight   . Personal history of colonic polyps   . Personal history of other genital system and obstetric disorders(V13.29)   . Pure hypercholesterolemia   . Unspecified disorder of kidney and ureter   . Unspecified disorder of liver     Surgical History: Past Surgical History:  Procedure Laterality Date  . BASAL CELL CARCINOMA EXCISION  2012   Facial        [place]  . CHOLECYSTECTOMY    . COLONOSCOPY  11/13/2010   single polyp; repeatin 5 years; [place]  . COLONOSCOPY WITH PROPOFOL N/A 12/13/2015   Procedure: COLONOSCOPY WITH PROPOFOL;  Surgeon: [name], MD;  Location: [facility] ENDOSCOPY;  Service: Endoscopy;  Laterality: N/A;  . ear surgery  05/2012   [name].  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  Benign.  [name] Kitchen GALLBLADDER SURGERY  1984  . HEMORRHOID SURGERY     [name]  . rotator cuff surgery Right    05/14/2016  . VASECTOMY  Home Medications:  Allergies as of 06/25/2016      Reactions   Codeine Nausea Only      Medication List       Accurate as of 06/25/16  9:20 AM. Always use your most recent med list.          aspirin EC 81 MG tablet Take 81 mg by mouth daily.   CALCIUM 600+D 600-200 MG-UNIT Tabs Generic drug:  Calcium Carbonate-Vitamin D Take 1 tablet by mouth daily.   FISH OIL CONCENTRATE 1000 MG Caps Take 1,000 mg by mouth daily.   gabapentin 400 MG capsule Commonly known as:  NEURONTIN Take 1 capsule (400 mg total) by mouth 2 (two) times daily.   Garlic 10 MG Caps Take 10 mg by mouth daily.   HYDROcodone-acetaminophen 7.5-325 MG tablet Commonly known as:  NORCO Take 1 tablet by mouth every 6 (six) hours as needed for moderate pain.   ibuprofen 200 MG tablet Commonly known as:  ADVIL,MOTRIN Take 400 mg by mouth every 8 (eight) hours as needed (for pain.).   ipratropium 0.03 % nasal spray Commonly known as:  ATROVENT Place 2 sprays into  the nose 2 (two) times daily.   lisinopril 20 MG tablet Commonly known as:  PRINIVIL,ZESTRIL TAKE ONE TABLET BY MOUTH ONCE DAILY   meloxicam 15 MG tablet Commonly known as:  MOBIC Take 1 tablet (15 mg total) by mouth daily.   methocarbamol 500 MG tablet Commonly known as:  ROBAXIN Take 1 tablet (500 mg total) by mouth 4 (four) times daily.   MULTIVITAMIN PO Take by mouth daily.   simvastatin 20 MG tablet Commonly known as:  ZOCOR TAKE ONE TABLET BY MOUTH IN THE EVENING   vitamin B-12 100 MCG tablet Commonly known as:  CYANOCOBALAMIN Take 100 mcg by mouth daily.   vitamin C 500 MG tablet Commonly known as:  ASCORBIC ACID Take 500 mg by mouth daily.   vitamin E 400 UNIT capsule Take 400 Units by mouth daily.       Allergies:  Allergies  Allergen Reactions  . Codeine Nausea Only    Family History: Family History  Problem Relation Age of Onset  . Heart disease Mother   . Diabetes Mother   . Hyperlipidemia Mother   . Arthritis Mother     rheumatoid  . Cancer Father     lung  . Diabetes Father   . Arthritis Sister   . Hyperlipidemia Sister   . Hyperlipidemia Sister   . Hypertension Sister   . Hyperlipidemia Sister   . Kidney disease Neg Hx   . Prostate cancer Neg Hx     Social History:  reports that he has never smoked. He has never used smokeless tobacco. He reports that he drinks alcohol. He reports that he does not use drugs.  ROS: UROLOGY Frequent Urination?: No Hard to postpone urination?: No Burning/pain with urination?: No Get up at night to urinate?: No Leakage of urine?: No Urine stream starts and stops?: No Trouble starting stream?: No Do you have to strain to urinate?: No Blood in urine?: No Urinary tract infection?: No Sexually transmitted disease?: No Injury to kidneys or bladder?: No Painful intercourse?: No Weak stream?: Yes Erection problems?: Yes Penile pain?: No  Gastrointestinal Nausea?: No Vomiting?:  No Indigestion/heartburn?: No Diarrhea?: No Constipation?: No  Constitutional Fever: No Night sweats?: No Weight loss?: No Fatigue?: No  Skin Skin rash/lesions?: No Itching?: No  Eyes Blurred vision?: No Double vision?: No  Ears/Nose/Throat Sore  throat?: No Sinus problems?: No  Hematologic/Lymphatic Swollen glands?: No Easy bruising?: No  Cardiovascular Leg swelling?: No Chest pain?: No  Respiratory Cough?: No Shortness of breath?: No  Endocrine Excessive thirst?: No  Musculoskeletal Back pain?: No Joint pain?: No  Neurological Headaches?: No Dizziness?: No  Psychologic Depression?: No Anxiety?: No  Physical Exam: BP (!) 144/85   Pulse 81   Ht 6' (1.829 m)   Wt 224 lb 8 oz (101.8 kg)   BMI 30.45 kg/m   Constitutional: Well nourished. Alert and oriented, No acute distress. HEENT: [place] AT, moist mucus membranes. Trachea midline, no masses. Cardiovascular: No clubbing, cyanosis, or edema. Respiratory: Normal respiratory effort, no increased work of breathing. GI: Abdomen is soft, non tender, non distended, no abdominal masses. Liver and spleen not palpable.  No hernias appreciated.  Stool sample for occult testing is not indicated.   GU: No CVA tenderness.  No bladder fullness or masses.  Patient with uncircumcised phallus. Foreskin easily retracted  Urethral meatus is patent.  No penile discharge. No penile lesions or rashes. Scrotum without lesions, cysts, rashes and/or edema.  Testicles are located scrotally bilaterally. No masses are appreciated in the testicles. Left and right epididymis are normal. Rectal: Patient with  normal sphincter tone. Anus and perineum without scarring or rashes. No rectal masses are appreciated. Prostate is approximately 55 grams, no nodules are appreciated. Seminal vesicles are normal. Skin: No rashes, bruises or suspicious lesions. Lymph: No cervical or inguinal adenopathy. Neurologic: Grossly intact, no focal deficits,  moving all 4 extremities. Psychiatric: Normal mood and affect.  Laboratory Data: Lab Results  Component Value Date   WBC 7.1 12/12/2015   HGB 15.7 06/08/2014   HCT 43.6 12/12/2015   MCV 92 12/12/2015   PLT 280 12/12/2015    Lab Results  Component Value Date   CREATININE 1.21 12/12/2015    Lab Results  Component Value Date   PSA 0.3 12/08/2013    Lab Results  Component Value Date   HGBA1C 5.4 12/14/2014       Component Value Date/Time   CHOL 179 12/12/2015 0839   HDL 48 12/12/2015 0839   CHOLHDL 3.7 12/12/2015 0839   LDLCALC 99 12/12/2015 0839    Lab Results  Component Value Date   AST 22 12/12/2015   Lab Results  Component Value Date   ALT 32 12/12/2015    Assessment & Plan:    1. BPH with LUTS  - IPSS score is 6/2, it is worsening  - Continue conservative management, avoiding bladder irritants and timed voiding's  - RTC in 12 months for IPSS, PSA and exam   Return in about 1 year (around 06/25/2017) for IPSS, PSA and exam.  These notes generated with voice recognition software. I apologize for typographical errors.  Zara Council, [facility] [address], [place], [place] [address] [phone]

## 2016-06-25 ENCOUNTER — Ambulatory Visit (INDEPENDENT_AMBULATORY_CARE_PROVIDER_SITE_OTHER): Payer: Federal, State, Local not specified - PPO | Admitting: Urology

## 2016-06-25 ENCOUNTER — Encounter: Payer: Self-pay | Admitting: Urology

## 2016-06-25 VITALS — BP 144/85 | HR 81 | Ht 72.0 in | Wt 224.5 lb

## 2016-06-25 DIAGNOSIS — N401 Enlarged prostate with lower urinary tract symptoms: Secondary | ICD-10-CM

## 2016-06-25 DIAGNOSIS — N138 Other obstructive and reflux uropathy: Secondary | ICD-10-CM | POA: Diagnosis not present

## 2016-06-26 ENCOUNTER — Telehealth: Payer: Self-pay

## 2016-06-26 DIAGNOSIS — N401 Enlarged prostate with lower urinary tract symptoms: Secondary | ICD-10-CM

## 2016-06-26 LAB — PSA: PROSTATE SPECIFIC AG, SERUM: 0.3 ng/mL (ref 0.0–4.0)

## 2016-06-26 NOTE — Telephone Encounter (Signed)
Spoke with pt in reference to PSA results. Pt voiced understanding.  

## 2016-06-26 NOTE — Telephone Encounter (Signed)
-----   Message from Nori Riis, PA-C sent at 06/26/2016  8:16 AM EST ----- Please notify the patient that his PSA is stable at 0.3.  We will see him next year.

## 2016-11-26 ENCOUNTER — Other Ambulatory Visit: Payer: Self-pay

## 2016-11-26 DIAGNOSIS — I10 Essential (primary) hypertension: Secondary | ICD-10-CM

## 2016-11-26 MED ORDER — LISINOPRIL 20 MG PO TABS: ORAL_TABLET | ORAL | 0 refills | Status: DC

## 2017-01-01 ENCOUNTER — Ambulatory Visit: Payer: Medicare Other | Admitting: Family Medicine

## 2017-02-03 ENCOUNTER — Ambulatory Visit (INDEPENDENT_AMBULATORY_CARE_PROVIDER_SITE_OTHER): Payer: Federal, State, Local not specified - PPO | Admitting: Family Medicine

## 2017-02-03 ENCOUNTER — Encounter: Payer: Self-pay | Admitting: Family Medicine

## 2017-02-03 VITALS — BP 124/84 | HR 72 | Temp 97.7°F | Resp 16 | Ht 72.0 in | Wt 226.0 lb

## 2017-02-03 DIAGNOSIS — R7303 Prediabetes: Secondary | ICD-10-CM | POA: Insufficient documentation

## 2017-02-03 DIAGNOSIS — N401 Enlarged prostate with lower urinary tract symptoms: Secondary | ICD-10-CM

## 2017-02-03 DIAGNOSIS — N138 Other obstructive and reflux uropathy: Secondary | ICD-10-CM

## 2017-02-03 DIAGNOSIS — E669 Obesity, unspecified: Secondary | ICD-10-CM | POA: Insufficient documentation

## 2017-02-03 DIAGNOSIS — E7800 Pure hypercholesterolemia, unspecified: Secondary | ICD-10-CM

## 2017-02-03 DIAGNOSIS — Z6830 Body mass index (BMI) 30.0-30.9, adult: Secondary | ICD-10-CM

## 2017-02-03 DIAGNOSIS — E663 Overweight: Secondary | ICD-10-CM | POA: Insufficient documentation

## 2017-02-03 DIAGNOSIS — Z85828 Personal history of other malignant neoplasm of skin: Secondary | ICD-10-CM | POA: Diagnosis not present

## 2017-02-03 DIAGNOSIS — I10 Essential (primary) hypertension: Secondary | ICD-10-CM | POA: Diagnosis not present

## 2017-02-03 DIAGNOSIS — Z0000 Encounter for general adult medical examination without abnormal findings: Secondary | ICD-10-CM | POA: Diagnosis not present

## 2017-02-03 MED ORDER — LISINOPRIL 20 MG PO TABS: 20.0000 mg | ORAL_TABLET | Freq: Every day | ORAL | 3 refills | Status: DC

## 2017-02-03 MED ORDER — SIMVASTATIN 20 MG PO TABS: 20.0000 mg | ORAL_TABLET | Freq: Every day | ORAL | 3 refills | Status: DC

## 2017-02-03 NOTE — Assessment & Plan Note (Signed)
Up-to-date on colon cancer screening, vaccinations Discussed shingles vaccination, and he will check with insurance regarding coverage Independent with ADLs No evidence of depression Low fall risk Discussed diet and exercise

## 2017-02-03 NOTE — Assessment & Plan Note (Signed)
Well-controlled Continue lisinopril at current dose Check CMP Follow-up in 6 months

## 2017-02-03 NOTE — Assessment & Plan Note (Signed)
Controlled on last check Refill simvastatin Recheck lipid panel Follow-up in 6 months

## 2017-02-03 NOTE — Assessment & Plan Note (Signed)
Followed by dermatology at the [facility] annual skin checks

## 2017-02-03 NOTE — Assessment & Plan Note (Signed)
Followed by urology States symptoms are well controlled today Not on any medications for this

## 2017-02-03 NOTE — Patient Instructions (Signed)
Ask insurance about Shingrix vaccine   Preventive Care 110 Years and Older, Male Preventive care refers to lifestyle choices and visits with your health care provider that can promote health and wellness. What does preventive care include?  A yearly physical exam. This is also called an annual well check.  Dental exams once or twice a year.  Routine eye exams. Ask your health care provider how often you should have your eyes checked.  Personal lifestyle choices, including: ? Daily care of your teeth and gums. ? Regular physical activity. ? Eating a healthy diet. ? Avoiding tobacco and drug use. ? Limiting alcohol use. ? Practicing safe sex. ? Taking low doses of aspirin every day. ? Taking vitamin and mineral supplements as recommended by your health care provider. What happens during an annual well check? The services and screenings done by your health care provider during your annual well check will depend on your age, overall health, lifestyle risk factors, and family history of disease. Counseling Your health care provider may ask you questions about your:  Alcohol use.  Tobacco use.  Drug use.  Emotional well-being.  Home and relationship well-being.  Sexual activity.  Eating habits.  History of falls.  Memory and ability to understand (cognition).  Work and work Statistician.  Screening You may have the following tests or measurements:  Height, weight, and BMI.  Blood pressure.  Lipid and cholesterol levels. These may be checked every 5 years, or more frequently if you are over 86 years old.  Skin check.  Lung cancer screening. You may have this screening every year starting at age 74 if you have a 30-pack-year history of smoking and currently smoke or have quit within the past 15 years.  Fecal occult blood test (FOBT) of the stool. You may have this test every year starting at age 42.  Flexible sigmoidoscopy or colonoscopy. You may have a  sigmoidoscopy every 5 years or a colonoscopy every 10 years starting at age 62.  Prostate cancer screening. Recommendations will vary depending on your family history and other risks.  Hepatitis C blood test.  Hepatitis B blood test.  Sexually transmitted disease (STD) testing.  Diabetes screening. This is done by checking your blood sugar (glucose) after you have not eaten for a while (fasting). You may have this done every 1-3 years.  Abdominal aortic aneurysm (AAA) screening. You may need this if you are a current or former smoker.  Osteoporosis. You may be screened starting at age 16 if you are at high risk.  Talk with your health care provider about your test results, treatment options, and if necessary, the need for more tests. Vaccines Your health care provider may recommend certain vaccines, such as:  Influenza vaccine. This is recommended every year.  Tetanus, diphtheria, and acellular pertussis (Tdap, Td) vaccine. You may need a Td booster every 10 years.  Varicella vaccine. You may need this if you have not been vaccinated.  Zoster vaccine. You may need this after age 50.  Measles, mumps, and rubella (MMR) vaccine. You may need at least one dose of MMR if you were born in 1957 or later. You may also need a second dose.  Pneumococcal 13-valent conjugate (PCV13) vaccine. One dose is recommended after age 16.  Pneumococcal polysaccharide (PPSV23) vaccine. One dose is recommended after age 66.  Meningococcal vaccine. You may need this if you have certain conditions.  Hepatitis A vaccine. You may need this if you have certain conditions or if you  travel or work in places where you may be exposed to hepatitis A.  Hepatitis B vaccine. You may need this if you have certain conditions or if you travel or work in places where you may be exposed to hepatitis B.  Haemophilus influenzae type b (Hib) vaccine. You may need this if you have certain risk factors.  Talk to your  health care provider about which screenings and vaccines you need and how often you need them. This information is not intended to replace advice given to you by your health care provider. Make sure you discuss any questions you have with your health care provider. Document Released: 05/11/2015 Document Revised: 01/02/2016 Document Reviewed: 02/13/2015 Elsevier Interactive Patient Education  2017 [employer].

## 2017-02-03 NOTE — Progress Notes (Signed)
7      Patient: Ruben Garcia, Male    DOB: 1945/05/13, 71 y.o.   MRN: [id number] Visit Date: 02/03/2017  Today's Provider: Lavon Paganini, MD   Chief Complaint  Patient presents with  . Annual Exam   Subjective:  Ruben Garcia presents to establish care. He was previously a pt of [facility], and he saw Dr. Tamala Julian. He followed Dr. Tamala Julian when she moved to [facility]. He wants to reestablish here due to distance. He has a H/O hypercholesterolemia, hypertension, elevated glucose, basal cell carcinoma. He is f/b dermatology at [facility] for this.  He is s/p excision of 3 lesions on face.   Annual physical exam Ruben Garcia is a 71 y.o. male who presents today for health maintenance and complete physical. He feels well. He reports exercising about 3-4 times per week. Walks for 45 minutes. He reports he is sleeping well.  Last CPE- 12/11/2015 Last colonoscopy- 12/13/2015- 2 hyperplastic polyps. Repeat 5 years per pt. ----------------------------------------------------------------- HLD: Taking simvastatin 20mg  daily.  No fam hx of early heart disease.  No personal history of heart disease.  Denies medication side effects.  Denies CP, SOB, DOE, LE edema.  BPH: Seeing urology.  Gets annual prostate check with PSA.  HTN: - Medications: lisinopril 20mg  daily - Compliance: good - Checking BP at home: no - Denies any SOB, CP, vision changes, LE edema, medication SEs, or symptoms of hypotension - Diet: low sodium  Prediabetes: Previously diagnosed after found to have elevated CBG while fasting.  Exercising regularly and trying to maintain healthy weight   Review of Systems  Constitutional: Negative.   HENT: Negative.   Eyes: Negative.   Respiratory: Negative.   Cardiovascular: Negative.   Gastrointestinal: Negative.   Endocrine: Negative.   Genitourinary: Negative.   Musculoskeletal: Negative.   Skin: Negative.   Allergic/Immunologic: Negative.   Neurological: Negative.    Hematological: Negative.   Psychiatric/Behavioral: Negative.     Social History      He  reports that he has never smoked. He has never used smokeless tobacco. He reports that he drinks about 3.0 oz of alcohol per week . He reports that he does not use drugs.       Social History   Social History  . Marital status: Married    Spouse name: Pamala Hurry  . Number of children: 2  . Years of education: 2 years college   Occupational History  . retired     Actor in 2005   Social History Main Topics  . Smoking status: Never Smoker  . Smokeless tobacco: Never Used  . Alcohol use 3.0 oz/week    5 Cans of beer per week     Comment: weekends beer, 4- 6 total Miller Light  . Drug use: No  . Sexual activity: Yes   Other Topics Concern  . None   Social History Narrative   Always uses seat belts. Smoke alarm and carbon monoxide detector in the home. Guns in the home stored in locked cabinet.Caffeine use: Coffee 2 servings per day, moderate amount. Exercise: Moderate walking 2 - 4 miles daily, 2 - 3 days per week.    Marital status:  Married x 47 years, happily.      Children:  2 children; 1 grandchild (76).      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      Tobacco: never      Alcohol:  Weekends; beer 4-6 per weekend.  Exercise:  Walking every other day 2.5 miles three times per day.      Seatbelt: 100%; no texting      Guns: secured guns.      Living Will: completed in 2014.  FULL CODE but no prolonged measures.        ADLs: independent with ADLs; no assistant devices    Past Medical History:  Diagnosis Date  . Allergic rhinitis, cause unspecified   . Basal cell carcinoma of skin, site unspecified 04/28/2010   Nasal; [name].  [name] Kitchen BPH (benign prostatic hypertrophy)   . Essential hypertension, benign   . Hemorrhoids, internal   . Incomplete bladder emptying   . Other abnormal glucose   . Over weight   . Personal history of colonic polyps   .  Personal history of other genital system and obstetric disorders(V13.29)   . Pure hypercholesterolemia   . Unspecified disorder of kidney and ureter   . Unspecified disorder of liver      Patient Active Problem List   Diagnosis Date Noted  . BPH with obstruction/lower urinary tract symptoms 06/26/2015  . History of basal cell carcinoma 12/11/2014  . Glucose intolerance (impaired glucose tolerance) 12/11/2014  . Annual physical exam 11/29/2012  . Essential hypertension, benign 11/29/2012  . Pure hypercholesterolemia 11/29/2012  . Other abnormal glucose 11/29/2012    Past Surgical History:  Procedure Laterality Date  . BASAL CELL CARCINOMA EXCISION  2012   Facial        [place]  . CHOLECYSTECTOMY    . COLONOSCOPY  11/13/2010   single polyp; repeatin 5 years; [place]  . COLONOSCOPY WITH PROPOFOL N/A 12/13/2015   Procedure: COLONOSCOPY WITH PROPOFOL;  Surgeon: [name], MD;  Location: [facility] ENDOSCOPY;  Service: Endoscopy;  Laterality: N/A;  . ear surgery  05/2012   [name].  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  Benign.  [name] Kitchen GALLBLADDER SURGERY  1984  . HEMORRHOID SURGERY     [name]  . rotator cuff surgery Right    05/14/2016  . VASECTOMY      Family History        Family Status  Relation Status  . Mother Deceased at age 57       heart disease  . Father Deceased at age 52       lung cancer  . Sister Alive  . Sister Alive  . Sister Alive  . Neg Hx (Not Specified)        His family history includes Arthritis in his mother and sister; Cancer in his father; Diabetes in his father and mother; Heart disease in his mother; Hyperlipidemia in his mother, sister, sister, and sister; Hypertension in his sister.     Allergies  Allergen Reactions  . Codeine Nausea Only     Current Outpatient Prescriptions:  .  aspirin EC 81 MG tablet, Take 81 mg by mouth daily., Disp: , Rfl:  .  Garlic 10 MG CAPS, Take 10 mg by mouth daily. , Disp: , Rfl:  .  ibuprofen (ADVIL,MOTRIN) 200 MG tablet,  Take 400 mg by mouth every 8 (eight) hours as needed (for pain.)., Disp: , Rfl:  .  lisinopril (PRINIVIL,ZESTRIL) 20 MG tablet, TAKE ONE TABLET BY MOUTH ONCE DAILY, Disp: 30 tablet, Rfl: 0 .  meloxicam (MOBIC) 15 MG tablet, Take 1 tablet (15 mg total) by mouth daily., Disp: 30 tablet, Rfl: 3 .  Multiple Vitamins-Minerals (MULTIVITAMIN PO), Take by mouth daily., Disp: , Rfl:  .  Omega-3 Fatty Acids (FISH OIL  CONCENTRATE) 1000 MG CAPS, Take 1,000 mg by mouth daily. , Disp: , Rfl:  .  simvastatin (ZOCOR) 20 MG tablet, TAKE ONE TABLET BY MOUTH IN THE EVENING (Patient taking differently: Take 20 mg by mouth at bedtime. TAKE ONE TABLET BY MOUTH IN THE EVENING), Disp: 90 tablet, Rfl: 3 .  vitamin E 400 UNIT capsule, Take 400 Units by mouth daily., Disp: , Rfl:  .  FLUZONE HIGH-DOSE 0.5 ML injection, TO BE ADMINISTERED BY PHARMACIST FOR IMMUNIZATION, Disp: , Rfl: 0   Patient Care Team: Virginia Crews, MD as PCP - General (Family Medicine)      Objective:   Vitals: BP 124/84 (BP Location: Left Arm, Patient Position: Sitting, Cuff Size: Large)   Pulse 72   Temp 97.7 F (36.5 C) (Oral)   Resp 16   Ht 6' (1.829 m)   Wt 226 lb (102.5 kg)   BMI 30.65 kg/m    Vitals:   02/03/17 0857  BP: 124/84  Pulse: 72  Resp: 16  Temp: 97.7 F (36.5 C)  TempSrc: Oral  Weight: 226 lb (102.5 kg)  Height: 6' (1.829 m)     Physical Exam  Constitutional: He is oriented to person, place, and time. He appears well-developed and well-nourished. No distress.  HENT:  Head: Normocephalic and atraumatic.  Right Ear: External ear normal.  Left Ear: External ear normal.  Nose: Nose normal.  Mouth/Throat: Oropharynx is clear and moist.  Eyes: Pupils are equal, round, and reactive to light. Conjunctivae are normal. No scleral icterus.  Neck: Neck supple. No thyromegaly present.  Cardiovascular: Normal rate, regular rhythm, normal heart sounds and intact distal pulses.   No murmur heard. Pulmonary/Chest:  Effort normal and breath sounds normal. No respiratory distress. He has no wheezes. He has no rales.  Abdominal: Soft. Bowel sounds are normal. He exhibits no distension. There is no tenderness. There is no rebound and no guarding.  Musculoskeletal: He exhibits no edema or deformity.  Lymphadenopathy:    He has no cervical adenopathy.  Neurological: He is alert and oriented to person, place, and time.  Skin: Skin is warm and dry. No rash noted.  Psychiatric: He has a normal mood and affect. His behavior is normal.  Vitals reviewed.    Depression Screen PHQ 2/9 Scores 02/03/2017 12/11/2015 06/05/2015 12/11/2014  PHQ - 2 Score 0 0 0 0      Assessment & Plan:     Routine Health Maintenance and Physical Exam  Exercise Activities and Dietary recommendations Goals    None      Immunization History  Administered Date(s) Administered  . Influenza, High Dose Seasonal PF 01/29/2017  . Influenza-Unspecified 01/28/2011, 02/02/2014, 01/28/2015  . Pneumococcal Conjugate-13 12/05/2013  . Pneumococcal Polysaccharide-23 06/05/2015  . Tdap 05/03/2008  . Zoster 03/12/2011    Health Maintenance  Topic Date Due  . INFLUENZA VACCINE  11/26/2016  . TETANUS/TDAP  05/03/2018  . COLONOSCOPY  12/12/2025  . Hepatitis C Screening  Completed  . PNA vac Low Risk Adult  Completed     Discussed health benefits of physical activity, and encouraged him to engage in regular exercise appropriate for his age and condition.    -------------------------------------------------------------------- Problem List Items Addressed This Visit      Cardiovascular and Mediastinum   Essential hypertension, benign    Well-controlled Continue lisinopril at current dose Check CMP Follow-up in 6 months      Relevant Medications   simvastatin (ZOCOR) 20 MG tablet   lisinopril (PRINIVIL,ZESTRIL)  20 MG tablet   Other Relevant Orders   COMPLETE METABOLIC PANEL WITH GFR     Musculoskeletal and Integument    History of basal cell carcinoma    Followed by dermatology at the [place] annual skin checks        Genitourinary   BPH with obstruction/lower urinary tract symptoms    Followed by urology States symptoms are well controlled today Not on any medications for this        Other   Annual physical exam - Primary    Up-to-date on colon cancer screening, vaccinations Discussed shingles vaccination, and he will check with insurance regarding coverage Independent with ADLs No evidence of depression Low fall risk Discussed diet and exercise      Relevant Orders   CBC w/Diff/Platelet   Pure hypercholesterolemia    Controlled on last check Refill simvastatin Recheck lipid panel Follow-up in 6 months      Relevant Medications   simvastatin (ZOCOR) 20 MG tablet   lisinopril (PRINIVIL,ZESTRIL) 20 MG tablet   Other Relevant Orders   Lipid panel   COMPLETE METABOLIC PANEL WITH GFR   Prediabetes    Recheck A1c Advised on low carb diet and exercise      Relevant Orders   Hemoglobin A1c   Obesity    Discussed diet and exercise         Return in about 6 months (around 08/04/2017) for HTN f/u.  The entirety of the information documented in the History of Present Illness, Review of Systems and Physical Exam were personally obtained by me. Portions of this information were initially documented by Raquel Sarna Ratchford, CMA and reviewed by me for thoroughness and accuracy.     Lavon Paganini, MD  [facility]

## 2017-02-03 NOTE — Assessment & Plan Note (Signed)
Recheck A1c Advised on low carb diet and exercise

## 2017-02-03 NOTE — Assessment & Plan Note (Signed)
Discussed diet and exercise 

## 2017-02-04 ENCOUNTER — Telehealth: Payer: Self-pay

## 2017-02-04 LAB — COMPLETE METABOLIC PANEL WITH GFR
AG Ratio: 1.6 (calc) (ref 1.0–2.5)
ALT: 31 U/L (ref 9–46)
AST: 25 U/L (ref 10–35)
Albumin: 4.2 g/dL (ref 3.6–5.1)
Alkaline phosphatase (APISO): 38 U/L — ABNORMAL LOW (ref 40–115)
BILIRUBIN TOTAL: 1.4 mg/dL — AB (ref 0.2–1.2)
BUN: 11 mg/dL (ref 7–25)
CALCIUM: 9.2 mg/dL (ref 8.6–10.3)
CHLORIDE: 103 mmol/L (ref 98–110)
CO2: 28 mmol/L (ref 20–32)
Creat: 1.11 mg/dL (ref 0.70–1.25)
GFR, EST AFRICAN AMERICAN: 78 mL/min/{1.73_m2} (ref >=60)
GFR, EST NON AFRICAN AMERICAN: 67 mL/min/{1.73_m2} (ref >=60)
GLUCOSE: 101 mg/dL — AB (ref 65–99)
Globulin: 2.7 g/dL (calc) (ref 1.9–3.7)
Potassium: 4.9 mmol/L (ref 3.5–5.3)
Sodium: 138 mmol/L (ref 135–146)
TOTAL PROTEIN: 6.9 g/dL (ref 6.1–8.1)

## 2017-02-04 LAB — CBC WITH DIFFERENTIAL/PLATELET
BASOS ABS: 62 {cells}/uL (ref 0–200)
BASOS PCT: 0.8 %
EOS PCT: 1.9 %
Eosinophils Absolute: 148 cells/uL (ref 15–500)
HCT: 45.2 % (ref 38.5–50.0)
Hemoglobin: 15.5 g/dL (ref 13.2–17.1)
LYMPHS ABS: 2434 {cells}/uL (ref 850–3900)
MCH: 31.8 pg (ref 27.0–33.0)
MCHC: 34.3 g/dL (ref 32.0–36.0)
MCV: 92.6 fL (ref 80.0–100.0)
MPV: 11.2 fL (ref 7.5–12.5)
Monocytes Relative: 7.4 %
NEUTROS ABS: 4579 {cells}/uL (ref 1500–7800)
Neutrophils Relative %: 58.7 %
PLATELETS: 295 10*3/uL (ref 140–400)
RBC: 4.88 10*6/uL (ref 4.20–5.80)
RDW: 12.3 % (ref 11.0–15.0)
Total Lymphocyte: 31.2 %
WBC mixed population: 577 cells/uL (ref 200–950)
WBC: 7.8 10*3/uL (ref 3.8–10.8)

## 2017-02-04 LAB — LIPID PANEL
Cholesterol: 168 mg/dL (ref <200)
HDL: 51 mg/dL (ref >40)
LDL Cholesterol (Calc): 90 mg/dL (calc)
NON-HDL CHOLESTEROL (CALC): 117 mg/dL (ref <130)
Total CHOL/HDL Ratio: 3.3 (calc) (ref <5.0)
Triglycerides: 170 mg/dL — ABNORMAL HIGH (ref <150)

## 2017-02-04 LAB — HEMOGLOBIN A1C
EAG (MMOL/L): 5.5 (calc)
Hgb A1c MFr Bld: 5.1 % of total Hgb (ref <5.7)
MEAN PLASMA GLUCOSE: 100 (calc)

## 2017-02-04 NOTE — Telephone Encounter (Signed)
-----   Message from Virginia Crews, MD sent at 02/04/2017  9:30 AM EDT ----- Cholesterol is good.  A1c is normal - no diabetes.  Normal kidney function, liver function, electrolytes, Blood counts.  Virginia Crews, MD, MPH [facility] 02/04/2017 9:30 AM

## 2017-02-04 NOTE — Telephone Encounter (Signed)
Left message advising pt. OK per DPR. 

## 2017-02-05 ENCOUNTER — Telehealth: Payer: Self-pay | Admitting: Family Medicine

## 2017-02-05 NOTE — Telephone Encounter (Signed)
Printed and pt advised.

## 2017-02-05 NOTE — Telephone Encounter (Signed)
Pt is requesting a copy of his lab results.  Pt will pick this up tomorrow if possible.  CB#[phone]/MW

## 2017-04-13 ENCOUNTER — Emergency Department
Admission: EM | Admit: 2017-04-13 | Discharge: 2017-04-13 | Disposition: A | Discharge status: Home / Self Care | Payer: Federal, State, Local not specified - PPO | Attending: Emergency Medicine | Admitting: Emergency Medicine

## 2017-04-13 ENCOUNTER — Encounter: Payer: Self-pay | Admitting: Emergency Medicine

## 2017-04-13 ENCOUNTER — Other Ambulatory Visit: Payer: Self-pay

## 2017-04-13 DIAGNOSIS — Z79899 Other long term (current) drug therapy: Secondary | ICD-10-CM | POA: Diagnosis not present

## 2017-04-13 DIAGNOSIS — R5381 Other malaise: Secondary | ICD-10-CM | POA: Diagnosis not present

## 2017-04-13 DIAGNOSIS — Z85828 Personal history of other malignant neoplasm of skin: Secondary | ICD-10-CM | POA: Insufficient documentation

## 2017-04-13 DIAGNOSIS — R509 Fever, unspecified: Secondary | ICD-10-CM | POA: Insufficient documentation

## 2017-04-13 DIAGNOSIS — Z7982 Long term (current) use of aspirin: Secondary | ICD-10-CM | POA: Insufficient documentation

## 2017-04-13 DIAGNOSIS — I10 Essential (primary) hypertension: Secondary | ICD-10-CM | POA: Diagnosis not present

## 2017-04-13 DIAGNOSIS — R6889 Other general symptoms and signs: Secondary | ICD-10-CM | POA: Diagnosis not present

## 2017-04-13 DIAGNOSIS — M791 Myalgia, unspecified site: Secondary | ICD-10-CM | POA: Diagnosis present

## 2017-04-13 LAB — INFLUENZA PANEL BY PCR (TYPE A & B)
Influenza A By PCR: NEGATIVE
Influenza B By PCR: NEGATIVE

## 2017-04-13 MED ORDER — ACETAMINOPHEN 325 MG PO TABS
650.0000 mg | ORAL_TABLET | Freq: Once | ORAL | Status: AC
  Administered 2017-04-13: 650 mg via ORAL
  Filled 2017-04-13: qty 2

## 2017-04-13 MED ORDER — OSELTAMIVIR PHOSPHATE 75 MG PO CAPS: 75.0000 mg | ORAL_CAPSULE | Freq: Two times a day (BID) | ORAL | 0 refills | Status: DC

## 2017-04-13 MED ORDER — IBUPROFEN 800 MG PO TABS
800.0000 mg | ORAL_TABLET | Freq: Once | ORAL | Status: AC
  Administered 2017-04-13: 800 mg via ORAL
  Filled 2017-04-13: qty 1

## 2017-04-13 NOTE — ED Provider Notes (Signed)
[facility] Emergency Department Provider Note  ____________________________________________  Time seen: Approximately 8:43 PM  I have reviewed the triage vital signs and the nursing notes.   HISTORY  Chief Complaint Generalized Body Aches    HPI Ruben Garcia is a 71 y.o. male who presents emergency department complaining of fever, chills, body aches, malaise.  Patient reports that symptoms began yesterday and have worsened today.  Patient states that he has been taking Motrin for aches as well as fever.  He is eating and drinking okay.  Patient denies any nasal congestion, sore throat, cough, abdominal pain, dysuria, polyuria, hematuria, diarrhea or constipation.  Patient did receive the flu shot this year.  Patient has not take any other medication besides Motrin.  He denies any headache, visual changes, neck pain or stiffness.  No other complaints.  Past Medical History:  Diagnosis Date  . Allergic rhinitis, cause unspecified   . Basal cell carcinoma of skin, site unspecified 04/28/2010   Nasal; [name].  [name] Kitchen BPH (benign prostatic hypertrophy)   . Essential hypertension, benign   . Hemorrhoids, internal   . Incomplete bladder emptying   . Other abnormal glucose   . Over weight   . Personal history of colonic polyps   . Personal history of other genital system and obstetric disorders(V13.29)   . Pure hypercholesterolemia   . Unspecified disorder of kidney and ureter   . Unspecified disorder of liver     Patient Active Problem List   Diagnosis Date Noted  . Prediabetes 02/03/2017  . Obesity 02/03/2017  . BPH with obstruction/lower urinary tract symptoms 06/26/2015  . History of basal cell carcinoma 12/11/2014  . Annual physical exam 11/29/2012  . Essential hypertension, benign 11/29/2012  . Pure hypercholesterolemia 11/29/2012    Past Surgical History:  Procedure Laterality Date  . BASAL CELL CARCINOMA EXCISION  2012   Facial        [place]   . COLONOSCOPY  11/13/2010   single polyp; repeatin 5 years; [place]  . COLONOSCOPY WITH PROPOFOL N/A 12/13/2015   Procedure: COLONOSCOPY WITH PROPOFOL;  Surgeon: [name], MD;  Location: [facility] ENDOSCOPY;  Service: Endoscopy;  Laterality: N/A;  . ear surgery  05/2012   [name].  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  Benign.  [name] Kitchen [place]  . LAPAROSCOPIC CHOLECYSTECTOMY  1982  . rotator cuff surgery Right 05/14/2016  . VASECTOMY  1978    Prior to Admission medications   Medication Sig Start Date End Date Taking? Authorizing Provider  aspirin EC 81 MG tablet Take 81 mg by mouth daily.    [provider]  FLUZONE HIGH-DOSE 0.5 ML injection TO BE ADMINISTERED BY PHARMACIST FOR IMMUNIZATION 01/29/17   [provider]  Garlic 10 MG CAPS Take 10 mg by mouth daily.     [provider]  ibuprofen (ADVIL,MOTRIN) 200 MG tablet Take 400 mg by mouth every 8 (eight) hours as needed (for pain.).    [provider]  lisinopril (PRINIVIL,ZESTRIL) 20 MG tablet Take 1 tablet (20 mg total) by mouth daily. TAKE ONE TABLET BY MOUTH ONCE DAILY 02/03/17   Virginia Crews, MD  Multiple Vitamins-Minerals (MULTIVITAMIN PO) Take by mouth daily.    [provider]  Omega-3 Fatty Acids (FISH OIL CONCENTRATE) 1000 MG CAPS Take 1,000 mg by mouth daily.     [provider]  oseltamivir (TAMIFLU) 75 MG capsule Take 1 capsule (75 mg total) by mouth 2 (two) times daily.  04/13/17   Malaney Mcbean, Charline Bills, PA-C  simvastatin (ZOCOR) 20 MG tablet Take 1 tablet (20 mg total) by mouth at bedtime. 02/03/17   Virginia Crews, MD  vitamin E 400 UNIT capsule Take 400 Units by mouth daily.    [provider]    Allergies Codeine  Family History  Problem Relation Age of Onset  . Heart disease Mother        first MI in 49s  . Diabetes Mother   . Hyperlipidemia Mother   . Arthritis Mother        rheumatoid  . Cancer Father        lung  . Diabetes  Father   . Arthritis Sister   . Hyperlipidemia Sister   . Hyperlipidemia Sister   . Hypertension Sister   . Hyperlipidemia Sister   . Kidney disease Neg Hx   . Prostate cancer Neg Hx   . Colon cancer Neg Hx     Social History Social History   Tobacco Use  . Smoking status: Never Smoker  . Smokeless tobacco: Never Used  Substance Use Topics  . Alcohol use: Yes    Alcohol/week: 3.0 oz    Types: 5 Cans of beer per week    Comment: weekends beer, 4- 6 total Miller Light  . Drug use: No     Review of Systems  Constitutional: Positive fever/chills. Positive for malaise Eyes: No visual changes. No discharge ENT: No upper respiratory complaints. Cardiovascular: no chest pain. Respiratory: no cough. No SOB. Gastrointestinal: No abdominal pain.  No nausea, no vomiting.  No diarrhea.  No constipation. Genitourinary: Negative for dysuria. No hematuria Musculoskeletal: Negative for musculoskeletal pain.  Positive for generalized arthralgias and myalgias Skin: Negative for rash, abrasions, lacerations, ecchymosis. Neurological: Negative for headaches, focal weakness or numbness. 10-point ROS otherwise negative.  ____________________________________________   PHYSICAL EXAM:  VITAL SIGNS: ED Triage Vitals  Enc Vitals Group     BP 04/13/17 1942 109/64     Pulse Rate 04/13/17 1942 (!) 110     Resp 04/13/17 1942 20     Temp 04/13/17 1942 98.7 F (37.1 C)     Temp Source 04/13/17 1942 Oral     SpO2 04/13/17 1942 96 %     Weight 04/13/17 1943 220 lb (99.8 kg)     Height 04/13/17 1943 6' (1.829 m)     Head Circumference --      Peak Flow --      Pain Score 04/13/17 1942 7     Pain Loc --      Pain Edu? --      Excl. in [place]? --      Constitutional: Alert and oriented. Well appearing and in no acute distress. Eyes: Conjunctivae are normal. PERRL. EOMI. Head: Atraumatic. ENT:      Ears: EACs and TMs unremarkable bilaterally.      Nose: No congestion/rhinnorhea.       Mouth/Throat: Mucous membranes are moist.  Neck: No stridor.  Neck is supple full range of motion Hematological/Lymphatic/Immunilogical: Scattered nontender anterior cervical lymphadenopathy. Cardiovascular: Normal rate, regular rhythm. Normal S1 and S2.  Good peripheral circulation. Respiratory: Normal respiratory effort without tachypnea or retractions. Lungs CTAB. Good air entry to the bases with no decreased or absent breath sounds. Gastrointestinal: Bowel sounds 4 quadrants. Soft and nontender to palpation. No guarding or rigidity. No palpable masses. No distention. No CVA tenderness. Musculoskeletal: Full range of motion to all extremities. No gross deformities appreciated. Neurologic:  Normal speech and language. No gross focal neurologic deficits are appreciated.  Skin:  Skin is warm, dry and intact. No rash noted. Psychiatric: Mood and affect are normal. Speech and behavior are normal. Patient exhibits appropriate insight and judgement.   ____________________________________________   LABS (all labs ordered are listed, but only abnormal results are displayed)  Labs Reviewed  INFLUENZA PANEL BY PCR (TYPE A & B)   ____________________________________________  EKG   ____________________________________________  RADIOLOGY   No results found.  ____________________________________________    PROCEDURES  Procedure(s) performed:    Procedures    Medications  acetaminophen (TYLENOL) tablet 650 mg (not administered)  ibuprofen (ADVIL,MOTRIN) tablet 800 mg (not administered)     ____________________________________________   INITIAL IMPRESSION / ASSESSMENT AND PLAN / ED COURSE  Pertinent labs & imaging results that were available during my care of the patient were reviewed by me and considered in my medical decision making (see chart for details).  Review of the [place] CSRS was performed in accordance of the [place] prior to dispensing any controlled drugs.      Patient's diagnosis is consistent with influenza-like illness.  Patient presents with 24-hour history of body aches, fever, malaise.  Exam was reassuring with no acute findings.  She will differential includes viral URI, influenza, pneumonia, UTI, electrolyte imbalance.  Offered the patient labs, influenza testing.  Patient requests influenza testing but declines any other lab work.  Patient requests Tamiflu and states that he will take Tylenol Motrin at home.  Patient had no other complaints.  At this time, patient will be prescribed Tamiflu, influenza testing is performed.  Patient is advised to follow-up should he have any other symptoms, or worsening of current symptoms.  Patient verbalizes understanding of same.. Patient will be discharged home with prescriptions for Tamiflu. Patient is to follow up with [facility] care as needed or otherwise directed. Patient is given ED precautions to return to the ED for any worsening or new symptoms.     ____________________________________________  FINAL CLINICAL IMPRESSION(S) / ED DIAGNOSES  Final diagnoses:  Flu-like symptoms      NEW MEDICATIONS STARTED DURING THIS VISIT:  ED Discharge Orders        Ordered    oseltamivir (TAMIFLU) 75 MG capsule  2 times daily     04/13/17 2044          This chart was dictated using voice recognition software/Dragon. Despite best efforts to proofread, errors can occur which can change the meaning. Any change was purely unintentional.    Darletta Moll, PA-C 04/13/17 2049    Delman Kitten, MD 04/14/17 [phone]

## 2017-04-13 NOTE — ED Triage Notes (Signed)
Chills and body aches since yesterday, denies cough or sore throat.

## 2017-04-15 ENCOUNTER — Ambulatory Visit (INDEPENDENT_AMBULATORY_CARE_PROVIDER_SITE_OTHER): Payer: Federal, State, Local not specified - PPO | Admitting: Family Medicine

## 2017-04-15 ENCOUNTER — Encounter: Payer: Self-pay | Admitting: Family Medicine

## 2017-04-15 VITALS — BP 126/76 | HR 92 | Temp 97.6°F | Resp 14 | Wt 224.0 lb

## 2017-04-15 DIAGNOSIS — L03115 Cellulitis of right lower limb: Secondary | ICD-10-CM | POA: Diagnosis not present

## 2017-04-15 MED ORDER — SULFAMETHOXAZOLE-TRIMETHOPRIM 800-160 MG PO TABS: 1.0000 | ORAL_TABLET | Freq: Two times a day (BID) | ORAL | 0 refills | Status: AC

## 2017-04-15 NOTE — Progress Notes (Signed)
Patient: Ruben Garcia Male    DOB: 02-28-1946   71 y.o.   MRN: [id number] Visit Date: 04/15/2017  Today's Provider: Lavon Paganini, MD   Chief Complaint  Patient presents with  . Leg Pain   Subjective:    Leg Pain   Incident onset: Sunday. There was no injury mechanism. The pain is present in the left leg and left foot. Quality: soreness. The pain has been worsening since onset. Associated symptoms comments: Left leg redness, soreness, swelling, warm to the touch. Patient reports chills, fever, and body aches on Monday. He went to the ER and was diagnosed with flu-like symptoms and prescribed Tamiflu. . The symptoms are aggravated by weight bearing. He has tried acetaminophen and NSAIDs for the symptoms. The treatment provided no relief.  Redness appeared on R foot with pain in foot and radiating to thigh on Tuesday morning.  He did not take Tamiflu, because he thought this was cellulitis and not the flu.  Patient reports PMH of cellulitis in the left leg 8-10 years ago.   Allergies  Allergen Reactions  . Codeine Nausea Only     Current Outpatient Medications:  .  aspirin EC 81 MG tablet, Take 81 mg by mouth daily., Disp: , Rfl:  .  Garlic 10 MG CAPS, Take 10 mg by mouth daily. , Disp: , Rfl:  .  ibuprofen (ADVIL,MOTRIN) 200 MG tablet, Take 400 mg by mouth every 8 (eight) hours as needed (for pain.)., Disp: , Rfl:  .  lisinopril (PRINIVIL,ZESTRIL) 20 MG tablet, Take 1 tablet (20 mg total) by mouth daily. TAKE ONE TABLET BY MOUTH ONCE DAILY, Disp: 90 tablet, Rfl: 3 .  Multiple Vitamins-Minerals (MULTIVITAMIN PO), Take by mouth daily., Disp: , Rfl:  .  Omega-3 Fatty Acids (FISH OIL CONCENTRATE) 1000 MG CAPS, Take 1,000 mg by mouth daily. , Disp: , Rfl:  .  simvastatin (ZOCOR) 20 MG tablet, Take 1 tablet (20 mg total) by mouth at bedtime., Disp: 90 tablet, Rfl: 3 .  vitamin E 400 UNIT capsule, Take 400 Units by mouth daily., Disp: , Rfl:  .  oseltamivir (TAMIFLU) 75 MG  capsule, Take 1 capsule (75 mg total) by mouth 2 (two) times daily. (Patient not taking: Reported on 04/15/2017), Disp: 10 capsule, Rfl: 0  Review of Systems  Constitutional: Negative.   Respiratory: Negative.   Cardiovascular: Negative.   Musculoskeletal:       Left leg pain     Social History   Tobacco Use  . Smoking status: Never Smoker  . Smokeless tobacco: Never Used  Substance Use Topics  . Alcohol use: Yes    Alcohol/week: 3.0 oz    Types: 5 Cans of beer per week    Comment: weekends beer, 4- 6 total [employer]   Objective:   BP 126/76 (BP Location: Left Arm, Patient Position: Sitting, Cuff Size: Normal)   Pulse 92   Temp 97.6 F (36.4 C) (Oral)   Resp 14   Wt 224 lb (101.6 kg)   SpO2 96%   BMI 30.38 kg/m    Physical Exam  Constitutional: He is oriented to person, place, and time. He appears well-developed and well-nourished. No distress.  HENT:  Head: Normocephalic and atraumatic.  Eyes: Conjunctivae are normal. No scleral icterus.  Cardiovascular: Normal rate, regular rhythm, normal heart sounds and intact distal pulses.  No murmur heard. Pulmonary/Chest: Effort normal and breath sounds normal. No respiratory distress. He has no wheezes. He has  no rales.  Musculoskeletal: He exhibits no edema or deformity.  Neurological: He is alert and oriented to person, place, and time.  Skin: Skin is warm and dry.  Redness of dorsum of R foot (see picture below) that is TTP  Psychiatric: He has a normal mood and affect. His behavior is normal.  Vitals reviewed.         Assessment & Plan:     1. Cellulitis of right lower extremity - exam and symptoms c/w cellulitis without systemic illness - treat with 7 d course of Bactrim  - return precautions discussed in detail  Meds ordered this encounter  Medications  . sulfamethoxazole-trimethoprim (BACTRIM DS,SEPTRA DS) 800-160 MG tablet    Sig: Take 1 tablet by mouth 2 (two) times daily for 7 days.    Dispense:  14  tablet    Refill:  0    Return if symptoms worsen or fail to improve.      The entirety of the information documented in the History of Present Illness, Review of Systems and Physical Exam were personally obtained by me. Portions of this information were initially documented by Kinnie Scales, CMA and reviewed by me for thoroughness and accuracy.    Virginia Crews, MD, MPH [facility] 04/15/2017 4:37 PM

## 2017-04-15 NOTE — Patient Instructions (Signed)

## 2017-06-23 ENCOUNTER — Other Ambulatory Visit: Payer: Medicare Other

## 2017-06-25 ENCOUNTER — Ambulatory Visit: Payer: Medicare Other | Admitting: Urology

## 2017-08-04 ENCOUNTER — Encounter: Payer: Self-pay | Admitting: Family Medicine

## 2017-08-04 ENCOUNTER — Ambulatory Visit: Payer: Federal, State, Local not specified - PPO | Admitting: Family Medicine

## 2017-08-04 VITALS — BP 130/82 | HR 73 | Temp 97.4°F | Resp 16 | Wt 221.0 lb

## 2017-08-04 DIAGNOSIS — N401 Enlarged prostate with lower urinary tract symptoms: Secondary | ICD-10-CM | POA: Diagnosis not present

## 2017-08-04 DIAGNOSIS — E7800 Pure hypercholesterolemia, unspecified: Secondary | ICD-10-CM

## 2017-08-04 DIAGNOSIS — R7303 Prediabetes: Secondary | ICD-10-CM | POA: Diagnosis not present

## 2017-08-04 DIAGNOSIS — E669 Obesity, unspecified: Secondary | ICD-10-CM | POA: Diagnosis not present

## 2017-08-04 DIAGNOSIS — I10 Essential (primary) hypertension: Secondary | ICD-10-CM | POA: Diagnosis not present

## 2017-08-04 DIAGNOSIS — Z6830 Body mass index (BMI) 30.0-30.9, adult: Secondary | ICD-10-CM | POA: Diagnosis not present

## 2017-08-04 NOTE — Assessment & Plan Note (Signed)
previously well controlled Advised on diet and exercise Recheck A1c F/u in 6 months

## 2017-08-04 NOTE — Assessment & Plan Note (Signed)
Discussed diet and exercise 

## 2017-08-04 NOTE — Assessment & Plan Note (Signed)
Well controlled Continue lisinopril at current dose Check BMP F/u in 6 months

## 2017-08-04 NOTE — Progress Notes (Signed)
Patient: Ruben Garcia Male    DOB: 07-26-1945   72 y.o.   MRN: [id number] Visit Date: 08/04/2017  Today's Provider: Lavon Paganini, MD   Chief Complaint  Patient presents with  . Hypertension  . Hyperlipidemia   Subjective:    HPI  Hypertension, follow-up:  BP Readings from Last 3 Encounters:  08/04/17 130/82  04/15/17 126/76  04/13/17 109/64    He was last seen for hypertension 6 months ago.  BP at that visit was 124/84. Management since that visit includes continuing lisinopril. He reports good compliance with treatment. He is not having side effects.  He is exercising. Walks every day. He is adherent to low salt diet.   Outside blood pressures are 120's/80's. He is experiencing none.  Patient denies chest pain, chest pressure/discomfort, claudication, dyspnea, exertional chest pressure/discomfort, fatigue, irregular heart beat, lower extremity edema, near-syncope, orthopnea, palpitations and syncope.   Cardiovascular risk factors include advanced age (older than 66 for men, 6 for women), dyslipidemia, hypertension and male gender.    Weight trend: fluctuating a bit Wt Readings from Last 3 Encounters:  08/04/17 221 lb (100.2 kg)  04/15/17 224 lb (101.6 kg)  04/13/17 220 lb (99.8 kg)    Current diet: in general, a "healthy" diet    ------------------------------------------------------------------------  Lipid/Cholesterol, Follow-up:   Last seen for this 6 months ago.  Management changes since that visit include checking labs. . Last Lipid Panel:    Component Value Date/Time   CHOL 168 02/03/2017 0952   CHOL 179 12/12/2015 0839   TRIG 170 (H) 02/03/2017 0952   HDL 51 02/03/2017 0952   HDL 48 12/12/2015 0839   CHOLHDL 3.3 02/03/2017 0952   LDLCALC 90 02/03/2017 0952    Risk factors for vascular disease include hypercholesterolemia and hypertension  He reports good compliance with treatment. He is not having side effects.   He is working  on weight loss and happy to have lost 3 lbs since last visit  Has upcoming urology appt for annual f/u.  Would like to have PSA checked today -------------------------------------------------------------------   Allergies  Allergen Reactions  . Codeine Nausea Only     Current Outpatient Medications:  .  aspirin EC 81 MG tablet, Take 81 mg by mouth daily., Disp: , Rfl:  .  Garlic 10 MG CAPS, Take 10 mg by mouth daily. , Disp: , Rfl:  .  ibuprofen (ADVIL,MOTRIN) 200 MG tablet, Take 400 mg by mouth every 8 (eight) hours as needed (for pain.)., Disp: , Rfl:  .  lisinopril (PRINIVIL,ZESTRIL) 20 MG tablet, Take 1 tablet (20 mg total) by mouth daily. TAKE ONE TABLET BY MOUTH ONCE DAILY, Disp: 90 tablet, Rfl: 3 .  Multiple Vitamins-Minerals (MULTIVITAMIN PO), Take by mouth daily., Disp: , Rfl:  .  Omega-3 Fatty Acids (FISH OIL CONCENTRATE) 1000 MG CAPS, Take 1,000 mg by mouth daily. , Disp: , Rfl:  .  simvastatin (ZOCOR) 20 MG tablet, Take 1 tablet (20 mg total) by mouth at bedtime., Disp: 90 tablet, Rfl: 3 .  vitamin E 400 UNIT capsule, Take 400 Units by mouth daily., Disp: , Rfl:   Review of Systems  Constitutional: Negative for activity change, appetite change, chills, diaphoresis, fatigue, fever and unexpected weight change.  Respiratory: Negative.  Negative for shortness of breath.   Cardiovascular: Negative for chest pain, palpitations and leg swelling.  Gastrointestinal: Negative.   Genitourinary: Negative.   Musculoskeletal: Negative.   Skin: Negative.   Neurological: Negative.  Psychiatric/Behavioral: Negative.     Social History   Tobacco Use  . Smoking status: Never Smoker  . Smokeless tobacco: Never Used  Substance Use Topics  . Alcohol use: Yes    Alcohol/week: 3.0 oz    Types: 5 Cans of beer per week    Comment: weekends beer, 4- 6 total [employer]   Objective:   BP 130/82 (BP Location: Left Arm, Patient Position: Sitting, Cuff Size: Large)   Pulse 73   Temp  (!) 97.4 F (36.3 C) (Oral)   Resp 16   Wt 221 lb (100.2 kg)   SpO2 96%   BMI 29.97 kg/m  Vitals:   08/04/17 0840  BP: 130/82  Pulse: 73  Resp: 16  Temp: (!) 97.4 F (36.3 C)  TempSrc: Oral  SpO2: 96%  Weight: 221 lb (100.2 kg)     Physical Exam  Constitutional: He is oriented to person, place, and time. He appears well-developed and well-nourished. No distress.  HENT:  Head: Normocephalic and atraumatic.  Eyes: Conjunctivae are normal. No scleral icterus.  Cardiovascular: Normal rate, regular rhythm, normal heart sounds and intact distal pulses.  No murmur heard. Pulmonary/Chest: Effort normal and breath sounds normal. No respiratory distress. He has no wheezes.  Musculoskeletal: He exhibits no edema.  Neurological: He is alert and oriented to person, place, and time.  Skin: Skin is warm and dry. Capillary refill takes less than 2 seconds. No rash noted.  Psychiatric: He has a normal mood and affect. His behavior is normal.  Vitals reviewed.     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   The entirety of the information documented in the  History of Present Illness, Review of Systems and Physical Exam were personally obtained by me. Portions of this information were initially documented by Raquel Sarna Ratchford, CMA and reviewed by me for thoroughness and accuracy.    Virginia Crews, MD, MPH [facility] 08/04/2017 9:11 AM

## 2017-08-04 NOTE — Assessment & Plan Note (Signed)
Well controlled on last check Continue simvastatin  F/u in 6 months - FLP at that time

## 2017-08-04 NOTE — Assessment & Plan Note (Signed)
Followed by urology Not currently on any medications Check PSA

## 2017-08-05 LAB — BASIC METABOLIC PANEL
BUN / CREAT RATIO: 12 (ref 10–24)
BUN: 14 mg/dL (ref 8–27)
CO2: 22 mmol/L (ref 20–29)
CREATININE: 1.17 mg/dL (ref 0.76–1.27)
Calcium: 9.5 mg/dL (ref 8.6–10.2)
Chloride: 104 mmol/L (ref 96–106)
GFR calc Af Amer: 73 mL/min/{1.73_m2} (ref >59)
GFR, EST NON AFRICAN AMERICAN: 63 mL/min/{1.73_m2} (ref >59)
GLUCOSE: 95 mg/dL (ref 65–99)
Potassium: 4.7 mmol/L (ref 3.5–5.2)
Sodium: 141 mmol/L (ref 134–144)

## 2017-08-05 LAB — PSA TOTAL (REFLEX TO FREE): Prostate Specific Ag, Serum: 0.4 ng/mL (ref 0.0–4.0)

## 2017-08-05 LAB — HEMOGLOBIN A1C
Est. average glucose Bld gHb Est-mCnc: 103 mg/dL
HEMOGLOBIN A1C: 5.2 % (ref 4.8–5.6)

## 2017-08-06 ENCOUNTER — Telehealth: Payer: Self-pay

## 2017-08-06 NOTE — Telephone Encounter (Signed)
Patient was notified of results. Expressed understanding.  

## 2017-08-06 NOTE — Telephone Encounter (Signed)
-----   Message from Virginia Crews, MD sent at 08/05/2017  8:23 AM EDT ----- Normal electrolytes, A1c, kidney function, PSA  Brita Romp Dionne Bucy, MD, MPH [facility] 08/05/2017 8:23 AM

## 2017-08-06 NOTE — Telephone Encounter (Signed)
LMTCB  Thanks,  -Aeneas Longsworth 

## 2017-08-13 ENCOUNTER — Ambulatory Visit: Payer: Federal, State, Local not specified - PPO | Admitting: Urology

## 2017-09-07 NOTE — Progress Notes (Signed)
09/09/2017 10:06 AM   Ruben Garcia 07-25-1945 [id number]  Referring provider: Virginia Crews, [address] [place], [place] [address]  Chief Complaint  Patient presents with  . Benign Prostatic Hypertrophy    HPI: Patient is a 72 year old Caucasian male with a history of BPH with LUTS who presents today for his yearly exam.  BPH WITH LUTS His IPSS score today is 3, which is mild lower urinary tract symptomatology. He is delighted with his quality life due to his urinary symptoms.  His main complaint today is a weak stream.  His previous I PSS score was 6/2.  He denies any dysuria, hematuria or suprapubic pain.  He also denies any recent fevers, chills, nausea or vomiting.  He does not have a family history of PCa. IPSS    Row Name 09/09/17 0900         International Prostate Symptom Score   How often have you had the sensation of not emptying your bladder?  Not at All     How often have you had to urinate less than every two hours?  Not at All     How often have you found you stopped and started again several times when you urinated?  Less than 1 in 5 times     How often have you found it difficult to postpone urination?  Not at All     How often have you had a weak urinary stream?  Less than 1 in 5 times     How often have you had to strain to start urination?  Not at All     How many times did you typically get up at night to urinate?  1 Time     Total IPSS Score  3       Quality of Life due to urinary symptoms   If you were to spend the rest of your life with your urinary condition just the way it is now how would you feel about that?  Delighted        Score:  1-7 Mild 8-19 Moderate 20-35 Severe    PMH: Past Medical History:  Diagnosis Date  . Allergic rhinitis, cause unspecified   . Basal cell carcinoma of skin, site unspecified 04/28/2010   Nasal; [name].  [name] Kitchen BPH (benign prostatic hypertrophy)   . Essential hypertension, benign   .  Hemorrhoids, internal   . Incomplete bladder emptying   . Other abnormal glucose   . Over weight   . Personal history of colonic polyps   . Personal history of other genital system and obstetric disorders(V13.29)   . Pure hypercholesterolemia   . Unspecified disorder of kidney and ureter   . Unspecified disorder of liver     Surgical History: Past Surgical History:  Procedure Laterality Date  . BASAL CELL CARCINOMA EXCISION  2012   Facial        [place]  . COLONOSCOPY  11/13/2010   single polyp; repeatin 5 years; [facility]  . COLONOSCOPY WITH PROPOFOL N/A 12/13/2015   Procedure: COLONOSCOPY WITH PROPOFOL;  Surgeon: [name], MD;  Location: [facility] ENDOSCOPY;  Service: Endoscopy;  Laterality: N/A;  . ear surgery  05/2012   [name].  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  Benign.  [name] Kitchen [place]  . LAPAROSCOPIC CHOLECYSTECTOMY  1982  . rotator cuff surgery Right 05/14/2016  . VASECTOMY  1978    Home Medications:  Allergies as of 09/09/2017  Reactions   Codeine Nausea Only      Medication List        Accurate as of 09/09/17 10:06 AM. Always use your most recent med list.          aspirin EC 81 MG tablet Take 81 mg by mouth daily.   FISH OIL CONCENTRATE 1000 MG Caps Take 1,000 mg by mouth daily.   Garlic 10 MG Caps Take 10 mg by mouth daily.   ibuprofen 200 MG tablet Commonly known as:  ADVIL,MOTRIN Take 400 mg by mouth every 8 (eight) hours as needed (for pain.).   lisinopril 20 MG tablet Commonly known as:  PRINIVIL,ZESTRIL Take 1 tablet (20 mg total) by mouth daily. TAKE ONE TABLET BY MOUTH ONCE DAILY   MULTIVITAMIN PO Take by mouth daily.   simvastatin 20 MG tablet Commonly known as:  ZOCOR Take 1 tablet (20 mg total) by mouth at bedtime.   vitamin E 400 UNIT capsule Take 400 Units by mouth daily.       Allergies:  Allergies  Allergen Reactions  . Codeine Nausea Only    Family History: Family History  Problem Relation Age of  Onset  . Heart disease Mother        first MI in 69s  . Diabetes Mother   . Hyperlipidemia Mother   . Arthritis Mother        rheumatoid  . Cancer Father        lung  . Diabetes Father   . Arthritis Sister   . Hyperlipidemia Sister   . Hyperlipidemia Sister   . Hypertension Sister   . Hyperlipidemia Sister   . Kidney disease Neg Hx   . Prostate cancer Neg Hx   . Colon cancer Neg Hx     Social History:  reports that he has never smoked. He has never used smokeless tobacco. He reports that he drinks about 3.0 oz of alcohol per week. He reports that he does not use drugs.  ROS: UROLOGY Frequent Urination?: No Hard to postpone urination?: No Burning/pain with urination?: No Get up at night to urinate?: No Leakage of urine?: No Urine stream starts and stops?: No Trouble starting stream?: No Do you have to strain to urinate?: No Blood in urine?: No Urinary tract infection?: No Sexually transmitted disease?: No Injury to kidneys or bladder?: No Painful intercourse?: No Weak stream?: No Erection problems?: No Penile pain?: No  Gastrointestinal Nausea?: No Vomiting?: No Indigestion/heartburn?: No Constipation?: No  Constitutional Fever: No Night sweats?: No Weight loss?: No Fatigue?: No  Skin Skin rash/lesions?: No Itching?: No  Eyes Blurred vision?: No Double vision?: No  Ears/Nose/Throat Sore throat?: No Sinus problems?: Yes  Hematologic/Lymphatic Swollen glands?: No Easy bruising?: No  Cardiovascular Leg swelling?: No Chest pain?: No  Respiratory Cough?: No Shortness of breath?: No  Endocrine Excessive thirst?: No  Musculoskeletal Back pain?: No Joint pain?: No  Neurological Headaches?: No Dizziness?: No  Psychologic Depression?: No Anxiety?: No  Physical Exam: BP 126/79 (BP Location: Right Arm, Patient Position: Sitting, Cuff Size: Large)   Pulse 69   Ht 6' (1.829 m)   Wt 223 lb 11.2 oz (101.5 kg)   SpO2 99%   BMI 30.34  kg/m   Constitutional: Well nourished. Alert and oriented, No acute distress. HEENT: [place] AT, moist mucus membranes. Trachea midline, no masses. Cardiovascular: No clubbing, cyanosis, or edema. Respiratory: Normal respiratory effort, no increased work of breathing. GI: Abdomen is soft, non tender, non distended, no abdominal  masses. Liver and spleen not palpable.  No hernias appreciated.  Stool sample for occult testing is not indicated.   GU: No CVA tenderness.  No bladder fullness or masses.  Patient with uncircumcised phallus.  Foreskin easily retracted.  Mild balanitis. urethral meatus is patent.  No penile discharge. No penile lesions or rashes. Scrotum without lesions, cysts, rashes and/or edema.  Testicles are located scrotally bilaterally. No masses are appreciated in the testicles. Left and right epididymis are normal. Rectal: Patient with  normal sphincter tone. Anus and perineum without scarring or rashes. No rectal masses are appreciated. Prostate is approximately 55 grams, no nodules are appreciated. Seminal vesicles are normal. Skin: No rashes, bruises or suspicious lesions. Lymph: No cervical or inguinal adenopathy. Neurologic: Grossly intact, no focal deficits, moving all 4 extremities. Psychiatric: Normal mood and affect.   Laboratory Data: Lab Results  Component Value Date   WBC 7.8 02/03/2017   HGB 15.5 02/03/2017   HCT 45.2 02/03/2017   MCV 92.6 02/03/2017   PLT 295 02/03/2017    Lab Results  Component Value Date   CREATININE 1.17 08/04/2017    Lab Results  Component Value Date   PSA 0.3 12/08/2013  PSA 0.4 in 07/2017  Lab Results  Component Value Date   HGBA1C 5.2 08/04/2017       Component Value Date/Time   CHOL 168 02/03/2017 0952   CHOL 179 12/12/2015 0839   HDL 51 02/03/2017 0952   HDL 48 12/12/2015 0839   CHOLHDL 3.3 02/03/2017 0952   LDLCALC 90 02/03/2017 0952    Lab Results  Component Value Date   AST 25 02/03/2017   Lab Results    Component Value Date   ALT 31 02/03/2017   I have reviewed the labs  Assessment & Plan:    1. BPH with LUTS  - IPSS score is 3/0, it is stable  - Continue conservative management, avoiding bladder irritants and timed voiding's  - RTC in 12 months for IPSS, PSA and exam   Return in about 1 year (around 09/10/2018) for I PSS and exam .  These notes generated with voice recognition software. I apologize for typographical errors.  Zara Council, PA-C  [facility] [address] [place], [place] [address] [phone]

## 2017-09-09 ENCOUNTER — Encounter: Payer: Self-pay | Admitting: Urology

## 2017-09-09 ENCOUNTER — Ambulatory Visit (INDEPENDENT_AMBULATORY_CARE_PROVIDER_SITE_OTHER): Payer: Federal, State, Local not specified - PPO | Admitting: Urology

## 2017-09-09 VITALS — BP 126/79 | HR 69 | Ht 72.0 in | Wt 223.7 lb

## 2017-09-09 DIAGNOSIS — N138 Other obstructive and reflux uropathy: Secondary | ICD-10-CM | POA: Diagnosis not present

## 2017-09-09 DIAGNOSIS — N401 Enlarged prostate with lower urinary tract symptoms: Secondary | ICD-10-CM

## 2017-09-22 ENCOUNTER — Encounter: Payer: Self-pay | Admitting: Family Medicine

## 2017-12-21 ENCOUNTER — Telehealth: Payer: Self-pay | Admitting: Family Medicine

## 2017-12-21 NOTE — Telephone Encounter (Signed)
Left message asking pt to call me at [phone] to schedule AWV-S. Last AWV 12/11/14.  Please verify if pt is still enrolled in Medicare part B. VDM (DD)

## 2018-01-01 ENCOUNTER — Ambulatory Visit: Payer: Federal, State, Local not specified - PPO

## 2018-01-12 ENCOUNTER — Ambulatory Visit: Payer: Federal, State, Local not specified - PPO

## 2018-02-04 ENCOUNTER — Encounter: Payer: Self-pay | Admitting: Family Medicine

## 2018-02-04 ENCOUNTER — Ambulatory Visit (INDEPENDENT_AMBULATORY_CARE_PROVIDER_SITE_OTHER): Payer: Federal, State, Local not specified - PPO | Admitting: Family Medicine

## 2018-02-04 VITALS — BP 138/88 | HR 72 | Temp 97.9°F | Ht 72.0 in | Wt 226.0 lb

## 2018-02-04 DIAGNOSIS — I10 Essential (primary) hypertension: Secondary | ICD-10-CM | POA: Diagnosis not present

## 2018-02-04 DIAGNOSIS — E7800 Pure hypercholesterolemia, unspecified: Secondary | ICD-10-CM

## 2018-02-04 DIAGNOSIS — Z0000 Encounter for general adult medical examination without abnormal findings: Secondary | ICD-10-CM | POA: Diagnosis not present

## 2018-02-04 MED ORDER — LISINOPRIL 20 MG PO TABS: 20.0000 mg | ORAL_TABLET | Freq: Every day | ORAL | 3 refills | Status: DC

## 2018-02-04 MED ORDER — SIMVASTATIN 20 MG PO TABS: 20.0000 mg | ORAL_TABLET | Freq: Every day | ORAL | 3 refills | Status: DC

## 2018-02-04 NOTE — Patient Instructions (Signed)
Looking into intermittent fasting Think about only eating during an 8 hour window during the day and fasting otherwise   Preventive Care 65 Years and Older, Male Preventive care refers to lifestyle choices and visits with your health care provider that can promote health and wellness. What does preventive care include?  A yearly physical exam. This is also called an annual well check.  Dental exams once or twice a year.  Routine eye exams. Ask your health care provider how often you should have your eyes checked.  Personal lifestyle choices, including: ? Daily care of your teeth and gums. ? Regular physical activity. ? Eating a healthy diet. ? Avoiding tobacco and drug use. ? Limiting alcohol use. ? Practicing safe sex. ? Taking low doses of aspirin every day. ? Taking vitamin and mineral supplements as recommended by your health care provider. What happens during an annual well check? The services and screenings done by your health care provider during your annual well check will depend on your age, overall health, lifestyle risk factors, and family history of disease. Counseling Your health care provider may ask you questions about your:  Alcohol use.  Tobacco use.  Drug use.  Emotional well-being.  Home and relationship well-being.  Sexual activity.  Eating habits.  History of falls.  Memory and ability to understand (cognition).  Work and work Statistician.  Screening You may have the following tests or measurements:  Height, weight, and BMI.  Blood pressure.  Lipid and cholesterol levels. These may be checked every 5 years, or more frequently if you are over 35 years old.  Skin check.  Lung cancer screening. You may have this screening every year starting at age 40 if you have a 30-pack-year history of smoking and currently smoke or have quit within the past 15 years.  Fecal occult blood test (FOBT) of the stool. You may have this test every year  starting at age 7.  Flexible sigmoidoscopy or colonoscopy. You may have a sigmoidoscopy every 5 years or a colonoscopy every 10 years starting at age 56.  Prostate cancer screening. Recommendations will vary depending on your family history and other risks.  Hepatitis C blood test.  Hepatitis B blood test.  Sexually transmitted disease (STD) testing.  Diabetes screening. This is done by checking your blood sugar (glucose) after you have not eaten for a while (fasting). You may have this done every 1-3 years.  Abdominal aortic aneurysm (AAA) screening. You may need this if you are a current or former smoker.  Osteoporosis. You may be screened starting at age 65 if you are at high risk.  Talk with your health care provider about your test results, treatment options, and if necessary, the need for more tests. Vaccines Your health care provider may recommend certain vaccines, such as:  Influenza vaccine. This is recommended every year.  Tetanus, diphtheria, and acellular pertussis (Tdap, Td) vaccine. You may need a Td booster every 10 years.  Varicella vaccine. You may need this if you have not been vaccinated.  Zoster vaccine. You may need this after age 39.  Measles, mumps, and rubella (MMR) vaccine. You may need at least one dose of MMR if you were born in 1957 or later. You may also need a second dose.  Pneumococcal 13-valent conjugate (PCV13) vaccine. One dose is recommended after age 34.  Pneumococcal polysaccharide (PPSV23) vaccine. One dose is recommended after age 79.  Meningococcal vaccine. You may need this if you have certain conditions.  Hepatitis  A vaccine. You may need this if you have certain conditions or if you travel or work in places where you may be exposed to hepatitis A.  Hepatitis B vaccine. You may need this if you have certain conditions or if you travel or work in places where you may be exposed to hepatitis B.  Haemophilus influenzae type b (Hib)  vaccine. You may need this if you have certain risk factors.  Talk to your health care provider about which screenings and vaccines you need and how often you need them. This information is not intended to replace advice given to you by your health care provider. Make sure you discuss any questions you have with your health care provider. Document Released: 05/11/2015 Document Revised: 01/02/2016 Document Reviewed: 02/13/2015 Elsevier Interactive Patient Education  [employer].

## 2018-02-04 NOTE — Progress Notes (Signed)
Patient: Ruben Garcia, Male    DOB: 11/23/1945, 72 y.o.   MRN: [id number] Visit Date: 02/04/2018  Today's Provider: Lavon Paganini, MD   Chief Complaint  Patient presents with  . Annual Exam   Subjective:  I, Tiburcio Pea, CMA, am acting as a scribe for Lavon Paganini, MD.    Annual physical exam Ruben Garcia is a 72 y.o. male who presents today for health maintenance and complete physical. He feels well. He reports exercising 2-3 days per week. He reports he is sleeping well.  -----------------------------------------------------------------   Review of Systems  Constitutional: Negative.   HENT: Negative.   Eyes: Negative.   Respiratory: Negative.   Cardiovascular: Negative.   Gastrointestinal: Negative.   Endocrine: Negative.   Genitourinary: Negative.   Musculoskeletal: Negative.   Skin: Negative.   Allergic/Immunologic: Negative.   Neurological: Negative.   Hematological: Negative.   Psychiatric/Behavioral: Negative.     Social History      He  reports that he has never smoked. He has never used smokeless tobacco. He reports that he drinks about 5.0 standard drinks of alcohol per week. He reports that he does not use drugs.       Social History   Socioeconomic History  . Marital status: Married    Spouse name: Pamala Hurry  . Number of children: 2  . Years of education: 2 years college  . Highest education level: Not on file  Occupational History  . Occupation: retired    Comment: Actor in 2005  Social Needs  . Financial resource strain: Not on file  . Food insecurity:    Worry: Not on file    Inability: Not on file  . Transportation needs:    Medical: Not on file    Non-medical: Not on file  Tobacco Use  . Smoking status: Never Smoker  . Smokeless tobacco: Never Used  Substance and Sexual Activity  . Alcohol use: Yes    Alcohol/week: 5.0 standard drinks    Types: 5 Cans of beer per week    Comment: weekends beer, 4- 6 total  Miller [employer]  . Drug use: No  . Sexual activity: Yes    Partners: Female  Lifestyle  . Physical activity:    Days per week: Not on file    Minutes per session: Not on file  . Stress: Not on file  Relationships  . Social connections:    Talks on phone: Not on file    Gets together: Not on file    Attends religious service: Not on file    Active member of club or organization: Not on file    Attends meetings of clubs or organizations: Not on file    Relationship status: Not on file  Other Topics Concern  . Not on file  Social History Narrative   Always uses seat belts. Smoke alarm and carbon monoxide detector in the home. Guns in the home stored in locked cabinet.Caffeine use: Coffee 2 servings per day, moderate amount. Exercise: Moderate walking 2 - 4 miles daily, 2 - 3 days per week.    Marital status:  Married x 47 years, happily.      Children:  2 children; 1 grandchild (69).      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      Tobacco: never      Alcohol:  Weekends; beer 4-6 per weekend.      Exercise:  Walking every other  day 2.5 miles three times per day.      Seatbelt: 100%; no texting      Guns: secured guns.      Living Will: completed in 2014.  FULL CODE but no prolonged measures.        ADLs: independent with ADLs; no assistant devices    Past Medical History:  Diagnosis Date  . Allergic rhinitis, cause unspecified   . Basal cell carcinoma of skin, site unspecified 04/28/2010   Nasal; [name].  [name] Kitchen BPH (benign prostatic hypertrophy)   . Essential hypertension, benign   . Hemorrhoids, internal   . Incomplete bladder emptying   . Other abnormal glucose   . Over weight   . Personal history of colonic polyps   . Personal history of other genital system and obstetric disorders(V13.29)   . Pure hypercholesterolemia   . Unspecified disorder of kidney and ureter   . Unspecified disorder of liver      Patient Active Problem List   Diagnosis Date  Noted  . Prediabetes 02/03/2017  . Obesity 02/03/2017  . Full thickness rotator cuff tear 04/08/2016  . Impingement syndrome of shoulder region 04/08/2016  . 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 Personal history of other malignant neoplasm of skin 12/11/2014  . Encounter for general adult medical examination without abnormal findings 11/29/2012  . Essential (primary) hypertension 11/29/2012  . Pure hypercholesterolemia 11/29/2012    Past Surgical History:  Procedure Laterality Date  . BASAL CELL CARCINOMA EXCISION  2012   Facial        [place]  . COLONOSCOPY  11/13/2010   single polyp; repeatin 5 years; [place]  . COLONOSCOPY WITH PROPOFOL N/A 12/13/2015   Procedure: COLONOSCOPY WITH PROPOFOL;  Surgeon: [name], MD;  [facility] END[facility];  Service: Endoscopy;  Laterality: N/A;  . ear surgery  05/2012   [name].  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  Benign.  [name] Kitchen [place]  . LAPAROSCOPIC CHOLECYSTECTOMY  1982  . rotator cuff surgery Right 05/14/2016  . [place] History        Family Status  Relation Name Status  . Mother  Deceased at age 9       heart disease  . Father  Deceased at age 49       lung cancer  . Sister 1 Alive  . Sister 2 Alive  . Sister 3 Alive  . Neg Hx  (Not Specified)        His family history includes Arthritis in his mother and sister; Cancer in his father; Diabetes in his father and mother; Heart disease in his mother; Hyperlipidemia in his mother, sister, sister, and sister; Hypertension in his sister. There is no history of Kidney disease, Prostate cancer, or Colon cancer.      Allergies  Allergen Reactions  . Codeine Nausea Only     Current Outpatient Medications:  .  aspirin EC 81 MG tablet, Take 81 mg by mouth daily., Disp: , Rfl:  .  Garlic 10 MG CAPS, Take 10 mg by mouth daily. , Disp: , Rfl:  .  ibuprofen (ADVIL,MOTRIN) 200 MG tablet, Take 400 mg by mouth every 8  (eight) hours as needed (for pain.)., Disp: , Rfl:  .  lisinopril (PRINIVIL,ZESTRIL) 20 MG tablet, Take 1 tablet (20 mg total) by mouth daily. TAKE ONE TABLET BY MOUTH ONCE DAILY, Disp: 90 tablet, Rfl: 3 .  Multiple Vitamins-Minerals (MULTIVITAMIN  PO), Take by mouth daily., Disp: , Rfl:  .  Omega-3 Fatty Acids (FISH OIL CONCENTRATE) 1000 MG CAPS, Take 1,000 mg by mouth daily. , Disp: , Rfl:  .  simvastatin (ZOCOR) 20 MG tablet, Take 1 tablet (20 mg total) by mouth at bedtime., Disp: 90 tablet, Rfl: 3 .  vitamin E 400 UNIT capsule, Take 400 Units by mouth daily., Disp: , Rfl:    Patient Care Team: Virginia Crews, MD as PCP - General (Family Medicine)      Objective:   Vitals: BP 138/88 (BP Location: Right Arm, Patient Position: Sitting, Cuff Size: Large)   Pulse 72   Temp 97.9 F (36.6 C) (Oral)   Ht 6' (1.829 m)   Wt 226 lb (102.5 kg)   SpO2 98%   BMI 30.65 kg/m    Vitals:   02/04/18 0853  BP: 138/88  Pulse: 72  Temp: 97.9 F (36.6 C)  TempSrc: Oral  SpO2: 98%  Weight: 226 lb (102.5 kg)  Height: 6' (1.829 m)     Physical Exam  Constitutional: He is oriented to person, place, and time. He appears well-developed and well-nourished. No distress.  HENT:  Head: Normocephalic and atraumatic.  Right Ear: External ear normal.  Left Ear: External ear normal.  Nose: Nose normal.  Mouth/Throat: Oropharynx is clear and moist.  Eyes: Pupils are equal, round, and reactive to light. Conjunctivae and EOM are normal. No scleral icterus.  Neck: Neck supple. No thyromegaly present.  Cardiovascular: Normal rate, regular rhythm, normal heart sounds and intact distal pulses.  No murmur heard. Pulmonary/Chest: Effort normal and breath sounds normal. No respiratory distress. He has no wheezes. He has no rales.  Abdominal: Soft. Bowel sounds are normal. He exhibits no distension. There is no tenderness. There is no rebound and no guarding.  Musculoskeletal: He exhibits no edema or  deformity.  Lymphadenopathy:    He has no cervical adenopathy.  Neurological: He is alert and oriented to person, place, and time.  Skin: Skin is warm and dry. Capillary refill takes less than 2 seconds. No rash noted.  Psychiatric: He has a normal mood and affect. His behavior is normal.  Vitals reviewed.    Depression Screen PHQ 2/9 Scores 02/04/2018 02/03/2017 12/11/2015 06/05/2015  PHQ - 2 Score 0 0 0 0  PHQ- 9 Score 0 - - -     Assessment & Plan:     Routine Health Maintenance and Physical Exam  Exercise Activities and Dietary recommendations Goals   None     Immunization History  Administered Date(s) Administered  . Influenza, High Dose Seasonal PF 01/29/2017, 12/24/2017  . Influenza-Unspecified 01/28/2011, 02/02/2014, 01/28/2015  . Pneumococcal Conjugate-13 12/05/2013  . Pneumococcal Polysaccharide-23 06/05/2015  . Tdap 05/03/2008  . Zoster 03/12/2011    Health Maintenance  Topic Date Garcia  . Samul Dada  05/03/2018  . COLONOSCOPY  12/12/2025  . INFLUENZA VACCINE  Completed  . Hepatitis C Screening  Completed  . PNA vac Low Risk Adult  Completed     Discussed health benefits of physical activity, and encouraged him to engage in regular exercise appropriate for his age and condition.    --------------------------------------------------------------------  Problem List Items Addressed This Visit      Cardiovascular and Mediastinum   Essential (primary) hypertension   Relevant Medications   simvastatin (ZOCOR) 20 MG tablet   lisinopril (PRINIVIL,ZESTRIL) 20 MG tablet   Other Relevant Orders   Comprehensive metabolic panel     Other   Pure  hypercholesterolemia   Relevant Medications   simvastatin (ZOCOR) 20 MG tablet   lisinopril (PRINIVIL,ZESTRIL) 20 MG tablet   Other Relevant Orders   Comprehensive metabolic panel   Lipid panel    Other Visit Diagnoses    Encounter for annual physical exam    -  Primary   Relevant Orders   Comprehensive  metabolic panel   Lipid panel   CBC w/Diff/Platelet   Hemoglobin A1c       Return in about 6 months (around 08/06/2018) for chronic disease f/u.   The entirety of the information documented in the History of Present Illness, Review of Systems and Physical Exam were personally obtained by me. Portions of this information were initially documented by Tiburcio Pea, CMA and reviewed by me for thoroughness and accuracy.    Virginia Crews, MD, MPH [facility] 02/04/2018 10:26 AM

## 2018-02-04 NOTE — Progress Notes (Deleted)
Patient: Ruben Garcia, Male    DOB: Nov 03, 1945, 72 y.o.   MRN: [id number] Visit Date: 02/04/2018  Today's Provider: Lavon Paganini, MD   No chief complaint on file.  Subjective:    Annual wellness visit HAIDER HORNADAY is a 72 y.o. male who presents today for his Subsequent Annual Wellness Visit. He feels {DESC; WELL/FAIRLY WELL/POORLY:18703}. He reports exercising ***. He reports he is sleeping {DESC; WELL/FAIRLY WELL/POORLY:18703}.  -----------------------------------------------------------   Review of Systems  Constitutional: Negative.   HENT: Negative.   Eyes: Negative.   Respiratory: Negative.   Cardiovascular: Negative.   Gastrointestinal: Negative.   Endocrine: Negative.   Genitourinary: Negative.   Musculoskeletal: Negative.   Skin: Negative.   Allergic/Immunologic: Negative.   Neurological: Negative.   Hematological: Negative.   Psychiatric/Behavioral: Negative.     Social History   Socioeconomic History  . Marital status: Married    Spouse name: Pamala Hurry  . Number of children: 2  . Years of education: 2 years college  . Highest education level: Not on file  Occupational History  . Occupation: retired    Comment: Actor in 2005  Social Needs  . Financial resource strain: Not on file  . Food insecurity:    Worry: Not on file    Inability: Not on file  . Transportation needs:    Medical: Not on file    Non-medical: Not on file  Tobacco Use  . Smoking status: Never Smoker  . Smokeless tobacco: Never Used  Substance and Sexual Activity  . Alcohol use: Yes    Alcohol/week: 5.0 standard drinks    Types: 5 Cans of beer per week    Comment: weekends beer, 4- 6 total Miller [employer]  . Drug use: No  . Sexual activity: Yes    Partners: Female  Lifestyle  . Physical activity:    Days per week: Not on file    Minutes per session: Not on file  . Stress: Not on file  Relationships  . Social connections:    Talks on phone: Not on file    Gets  together: Not on file    Attends religious service: Not on file    Active member of club or organization: Not on file    Attends meetings of clubs or organizations: Not on file    Relationship status: Not on file  . Intimate partner violence:    Fear of current or ex partner: Not on file    Emotionally abused: Not on file    Physically abused: Not on file    Forced sexual activity: Not on file  Other Topics Concern  . Not on file  Social History Narrative   Always uses seat belts. Smoke alarm and carbon monoxide detector in the home. Guns in the home stored in locked cabinet.Caffeine use: Coffee 2 servings per day, moderate amount. Exercise: Moderate walking 2 - 4 miles daily, 2 - 3 days per week.    Marital status:  Married x 47 years, happily.      Children:  2 children; 1 grandchild (39).      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      Tobacco: never      Alcohol:  Weekends; beer 4-6 per weekend.      Exercise:  Walking every other day 2.5 miles three times per day.      Seatbelt: 100%; no texting      Guns: secured guns.  Living Will: completed in 2014.  FULL CODE but no prolonged measures.        ADLs: independent with ADLs; no assistant devices    Patient Active Problem List   Diagnosis Date Noted  . Prediabetes 02/03/2017  . Obesity 02/03/2017  . Full thickness rotator cuff tear 04/08/2016  . Impingement syndrome of shoulder region 04/08/2016  . 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 Personal history of other malignant neoplasm of skin 12/11/2014  . Encounter for general adult medical examination without abnormal findings 11/29/2012  . Essential (primary) hypertension 11/29/2012  . Pure hypercholesterolemia 11/29/2012    Past Surgical History:  Procedure Laterality Date  . BASAL CELL CARCINOMA EXCISION  2012   Facial        [place]  . COLONOSCOPY  11/13/2010   single polyp; repeatin 5 years; [place]  .  COLONOSCOPY WITH PROPOFOL N/A 12/13/2015   Procedure: COLONOSCOPY WITH PROPOFOL;  Surgeon: [name], MD;  Location[facility] ENDOSCOPY;  Service: Endoscopy;  Laterality: N/A;  . ear surgery  05/2012   [name].  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  Benign.  [name] Kitchen [place]  . LAPAROSCOPIC CHOLECYSTECTOMY  1982  . rotator cuff surgery Right 05/14/2016  . VASECTOMY  1978    His family history includes Arthritis in his mother and sister; Cancer in his father; Diabetes in his father and mother; Heart disease in his mother; Hyperlipidemia in his mother, sister, sister, and sister; Hypertension in his sister.     Previous Medications   ASPIRIN EC 81 MG TABLET    Take 81 mg by mouth daily.   GARLIC 10 MG CAPS    Take 10 mg by mouth daily.    IBUPROFEN (ADVIL,MOTRIN) 200 MG TABLET    Take 400 mg by mouth every 8 (eight) hours as needed (for pain.).   LISINOPRIL (PRINIVIL,ZESTRIL) 20 MG TABLET    Take 1 tablet (20 mg total) by mouth daily. TAKE ONE TABLET BY MOUTH ONCE DAILY   MULTIPLE VITAMINS-MINERALS (MULTIVITAMIN PO)    Take by mouth daily.   OMEGA-3 FATTY ACIDS (FISH OIL CONCENTRATE) 1000 MG CAPS    Take 1,000 mg by mouth daily.    SIMVASTATIN (ZOCOR) 20 MG TABLET    Take 1 tablet (20 mg total) by mouth at bedtime.   VITAMIN E 400 UNIT CAPSULE    Take 400 Units by mouth daily.    Patient Care Team: Virginia Crews, MD as PCP - General (Family Medicine)      Objective:   Vitals: There were no vitals taken for this visit.  Physical Exam  Activities of Daily Living No flowsheet data found.  Fall Risk Assessment Fall Risk  02/03/2017 12/11/2015 06/05/2015 12/11/2014 12/05/2013  Falls in the past year? No No No No No     Depression Screen PHQ 2/9 Scores 02/03/2017 12/11/2015 06/05/2015 12/11/2014  PHQ - 2 Score 0 0 0 0    Cognitive Testing - 6-CIT  Correct? Score   What year is it? {yes no:22349} {0-4:31231} 0 or 4  What month is it? {yes no:22349} {0-3:21082} 0 or 3   Memorize:    Pia Mau,  44,  [place],      What time is it? (within 1 hour) {yes no:22349} {0-3:21082} 0 or 3  Count backwards from 20 {yes no:22349} {0-4:31231} 0, 2, or 4  Name the months of the year {yes no:22349} {0-4:31231} 0, 2,  or 4  Repeat name & address above {yes no:22349} {0-10:5044} 0, 2, 4, 6, 8, or 10       TOTAL SCORE  ***/28   Interpretation:  {normal/abnormal:11317::"Normal"}  Normal (0-7) Abnormal (8-28)       Assessment & Plan:     Annual Wellness Visit  Reviewed patient's Family Medical History Reviewed and updated list of patient's medical providers Assessment of cognitive impairment was done Assessed patient's functional ability Established a written schedule for health screening [place] Completed and Reviewed  Exercise Activities and Dietary recommendations Goals   None     Immunization History  Administered Date(s) Administered  . Influenza, High Dose Seasonal PF 01/29/2017  . Influenza-Unspecified 01/28/2011, 02/02/2014, 01/28/2015  . Pneumococcal Conjugate-13 12/05/2013  . Pneumococcal Polysaccharide-23 06/05/2015  . Tdap 05/03/2008  . Zoster 03/12/2011    Health Maintenance  Topic Date Due  . INFLUENZA VACCINE  11/26/2017  . TETANUS/TDAP  05/03/2018  . COLONOSCOPY  12/12/2025  . Hepatitis C Screening  Completed  . PNA vac Low Risk Adult  Completed     Discussed health benefits of physical activity, and encouraged him to engage in regular exercise appropriate for his age and condition.    ------------------------------------------------------------------------------------------------------------

## 2018-02-05 ENCOUNTER — Telehealth: Payer: Self-pay

## 2018-02-05 DIAGNOSIS — E875 Hyperkalemia: Secondary | ICD-10-CM

## 2018-02-05 LAB — LIPID PANEL
CHOLESTEROL TOTAL: 168 mg/dL (ref 100–199)
Chol/HDL Ratio: 3.6 ratio (ref 0.0–5.0)
HDL: 47 mg/dL (ref >39)
LDL CALC: 93 mg/dL (ref 0–99)
Triglycerides: 139 mg/dL (ref 0–149)
VLDL Cholesterol Cal: 28 mg/dL (ref 5–40)

## 2018-02-05 LAB — CBC WITH DIFFERENTIAL/PLATELET
BASOS ABS: 0 10*3/uL (ref 0.0–0.2)
BASOS: 1 %
EOS (ABSOLUTE): 0.1 10*3/uL (ref 0.0–0.4)
Eos: 1 %
Hematocrit: 42.9 % (ref 37.5–51.0)
Hemoglobin: 15 g/dL (ref 13.0–17.7)
IMMATURE GRANS (ABS): 0 10*3/uL (ref 0.0–0.1)
Immature Granulocytes: 0 %
LYMPHS: 33 %
Lymphocytes Absolute: 2.3 10*3/uL (ref 0.7–3.1)
MCH: 32.1 pg (ref 26.6–33.0)
MCHC: 35 g/dL (ref 31.5–35.7)
MCV: 92 fL (ref 79–97)
Monocytes Absolute: 0.5 10*3/uL (ref 0.1–0.9)
Monocytes: 8 %
Neutrophils Absolute: 4 10*3/uL (ref 1.4–7.0)
Neutrophils: 57 %
PLATELETS: 267 10*3/uL (ref 150–450)
RBC: 4.68 x10E6/uL (ref 4.14–5.80)
RDW: 12.2 % — AB (ref 12.3–15.4)
WBC: 7 10*3/uL (ref 3.4–10.8)

## 2018-02-05 LAB — COMPREHENSIVE METABOLIC PANEL
ALK PHOS: 38 IU/L — AB (ref 39–117)
ALT: 37 IU/L (ref 0–44)
AST: 31 IU/L (ref 0–40)
Albumin/Globulin Ratio: 1.8 (ref 1.2–2.2)
Albumin: 4.6 g/dL (ref 3.5–4.8)
BILIRUBIN TOTAL: 1 mg/dL (ref 0.0–1.2)
BUN / CREAT RATIO: 10 (ref 10–24)
BUN: 12 mg/dL (ref 8–27)
CO2: 25 mmol/L (ref 20–29)
CREATININE: 1.15 mg/dL (ref 0.76–1.27)
Calcium: 9.9 mg/dL (ref 8.6–10.2)
Chloride: 102 mmol/L (ref 96–106)
GFR calc non Af Amer: 64 mL/min/{1.73_m2} (ref >59)
GFR, EST AFRICAN AMERICAN: 74 mL/min/{1.73_m2} (ref >59)
GLOBULIN, TOTAL: 2.5 g/dL (ref 1.5–4.5)
Glucose: 101 mg/dL — ABNORMAL HIGH (ref 65–99)
Potassium: 5.3 mmol/L — ABNORMAL HIGH (ref 3.5–5.2)
SODIUM: 141 mmol/L (ref 134–144)
Total Protein: 7.1 g/dL (ref 6.0–8.5)

## 2018-02-05 LAB — HEMOGLOBIN A1C
ESTIMATED AVERAGE GLUCOSE: 108 mg/dL
HEMOGLOBIN A1C: 5.4 % (ref 4.8–5.6)

## 2018-02-05 NOTE — Telephone Encounter (Signed)
Patient advised. Lab ordered as future order. Just needs to be released when patient comes in next week.

## 2018-02-05 NOTE — Telephone Encounter (Signed)
-----   Message from Virginia Crews, MD sent at 02/05/2018 11:33 AM EDT ----- Blood sugar is very slightly high if fasting, but A1c is normal.  No prediabetes or diabetes.  Normal kidney function and liver function.  Potassium slightly elevated at 5.3.  Watch potassium dietary intake and stop any multivitamins or potassium supplement you are taking.  Would like to recheck in 1 week.  Cholesterol is well controlled.  Normal blood counts.  Virginia Crews, MD, MPH [facility] 02/05/2018 11:33 AM

## 2018-02-11 ENCOUNTER — Other Ambulatory Visit: Payer: Self-pay

## 2018-02-11 DIAGNOSIS — E875 Hyperkalemia: Secondary | ICD-10-CM

## 2018-02-12 LAB — COMPREHENSIVE METABOLIC PANEL
A/G RATIO: 1.8 (ref 1.2–2.2)
ALBUMIN: 4.6 g/dL (ref 3.5–4.8)
ALT: 38 IU/L (ref 0–44)
AST: 28 IU/L (ref 0–40)
Alkaline Phosphatase: 40 IU/L (ref 39–117)
BUN / CREAT RATIO: 10 (ref 10–24)
BUN: 12 mg/dL (ref 8–27)
Bilirubin Total: 1.4 mg/dL — ABNORMAL HIGH (ref 0.0–1.2)
CALCIUM: 9.3 mg/dL (ref 8.6–10.2)
CO2: 22 mmol/L (ref 20–29)
Chloride: 99 mmol/L (ref 96–106)
Creatinine, Ser: 1.15 mg/dL (ref 0.76–1.27)
GFR, EST AFRICAN AMERICAN: 74 mL/min/{1.73_m2} (ref >59)
GFR, EST NON AFRICAN AMERICAN: 64 mL/min/{1.73_m2} (ref >59)
GLOBULIN, TOTAL: 2.6 g/dL (ref 1.5–4.5)
Glucose: 108 mg/dL — ABNORMAL HIGH (ref 65–99)
POTASSIUM: 4.5 mmol/L (ref 3.5–5.2)
Sodium: 139 mmol/L (ref 134–144)
TOTAL PROTEIN: 7.2 g/dL (ref 6.0–8.5)

## 2018-02-15 ENCOUNTER — Telehealth: Payer: Self-pay | Admitting: Family Medicine

## 2018-02-15 NOTE — Telephone Encounter (Signed)
Pt needing to know the potassium level number. Please call pt back to disclose result.  Thanks, [employer]

## 2018-02-16 NOTE — Telephone Encounter (Signed)
Patient advised of potassium levels.

## 2018-07-29 ENCOUNTER — Telehealth: Payer: Self-pay | Admitting: Family Medicine

## 2018-07-29 NOTE — Telephone Encounter (Signed)
Pt called in with his email address so we can do the e-visit  Thanks teri

## 2018-07-30 NOTE — Telephone Encounter (Signed)
Spoke with patient and WebEx scheduled.

## 2018-08-04 NOTE — Progress Notes (Addendum)
Patient: Ruben Garcia Male    DOB: 16-May-1945   73 y.o.   MRN: [id number] Visit Date: 08/05/2018  Today's Provider: Lavon Paganini, MD   Chief Complaint  Patient presents with  . Hypertension  . Hyperlipidemia   Subjective:    Virtual Visit via Video Note  I connected with Ruben Garcia on 08/13/18 at  8:40 AM EDT by a video enabled telemedicine application and verified that I am speaking with the correct person using two identifiers.   I discussed the limitations of evaluation and management by telemedicine and the availability of in person appointments. The patient expressed understanding and agreed to proceed.   Patient location: home Provider location: [place] involved in the visit: patient, provider   Interactive audio and video communications were attempted, although failed due to patient's inability to connect to video. Continued visit with audio only interaction with patient agreement.   HPI  Hypertension, follow-up:  BP Readings from Last 3 Encounters:  08/05/18 121/86  02/04/18 138/88  09/09/17 126/79    He was last seen for hypertension 6 months ago.  BP at that visit was 138/88. Management since that visit includes no changes .He reports good compliance with treatment. He is not having side effects.  He is exercising. He is adherent to low salt diet.   Outside blood pressures are being checked at home. He is experiencing none.  Patient denies chest pain, chest pressure/discomfort, claudication, dyspnea, exertional chest pressure/discomfort, fatigue, irregular heart beat, lower extremity edema, near-syncope, orthopnea, palpitations, paroxysmal nocturnal dyspnea, syncope and tachypnea.   Cardiovascular risk factors include advanced age (older than 47 for men, 48 for women), dyslipidemia, hypertension, male gender and obesity (BMI >= 30 kg/m2).  Use of agents associated with hypertension: none.    ------------------------------------------------------------------------    Lipid/Cholesterol, Follow-up:   Last seen for this 6 months ago.  Management since that visit includes no changes.  Last Lipid Panel:    Component Value Date/Time   CHOL 168 02/04/2018 0938   TRIG 139 02/04/2018 0938   HDL 47 02/04/2018 0938   CHOLHDL 3.6 02/04/2018 0938   CHOLHDL 3.3 02/03/2017 0952   LDLCALC 93 02/04/2018 0938   LDLCALC 90 02/03/2017 0952    He reports good compliance with treatment. He is not having side effects.   Wt Readings from Last 3 Encounters:  02/04/18 226 lb (102.5 kg)  09/09/17 223 lb 11.2 oz (101.5 kg)  08/04/17 221 lb (100.2 kg)    ------------------------------------------------------------------------  Allergies  Allergen Reactions  . Codeine Nausea Only     Current Outpatient Medications:  .  aspirin EC 81 MG tablet, Take 81 mg by mouth daily., Disp: , Rfl:  .  Garlic 10 MG CAPS, Take 10 mg by mouth daily. , Disp: , Rfl:  .  ibuprofen (ADVIL,MOTRIN) 200 MG tablet, Take 400 mg by mouth every 8 (eight) hours as needed (for pain.)., Disp: , Rfl:  .  lisinopril (PRINIVIL,ZESTRIL) 20 MG tablet, Take 1 tablet (20 mg total) by mouth daily. TAKE ONE TABLET BY MOUTH ONCE DAILY, Disp: 90 tablet, Rfl: 3 .  Multiple Vitamins-Minerals (MULTIVITAMIN PO), Take by mouth daily., Disp: , Rfl:  .  Omega-3 Fatty Acids (FISH OIL CONCENTRATE) 1000 MG CAPS, Take 1,000 mg by mouth daily. , Disp: , Rfl:  .  simvastatin (ZOCOR) 20 MG tablet, Take 1 tablet (20 mg total) by mouth at bedtime., Disp: 90 tablet, Rfl: 3 .  vitamin E 400 UNIT  capsule, Take 400 Units by mouth daily., Disp: , Rfl:   Review of Systems  Constitutional: Negative.   Respiratory: Negative.   Cardiovascular: Negative.   Musculoskeletal: Negative.     Social History   Tobacco Use  . Smoking status: Never Smoker  . Smokeless tobacco: Never Used  Substance Use Topics  . Alcohol use: Yes    Alcohol/week:  5.0 standard drinks    Types: 5 Cans of beer per week    Comment: weekends beer, 4- 6 total [employer]      Objective:   BP 121/86   Pulse 72  Vitals:   08/05/18 0838  BP: 121/86  Pulse: 72   Patient measured at home  Physical Exam Pulmonary:     Effort: Pulmonary effort is normal. No respiratory distress.         Assessment & Plan    I discussed the assessment and treatment plan with the patient. The patient was provided an opportunity to ask questions and all were answered. The patient agreed with the plan and demonstrated an understanding of the instructions.   The patient was advised to call back or seek an in-person evaluation if the symptoms worsen or if the condition fails to improve as anticipated.   Problem List Items Addressed This Visit      Cardiovascular and Mediastinum   Essential (primary) hypertension - Primary    Well controlled Continue lisinopril at current dose Plan to recheck BMP at next visit as unable to get labs currently due to pandemic F/u in 6 months        Other   Pure hypercholesterolemia    Well controlled on last check recheck FLP at CPE Continue simvastatin      Prediabetes    Last A1c within normal range Continue low carb diet Advised on diet and exercise Recheck A1c at CPE      Obesity    Discussed diet and exercise          Return in about 6 months (around 02/04/2019) for CPE after 02/05/19.   The entirety of the information documented in the History of Present Illness, Review of Systems and Physical Exam were personally obtained by me. Portions of this information were initially documented by Tiburcio Pea and [place], CMA and reviewed by me for thoroughness and accuracy.    Virginia Crews, MD, MPH [facility] 08/05/2018 8:53 AM

## 2018-08-05 ENCOUNTER — Encounter: Payer: Self-pay | Admitting: Family Medicine

## 2018-08-05 ENCOUNTER — Ambulatory Visit (INDEPENDENT_AMBULATORY_CARE_PROVIDER_SITE_OTHER): Payer: Federal, State, Local not specified - PPO | Admitting: Family Medicine

## 2018-08-05 VITALS — BP 121/86 | HR 72

## 2018-08-05 DIAGNOSIS — I10 Essential (primary) hypertension: Secondary | ICD-10-CM

## 2018-08-05 DIAGNOSIS — E7800 Pure hypercholesterolemia, unspecified: Secondary | ICD-10-CM

## 2018-08-05 DIAGNOSIS — Z6830 Body mass index (BMI) 30.0-30.9, adult: Secondary | ICD-10-CM

## 2018-08-05 DIAGNOSIS — E669 Obesity, unspecified: Secondary | ICD-10-CM | POA: Diagnosis not present

## 2018-08-05 DIAGNOSIS — R7303 Prediabetes: Secondary | ICD-10-CM

## 2018-08-05 NOTE — Assessment & Plan Note (Signed)
Well controlled Continue lisinopril at current dose Plan to recheck BMP at next visit as unable to get labs currently due to pandemic F/u in 6 months

## 2018-08-05 NOTE — Assessment & Plan Note (Signed)
Discussed diet and exercise 

## 2018-08-05 NOTE — Assessment & Plan Note (Signed)
Well controlled on last check recheck FLP at CPE Continue simvastatin

## 2018-08-05 NOTE — Assessment & Plan Note (Signed)
Last A1c within normal range Continue low carb diet Advised on diet and exercise Recheck A1c at CPE

## 2018-08-13 NOTE — Addendum Note (Signed)
Addended by: Virginia Crews on: 08/13/2018 09:43 AM   Modules accepted: Level of Service

## 2018-09-08 ENCOUNTER — Other Ambulatory Visit: Payer: Self-pay

## 2018-09-08 ENCOUNTER — Telehealth (INDEPENDENT_AMBULATORY_CARE_PROVIDER_SITE_OTHER): Payer: Federal, State, Local not specified - PPO | Admitting: Urology

## 2018-09-08 ENCOUNTER — Ambulatory Visit: Payer: Federal, State, Local not specified - PPO | Admitting: Urology

## 2018-09-08 ENCOUNTER — Telehealth: Payer: Self-pay | Admitting: Urology

## 2018-09-08 DIAGNOSIS — N401 Enlarged prostate with lower urinary tract symptoms: Secondary | ICD-10-CM

## 2018-09-08 DIAGNOSIS — N138 Other obstructive and reflux uropathy: Secondary | ICD-10-CM

## 2018-09-08 NOTE — Telephone Encounter (Signed)
Would you call Mr. Ruben Garcia and schedule a one year follow up for I PSS and exam?

## 2018-09-08 NOTE — Progress Notes (Signed)
Virtual Visit via Telephone Note  I connected with Ruben Garcia on 09/08/2018 at 0900 by telephone and verified that I am speaking with the correct person using two identifiers.  They are located at home.  I am located at my home.    This visit type was conducted due to national recommendations for restrictions regarding the COVID-19 Pandemic (e.g. social distancing).  This format is felt to be most appropriate for this patient at this time.  All issues noted in this document were discussed and addressed.  No physical exam was performed.   I discussed the limitations, risks, security and privacy concerns of performing an evaluation and management service by telephone and the availability of in person appointments. I also discussed with the patient that there may be a patient responsible charge related to this service. The patient expressed understanding and agreed to proceed.   History of Present Illness: Ruben Garcia is a 73 year old male with BPH with LU TS managed conservatively who is contacted by phone for his annual visit due to the COVID-19 pandemic.    He states he is still having a weak urinary stream, but he does not find it bothersome at this time.  He has not had incidences of urinary retention or feelings of incomplete emptying of his bladder.  Patient denies any gross hematuria, dysuria or suprapubic/flank pain.  Patient denies any fevers, chills, nausea or vomiting.    Observations/Objective: Ruben Garcia does not sound distressed and answers questions appropriately.    Assessment and Plan:  1. BPH with LU TS - will continue to manage conservatively at this time - since he over the age of 64 yrs and older and his PSA has continued to remain <1.0, he has choosen to discontinue screening at this time per NCCN guidelines  Follow Up Instructions:  Ruben Garcia will follow up in one year for I PSS and exam.     I discussed the assessment and treatment plan with the patient. The patient  was provided an opportunity to ask questions and all were answered. The patient agreed with the plan and demonstrated an understanding of the instructions.   The patient was advised to call back or seek an in-person evaluation if the symptoms worsen or if the condition fails to improve as anticipated.  I provided 5 minutes of non-face-to-face time during this encounter.   Ulice Follett, PA-C

## 2018-11-09 NOTE — Progress Notes (Signed)
Patient: Ruben Garcia Male    DOB: 08/29/1945   73 y.o.   MRN: [id number] Visit Date: 11/10/2018  Today's Provider: Lavon Paganini, MD   Chief Complaint  Patient presents with  . COVID exposure   Subjective:    Virtual Visit via Telephone Note  I connected with Ruben Garcia on 11/10/18 at  9:20 AM EDT by telephone and verified that I am speaking with the correct person using two identifiers.   Patient location: home Provider location: [place] involved in the visit: patient, provider   I discussed the limitations, risks, security and privacy concerns of performing an evaluation and management service by telephone and the availability of in person appointments. I also discussed with the patient that there may be a patient responsible charge related to this service. The patient expressed understanding and agreed to proceed.  HPI Patient is requesting to be tested for COVID-19. Patient states he had lunch with a friend that had been exposed to a positive COVID patient (friend had lunch with a different friend on 7/10 and then had lunch with the patient on 7/12).  His friend was notified on 7/13 that he had been exposed and was tested on 7/14 at the [place].  He is still awaiting results.  Patient denies any symptoms.    Allergies  Allergen Reactions  . Codeine Nausea Only     Current Outpatient Medications:  .  aspirin EC 81 MG tablet, Take 81 mg by mouth daily., Disp: , Rfl:  .  Garlic 10 MG CAPS, Take 10 mg by mouth daily. , Disp: , Rfl:  .  ibuprofen (ADVIL,MOTRIN) 200 MG tablet, Take 400 mg by mouth every 8 (eight) hours as needed (for pain.)., Disp: , Rfl:  .  lisinopril (PRINIVIL,ZESTRIL) 20 MG tablet, Take 1 tablet (20 mg total) by mouth daily. TAKE ONE TABLET BY MOUTH ONCE DAILY, Disp: 90 tablet, Rfl: 3 .  Multiple Vitamins-Minerals (MULTIVITAMIN PO), Take by mouth daily., Disp: , Rfl:  .  Omega-3 Fatty Acids (FISH OIL CONCENTRATE) 1000  MG CAPS, Take 1,000 mg by mouth daily. , Disp: , Rfl:  .  simvastatin (ZOCOR) 20 MG tablet, Take 1 tablet (20 mg total) by mouth at bedtime., Disp: 90 tablet, Rfl: 3 .  vitamin E 400 UNIT capsule, Take 400 Units by mouth daily., Disp: , Rfl:   Review of Systems  Constitutional: Negative.   Respiratory: Negative.   Cardiovascular: Negative.   Musculoskeletal: Negative.     Social History   Tobacco Use  . Smoking status: Never Smoker  . Smokeless tobacco: Never Used  Substance Use Topics  . Alcohol use: Yes    Alcohol/week: 5.0 standard drinks    Types: 5 Cans of beer per week    Comment: weekends beer, 4- 6 total [employer]      Objective:   There were no vitals taken for this visit. There were no vitals filed for this visit.   Physical Exam   No results found for any visits on 11/10/18.     Assessment & Plan     I discussed the assessment and treatment plan with the patient. The patient was provided an opportunity to ask questions and all were answered. The patient agreed with the plan and demonstrated an understanding of the instructions.   The patient was advised to call back or seek an in-person evaluation if the symptoms worsen or if the condition fails to improve  as anticipated.  1. Exposure to Covid-19 Virus - patient with possible COVID19 exposure - he is asymptomatic at this time - will go ahead and get OP COVID testing - advised to drive up to collection site for testing this morning -Discussed symptoms to watch for and return precautions -Advised that test results can take 5 to 7 days to come back and advised quarantine for him and all household members until test results and then longer if it is positive - Novel Coronavirus, NAA ([facility])    Return if symptoms worsen or fail to improve.   The entirety of the information documented in the History of Present Illness, Review of Systems and Physical Exam were personally obtained by me. Portions of this  information were initially documented by Tiburcio Pea, CMA and reviewed by me for thoroughness and accuracy.    , Dionne Bucy, MD MPH [facility]

## 2018-11-10 ENCOUNTER — Ambulatory Visit (INDEPENDENT_AMBULATORY_CARE_PROVIDER_SITE_OTHER): Payer: Federal, State, Local not specified - PPO | Admitting: Family Medicine

## 2018-11-10 ENCOUNTER — Encounter: Payer: Self-pay | Admitting: Family Medicine

## 2018-11-10 DIAGNOSIS — Z20828 Contact with and (suspected) exposure to other viral communicable diseases: Secondary | ICD-10-CM | POA: Diagnosis not present

## 2018-11-10 DIAGNOSIS — Z20822 Contact with and (suspected) exposure to covid-19: Secondary | ICD-10-CM

## 2018-11-14 LAB — NOVEL CORONAVIRUS, NAA: SARS-CoV-2, NAA: NOT DETECTED

## 2018-11-15 ENCOUNTER — Telehealth: Payer: Self-pay | Admitting: Family Medicine

## 2018-11-15 ENCOUNTER — Telehealth: Payer: Self-pay

## 2018-11-15 NOTE — Telephone Encounter (Signed)
Pt calling to get results from last Wed's COVID testing he had done at [facility].  Please call pt back with results.  Thanks, [employer]

## 2018-11-15 NOTE — Telephone Encounter (Signed)
Advised 

## 2018-11-15 NOTE — Telephone Encounter (Signed)
Unable to reach patient at this time, will try reaching out to patient at a later time. KW

## 2018-11-15 NOTE — Telephone Encounter (Signed)
-----   Message from Virginia Crews, MD sent at 11/15/2018  9:58 AM EDT ----- Please let patient know that COVID test is negative.  He should still contact us if he develops any symptoms.

## 2019-02-10 ENCOUNTER — Encounter: Payer: Self-pay | Admitting: Family Medicine

## 2019-02-10 ENCOUNTER — Ambulatory Visit (INDEPENDENT_AMBULATORY_CARE_PROVIDER_SITE_OTHER): Payer: Federal, State, Local not specified - PPO | Admitting: Family Medicine

## 2019-02-10 ENCOUNTER — Other Ambulatory Visit: Payer: Self-pay

## 2019-02-10 VITALS — BP 144/90 | HR 68 | Temp 96.9°F | Wt 223.4 lb

## 2019-02-10 DIAGNOSIS — Z125 Encounter for screening for malignant neoplasm of prostate: Secondary | ICD-10-CM

## 2019-02-10 DIAGNOSIS — Z23 Encounter for immunization: Secondary | ICD-10-CM | POA: Diagnosis not present

## 2019-02-10 DIAGNOSIS — E669 Obesity, unspecified: Secondary | ICD-10-CM

## 2019-02-10 DIAGNOSIS — N401 Enlarged prostate with lower urinary tract symptoms: Secondary | ICD-10-CM

## 2019-02-10 DIAGNOSIS — Z0000 Encounter for general adult medical examination without abnormal findings: Secondary | ICD-10-CM | POA: Diagnosis not present

## 2019-02-10 DIAGNOSIS — R7303 Prediabetes: Secondary | ICD-10-CM | POA: Diagnosis not present

## 2019-02-10 DIAGNOSIS — I10 Essential (primary) hypertension: Secondary | ICD-10-CM

## 2019-02-10 DIAGNOSIS — E7800 Pure hypercholesterolemia, unspecified: Secondary | ICD-10-CM

## 2019-02-10 DIAGNOSIS — Z6830 Body mass index (BMI) 30.0-30.9, adult: Secondary | ICD-10-CM

## 2019-02-10 MED ORDER — LISINOPRIL 40 MG PO TABS: 40.0000 mg | ORAL_TABLET | Freq: Every day | ORAL | 3 refills | Status: DC

## 2019-02-10 NOTE — Progress Notes (Signed)
Patient: Ruben Garcia, Male    DOB: 1946-01-02, 73 y.o.   MRN: [id number] Visit Date: 02/10/2019  Today's Provider: Lavon Paganini, MD   Chief Complaint  Patient presents with  . Annual Exam   Subjective:    I, Porsha McClurkin CMA, am acting as a scribe for Lavon Paganini, MD.    Complete Physical Ruben Garcia is a 73 y.o. male. He feels well. He reports exercising includes walking. He reports he is sleeping fairly well.  ----------------------------------------------------------- Last colonoscopy:12/13/2015  Review of Systems  Constitutional: Negative.   HENT: Negative.   Eyes: Negative.   Respiratory: Negative.   Cardiovascular: Negative.   Gastrointestinal: Negative.   Endocrine: Negative.   Genitourinary: Negative.   Musculoskeletal: Negative.   Skin: Negative.   Allergic/Immunologic: Negative.   Neurological: Negative.   Hematological: Negative.   Psychiatric/Behavioral: Negative.     Social History   Socioeconomic History  . Marital status: Married    Spouse name: Pamala Hurry  . Number of children: 2  . Years of education: 2 years college  . Highest education level: Not on file  Occupational History  . Occupation: retired    Comment: Actor in 2005  Social Needs  . Financial resource strain: Not on file  . Food insecurity    Worry: Not on file    Inability: Not on file  . Transportation needs    Medical: Not on file    Non-medical: Not on file  Tobacco Use  . Smoking status: Never Smoker  . Smokeless tobacco: Never Used  Substance and Sexual Activity  . Alcohol use: Yes    Alcohol/week: 5.0 standard drinks    Types: 5 Cans of beer per week    Comment: weekends beer, 4- 6 total Miller Light  . Drug use: No  . Sexual activity: Yes    Partners: Female  Lifestyle  . Physical activity    Days per week: Not on file    Minutes per session: Not on file  . Stress: Not on file  Relationships  . Social Herbalist  on phone: Not on file    Gets together: Not on file    Attends religious service: Not on file    Active member of club or organization: Not on file    Attends meetings of clubs or organizations: Not on file    Relationship status: Not on file  . Intimate partner violence    Fear of current or ex partner: Not on file    Emotionally abused: Not on file    Physically abused: Not on file    Forced sexual activity: Not on file  Other Topics Concern  . Not on file  Social History Narrative   Always uses seat belts. Smoke alarm and carbon monoxide detector in the home. Guns in the home stored in locked cabinet.Caffeine use: Coffee 2 servings per day, moderate amount. Exercise: Moderate walking 2 - 4 miles daily, 2 - 3 days per week.    Marital status:  Married x 47 years, happily.      Children:  2 children; 1 grandchild (63).      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      Tobacco: never      Alcohol:  Weekends; beer 4-6 per weekend.      Exercise:  Walking every other day 2.5 miles three times per day.      Seatbelt:  100%; no texting      Guns: secured guns.      Living Will: completed in 2014.  FULL CODE but no prolonged measures.        ADLs: independent with ADLs; no assistant devices    Past Medical History:  Diagnosis Date  . Allergic rhinitis, cause unspecified   . Basal cell carcinoma of skin, site unspecified 04/28/2010   Nasal; [name].  [name] Kitchen BPH (benign prostatic hypertrophy)   . Essential hypertension, benign   . Hemorrhoids, internal   . Incomplete bladder emptying   . Other abnormal glucose   . Over weight   . Personal history of colonic polyps   . Personal history of other genital system and obstetric disorders(V13.29)   . Pure hypercholesterolemia   . Unspecified disorder of kidney and ureter   . Unspecified disorder of liver      Patient Active Problem List   Diagnosis Date Noted  . Prediabetes 02/03/2017  . Obesity 02/03/2017  . Full  thickness rotator cuff tear 04/08/2016  . Impingement syndrome of shoulder region 04/08/2016  . 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 Personal history of other malignant neoplasm of skin 12/11/2014  . Essential (primary) hypertension 11/29/2012  . Pure hypercholesterolemia 11/29/2012    Past Surgical History:  Procedure Laterality Date  . BASAL CELL CARCINOMA EXCISION  2012   Facial        [place]  . COLONOSCOPY  11/13/2010   single polyp; repeatin 5 years; [place]  . COLONOSCOPY WITH PROPOFOL N/A 12/13/2015   Procedure: COLONOSCOPY WITH PROPOFOL;  Surgeon: [name], MD;  Location: [facility] ENDOSCOPY;  Service: Endoscopy;  Laterality: N/A;  . ear surgery  05/2012   [name].  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  Benign.  [name] Kitchen [place]  . LAPAROSCOPIC CHOLECYSTECTOMY  1982  . rotator cuff surgery Right 05/14/2016  . VASECTOMY  1978    His family history includes Arthritis in his mother and sister; Cancer in his father; Diabetes in his father and mother; Heart disease in his mother; Hyperlipidemia in his mother, sister, sister, and sister; Hypertension in his sister. There is no history of Kidney disease, Prostate cancer, or Colon cancer.   Current Outpatient Medications:  .  aspirin EC 81 MG tablet, Take 81 mg by mouth daily., Disp: , Rfl:  .  Garlic 10 MG CAPS, Take 10 mg by mouth daily. , Disp: , Rfl:  .  ibuprofen (ADVIL,MOTRIN) 200 MG tablet, Take 400 mg by mouth every 8 (eight) hours as needed (for pain.)., Disp: , Rfl:  .  lisinopril (PRINIVIL,ZESTRIL) 20 MG tablet, Take 1 tablet (20 mg total) by mouth daily. TAKE ONE TABLET BY MOUTH ONCE DAILY, Disp: 90 tablet, Rfl: 3 .  Multiple Vitamins-Minerals (MULTIVITAMIN PO), Take by mouth daily., Disp: , Rfl:  .  Omega-3 Fatty Acids (FISH OIL CONCENTRATE) 1000 MG CAPS, Take 1,000 mg by mouth daily. , Disp: , Rfl:  .  simvastatin (ZOCOR) 20 MG tablet, Take 1 tablet (20 mg total) by mouth at  bedtime., Disp: 90 tablet, Rfl: 3 .  vitamin E 400 UNIT capsule, Take 400 Units by mouth daily., Disp: , Rfl:   Patient Care Team: Virginia Crews, MD as PCP - General (Family Medicine)     Objective:    Vitals: BP (!) 144/90 (BP Location: Left Arm, Patient Position: Sitting, Cuff Size: Large)   Pulse 68   Temp (!) 96.9 F (36.1  C) (Temporal)   Wt 223 lb 6.4 oz (101.3 kg)   SpO2 97%   BMI 30.30 kg/m   Physical Exam Vitals signs reviewed.  Constitutional:      General: He is not in acute distress.    Appearance: Normal appearance. He is well-developed. He is not diaphoretic.  HENT:     Head: Normocephalic and atraumatic.     Right Ear: Tympanic membrane, ear canal and external ear normal.     Left Ear: Tympanic membrane, ear canal and external ear normal.  Eyes:     General: No scleral icterus.    Conjunctiva/sclera: Conjunctivae normal.     Pupils: Pupils are equal, round, and reactive to light.  Neck:     Musculoskeletal: Neck supple.     Thyroid: No thyromegaly.  Cardiovascular:     Rate and Rhythm: Normal rate and regular rhythm.     Pulses: Normal pulses.     Heart sounds: Normal heart sounds. No murmur.  Pulmonary:     Effort: Pulmonary effort is normal. No respiratory distress.     Breath sounds: Normal breath sounds. No wheezing or rales.  Abdominal:     General: There is no distension.     Palpations: Abdomen is soft.     Tenderness: There is no abdominal tenderness.  Musculoskeletal:        General: No deformity.     Right lower leg: No edema.     Left lower leg: No edema.  Lymphadenopathy:     Cervical: No cervical adenopathy.  Skin:    General: Skin is warm and dry.     Capillary Refill: Capillary refill takes less than 2 seconds.     Findings: No rash.  Neurological:     Mental Status: He is alert and oriented to person, place, and time. Mental status is at baseline.     Gait: Gait normal.  Psychiatric:        Mood and Affect: Mood normal.         Behavior: Behavior normal.        Thought Content: Thought content normal.     Activities of Daily Living In your present state of health, do you have any difficulty performing the following activities: 02/10/2019  Hearing? N  Vision? N  Difficulty concentrating or making decisions? N  Walking or climbing stairs? N  Dressing or bathing? N  Doing errands, shopping? N  Some recent data might be hidden    Fall Risk Assessment Fall Risk  02/10/2019 02/04/2018 02/03/2017 12/11/2015 06/05/2015  Falls in the past year? 0 No No No No  Number falls in past yr: 0 - - - -  Injury with Fall? 0 - - - -     Depression Screen PHQ 2/9 Scores 02/10/2019 02/04/2018 02/03/2017 12/11/2015  PHQ - 2 Score 0 0 0 0  PHQ- 9 Score 0 0 - -    No flowsheet data found.     Assessment & Plan:    Annual Physical Reviewed patient's Family Medical History Reviewed and updated list of patient's medical providers Assessment of cognitive impairment was done Assessed patient's functional ability Established a written schedule for health screening [place] Completed and Reviewed  Exercise Activities and Dietary recommendations Goals   None     Immunization History  Administered Date(s) Administered  . Influenza, High Dose Seasonal PF 01/29/2017, 12/24/2017, 12/10/2018  . Influenza-Unspecified 01/28/2011, 02/02/2014, 01/28/2015  . Pneumococcal Conjugate-13 12/05/2013  . Pneumococcal Polysaccharide-23 06/05/2015  .  Tdap 05/03/2008  . Zoster 03/12/2011  . Zoster Recombinat (Shingrix) 04/10/2018, 07/08/2018    Health Maintenance  Topic Date Due  . TETANUS/TDAP  05/03/2018  . COLONOSCOPY  12/12/2025  . INFLUENZA VACCINE  Completed  . Hepatitis C Screening  Completed  . PNA vac Low Risk Adult  Completed     Discussed health benefits of physical activity, and encouraged him to engage in regular exercise appropriate for his age and condition.     ------------------------------------------------------------------------------------------------------------  Problem List Items Addressed This Visit      Cardiovascular and Mediastinum   Essential (primary) hypertension    Uncontrolled Increase lisinopril to 40mg  daily Recheck metabolic panel      Relevant Medications   lisinopril (ZESTRIL) 40 MG tablet   Other Relevant Orders   Comprehensive metabolic panel     Genitourinary   Enlarged prostate with lower urinary tract symptoms (LUTS)    Followed by urology Not on medications Recheck PSA      Relevant Orders   PSA Total (Reflex To Free)     Other   Pure hypercholesterolemia    Previously well controlled Continue simvastatin Recheck FLP and CMP      Relevant Medications   lisinopril (ZESTRIL) 40 MG tablet   Other Relevant Orders   Comprehensive metabolic panel   Lipid panel   Prediabetes    Recheck A1c Encouraged low carb diet      Relevant Orders   Hemoglobin A1c   Obesity    Discussed importance of healthy weight management Discussed diet and exercise       Relevant Orders   Comprehensive metabolic panel   Lipid panel   CBC With Differential    Other Visit Diagnoses    Encounter for annual physical exam    -  Primary   Relevant Orders   PSA Total (Reflex To Free)   Comprehensive metabolic panel   Lipid panel   CBC With Differential   Hemoglobin A1c   Screening for prostate cancer       Relevant Orders   PSA Total (Reflex To Free)   Need for Td vaccine       Relevant Orders   Td vaccine greater than or equal to 7yo preservative free IM (Completed)       Return in about 6 months (around 08/11/2019) for chronic disease f/u.   The entirety of the information documented in the History of Present Illness, Review of Systems and Physical Exam were personally obtained by me. Portions of this information were initially documented by [facility], CMA and reviewed by me for thoroughness and  accuracy.    Keeleigh Terris, Dionne Bucy, MD MPH [facility]

## 2019-02-10 NOTE — Assessment & Plan Note (Signed)
Discussed importance of healthy weight management Discussed diet and exercise  

## 2019-02-10 NOTE — Assessment & Plan Note (Signed)
Recheck A1c Encouraged low carb diet

## 2019-02-10 NOTE — Assessment & Plan Note (Signed)
Previously well controlled Continue simvastatin Recheck FLP and CMP

## 2019-02-10 NOTE — Patient Instructions (Signed)
Preventive Care 71 Years and Older, Male Preventive care refers to lifestyle choices and visits with your health care provider that can promote health and wellness. This includes:  A yearly physical exam. This is also called an annual well check.  Regular dental and eye exams.  Immunizations.  Screening for certain conditions.  Healthy lifestyle choices, such as diet and exercise. What can I expect for my preventive care visit? Physical exam Your health care provider will check:  Height and weight. These may be used to calculate body mass index (BMI), which is a measurement that tells if you are at a healthy weight.  Heart rate and blood pressure.  Your skin for abnormal spots. Counseling Your health care provider may ask you questions about:  Alcohol, tobacco, and drug use.  Emotional well-being.  Home and relationship well-being.  Sexual activity.  Eating habits.  History of falls.  Memory and ability to understand (cognition).  Work and work Statistician. What immunizations do I need?  Influenza (flu) vaccine  This is recommended every year. Tetanus, diphtheria, and pertussis (Tdap) vaccine  You may need a Td booster every 10 years. Varicella (chickenpox) vaccine  You may need this vaccine if you have not already been vaccinated. Zoster (shingles) vaccine  You may need this after age 50. Pneumococcal conjugate (PCV13) vaccine  One dose is recommended after age 24. Pneumococcal polysaccharide (PPSV23) vaccine  One dose is recommended after age 33. Measles, mumps, and rubella (MMR) vaccine  You may need at least one dose of MMR if you were born in 1957 or later. You may also need a second dose. Meningococcal conjugate (MenACWY) vaccine  You may need this if you have certain conditions. Hepatitis A vaccine  You may need this if you have certain conditions or if you travel or work in places where you may be exposed to hepatitis A. Hepatitis B vaccine   You may need this if you have certain conditions or if you travel or work in places where you may be exposed to hepatitis B. Haemophilus influenzae type b (Hib) vaccine  You may need this if you have certain conditions. You may receive vaccines as individual doses or as more than one vaccine together in one shot (combination vaccines). Talk with your health care provider about the risks and benefits of combination vaccines. What tests do I need? Blood tests  Lipid and cholesterol levels. These may be checked every 5 years, or more frequently depending on your overall health.  Hepatitis C test.  Hepatitis B test. Screening  Lung cancer screening. You may have this screening every year starting at age 74 if you have a 30-pack-year history of smoking and currently smoke or have quit within the past 15 years.  Colorectal cancer screening. All adults should have this screening starting at age 57 and continuing until age 54. Your health care provider may recommend screening at age 47 if you are at increased risk. You will have tests every 1-10 years, depending on your results and the type of screening test.  Prostate cancer screening. Recommendations will vary depending on your family history and other risks.  Diabetes screening. This is done by checking your blood sugar (glucose) after you have not eaten for a while (fasting). You may have this done every 1-3 years.  Abdominal aortic aneurysm (AAA) screening. You may need this if you are a current or former smoker.  Sexually transmitted disease (STD) testing. Follow these instructions at home: Eating and drinking  Eat  a diet that includes fresh fruits and vegetables, whole grains, lean protein, and low-fat dairy products. Limit your intake of foods with high amounts of sugar, saturated fats, and salt.  Take vitamin and mineral supplements as recommended by your health care provider.  Do not drink alcohol if your health care provider  tells you not to drink.  If you drink alcohol: ? Limit how much you have to 0-2 drinks a day. ? Be aware of how much alcohol is in your drink. In the [place] one drink equals one 12 oz bottle of beer (355 mL), one 5 oz glass of wine (148 mL), or one 1 oz glass of hard liquor (44 mL). Lifestyle  Take daily care of your teeth and gums.  Stay active. Exercise for at least 30 minutes on 5 or more days each week.  Do not use any products that contain nicotine or tobacco, such as cigarettes, e-cigarettes, and chewing tobacco. If you need help quitting, ask your health care provider.  If you are sexually active, practice safe sex. Use a condom or other form of protection to prevent STIs (sexually transmitted infections).  Talk with your health care provider about taking a low-dose aspirin or statin. What's next?  Visit your health care provider once a year for a well check visit.  Ask your health care provider how often you should have your eyes and teeth checked.  Stay up to date on all vaccines. This information is not intended to replace advice given to you by your health care provider. Make sure you discuss any questions you have with your health care provider. Document Released: 05/11/2015 Document Revised: 04/08/2018 Document Reviewed: 04/08/2018 Elsevier Patient Education  2020 Elsevier Inc.  

## 2019-02-10 NOTE — Assessment & Plan Note (Signed)
Uncontrolled Increase lisinopril to 40mg  daily Recheck metabolic panel

## 2019-02-10 NOTE — Assessment & Plan Note (Signed)
Followed by urology Not on medications Recheck PSA

## 2019-02-11 ENCOUNTER — Telehealth: Payer: Self-pay

## 2019-02-11 LAB — COMPREHENSIVE METABOLIC PANEL
ALT: 34 IU/L (ref 0–44)
AST: 28 IU/L (ref 0–40)
Albumin/Globulin Ratio: 1.7 (ref 1.2–2.2)
Albumin: 4.7 g/dL (ref 3.7–4.7)
Alkaline Phosphatase: 43 IU/L (ref 39–117)
BUN/Creatinine Ratio: 11 (ref 10–24)
BUN: 13 mg/dL (ref 8–27)
Bilirubin Total: 1.5 mg/dL — ABNORMAL HIGH (ref 0.0–1.2)
CO2: 22 mmol/L (ref 20–29)
Calcium: 9.7 mg/dL (ref 8.6–10.2)
Chloride: 104 mmol/L (ref 96–106)
Creatinine, Ser: 1.23 mg/dL (ref 0.76–1.27)
GFR calc Af Amer: 68 mL/min/{1.73_m2} (ref >59)
GFR calc non Af Amer: 59 mL/min/{1.73_m2} — ABNORMAL LOW (ref >59)
Globulin, Total: 2.8 g/dL (ref 1.5–4.5)
Glucose: 109 mg/dL — ABNORMAL HIGH (ref 65–99)
Potassium: 4.7 mmol/L (ref 3.5–5.2)
Sodium: 140 mmol/L (ref 134–144)
Total Protein: 7.5 g/dL (ref 6.0–8.5)

## 2019-02-11 LAB — LIPID PANEL
Chol/HDL Ratio: 3.8 ratio (ref 0.0–5.0)
Cholesterol, Total: 176 mg/dL (ref 100–199)
HDL: 46 mg/dL (ref >39)
LDL Chol Calc (NIH): 101 mg/dL — ABNORMAL HIGH (ref 0–99)
Triglycerides: 169 mg/dL — ABNORMAL HIGH (ref 0–149)
VLDL Cholesterol Cal: 29 mg/dL (ref 5–40)

## 2019-02-11 LAB — CBC WITH DIFFERENTIAL
Basophils Absolute: 0.1 10*3/uL (ref 0.0–0.2)
Basos: 1 %
EOS (ABSOLUTE): 0.1 10*3/uL (ref 0.0–0.4)
Eos: 1 %
Hematocrit: 46.6 % (ref 37.5–51.0)
Hemoglobin: 15.9 g/dL (ref 13.0–17.7)
Immature Grans (Abs): 0 10*3/uL (ref 0.0–0.1)
Immature Granulocytes: 0 %
Lymphocytes Absolute: 2.4 10*3/uL (ref 0.7–3.1)
Lymphs: 32 %
MCH: 32.3 pg (ref 26.6–33.0)
MCHC: 34.1 g/dL (ref 31.5–35.7)
MCV: 95 fL (ref 79–97)
Monocytes Absolute: 0.6 10*3/uL (ref 0.1–0.9)
Monocytes: 8 %
Neutrophils Absolute: 4.4 10*3/uL (ref 1.4–7.0)
Neutrophils: 58 %
RBC: 4.92 x10E6/uL (ref 4.14–5.80)
RDW: 12.3 % (ref 11.6–15.4)
WBC: 7.6 10*3/uL (ref 3.4–10.8)

## 2019-02-11 LAB — HEMOGLOBIN A1C
Est. average glucose Bld gHb Est-mCnc: 111 mg/dL
Hgb A1c MFr Bld: 5.5 % (ref 4.8–5.6)

## 2019-02-11 LAB — PSA TOTAL (REFLEX TO FREE): Prostate Specific Ag, Serum: 0.4 ng/mL (ref 0.0–4.0)

## 2019-02-11 NOTE — Telephone Encounter (Signed)
-----   Message from Virginia Crews, MD sent at 02/11/2019  3:15 PM EDT ----- Normal/stable labs.  No changes to current medications

## 2019-02-11 NOTE — Telephone Encounter (Signed)
Patient was advised.  

## 2019-02-14 ENCOUNTER — Telehealth: Payer: Self-pay | Admitting: Family Medicine

## 2019-02-14 NOTE — Telephone Encounter (Signed)
Pt wants to pick up a copy of his last labs.    CB# [phone]  teri

## 2019-02-14 NOTE — Telephone Encounter (Signed)
Left message advising pt.   Thanks,   -Laura  

## 2019-03-16 ENCOUNTER — Other Ambulatory Visit: Payer: Self-pay | Admitting: Family Medicine

## 2019-03-16 DIAGNOSIS — E7800 Pure hypercholesterolemia, unspecified: Secondary | ICD-10-CM

## 2019-03-17 ENCOUNTER — Other Ambulatory Visit: Payer: Self-pay | Admitting: Family Medicine

## 2019-03-17 DIAGNOSIS — E7800 Pure hypercholesterolemia, unspecified: Secondary | ICD-10-CM

## 2019-03-17 MED ORDER — SIMVASTATIN 20 MG PO TABS: 20.0000 mg | ORAL_TABLET | Freq: Every day | ORAL | 3 refills | Status: DC

## 2019-03-17 NOTE — Telephone Encounter (Signed)
[facility] faxed refill request for the following medications:  simvastatin (ZOCOR) 20 MG tablet    Please advise.  Thanks, [employer]

## 2019-05-20 ENCOUNTER — Ambulatory Visit: Payer: Federal, State, Local not specified - PPO | Attending: Internal Medicine

## 2019-05-20 DIAGNOSIS — Z23 Encounter for immunization: Secondary | ICD-10-CM

## 2019-05-20 NOTE — Progress Notes (Signed)
   Covid-19 Vaccination Clinic  Name:  Ruben Garcia    MRN: [id number] DOB: [birth year]  05/20/2019  Ruben Garcia was observed post Covid-19 immunization for 15 minutes without incidence. He was provided with Vaccine Information Sheet and instruction to access the V-Safe system.   Ruben Garcia was instructed to call 911 with any severe reactions post vaccine: [name] Kitchen Difficulty breathing  . Swelling of your face and throat  . A fast heartbeat  . A bad rash all over your body  . Dizziness and weakness    Immunizations Administered    Name Date Dose VIS Date Route   Pfizer COVID-19 Vaccine 05/20/2019  1:09 PM 0.3 mL 04/08/2019 Intramuscular   Manufacturer: [place]   Lot: [id number]   [place]: [id number]

## 2019-06-10 ENCOUNTER — Ambulatory Visit: Payer: Federal, State, Local not specified - PPO | Attending: Internal Medicine

## 2019-06-10 DIAGNOSIS — Z23 Encounter for immunization: Secondary | ICD-10-CM

## 2019-06-10 NOTE — Progress Notes (Signed)
   Covid-19 Vaccination Clinic  Name:  Ruben Garcia    MRN: [id number] DOB: [birth year]  06/10/2019  [name] was observed post Covid-19 immunization for 15 minutes without incidence. He was provided with Vaccine Information Sheet and instruction to access the V-Safe system.   [name] was instructed to call 911 with any severe reactions post vaccine: [name] Kitchen Difficulty breathing  . Swelling of your face and throat  . A fast heartbeat  . A bad rash all over your body  . Dizziness and weakness    Immunizations Administered    Name Date Dose VIS Date Route   Pfizer COVID-19 Vaccine 06/10/2019  8:38 AM 0.3 mL 04/08/2019 Intramuscular   Manufacturer: [place]   Lot: [id number]   [place]: [id number]

## 2019-06-27 ENCOUNTER — Telehealth: Payer: Self-pay

## 2019-06-27 NOTE — Telephone Encounter (Signed)
Dr. Arnoldo Morale from the [facility] in [place], called requesting last lab results and immunization records. Fax # [phone]. Please advise.

## 2019-06-27 NOTE — Telephone Encounter (Signed)
I think the patient will need to sign a release to allow [place] to fax these records, but as long as he does that I am okay with sending the information

## 2019-07-05 NOTE — Telephone Encounter (Signed)
Patient advised as below.  

## 2019-08-11 ENCOUNTER — Encounter: Payer: Self-pay | Admitting: Family Medicine

## 2019-08-11 ENCOUNTER — Ambulatory Visit (INDEPENDENT_AMBULATORY_CARE_PROVIDER_SITE_OTHER): Payer: Federal, State, Local not specified - PPO | Admitting: Family Medicine

## 2019-08-11 ENCOUNTER — Other Ambulatory Visit: Payer: Self-pay

## 2019-08-11 VITALS — BP 108/77 | HR 72 | Temp 96.9°F | Resp 16 | Ht 72.0 in | Wt 220.0 lb

## 2019-08-11 DIAGNOSIS — I10 Essential (primary) hypertension: Secondary | ICD-10-CM

## 2019-08-11 DIAGNOSIS — E663 Overweight: Secondary | ICD-10-CM | POA: Diagnosis not present

## 2019-08-11 DIAGNOSIS — E7800 Pure hypercholesterolemia, unspecified: Secondary | ICD-10-CM | POA: Diagnosis not present

## 2019-08-11 DIAGNOSIS — R7303 Prediabetes: Secondary | ICD-10-CM | POA: Diagnosis not present

## 2019-08-11 NOTE — Assessment & Plan Note (Signed)
Discussed importance of healthy weight management Discussed diet and exercise  

## 2019-08-11 NOTE — Assessment & Plan Note (Signed)
Well controlled  last A1c was 5.5 Will check A1c Continue healthy lifestyle changes

## 2019-08-11 NOTE — Assessment & Plan Note (Signed)
Stable Continue lisinopril 40 mg daily Recheck CMP

## 2019-08-11 NOTE — Assessment & Plan Note (Signed)
Well controlled Continue simvastatin Recheck CMP

## 2019-08-11 NOTE — Progress Notes (Signed)
Established patient visit    Patient: Ruben Garcia   DOB: [birth year]   74 y.o. Male  MRN: [id number] Visit Date: 08/11/2019  I,Sulibeya S Dimas,acting as a scribe for Lavon Paganini, MD.,have documented all relevant documentation on the behalf of Lavon Paganini, MD,as directed by  Lavon Paganini, MD while in the presence of Lavon Paganini, MD.  Today's healthcare provider: Lavon Paganini, MD  Subjective:    Chief Complaint  Patient presents with  . Hyperglycemia  . Hypertension  . Hyperlipidemia   HPI  Prediabetes, Follow-up  Lab Results  Component Value Date   HGBA1C 5.5 02/10/2019   HGBA1C 5.4 02/04/2018   HGBA1C 5.2 08/04/2017   GLUCOSE 109 (H) 02/10/2019   GLUCOSE 108 (H) 02/11/2018   GLUCOSE 101 (H) 02/04/2018    Last seen for for this6 months ago.  Management since that visit includes checked A1c, encouraged low carb diet. Current symptoms include none and have been stable.  Prior visit with dietician: no Current diet: in general, a "healthy" diet   Current exercise: gardening, walking and yard work  Pertinent Labs:    Component Value Date/Time   CHOL 176 02/10/2019 0949   TRIG 169 (H) 02/10/2019 0949   CHOLHDL 3.8 02/10/2019 0949   CHOLHDL 3.3 02/03/2017 0952   CREATININE 1.23 02/10/2019 0949   CREATININE 1.11 02/03/2017 0952    Wt Readings from Last 3 Encounters:  08/11/19 220 lb (99.8 kg)  02/10/19 223 lb 6.4 oz (101.3 kg)  02/04/18 226 lb (102.5 kg)  -------------------------------------------------------------------------------------------------------------  Hypertension, follow-up  BP Readings from Last 3 Encounters:  08/11/19 108/77  02/10/19 (!) 144/90  08/05/18 121/86   Wt Readings from Last 3 Encounters:  08/11/19 220 lb (99.8 kg)  02/10/19 223 lb 6.4 oz (101.3 kg)  02/04/18 226 lb (102.5 kg)   He was last seen for hypertension 6 months ago.  BP at that visit was 144/90. Management since that visit includes  uncontrolled, increase lisinopril to 40 mg daily. He reports excellent compliance with treatment. He is not having side effects.  He is exercising. He is not adherent to low salt diet.   Outside blood pressures are stable. Symptoms: No chest pain No chest pressure/discomfort No dyspnea (difficulty breathing) No lower extremity edema No orthopnea No palpitations No paroxysmal nocturnal dyspnea  No syncope  He does not smoke. He is following a Regular diet. Use of agents associated with hypertension: none.   Last basic metabolic panel Lab Results  Component Value Date   GLUCOSE 109 (H) 02/10/2019   NA 140 02/10/2019   K 4.7 02/10/2019   CL 104 02/10/2019   CO2 22 02/10/2019   BUN 13 02/10/2019   CREATININE 1.23 02/10/2019   GFRNONAA 59 (L) 02/10/2019   GFRAA 68 02/10/2019   CALCIUM 9.7 02/10/2019   Last lipids Lab Results  Component Value Date   CHOL 176 02/10/2019   HDL 46 02/10/2019   LDLCALC 101 (H) 02/10/2019   TRIG 169 (H) 02/10/2019   CHOLHDL 3.8 02/10/2019    The 10-year ASCVD risk score Mikey Bussing DC Jr., et al., 2013) is: 16.5%   ----------------------------------------------------------------------------------------------------------------------  Lipid/Cholesterol, Follow-up  Last Lipid Panel:    Component Value Date/Time   CHOL 176 02/10/2019 0949   TRIG 169 (H) 02/10/2019 0949   HDL 46 02/10/2019 0949   CHOLHDL 3.8 02/10/2019 0949   CHOLHDL 3.3 02/03/2017 0952   LDLCALC 101 (H) 02/10/2019 0949   LDLCALC 90 02/03/2017 0952    He  was last seen for this 6 months ago.  Management since that visit includes continue simvastatin and check FLP and CMP.  He reports excellent compliance with treatment. He is not having side effects. Symptoms: No chest pain No chest pressure/discomfort No dyspnea No lower extremity edema No numbness or tingling of extremity No orthopnea No palpitations No paroxysmal nocturnal dyspnea No speech difficulty No  syncope  Current diet: in general, a "healthy" diet   Current exercise: gardening  Wt Readings from Last 3 Encounters:  08/11/19 220 lb (99.8 kg)  02/10/19 223 lb 6.4 oz (101.3 kg)  02/04/18 226 lb (102.5 kg)   The 10-year ASCVD risk score Mikey Bussing DC Jr., et al., 2013) is: 16.5%  -------------------------------------------------------------------------------------------------------------    Social History   Tobacco Use  . Smoking status: Never Smoker  . Smokeless tobacco: Never Used  Substance Use Topics  . Alcohol use: Yes    Alcohol/week: 5.0 standard drinks    Types: 5 Cans of beer per week    Comment: weekends beer, 4- 6 total Miller Light  . Drug use: No       Medications: Outpatient Medications Prior to Visit  Medication Sig  . aspirin EC 81 MG tablet Take 81 mg by mouth daily.  . Garlic 10 MG CAPS Take 10 mg by mouth daily.   [name] Kitchen ibuprofen (ADVIL,MOTRIN) 200 MG tablet Take 400 mg by mouth every 8 (eight) hours as needed (for pain.).  [name] Kitchen lisinopril (ZESTRIL) 40 MG tablet Take 1 tablet (40 mg total) by mouth daily. TAKE ONE TABLET BY MOUTH ONCE DAILY  . Multiple Vitamins-Minerals (MULTIVITAMIN PO) Take by mouth daily.  . Omega-3 Fatty Acids (FISH OIL CONCENTRATE) 1000 MG CAPS Take 1,000 mg by mouth daily.   . simvastatin (ZOCOR) 20 MG tablet Take 1 tablet (20 mg total) by mouth at bedtime.  . vitamin E 400 UNIT capsule Take 400 Units by mouth daily.   No facility-administered medications prior to visit.    Review of Systems  Constitutional: Negative.   Respiratory: Negative.   Cardiovascular: Negative.     Last metabolic panel Lab Results  Component Value Date   GLUCOSE 109 (H) 02/10/2019   NA 140 02/10/2019   K 4.7 02/10/2019   CL 104 02/10/2019   CO2 22 02/10/2019   BUN 13 02/10/2019   CREATININE 1.23 02/10/2019   GFRNONAA 59 (L) 02/10/2019   GFRAA 68 02/10/2019   CALCIUM 9.7 02/10/2019   PROT 7.5 02/10/2019   ALBUMIN 4.7 02/10/2019   LABGLOB 2.8  02/10/2019   AGRATIO 1.7 02/10/2019   BILITOT 1.5 (H) 02/10/2019   ALKPHOS 43 02/10/2019   AST 28 02/10/2019   ALT 34 02/10/2019   Last lipids Lab Results  Component Value Date   CHOL 176 02/10/2019   HDL 46 02/10/2019   LDLCALC 101 (H) 02/10/2019   TRIG 169 (H) 02/10/2019   CHOLHDL 3.8 02/10/2019   Last hemoglobin A1c Lab Results  Component Value Date   HGBA1C 5.5 02/10/2019        Objective:    BP 108/77 (BP Location: Left Arm, Patient Position: Sitting, Cuff Size: Large)   Pulse 72   Temp (!) 96.9 F (36.1 C) (Temporal)   Resp 16   Ht 6' (1.829 m)   Wt 220 lb (99.8 kg)   BMI 29.84 kg/m  BP Readings from Last 3 Encounters:  08/11/19 108/77  02/10/19 (!) 144/90  08/05/18 121/86      Physical Exam Vitals reviewed.  Constitutional:  General: He is not in acute distress.    Appearance: Normal appearance. He is not diaphoretic.  HENT:     Head: Normocephalic and atraumatic.  Eyes:     General: No scleral icterus.    Conjunctiva/sclera: Conjunctivae normal.  Cardiovascular:     Rate and Rhythm: Normal rate and regular rhythm.     Pulses: Normal pulses.     Heart sounds: Normal heart sounds. No murmur.  Pulmonary:     Effort: Pulmonary effort is normal. No respiratory distress.     Breath sounds: Normal breath sounds. No wheezing or rhonchi.  Abdominal:     General: There is no distension.     Palpations: Abdomen is soft.     Tenderness: There is no abdominal tenderness.  Musculoskeletal:     Cervical back: Neck supple.     Right lower leg: No edema.     Left lower leg: No edema.  Lymphadenopathy:     Cervical: No cervical adenopathy.  Skin:    General: Skin is warm and dry.     Findings: No rash.  Neurological:     Mental Status: He is alert and oriented to person, place, and time. Mental status is at baseline.  Psychiatric:        Mood and Affect: Mood normal.        Behavior: Behavior normal.      No results found for any visits on  08/11/19.    Assessment & Plan:       Return in about 6 months (around 02/10/2020) for CPE.    Problem List Items Addressed This Visit      Cardiovascular and Mediastinum   Essential (primary) hypertension    Stable Continue lisinopril 40 mg daily Recheck CMP      Relevant Orders   Comprehensive metabolic panel     Other   Pure hypercholesterolemia    Well controlled Continue simvastatin Recheck CMP      Relevant Orders   Lipid Panel With LDL/HDL Ratio   Prediabetes - Primary    Well controlled  last A1c was 5.5 Will check A1c Continue healthy lifestyle changes      Relevant Orders   Hemoglobin A1c   Overweight    Discussed importance of healthy weight management Discussed diet and exercise          Return in about 6 months (around 02/10/2020) for CPE.   I, Lavon Paganini, MD, have reviewed all documentation for this visit. The documentation on 08/11/19 for the exam, diagnosis, procedures, and orders are all accurate and complete.   Lavella Myren, Dionne Bucy, MD, MPH [facility]

## 2019-08-12 ENCOUNTER — Telehealth: Payer: Self-pay

## 2019-08-12 DIAGNOSIS — I10 Essential (primary) hypertension: Secondary | ICD-10-CM

## 2019-08-12 LAB — COMPREHENSIVE METABOLIC PANEL
ALT: 29 IU/L (ref 0–44)
AST: 28 IU/L (ref 0–40)
Albumin/Globulin Ratio: 1.8 (ref 1.2–2.2)
Albumin: 4.7 g/dL (ref 3.7–4.7)
Alkaline Phosphatase: 43 IU/L (ref 39–117)
BUN/Creatinine Ratio: 12 (ref 10–24)
BUN: 19 mg/dL (ref 8–27)
Bilirubin Total: 1.3 mg/dL — ABNORMAL HIGH (ref 0.0–1.2)
CO2: 22 mmol/L (ref 20–29)
Calcium: 9.7 mg/dL (ref 8.6–10.2)
Chloride: 100 mmol/L (ref 96–106)
Creatinine, Ser: 1.55 mg/dL — ABNORMAL HIGH (ref 0.76–1.27)
GFR calc Af Amer: 51 mL/min/{1.73_m2} — ABNORMAL LOW (ref >59)
GFR calc non Af Amer: 44 mL/min/{1.73_m2} — ABNORMAL LOW (ref >59)
Globulin, Total: 2.6 g/dL (ref 1.5–4.5)
Glucose: 102 mg/dL — ABNORMAL HIGH (ref 65–99)
Potassium: 5.3 mmol/L — ABNORMAL HIGH (ref 3.5–5.2)
Sodium: 138 mmol/L (ref 134–144)
Total Protein: 7.3 g/dL (ref 6.0–8.5)

## 2019-08-12 LAB — LIPID PANEL WITH LDL/HDL RATIO
Cholesterol, Total: 156 mg/dL (ref 100–199)
HDL: 43 mg/dL (ref >39)
LDL Chol Calc (NIH): 91 mg/dL (ref 0–99)
LDL/HDL Ratio: 2.1 ratio (ref 0.0–3.6)
Triglycerides: 121 mg/dL (ref 0–149)
VLDL Cholesterol Cal: 22 mg/dL (ref 5–40)

## 2019-08-12 LAB — HEMOGLOBIN A1C
Est. average glucose Bld gHb Est-mCnc: 100 mg/dL
Hgb A1c MFr Bld: 5.1 % (ref 4.8–5.6)

## 2019-08-12 NOTE — Telephone Encounter (Signed)
-----   Message from Virginia Crews, MD sent at 08/12/2019  8:21 AM EDT ----- A1c has decreased and is in the normal range.  Cholesterol is better.  Kidney function did slightly worsen and potassium is slightly high.  Make sure you are staying well-hydrated and recheck labs in 1 week -BMP

## 2019-08-12 NOTE — Telephone Encounter (Signed)
Patient advised as below. Patient verbalizes understanding and is in agreement with treatment plan.  

## 2019-08-19 ENCOUNTER — Telehealth: Payer: Self-pay

## 2019-08-19 LAB — BASIC METABOLIC PANEL
BUN/Creatinine Ratio: 12 (ref 10–24)
BUN: 16 mg/dL (ref 8–27)
CO2: 22 mmol/L (ref 20–29)
Calcium: 9.8 mg/dL (ref 8.6–10.2)
Chloride: 101 mmol/L (ref 96–106)
Creatinine, Ser: 1.3 mg/dL — ABNORMAL HIGH (ref 0.76–1.27)
GFR calc Af Amer: 63 mL/min/{1.73_m2} (ref >59)
GFR calc non Af Amer: 55 mL/min/{1.73_m2} — ABNORMAL LOW (ref >59)
Glucose: 103 mg/dL — ABNORMAL HIGH (ref 65–99)
Potassium: 4.9 mmol/L (ref 3.5–5.2)
Sodium: 138 mmol/L (ref 134–144)

## 2019-08-19 NOTE — Telephone Encounter (Signed)
Patient advised as below.  

## 2019-08-19 NOTE — Telephone Encounter (Signed)
-----   Message from Virginia Crews, MD sent at 08/19/2019  9:21 AM EDT ----- Labs improved

## 2019-09-13 ENCOUNTER — Ambulatory Visit (INDEPENDENT_AMBULATORY_CARE_PROVIDER_SITE_OTHER): Payer: Federal, State, Local not specified - PPO | Admitting: Urology

## 2019-09-13 ENCOUNTER — Encounter: Payer: Self-pay | Admitting: Urology

## 2019-09-13 ENCOUNTER — Other Ambulatory Visit: Payer: Self-pay

## 2019-09-13 VITALS — BP 154/102 | HR 73 | Ht 72.0 in | Wt 212.0 lb

## 2019-09-13 DIAGNOSIS — N138 Other obstructive and reflux uropathy: Secondary | ICD-10-CM | POA: Diagnosis not present

## 2019-09-13 DIAGNOSIS — N401 Enlarged prostate with lower urinary tract symptoms: Secondary | ICD-10-CM | POA: Diagnosis not present

## 2019-09-13 DIAGNOSIS — N529 Male erectile dysfunction, unspecified: Secondary | ICD-10-CM

## 2019-09-13 MED ORDER — SILDENAFIL CITRATE 100 MG PO TABS: 100.0000 mg | ORAL_TABLET | Freq: Every day | ORAL | 1 refills | Status: DC | PRN

## 2019-09-13 NOTE — Progress Notes (Signed)
09/13/2019 11:24 AM   Ruben Garcia [birth year] [id number]  Referring provider: Virginia Crews, [place],  [place] [address]  Chief Complaint  Patient presents with  . Benign Prostatic Hypertrophy    HPI: Patient is a 74 year old  male with a history of BPH with LUTS who presents today for his yearly exam.  BPH WITH LUTS His IPSS score today is 5, which is mild lower urinary tract symptomatology. He is pleased with his quality life due to his urinary symptoms.  He has no complaints at this visit.  His previous I PSS score was 3/1.  Patient denies any modifying or aggravating factors.  Patient denies any gross hematuria, dysuria or suprapubic/flank pain.  Patient denies any fevers, chills, nausea or vomiting.   IPSS    Row Name 09/13/19 0800         International Prostate Symptom Score   How often have you had the sensation of not emptying your bladder?  Less than 1 in 5     How often have you had to urinate less than every two hours?  Less than 1 in 5 times     How often have you found you stopped and started again several times when you urinated?  Less than 1 in 5 times     How often have you found it difficult to postpone urination?  Not at All     How often have you had a weak urinary stream?  Less than 1 in 5 times     How often have you had to strain to start urination?  Not at All     How many times did you typically get up at night to urinate?  1 Time     Total IPSS Score  5       Quality of Life due to urinary symptoms   If you were to spend the rest of your life with your urinary condition just the way it is now how would you feel about that?  Pleased        Score:  1-7 Mild 8-19 Moderate 20-35 Severe  Erectile dysfunction He denies any curvature with erections or pain with erections. He has not tried anything for erections.   PMH: Past Medical History:  Diagnosis Date  . Allergic rhinitis, cause unspecified   . Basal  cell carcinoma of skin, site unspecified 04/28/2010   Nasal; [name].  [name] Kitchen BPH (benign prostatic hypertrophy)   . Essential hypertension, benign   . Hemorrhoids, internal   . Incomplete bladder emptying   . Other abnormal glucose   . Over weight   . Personal history of colonic polyps   . Personal history of other genital system and obstetric disorders(V13.29)   . Pure hypercholesterolemia   . Unspecified disorder of kidney and ureter   . Unspecified disorder of liver     Surgical History: Past Surgical History:  Procedure Laterality Date  . BASAL CELL CARCINOMA EXCISION  2012   Facial        [place]  . COLONOSCOPY  11/13/2010   single polyp; repeatin 5 years; [place]  . COLONOSCOPY WITH PROPOFOL N/A 12/13/2015   Procedure: COLONOSCOPY WITH PROPOFOL;  Surgeon: [name], MD;  Location: [facility] END[facility]COPY;  Service: Endoscopy;  Laterality: N/A;  . ear surgery  05/2012   [name].  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  Benign.  [name] Kitchen [place]  . LAPAROSCOPIC CHOLECYSTECTOMY  1982  .  rotator cuff surgery Right 05/14/2016  . VASECTOMY  1978    Home Medications:  Allergies as of 09/13/2019      Reactions   Codeine Nausea Only      Medication List       Accurate as of Sep 13, 2019 11:24 AM. If you have any questions, ask your nurse or doctor.        aspirin EC 81 MG tablet Take 81 mg by mouth daily.   Fish Oil Concentrate 1000 MG Caps Take 1,000 mg by mouth daily.   Garlic 10 MG Caps Take 10 mg by mouth daily.   ibuprofen 200 MG tablet Commonly known as: ADVIL Take 400 mg by mouth every 8 (eight) hours as needed (for pain.).   lisinopril 40 MG tablet Commonly known as: ZESTRIL Take 1 tablet (40 mg total) by mouth daily. TAKE ONE TABLET BY MOUTH ONCE DAILY   MULTIVITAMIN PO Take by mouth daily.   sildenafil 100 MG tablet Commonly known as: VIAGRA Take 1 tablet (100 mg total) by mouth daily as needed for erectile dysfunction. Take two hours prior to  intercourse on an empty stomach Started by: Zara Council, PA-C   simvastatin 20 MG tablet Commonly known as: ZOCOR Take 1 tablet (20 mg total) by mouth at bedtime.   vitamin E 180 MG (400 UNITS) capsule Take 400 Units by mouth daily.       Allergies:  Allergies  Allergen Reactions  . Codeine Nausea Only    Family History: Family History  Problem Relation Age of Onset  . Heart disease Mother        first MI in 31s  . Diabetes Mother   . Hyperlipidemia Mother   . Arthritis Mother        rheumatoid  . Cancer Father        lung  . Diabetes Father   . Arthritis Sister   . Hyperlipidemia Sister   . Hyperlipidemia Sister   . Hypertension Sister   . Hyperlipidemia Sister   . Kidney disease Neg Hx   . Prostate cancer Neg Hx   . Colon cancer Neg Hx     Social History:  reports that he has never smoked. He has never used smokeless tobacco. He reports current alcohol use of about 5.0 standard drinks of alcohol per week. He reports that he does not use drugs.  ROS: For pertinent review of systems please refer to history of present illness  Physical Exam: BP (!) 154/102   Pulse 73   Ht 6' (1.829 m)   Wt 212 lb (96.2 kg)   BMI 28.75 kg/m   Constitutional:  Well nourished. Alert and oriented, No acute distress. HEENT: [place] AT, mask in  place.  Trachea midline Cardiovascular: No clubbing, cyanosis, or edema. Respiratory: Normal respiratory effort, no increased work of breathing. GI: Abdomen is soft, non tender, non distended, no abdominal masses.  GU: No CVA tenderness.  No bladder fullness or masses.  Patient with uncircumcised phallus.  Foreskin easily retracted.  Mild balanitis.  Urethral meatus is patent.  No penile discharge. No penile lesions or rashes. Scrotum without lesions, cysts, rashes and/or edema.  Testicles are located scrotally bilaterally. No masses are appreciated in the testicles. Left and right epididymis are normal. Rectal: Patient with  normal  sphincter tone. Anus and perineum without scarring or rashes. No rectal masses are appreciated. Prostate is approximately 55 grams, no nodules are appreciated. Seminal vesicles are normal. Skin: No rashes, bruises or  suspicious lesions. Lymph: No inguinal adenopathy. Neurologic: Grossly intact, no focal deficits, moving all 4 extremities. Psychiatric: Normal mood and affect.  Laboratory Data: Lab Results  Component Value Date   WBC 7.6 02/10/2019   HGB 15.9 02/10/2019   HCT 46.6 02/10/2019   MCV 95 02/10/2019   PLT 267 02/04/2018    Lab Results  Component Value Date   CREATININE 1.30 (H) 08/18/2019    Lab Results  Component Value Date   PSA 0.3 12/08/2013  PSA 0.4 in 07/2017  Lab Results  Component Value Date   HGBA1C 5.1 08/11/2019       Component Value Date/Time   CHOL 156 08/11/2019 0908   HDL 43 08/11/2019 0908   CHOLHDL 3.8 02/10/2019 0949   CHOLHDL 3.3 02/03/2017 0952   LDLCALC 91 08/11/2019 0908   LDLCALC 90 02/03/2017 0952    Lab Results  Component Value Date   AST 28 08/11/2019   Lab Results  Component Value Date   ALT 29 08/11/2019   I have reviewed the labs  Assessment & Plan:    1. BPH with LUTS - IPSS score is 5/1, it is worsening - Continue conservative management, avoiding bladder irritants and timed voiding's - RTC in 12 months for IPSS, PSA and exam   2. Erectile dysfunction Prescribe sildenafil 100 mg, take 1 to 2 hours prior to intercourse on empty stomach #90 to [place].  Advised patient not to take with nitrates. Return to clinic in 1 year for [facility] IM score and exam  Return in about 1 year (around 09/12/2020) for IPSS, SHIM, PSA and exam.  These notes generated with voice recognition software. I apologize for typographical errors.  Zara Council, PA-C  [facility] [address] [place], [place] [address] [phone]

## 2019-09-14 LAB — MICROSCOPIC EXAMINATION
Bacteria, UA: NONE SEEN
Epithelial Cells (non renal): NONE SEEN /hpf (ref 0–10)
RBC, Urine: NONE SEEN /hpf (ref 0–2)

## 2019-09-14 LAB — URINALYSIS, COMPLETE
Bilirubin, UA: NEGATIVE
Glucose, UA: NEGATIVE
Ketones, UA: NEGATIVE
Leukocytes,UA: NEGATIVE
Nitrite, UA: NEGATIVE
Protein,UA: NEGATIVE
RBC, UA: NEGATIVE
Specific Gravity, UA: 1.015 (ref 1.005–1.030)
Urobilinogen, Ur: 0.2 mg/dL (ref 0.2–1.0)
pH, UA: 5.5 (ref 5.0–7.5)

## 2019-11-28 ENCOUNTER — Emergency Department
Admission: EM | Admit: 2019-11-28 | Discharge: 2019-11-28 | Disposition: A | Discharge status: Home / Self Care | Payer: Federal, State, Local not specified - PPO | Attending: Emergency Medicine | Admitting: Emergency Medicine

## 2019-11-28 ENCOUNTER — Other Ambulatory Visit: Payer: Self-pay

## 2019-11-28 ENCOUNTER — Ambulatory Visit: Payer: Self-pay | Admitting: *Deleted

## 2019-11-28 ENCOUNTER — Encounter: Payer: Self-pay | Admitting: Emergency Medicine

## 2019-11-28 DIAGNOSIS — Z203 Contact with and (suspected) exposure to rabies: Secondary | ICD-10-CM

## 2019-11-28 DIAGNOSIS — Z2914 Encounter for prophylactic rabies immune globin: Secondary | ICD-10-CM | POA: Diagnosis not present

## 2019-11-28 DIAGNOSIS — I10 Essential (primary) hypertension: Secondary | ICD-10-CM | POA: Insufficient documentation

## 2019-11-28 DIAGNOSIS — Z7982 Long term (current) use of aspirin: Secondary | ICD-10-CM | POA: Diagnosis not present

## 2019-11-28 MED ORDER — RABIES IMMUNE GLOBULIN 150 UNIT/ML IM INJ
20.0000 [IU]/kg | INJECTION | Freq: Once | INTRAMUSCULAR | Status: AC
  Administered 2019-11-28: 1950 [IU] via INTRAMUSCULAR
  Filled 2019-11-28: qty 13

## 2019-11-28 MED ORDER — RABIES VACCINE, PCEC IM SUSR
1.0000 mL | Freq: Once | INTRAMUSCULAR | Status: AC
  Administered 2019-11-28: 1 mL via INTRAMUSCULAR
  Filled 2019-11-28: qty 1

## 2019-11-28 NOTE — Discharge Instructions (Signed)
Follow-up on day 3, day 7, day 14 to complete rabies series.

## 2019-11-28 NOTE — ED Provider Notes (Signed)
[facility] Emergency Department Provider Note   ____________________________________________   None    (approximate)  I have reviewed the triage vital signs and the nursing notes.   HISTORY  Chief Complaint Rabies Injection    HPI REFORD OLLIFF is a 74 y.o. male patient presents with back scratches to his neck secondary to animal being in a house last night.  Patient states he Did animal inputted outside.  Patient has been advised by animal control to have rabies prophylaxis injections.         Past Medical History:  Diagnosis Date  . Allergic rhinitis, cause unspecified   . Basal cell carcinoma of skin, site unspecified 04/28/2010   Nasal; [name].  [name] Kitchen BPH (benign prostatic hypertrophy)   . Essential hypertension, benign   . Hemorrhoids, internal   . Incomplete bladder emptying   . Other abnormal glucose   . Over weight   . Personal history of colonic polyps   . Personal history of other genital system and obstetric disorders(V13.29)   . Pure hypercholesterolemia   . Unspecified disorder of kidney and ureter   . Unspecified disorder of liver     Patient Active Problem List   Diagnosis Date Noted  . Prediabetes 02/03/2017  . Overweight 02/03/2017  . Full thickness rotator cuff tear 04/08/2016  . Impingement syndrome of shoulder region 04/08/2016  . 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 Personal history of other malignant neoplasm of skin 12/11/2014  . Essential (primary) hypertension 11/29/2012  . Pure hypercholesterolemia 11/29/2012    Past Surgical History:  Procedure Laterality Date  . BASAL CELL CARCINOMA EXCISION  2012   Facial        [place]  . COLONOSCOPY  11/13/2010   single polyp; repeatin 5 years; [place]  . COLONOSCOPY WITH PROPOFOL N/A 12/13/2015   Procedure: COLONOSCOPY WITH PROPOFOL;  Surgeon: [name], MD;  Location: [facility] ENDOSCOPY;  Service: Endoscopy;  Laterality: N/A;   . ear surgery  05/2012   [name].  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  Benign.  [name] Kitchen [place]  . LAPAROSCOPIC CHOLECYSTECTOMY  1982  . rotator cuff surgery Right 05/14/2016  . VASECTOMY  1978    Prior to Admission medications   Medication Sig Start Date End Date Taking? Authorizing Provider  aspirin EC 81 MG tablet Take 81 mg by mouth daily.    [provider]  Garlic 10 MG CAPS Take 10 mg by mouth daily.     [provider]  ibuprofen (ADVIL,MOTRIN) 200 MG tablet Take 400 mg by mouth every 8 (eight) hours as needed (for pain.).    [provider]  lisinopril (ZESTRIL) 40 MG tablet Take 1 tablet (40 mg total) by mouth daily. TAKE ONE TABLET BY MOUTH ONCE DAILY 02/10/19   Virginia Crews, MD  Multiple Vitamins-Minerals (MULTIVITAMIN PO) Take by mouth daily.    [provider]  Omega-3 Fatty Acids (FISH OIL CONCENTRATE) 1000 MG CAPS Take 1,000 mg by mouth daily.     [provider]  sildenafil (VIAGRA) 100 MG tablet Take 1 tablet (100 mg total) by mouth daily as needed for erectile dysfunction. Take two hours prior to intercourse on an empty stomach 09/13/19   Ernestine Conrad, Larene Beach A, PA-C  simvastatin (ZOCOR) 20 MG tablet Take 1 tablet (20 mg total) by mouth at bedtime. 03/17/19   Virginia Crews, MD  vitamin E 400 UNIT capsule Take 400 Units by  mouth daily.    [provider]    Allergies Codeine  Family History  Problem Relation Age of Onset  . Heart disease Mother        first MI in 28s  . Diabetes Mother   . Hyperlipidemia Mother   . Arthritis Mother        rheumatoid  . Cancer Father        lung  . Diabetes Father   . Arthritis Sister   . Hyperlipidemia Sister   . Hyperlipidemia Sister   . Hypertension Sister   . Hyperlipidemia Sister   . Kidney disease Neg Hx   . Prostate cancer Neg Hx   . Colon cancer Neg Hx     Social History Social History   Tobacco Use  . Smoking status: Never Smoker  .  Smokeless tobacco: Never Used  Vaping Use  . Vaping Use: Never used  Substance Use Topics  . Alcohol use: Yes    Alcohol/week: 5.0 standard drinks    Types: 5 Cans of beer per week    Comment: weekends beer, 4- 6 total Miller [employer]  . Drug use: No    Review of Systems  Constitutional: No fever/chills Eyes: No visual changes. ENT: No sore throat. Cardiovascular: Denies chest pain. Respiratory: Denies shortness of breath. Gastrointestinal: No abdominal pain.  No nausea, no vomiting.  No diarrhea.  No constipation. Genitourinary: Negative for dysuria. Musculoskeletal: Negative for back pain. Skin: Negative for rash. Neurological: Negative for headaches, focal weakness or numbness. Endocrine:  Hyperlipidemia and hypertension Allergic/Immunilogical: Codeine  ____________________________________________   PHYSICAL EXAM:  VITAL SIGNS: ED Triage Vitals  Enc Vitals Group     BP 11/28/19 0817 (!) 170/85     Pulse Rate 11/28/19 0817 73     Resp 11/28/19 0817 17     Temp 11/28/19 0817 98.2 F (36.8 C)     Temp Source 11/28/19 0817 Oral     SpO2 11/28/19 0817 98 %     Weight 11/28/19 0813 (!) 210 lb (95.3 kg)     Height 11/28/19 0813 6' (1.829 m)     Head Circumference --      Peak Flow --      Pain Score 11/28/19 0813 0     Pain Loc --      Pain Edu? --      Excl. in [place]? --    Constitutional: Alert and oriented. Well appearing and in no acute distress. Eyes: Conjunctivae are normal. PERRL. EOMI. Head: Atraumatic. Nose: No congestion/rhinnorhea. Mouth/Throat: Mucous membranes are moist.  Oropharynx non-erythematous. Neck: No stridor.   Hematological/Lymphatic/Immunilogical: No cervical lymphadenopathy. Cardiovascular: Normal rate, regular rhythm. Grossly normal heart sounds.  Good peripheral circulation.  Elevated blood pressure. Respiratory: Normal respiratory effort.  No retractions. Lungs CTAB. Skin:  Skin is warm, dry and intact. No rash noted.  Left lateral neck  abrasions. Psychiatric: Mood and affect are normal. Speech and behavior are normal.  ____________________________________________   LABS (all labs ordered are listed, but only abnormal results are displayed)  Labs Reviewed - No data to display ____________________________________________  EKG   ____________________________________________  RADIOLOGY  ED MD interpretation:    Official radiology report(s): No results found.  ____________________________________________   PROCEDURES  Procedure(s) performed (including Critical Care):  Procedures   ____________________________________________   INITIAL IMPRESSION / ASSESSMENT AND PLAN / ED COURSE  As part of my medical decision making, I reviewed the following data within the [place]  Patient presents for rabies prophylaxis secondary to close contact and scratches by bed last night.  Patient given discharge care instruction and advised to follow-up to complete rabies series.    HICKS FEICK was evaluated in Emergency Department on 11/28/2019 for the symptoms described in the history of present illness. He was evaluated in the context of the global COVID-19 pandemic, which necessitated consideration that the patient might be at risk for infection with the SARS-CoV-2 virus that causes COVID-19. Institutional protocols and algorithms that pertain to the evaluation of patients at risk for COVID-19 are in a state of rapid change based on information released by regulatory bodies including the CDC and federal and state organizations. These policies and algorithms were followed during the patient's care in the ED.       ____________________________________________   FINAL CLINICAL IMPRESSION(S) / ED DIAGNOSES  Final diagnoses:  Contact with and (suspected) exposure to rabies     ED Discharge Orders    None       Note:  This document was prepared using Dragon voice recognition software and  may include unintentional dictation errors.    Sable Feil, PA-C 11/28/19 1000    Lucrezia Starch, MD 11/28/19 [phone]

## 2019-11-28 NOTE — ED Triage Notes (Signed)
Pt reports there was a bat in his house last pm that he captured and put outside. Pt reports some scratches on his neck as well.

## 2019-11-28 NOTE — Telephone Encounter (Signed)
Pt called in concerned he was bitten by a bat.   2-3 days ago he found a bat in his house.   He captured it and put it outside.   His left side of his neck is swollen and there are 2 small bite marks on the left side of his neck.   The swelling is resolving today but just wanted to make sure what I should do because I think it was the bat that bit me.  I'm a very deep sleeper/  See notes below.  I have referred him to the ED in case he needs rabies treatments since he was exposed to the bat overnight.  He was agreeable to going.    Reason for Disposition . [1] No bite mark or scratch AND [2] suspected bat exposure (e.g., bat found in same room as sleeping adult)  Answer Assessment - Initial Assessment Questions 1. ANIMAL: "What type of animal caused the bite?" "Is the injury from a bite or a claw?" If the animal is a dog or a cat, ask: "Was it a pet or a stray?" "Was it acting ill or behaving strangely?"     I have an older home.  Bats have gotten in before.   He was in the house last night.   We caught him and put him outside.   I thought I had a few misquitos bites on but I'm not sure it wasn't the bite. 2. LOCATION: "Where is the bite located?"      The 2 on left side of my neck.  They have been swollen but today the swelling is going down.   It happened 2-3 days ago. 3. SIZE: "How big is the bite?" "What does it look like?"      Like a misquito bite 4. ONSET: "When did the bite happen?" (Minutes or hours ago)      2-3 days ago 5. CIRCUMSTANCES: "Tell me how this happened."      See above 6. TETANUS: "When was the last tetanus booster?"     Yes up to date 7. PREGNANCY: "Is there any chance you are pregnant?" "When was your last menstrual period?"     N/A  Protocols used: ANIMAL BITE-A-AH

## 2019-12-01 ENCOUNTER — Telehealth: Payer: Self-pay

## 2019-12-01 ENCOUNTER — Ambulatory Visit
Admission: EM | Admit: 2019-12-01 | Discharge: 2019-12-01 | Disposition: A | Discharge status: Home / Self Care | Payer: Federal, State, Local not specified - PPO | Attending: Internal Medicine | Admitting: Internal Medicine

## 2019-12-01 ENCOUNTER — Other Ambulatory Visit: Payer: Self-pay

## 2019-12-01 ENCOUNTER — Emergency Department
Admission: EM | Admit: 2019-12-01 | Discharge: 2019-12-01 | Disposition: A | Discharge status: Home / Self Care | Payer: Federal, State, Local not specified - PPO | Attending: Emergency Medicine | Admitting: Emergency Medicine

## 2019-12-01 DIAGNOSIS — Z5321 Procedure and treatment not carried out due to patient leaving prior to being seen by health care provider: Secondary | ICD-10-CM | POA: Insufficient documentation

## 2019-12-01 DIAGNOSIS — Z23 Encounter for immunization: Secondary | ICD-10-CM

## 2019-12-01 DIAGNOSIS — Z203 Contact with and (suspected) exposure to rabies: Secondary | ICD-10-CM

## 2019-12-01 MED ORDER — RABIES VACCINE, PCEC IM SUSR
1.0000 mL | Freq: Once | INTRAMUSCULAR | Status: AC
  Administered 2019-12-01: 1 mL via INTRAMUSCULAR

## 2019-12-01 NOTE — Telephone Encounter (Signed)
Pt called back to find out if he can come in for rabies vaccine. Pt requests call back asap

## 2019-12-01 NOTE — Telephone Encounter (Signed)
Copied from [place] [id number]. Topic: General - Other >> Dec 01, 2019  9:28 AM Leward Quan A wrote: Reason for CRM: Patient called to say that he had to have shots for rabies and there is no one to give him the shot want to know if he can come in to this office to receive the rabies shots. Please call with an answer at  Ph# [phone]

## 2019-12-01 NOTE — ED Triage Notes (Signed)
Pt states he is here for his second rabies injection from having bats in his home and possibly been bitten.

## 2019-12-01 NOTE — Telephone Encounter (Signed)
Patient states he found an urgent care that can give him the rest of his shots.

## 2019-12-01 NOTE — Telephone Encounter (Signed)
Patient will need to go to the ER to get these shots.  They are the only ones I know that will have the vaccine

## 2019-12-05 ENCOUNTER — Ambulatory Visit
Admission: EM | Admit: 2019-12-05 | Discharge: 2019-12-05 | Disposition: A | Discharge status: Home / Self Care | Payer: Federal, State, Local not specified - PPO | Attending: Internal Medicine | Admitting: Internal Medicine

## 2019-12-05 DIAGNOSIS — Z203 Contact with and (suspected) exposure to rabies: Secondary | ICD-10-CM

## 2019-12-05 MED ORDER — RABIES VACCINE, PCEC IM SUSR
1.0000 mL | Freq: Once | INTRAMUSCULAR | Status: AC
  Administered 2019-12-05: 1 mL via INTRAMUSCULAR

## 2019-12-12 ENCOUNTER — Ambulatory Visit
Admission: EM | Admit: 2019-12-12 | Discharge: 2019-12-12 | Disposition: A | Discharge status: Home / Self Care | Payer: Federal, State, Local not specified - PPO | Attending: Internal Medicine | Admitting: Internal Medicine

## 2019-12-12 ENCOUNTER — Other Ambulatory Visit: Payer: Self-pay

## 2019-12-12 DIAGNOSIS — Z203 Contact with and (suspected) exposure to rabies: Secondary | ICD-10-CM | POA: Diagnosis not present

## 2019-12-12 MED ORDER — RABIES VACCINE, PCEC IM SUSR
1.0000 mL | Freq: Once | INTRAMUSCULAR | Status: AC
  Administered 2019-12-12: 1 mL via INTRAMUSCULAR

## 2019-12-12 NOTE — Discharge Instructions (Signed)
Rabies Rabies is an infection that affects the brain and central nervous system. It is caused by a virus that can be carried by many kinds of animals. The virus can spread from an infected animal to a person through a bite. If you have been bitten by an animal with rabies, it is very important that you get treatment right away. Infection almost always results in death, but early treatment may prevent an infection from developing. What are the causes? This condition is caused by a virus that can be carried by many kinds of animals, including dogs, cats, skunks, bats, woodchucks, raccoons, coyotes, and foxes. The virus spreads through the saliva of infected animals. Most people who get rabies get it from an animal bite. What are the signs or symptoms? Symptoms of this condition usually start 1-3 months after you are bitten. By the time symptoms start, it is usually too late for lifesaving treatment. Symptoms may include:  Headache.  Fever.  Fatigue and weakness.  Agitation.  Anxiety.  Confusion.  Unusual behavior, such as hyperactivity, fear of water (hydrophobia), or fear of air (aerophobia).  Hallucinations.  Insomnia.  Weakness in the arms or legs.  Difficulty swallowing. Most people who are treated right away will never have symptoms. How is this diagnosed? This condition may be diagnosed based on:  Your history of exposure to animals and animal bites.  Your symptoms.  Saliva tests.  Blood tests.  Skin test. Skin samples are taken with a needle.  Spinal fluid test. Spinal fluid samples are taken with a needle that is inserted into your back (lumbar puncture). How is this treated? Treatment is often started right away, even if it is not known for sure if the animal that bit you has rabies. Treatment, called post-exposure prophylaxis (PEP), aims to prevent the infection from developing. It involves:  Cleaning the wound.  Getting an injection to strengthen your body's  defense against the rabies virus (immune globulin).  Having a series of rabies vaccine injections, usually given over a 2-week period. If the animal that bit you has been caught and is alive, it will be watched to see if it remains healthy. If the animal has been killed, it can be tested for rabies. Follow these instructions at home: Caring for your injury   Follow instructions from your health care provider about how to take care of your wound. Make sure you: ? Wash your hands with soap and water before you change your bandage (dressing). If soap and water are not available, use hand sanitizer. ? Change your dressing as told by your health care provider. ? Leave stitches (sutures), skin glue, or adhesive strips in place. These skin closures may need to stay in place for 2 weeks or longer. If adhesive strip edges start to loosen and curl up, you may trim the loose edges. Do not remove adhesive strips completely unless your health care provider tells you to do that.  Check your wound every day for signs of infection. Check for: ? More redness, swelling, or pain. ? Fluid or blood. ? Pus or a bad smell. ? Warmth.  Keep the wound dry for as long as told by your health care provider.  Keep the wound raised (elevated) above the level of your heart as much as possible.  Rest the injured area. Do not use the injured area until your health care provider says it is okay. General instructions  If the animal that bit you was tested for rabies, ask your  health care provider or the department performing the test, when the test results will be ready. It is your responsibility to get the test results.  Take over-the-counter and prescription medicines only as told by your health care provider.  Keep all follow-up visits as told by your health care provider. This is important. How is this prevented?  Stay away from stray or wild animals.  Get the rabies vaccine if you: ? Plan to travel to an area  where rabies is common. ? Have a job or hobbies that involve possible contact with wild or stray animals.  Make sure your pet stays up to date with rabies vaccinations.  Watch your pets when they are outside. Keep them away from wild animals.  Report any stray animals to the local animal control services. Get help right away if you:  Are bitten by a wild or stray animal.  Have had any direct exposure to a bat.  Have any symptoms of rabies infection.  Have signs that your wound is infected, including: ? More redness, swelling, or pain around your wound. ? Fluid or blood coming from your wound. ? Pus or a bad smell coming from your wound. ? Your wound feels warm to the touch. Summary  Rabies is an infection that affects the brain and central nervous system.  This condition is caused by a virus that can be carried by many kinds of animals, including dogs, cats, skunks, bats, woodchucks, raccoons, coyotes, and foxes.  The virus can spread from an infected animal's saliva to a person through a bite.  If you are bitten, get treatment right away. This may prevent an infection from developing. Symptoms of an infection usually do not start until 1-3 months after you are bitten. By then it may be too late for lifesaving treatment. This information is not intended to replace advice given to you by your health care provider. Make sure you discuss any questions you have with your health care provider. Document Revised: 05/20/2017 Document Reviewed: 05/20/2017 Elsevier Patient Education  2020 [employer].

## 2020-02-16 ENCOUNTER — Encounter: Payer: Self-pay | Admitting: Family Medicine

## 2020-02-16 ENCOUNTER — Other Ambulatory Visit: Payer: Self-pay

## 2020-02-16 ENCOUNTER — Ambulatory Visit (INDEPENDENT_AMBULATORY_CARE_PROVIDER_SITE_OTHER): Payer: Federal, State, Local not specified - PPO | Admitting: Family Medicine

## 2020-02-16 VITALS — BP 134/81 | HR 67 | Temp 98.0°F | Ht 72.0 in | Wt 219.0 lb

## 2020-02-16 DIAGNOSIS — Z125 Encounter for screening for malignant neoplasm of prostate: Secondary | ICD-10-CM

## 2020-02-16 DIAGNOSIS — Z0000 Encounter for general adult medical examination without abnormal findings: Secondary | ICD-10-CM | POA: Diagnosis not present

## 2020-02-16 DIAGNOSIS — N401 Enlarged prostate with lower urinary tract symptoms: Secondary | ICD-10-CM

## 2020-02-16 DIAGNOSIS — R7303 Prediabetes: Secondary | ICD-10-CM | POA: Diagnosis not present

## 2020-02-16 DIAGNOSIS — E7800 Pure hypercholesterolemia, unspecified: Secondary | ICD-10-CM | POA: Diagnosis not present

## 2020-02-16 DIAGNOSIS — E663 Overweight: Secondary | ICD-10-CM

## 2020-02-16 DIAGNOSIS — I10 Essential (primary) hypertension: Secondary | ICD-10-CM

## 2020-02-16 NOTE — Progress Notes (Signed)
Annual Wellness Visit     Patient: Ruben Garcia, Male    DOB: Dec 18, 1945, 74 y.o.   MRN: [id number] Visit Date: 02/16/2020  Today's Provider: Lavon Paganini, MD   Chief Complaint  Patient presents with  . Annual Exam   Subjective    Ruben Garcia is a 74 y.o. male who presents today for his Annual Wellness Visit. He reports consuming a general diet. Exercises regularly He generally feels well. He reports sleeping well. He does not have additional problems to discuss today.   HPI   Patient Active Problem List   Diagnosis Date Noted  . Prediabetes 02/03/2017  . Overweight 02/03/2017  . Full thickness rotator cuff tear 04/08/2016  . Impingement syndrome of shoulder region 04/08/2016  . 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 Personal history of other malignant neoplasm of skin 12/11/2014  . Essential (primary) hypertension 11/29/2012  . Pure hypercholesterolemia 11/29/2012   Past Medical History:  Diagnosis Date  . Allergic rhinitis, cause unspecified   . Basal cell carcinoma of skin, site unspecified 04/28/2010   Nasal; [name].  [name] Kitchen BPH (benign prostatic hypertrophy)   . Essential hypertension, benign   . Hemorrhoids, internal   . Incomplete bladder emptying   . Other abnormal glucose   . Over weight   . Personal history of colonic polyps   . Personal history of other genital system and obstetric disorders(V13.29)   . Pure hypercholesterolemia   . Unspecified disorder of kidney and ureter   . Unspecified disorder of liver    Social History   Tobacco Use  . Smoking status: Never Smoker  . Smokeless tobacco: Never Used  Vaping Use  . Vaping Use: Never used  Substance Use Topics  . Alcohol use: Yes    Alcohol/week: 5.0 standard drinks    Types: 5 Cans of beer per week    Comment: weekends beer, 4- 6 total Miller Light  . Drug use: No   Allergies  Allergen Reactions  . Codeine Nausea Only      Medications: Outpatient Medications Prior to Visit  Medication Sig  . aspirin EC 81 MG tablet Take 81 mg by mouth daily.  . Garlic 10 MG CAPS Take 10 mg by mouth daily.   [name] Kitchen ibuprofen (ADVIL,MOTRIN) 200 MG tablet Take 400 mg by mouth every 8 (eight) hours as needed (for pain.).  [name] Kitchen lisinopril (ZESTRIL) 40 MG tablet Take 1 tablet (40 mg total) by mouth daily. TAKE ONE TABLET BY MOUTH ONCE DAILY  . Multiple Vitamins-Minerals (MULTIVITAMIN PO) Take by mouth daily.  . Omega-3 Fatty Acids (FISH OIL CONCENTRATE) 1000 MG CAPS Take 1,000 mg by mouth daily.   . sildenafil (VIAGRA) 100 MG tablet Take 1 tablet (100 mg total) by mouth daily as needed for erectile dysfunction. Take two hours prior to intercourse on an empty stomach  . simvastatin (ZOCOR) 20 MG tablet Take 1 tablet (20 mg total) by mouth at bedtime.  . vitamin E 400 UNIT capsule Take 400 Units by mouth daily.   No facility-administered medications prior to visit.    Allergies  Allergen Reactions  . Codeine Nausea Only    Patient Care Team: Virginia Crews, MD as PCP - General (Family Medicine)  Review of Systems  Constitutional: Negative.   HENT: Negative.   Eyes: Negative.   Respiratory: Negative.   Cardiovascular: Negative.   Gastrointestinal: Negative.   Endocrine: Negative.   Genitourinary: Negative.   Musculoskeletal: Negative.  Skin: Negative.   Allergic/Immunologic: Negative.   Neurological: Negative.   Hematological: Negative.   Psychiatric/Behavioral: Negative.     Last CBC Lab Results  Component Value Date   WBC 7.6 02/10/2019   HGB 15.9 02/10/2019   HCT 46.6 02/10/2019   MCV 95 02/10/2019   MCH 32.3 02/10/2019   RDW 12.3 02/10/2019   PLT 267 32/95/1884   Last metabolic panel Lab Results  Component Value Date   GLUCOSE 103 (H) 08/18/2019   NA 138 08/18/2019   K 4.9 08/18/2019   CL 101 08/18/2019   CO2 22 08/18/2019   BUN 16 08/18/2019   CREATININE 1.30 (H) 08/18/2019   GFRNONAA 55 (L)  08/18/2019   GFRAA 63 08/18/2019   CALCIUM 9.8 08/18/2019   PROT 7.3 08/11/2019   ALBUMIN 4.7 08/11/2019   LABGLOB 2.6 08/11/2019   AGRATIO 1.8 08/11/2019   BILITOT 1.3 (H) 08/11/2019   ALKPHOS 43 08/11/2019   AST 28 08/11/2019   ALT 29 08/11/2019   Last lipids Lab Results  Component Value Date   CHOL 156 08/11/2019   HDL 43 08/11/2019   LDLCALC 91 08/11/2019   TRIG 121 08/11/2019   CHOLHDL 3.8 02/10/2019   Last hemoglobin A1c Lab Results  Component Value Date   HGBA1C 5.1 08/11/2019   Last thyroid functions No results found for: TSH, T3TOTAL, T4TOTAL, THYROIDAB Last vitamin D No results found for: 25OHVITD2, 25OHVITD3, VD25OH Last vitamin B12 and Folate No results found for: VITAMINB12, FOLATE  Objective    Vitals: BP 134/81 (BP Location: Right Arm, Patient Position: Sitting, Cuff Size: Large)   Pulse 67   Temp 98 F (36.7 C) (Oral)   Ht 6' (1.829 m)   Wt 219 lb (99.3 kg)   BMI 29.70 kg/m    Physical Exam Vitals reviewed.  Constitutional:      General: He is not in acute distress.    Appearance: Normal appearance. He is well-developed. He is not diaphoretic.  HENT:     Head: Normocephalic and atraumatic.     Right Ear: Tympanic membrane, ear canal and external ear normal.     Left Ear: Tympanic membrane, ear canal and external ear normal.  Eyes:     General: No scleral icterus.    Conjunctiva/sclera: Conjunctivae normal.     Pupils: Pupils are equal, round, and reactive to light.  Neck:     Thyroid: No thyromegaly.  Cardiovascular:     Rate and Rhythm: Normal rate and regular rhythm.     Pulses: Normal pulses.     Heart sounds: Normal heart sounds. No murmur heard.   Pulmonary:     Effort: Pulmonary effort is normal. No respiratory distress.     Breath sounds: Normal breath sounds. No wheezing or rales.  Abdominal:     General: There is no distension.     Palpations: Abdomen is soft.     Tenderness: There is no abdominal tenderness.   Musculoskeletal:        General: No deformity.     Cervical back: Neck supple.     Right lower leg: No edema.     Left lower leg: No edema.  Lymphadenopathy:     Cervical: No cervical adenopathy.  Skin:    General: Skin is warm and dry.     Findings: No rash.  Neurological:     Mental Status: He is alert and oriented to person, place, and time. Mental status is at baseline.     Gait: Gait  normal.  Psychiatric:        Mood and Affect: Mood normal.        Behavior: Behavior normal.        Thought Content: Thought content normal.      Most recent functional status assessment: In your present state of health, do you have any difficulty performing the following activities: 02/16/2020  Hearing? N  Vision? N  Difficulty concentrating or making decisions? Y  Walking or climbing stairs? N  Dressing or bathing? N  Doing errands, shopping? N  Some recent data might be hidden   Most recent fall risk assessment: Fall Risk  02/16/2020  Falls in the past year? 0  Number falls in past yr: 0  Injury with Fall? 0  Follow up Falls evaluation completed    Most recent depression screenings: PHQ 2/9 Scores 02/16/2020 02/10/2019  PHQ - 2 Score 0 0  PHQ- 9 Score 0 0   Most recent cognitive screening: No flowsheet data found. Most recent Audit-C alcohol use screening Alcohol Use Disorder Test (AUDIT) 02/16/2020  1. How often do you have a drink containing alcohol? 3  2. How many drinks containing alcohol do you have on a typical day when you are drinking? 1  3. How often do you have six or more drinks on one occasion? 3  AUDIT-C Score 7  4. How often during the last year have you found that you were not able to stop drinking once you had started? -  5. How often during the last year have you failed to do what was normally expected from you because of drinking? -  6. How often during the last year have you needed a first drink in the morning to get yourself going after a heavy drinking  session? -  7. How often during the last year have you had a feeling of guilt of remorse after drinking? -  8. How often during the last year have you been unable to remember what happened the night before because you had been drinking? -  9. Have you or someone else been injured as a result of your drinking? -  10. Has a relative or friend or a doctor or another health worker been concerned about your drinking or suggested you cut down? -  Alcohol Use Disorder Identification Test Final Score (AUDIT) -   A score of 3 or more in women, and 4 or more in men indicates increased risk for alcohol abuse, EXCEPT if all of the points are from question 1   No results found for any visits on 02/16/20.  Assessment & Plan     Annual wellness visit done today including the all of the following: Reviewed patient's Family Medical History Reviewed and updated list of patient's medical providers Assessment of cognitive impairment was done Assessed patient's functional ability Established a written schedule for health screening [place] Completed and Reviewed  Exercise Activities and Dietary recommendations Goals   None     Immunization History  Administered Date(s) Administered  . Influenza, High Dose Seasonal PF 01/29/2017, 12/24/2017, 12/10/2018  . Influenza-Unspecified 01/28/2011, 02/02/2014, 01/28/2015, 02/07/2020  . PFIZER SARS-COV-2 Vaccination 05/20/2019, 06/10/2019  . Pneumococcal Conjugate-13 12/05/2013  . Pneumococcal Polysaccharide-23 06/05/2015  . Rabies, IM 11/28/2019, 12/01/2019, 12/05/2019, 12/12/2019  . Td 02/10/2019  . Tdap 05/03/2008  . Zoster 03/12/2011  . Zoster Recombinat (Shingrix) 04/10/2018, 07/08/2018    Health Maintenance  Topic Date Due  . COLONOSCOPY  12/12/2025  . TETANUS/TDAP  02/09/2029  . INFLUENZA VACCINE  Completed  . COVID-19 Vaccine  Completed  . Hepatitis C Screening  Completed  . PNA vac Low Risk Adult  Completed      Discussed health benefits of physical activity, and encouraged him to engage in regular exercise appropriate for his age and condition.    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)    Chronic and stable Not on Medications Previously followed by urology Recheck PSA      Relevant Orders   PSA Total (Reflex To Free)     Other   Pure hypercholesterolemia    Previously well controlled Continue simvastatin Recheck CMP and FLP      Relevant Orders   Comprehensive metabolic panel   Lipid panel   Prediabetes    Encourage low-carb diet Recheck A1c      Relevant Orders   Hemoglobin A1c   Overweight    Discussed importance of healthy weight management Discussed diet and exercise        Other Visit Diagnoses    Encounter for annual physical exam    -  Primary   Relevant Orders   Comprehensive metabolic panel   Lipid panel   Hemoglobin A1c   PSA Total (Reflex To Free)   Screening for malignant neoplasm of prostate       Relevant Orders   PSA Total (Reflex To Free)       Return in about 6 months (around 08/16/2020) for chronic disease f/u.     I, Lavon Paganini, MD, have reviewed all documentation for this visit. The documentation on 02/16/20 for the exam, diagnosis, procedures, and orders are all accurate and complete.   Stormy Connon, Dionne Bucy, MD, MPH [facility]

## 2020-02-16 NOTE — Assessment & Plan Note (Signed)
Encourage low carb diet Recheck A1c 

## 2020-02-16 NOTE — Assessment & Plan Note (Signed)
Chronic and stable Not on Medications Previously followed by urology Recheck PSA

## 2020-02-16 NOTE — Patient Instructions (Addendum)
DASH Eating Plan DASH stands for "Dietary Approaches to Stop Hypertension." The DASH eating plan is a healthy eating plan that has been shown to reduce high blood pressure (hypertension). It may also reduce your risk for type 2 diabetes, heart disease, and stroke. The DASH eating plan may also help with weight loss. What are tips for following this plan?  General guidelines  Avoid eating more than 2,300 mg (milligrams) of salt (sodium) a day. If you have hypertension, you may need to reduce your sodium intake to 1,500 mg a day.  Limit alcohol intake to no more than 1 drink a day for nonpregnant women and 2 drinks a day for men. One drink equals 12 oz of beer, 5 oz of wine, or 1 oz of hard liquor.  Work with your health care provider to maintain a healthy body weight or to lose weight. Ask what an ideal weight is for you.  Get at least 30 minutes of exercise that causes your heart to beat faster (aerobic exercise) most days of the week. Activities may include walking, swimming, or biking.  Work with your health care provider or diet and nutrition specialist (dietitian) to adjust your eating plan to your individual calorie needs. Reading food labels   Check food labels for the amount of sodium per serving. Choose foods with less than 5 percent of the Daily Value of sodium. Generally, foods with less than 300 mg of sodium per serving fit into this eating plan.  To find whole grains, look for the word "whole" as the first word in the ingredient list. Shopping  Buy products labeled as "low-sodium" or "no salt added."  Buy fresh foods. Avoid canned foods and premade or frozen meals. Cooking  Avoid adding salt when cooking. Use salt-free seasonings or herbs instead of table salt or sea salt. Check with your health care provider or pharmacist before using salt substitutes.  Do not fry foods. Cook foods using healthy methods such as baking, boiling, grilling, and broiling instead.  Cook with  heart-healthy oils, such as olive, canola, soybean, or sunflower oil. Meal planning  Eat a balanced diet that includes: ? 5 or more servings of fruits and vegetables each day. At each meal, try to fill half of your plate with fruits and vegetables. ? Up to 6-8 servings of whole grains each day. ? Less than 6 oz of lean meat, poultry, or fish each day. A 3-oz serving of meat is about the same size as a deck of cards. One egg equals 1 oz. ? 2 servings of low-fat dairy each day. ? A serving of nuts, seeds, or beans 5 times each week. ? Heart-healthy fats. Healthy fats called Omega-3 fatty acids are found in foods such as flaxseeds and coldwater fish, like sardines, salmon, and mackerel.  Limit how much you eat of the following: ? Canned or prepackaged foods. ? Food that is high in trans fat, such as fried foods. ? Food that is high in saturated fat, such as fatty meat. ? Sweets, desserts, sugary drinks, and other foods with added sugar. ? Full-fat dairy products.  Do not salt foods before eating.  Try to eat at least 2 vegetarian meals each week.  Eat more home-cooked food and less restaurant, buffet, and fast food.  When eating at a restaurant, ask that your food be prepared with less salt or no salt, if possible. What foods are recommended? The items listed may not be a complete list. Talk with your dietitian about   what dietary choices are best for you. Grains Whole-grain or whole-wheat bread. Whole-grain or whole-wheat pasta. Brown rice. Oatmeal. Quinoa. Bulgur. Whole-grain and low-sodium cereals. Pita bread. Low-fat, low-sodium crackers. Whole-wheat flour tortillas. Vegetables Fresh or frozen vegetables (raw, steamed, roasted, or grilled). Low-sodium or reduced-sodium tomato and vegetable juice. Low-sodium or reduced-sodium tomato sauce and tomato paste. Low-sodium or reduced-sodium canned vegetables. Fruits All fresh, dried, or frozen fruit. Canned fruit in natural juice (without  added sugar). Meat and other protein foods Skinless chicken or turkey. Ground chicken or turkey. Pork with fat trimmed off. Fish and seafood. Egg whites. Dried beans, peas, or lentils. Unsalted nuts, nut butters, and seeds. Unsalted canned beans. Lean cuts of beef with fat trimmed off. Low-sodium, lean deli meat. Dairy Low-fat (1%) or fat-free (skim) milk. Fat-free, low-fat, or reduced-fat cheeses. Nonfat, low-sodium ricotta or cottage cheese. Low-fat or nonfat yogurt. Low-fat, low-sodium cheese. Fats and oils Soft margarine without trans fats. Vegetable oil. Low-fat, reduced-fat, or light mayonnaise and salad dressings (reduced-sodium). Canola, safflower, olive, soybean, and sunflower oils. Avocado. Seasoning and other foods Herbs. Spices. Seasoning mixes without salt. Unsalted popcorn and pretzels. Fat-free sweets. What foods are not recommended? The items listed may not be a complete list. Talk with your dietitian about what dietary choices are best for you. Grains Baked goods made with fat, such as croissants, muffins, or some breads. Dry pasta or rice meal packs. Vegetables Creamed or fried vegetables. Vegetables in a cheese sauce. Regular canned vegetables (not low-sodium or reduced-sodium). Regular canned tomato sauce and paste (not low-sodium or reduced-sodium). Regular tomato and vegetable juice (not low-sodium or reduced-sodium). Pickles. Olives. Fruits Canned fruit in a light or heavy syrup. Fried fruit. Fruit in cream or butter sauce. Meat and other protein foods Fatty cuts of meat. Ribs. Fried meat. Bacon. Sausage. Bologna and other processed lunch meats. Salami. Fatback. Hotdogs. Bratwurst. Salted nuts and seeds. Canned beans with added salt. Canned or smoked fish. Whole eggs or egg yolks. Chicken or turkey with skin. Dairy Whole or 2% milk, cream, and half-and-half. Whole or full-fat cream cheese. Whole-fat or sweetened yogurt. Full-fat cheese. Nondairy creamers. Whipped toppings.  Processed cheese and cheese spreads. Fats and oils Butter. Stick margarine. Lard. Shortening. Ghee. Bacon fat. Tropical oils, such as coconut, palm kernel, or palm oil. Seasoning and other foods Salted popcorn and pretzels. Onion salt, garlic salt, seasoned salt, table salt, and sea salt. Worcestershire sauce. Tartar sauce. Barbecue sauce. Teriyaki sauce. Soy sauce, including reduced-sodium. Steak sauce. Canned and packaged gravies. Fish sauce. Oyster sauce. Cocktail sauce. Horseradish that you find on the shelf. Ketchup. Mustard. Meat flavorings and tenderizers. Bouillon cubes. Hot sauce and Tabasco sauce. Premade or packaged marinades. Premade or packaged taco seasonings. Relishes. Regular salad dressings. Where to find more information:  National Heart, Lung, and Blood Institute: www.nhlbi.nih.gov  American Heart Association: www.heart.org Summary  The DASH eating plan is a healthy eating plan that has been shown to reduce high blood pressure (hypertension). It may also reduce your risk for type 2 diabetes, heart disease, and stroke.  With the DASH eating plan, you should limit salt (sodium) intake to 2,300 mg a day. If you have hypertension, you may need to reduce your sodium intake to 1,500 mg a day.  When on the DASH eating plan, aim to eat more fresh fruits and vegetables, whole grains, lean proteins, low-fat dairy, and heart-healthy fats.  Work with your health care provider or diet and nutrition specialist (dietitian) to adjust your eating plan to your   individual calorie needs. This information is not intended to replace advice given to you by your health care provider. Make sure you discuss any questions you have with your health care provider. Document Revised: 03/27/2017 Document Reviewed: 04/07/2016 Elsevier Patient Education  2020 [place] 65 Years and Older, Male Preventive care refers to lifestyle choices and visits with your health care provider that can  promote health and wellness. This includes:  A yearly physical exam. This is also called an annual well check.  Regular dental and eye exams.  Immunizations.  Screening for certain conditions.  Healthy lifestyle choices, such as diet and exercise. What can I expect for my preventive care visit? Physical exam Your health care provider will check:  Height and weight. These may be used to calculate body mass index (BMI), which is a measurement that tells if you are at a healthy weight.  Heart rate and blood pressure.  Your skin for abnormal spots. Counseling Your health care provider may ask you questions about:  Alcohol, tobacco, and drug use.  Emotional well-being.  Home and relationship well-being.  Sexual activity.  Eating habits.  History of falls.  Memory and ability to understand (cognition).  Work and work Statistician. What immunizations do I need?  Influenza (flu) vaccine  This is recommended every year. Tetanus, diphtheria, and pertussis (Tdap) vaccine  You may need a Td booster every 10 years. Varicella (chickenpox) vaccine  You may need this vaccine if you have not already been vaccinated. Zoster (shingles) vaccine  You may need this after age 55. Pneumococcal conjugate (PCV13) vaccine  One dose is recommended after age 62. Pneumococcal polysaccharide (PPSV23) vaccine  One dose is recommended after age 42. Measles, mumps, and rubella (MMR) vaccine  You may need at least one dose of MMR if you were born in 1957 or later. You may also need a second dose. Meningococcal conjugate (MenACWY) vaccine  You may need this if you have certain conditions. Hepatitis A vaccine  You may need this if you have certain conditions or if you travel or work in places where you may be exposed to hepatitis A. Hepatitis B vaccine  You may need this if you have certain conditions or if you travel or work in places where you may be exposed to hepatitis  B. Haemophilus influenzae type b (Hib) vaccine  You may need this if you have certain conditions. You may receive vaccines as individual doses or as more than one vaccine together in one shot (combination vaccines). Talk with your health care provider about the risks and benefits of combination vaccines. What tests do I need? Blood tests  Lipid and cholesterol levels. These may be checked every 5 years, or more frequently depending on your overall health.  Hepatitis C test.  Hepatitis B test. Screening  Lung cancer screening. You may have this screening every year starting at age 54 if you have a 30-pack-year history of smoking and currently smoke or have quit within the past 15 years.  Colorectal cancer screening. All adults should have this screening starting at age 28 and continuing until age 34. Your health care provider may recommend screening at age 23 if you are at increased risk. You will have tests every 1-10 years, depending on your results and the type of screening test.  Prostate cancer screening. Recommendations will vary depending on your family history and other risks.  Diabetes screening. This is done by checking your blood sugar (glucose) after you have not eaten  for a while (fasting). You may have this done every 1-3 years.  Abdominal aortic aneurysm (AAA) screening. You may need this if you are a current or former smoker.  Sexually transmitted disease (STD) testing. Follow these instructions at home: Eating and drinking  Eat a diet that includes fresh fruits and vegetables, whole grains, lean protein, and low-fat dairy products. Limit your intake of foods with high amounts of sugar, saturated fats, and salt.  Take vitamin and mineral supplements as recommended by your health care provider.  Do not drink alcohol if your health care provider tells you not to drink.  If you drink alcohol: ? Limit how much you have to 0-2 drinks a day. ? Be aware of how much  alcohol is in your drink. In the [place] one drink equals one 12 oz bottle of beer (355 mL), one 5 oz glass of wine (148 mL), or one 1 oz glass of hard liquor (44 mL). Lifestyle  Take daily care of your teeth and gums.  Stay active. Exercise for at least 30 minutes on 5 or more days each week.  Do not use any products that contain nicotine or tobacco, such as cigarettes, e-cigarettes, and chewing tobacco. If you need help quitting, ask your health care provider.  If you are sexually active, practice safe sex. Use a condom or other form of protection to prevent STIs (sexually transmitted infections).  Talk with your health care provider about taking a low-dose aspirin or statin. What's next?  Visit your health care provider once a year for a well check visit.  Ask your health care provider how often you should have your eyes and teeth checked.  Stay up to date on all vaccines. This information is not intended to replace advice given to you by your health care provider. Make sure you discuss any questions you have with your health care provider. Document Revised: 04/08/2018 Document Reviewed: 04/08/2018 Elsevier Patient Education  2020 [employer].

## 2020-02-16 NOTE — Assessment & Plan Note (Signed)
Previously well controlled Continue simvastatin Recheck CMP and FLP

## 2020-02-16 NOTE — Assessment & Plan Note (Signed)
Initially elevated, but well controlled on manual recheck Continue current medications Discussed low-salt diet and exercise Recheck metabolic panel Follow-up in 6 months

## 2020-02-16 NOTE — Assessment & Plan Note (Signed)
Discussed importance of healthy weight management Discussed diet and exercise  

## 2020-02-17 LAB — COMPREHENSIVE METABOLIC PANEL
ALT: 42 IU/L (ref 0–44)
AST: 37 IU/L (ref 0–40)
Albumin/Globulin Ratio: 2 (ref 1.2–2.2)
Albumin: 4.7 g/dL (ref 3.7–4.7)
Alkaline Phosphatase: 49 IU/L (ref 44–121)
BUN/Creatinine Ratio: 8 — ABNORMAL LOW (ref 10–24)
BUN: 9 mg/dL (ref 8–27)
Bilirubin Total: 1.3 mg/dL — ABNORMAL HIGH (ref 0.0–1.2)
CO2: 22 mmol/L (ref 20–29)
Calcium: 9.6 mg/dL (ref 8.6–10.2)
Chloride: 102 mmol/L (ref 96–106)
Creatinine, Ser: 1.09 mg/dL (ref 0.76–1.27)
GFR calc Af Amer: 78 mL/min/{1.73_m2} (ref >59)
GFR calc non Af Amer: 67 mL/min/{1.73_m2} (ref >59)
Globulin, Total: 2.4 g/dL (ref 1.5–4.5)
Glucose: 105 mg/dL — ABNORMAL HIGH (ref 65–99)
Potassium: 4.9 mmol/L (ref 3.5–5.2)
Sodium: 140 mmol/L (ref 134–144)
Total Protein: 7.1 g/dL (ref 6.0–8.5)

## 2020-02-17 LAB — LIPID PANEL
Chol/HDL Ratio: 3.7 ratio (ref 0.0–5.0)
Cholesterol, Total: 178 mg/dL (ref 100–199)
HDL: 48 mg/dL (ref >39)
LDL Chol Calc (NIH): 96 mg/dL (ref 0–99)
Triglycerides: 202 mg/dL — ABNORMAL HIGH (ref 0–149)
VLDL Cholesterol Cal: 34 mg/dL (ref 5–40)

## 2020-02-17 LAB — PSA TOTAL (REFLEX TO FREE): Prostate Specific Ag, Serum: 0.4 ng/mL (ref 0.0–4.0)

## 2020-02-17 LAB — HEMOGLOBIN A1C
Est. average glucose Bld gHb Est-mCnc: 105 mg/dL
Hgb A1c MFr Bld: 5.3 % (ref 4.8–5.6)

## 2020-02-24 ENCOUNTER — Ambulatory Visit
Admission: EM | Admit: 2020-02-24 | Discharge: 2020-02-24 | Disposition: A | Discharge status: Home / Self Care | Payer: Federal, State, Local not specified - PPO | Attending: Family Medicine | Admitting: Family Medicine

## 2020-02-24 DIAGNOSIS — L282 Other prurigo: Secondary | ICD-10-CM | POA: Diagnosis not present

## 2020-02-24 DIAGNOSIS — R21 Rash and other nonspecific skin eruption: Secondary | ICD-10-CM

## 2020-02-24 MED ORDER — TRIAMCINOLONE ACETONIDE 0.1 % EX CREA: 1.0000 "application " | TOPICAL_CREAM | Freq: Two times a day (BID) | CUTANEOUS | 0 refills | Status: DC

## 2020-02-24 MED ORDER — DEXAMETHASONE SODIUM PHOSPHATE 10 MG/ML IJ SOLN
10.0000 mg | Freq: Once | INTRAMUSCULAR | Status: AC
  Administered 2020-02-24: 10 mg via INTRAMUSCULAR

## 2020-02-24 MED ORDER — PREDNISONE 10 MG (21) PO TBPK: ORAL_TABLET | Freq: Every day | ORAL | 0 refills | Status: AC

## 2020-02-24 NOTE — Discharge Instructions (Signed)
You have received a steroid injection in the office today  I have sent in triamcinolone cream for you to use twice a day as needed for itching  If symptoms are not resolving over the next 24 hours, I have sent in a prednisone taper for you to take for 6 days. 6 tablets on day one, 5 tablets on day two, 4 tablets on day three, 3 tablets on day four, 2 tablets on day five, and 1 tablet on day six.  Follow up with this office or with primary care if symptoms are persisting.  Follow up in the ER for high fever, trouble swallowing, trouble breathing, other concerning symptoms.

## 2020-02-24 NOTE — ED Triage Notes (Signed)
Pt presents with hives to R arm that appeared yesterday. Unsure of the cause of the hives. Reports taking benadryl pta with minimal relief.

## 2020-02-24 NOTE — ED Provider Notes (Signed)
[facility]   [id number] 02/24/20 Arrival Time: 1400  CC: RASH  SUBJECTIVE:  Ruben Garcia is a 74 y.o. male who presents with a skin complaint that began yesterday. Reports hives to the right arm. Reports that the area is red, itchy and swollen. States that this has happened once in the past. Denies precipitating event or trauma.  Denies changes in soaps, detergents, close contacts with similar rash, known trigger or environmental trigger, allergy. Denies medications change or starting a new medication recently. Has used benadryl cream with temporary relief. There are no aggravating or alleviating factors. Denies similar symptoms in the past that improved with steroids. Denies fever, chills, nausea, vomiting, discharge, oral lesions, SOB, chest pain, abdominal pain, changes in bowel or bladder function.    ROS: As per HPI.  All other pertinent ROS negative.     Past Medical History:  Diagnosis Date  . Allergic rhinitis, cause unspecified   . Basal cell carcinoma of skin, site unspecified 04/28/2010   Nasal; [name].  [name] Kitchen BPH (benign prostatic hypertrophy)   . Essential hypertension, benign   . Hemorrhoids, internal   . Incomplete bladder emptying   . Other abnormal glucose   . Over weight   . Personal history of colonic polyps   . Personal history of other genital system and obstetric disorders(V13.29)   . Pure hypercholesterolemia   . Unspecified disorder of kidney and ureter   . Unspecified disorder of liver    Past Surgical History:  Procedure Laterality Date  . BASAL CELL CARCINOMA EXCISION  2012   Facial        [place]  . COLONOSCOPY  11/13/2010   single polyp; repeatin 5 years; [place]  . COLONOSCOPY WITH PROPOFOL N/A 12/13/2015   Procedure: COLONOSCOPY WITH PROPOFOL;  Surgeon: [name], MD;  Location: [facility] ENDOSCOPY;  Service: Endoscopy;  Laterality: N/A;  . ear surgery  05/2012   [name].  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  Benign.  [name] Kitchen [place]  . LAPAROSCOPIC CHOLECYSTECTOMY  1982  . rotator cuff surgery Right 05/14/2016  . VASECTOMY  1978   Allergies  Allergen Reactions  . Codeine Nausea Only   No current facility-administered medications on file prior to encounter.   Current Outpatient Medications on File Prior to Encounter  Medication Sig Dispense Refill  . aspirin EC 81 MG tablet Take 81 mg by mouth daily.    . Garlic 10 MG CAPS Take 10 mg by mouth daily.     [name] Kitchen ibuprofen (ADVIL,MOTRIN) 200 MG tablet Take 400 mg by mouth every 8 (eight) hours as needed (for pain.).    [name] Kitchen lisinopril (ZESTRIL) 40 MG tablet Take 1 tablet (40 mg total) by mouth daily. TAKE ONE TABLET BY MOUTH ONCE DAILY 90 tablet 3  . Multiple Vitamins-Minerals (MULTIVITAMIN PO) Take by mouth daily.    . Omega-3 Fatty Acids (FISH OIL CONCENTRATE) 1000 MG CAPS Take 1,000 mg by mouth daily.     . sildenafil (VIAGRA) 100 MG tablet Take 1 tablet (100 mg total) by mouth daily as needed for erectile dysfunction. Take two hours prior to intercourse on an empty stomach 90 tablet 1  . simvastatin (ZOCOR) 20 MG tablet Take 1 tablet (20 mg total) by mouth at bedtime. 90 tablet 3  . vitamin E 400 UNIT capsule Take 400 Units by mouth daily.     Social History   Socioeconomic History  . Marital status: Married    Spouse name: Pamala Hurry  .  Number of children: 2  . Years of education: 2 years college  . Highest education level: Not on file  Occupational History  . Occupation: retired    Comment: Actor in 2005  Tobacco Use  . Smoking status: Never Smoker  . Smokeless tobacco: Never Used  Vaping Use  . Vaping Use: Never used  Substance and Sexual Activity  . Alcohol use: Yes    Alcohol/week: 5.0 standard drinks    Types: 5 Cans of beer per week    Comment: weekends beer, 4- 6 total Miller Light  . Drug use: No  . Sexual activity: Yes    Partners: Female  Other Topics Concern  . Not on file  Social History Narrative   Always uses seat belts. Smoke  alarm and carbon monoxide detector in the home. Guns in the home stored in locked cabinet.Caffeine use: Coffee 2 servings per day, moderate amount. Exercise: Moderate walking 2 - 4 miles daily, 2 - 3 days per week.    Marital status:  Married x 47 years, happily.      Children:  2 children; 1 grandchild (1).      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      Tobacco: never      Alcohol:  Weekends; beer 4-6 per weekend.      Exercise:  Walking every other day 2.5 miles three times per day.      Seatbelt: 100%; no texting      Guns: secured guns.      Living Will: completed in 2014.  FULL CODE but no prolonged measures.        ADLs: independent with ADLs; no assistant devices   Social Determinants of Health   Financial Resource Strain:   . Difficulty of Paying Living Expenses: Not on file  Food Insecurity:   . Worried About Charity fundraiser in the Last Year: Not on file  . Ran Out of Food in the Last Year: Not on file  Transportation Needs:   . Lack of Transportation (Medical): Not on file  . Lack of Transportation (Non-Medical): Not on file  Physical Activity:   . Days of Exercise per Week: Not on file  . Minutes of Exercise per Session: Not on file  Stress:   . Feeling of Stress : Not on file  Social Connections:   . Frequency of Communication with Friends and Family: Not on file  . Frequency of Social Gatherings with Friends and Family: Not on file  . Attends Religious Services: Not on file  . Active Member of Clubs or Organizations: Not on file  . Attends Archivist Meetings: Not on file  . Marital Status: Not on file  Intimate Partner Violence:   . Fear of Current or Ex-Partner: Not on file  . Emotionally Abused: Not on file  . Physically Abused: Not on file  . Sexually Abused: Not on file   Family History  Problem Relation Age of Onset  . Heart disease Mother        first MI in 66s  . Diabetes Mother   . Hyperlipidemia Mother   .  Arthritis Mother        rheumatoid  . Cancer Father        lung  . Diabetes Father   . Arthritis Sister   . Hyperlipidemia Sister   . Hyperlipidemia Sister   . Hypertension Sister   . Hyperlipidemia Sister   . Kidney  disease Neg Hx   . Prostate cancer Neg Hx   . Colon cancer Neg Hx     OBJECTIVE: Vitals:   02/24/20 1408 02/24/20 1409  BP: 136/82   Pulse: 80   Resp: 16   Temp: 98.2 F (36.8 C)   TempSrc: Oral   SpO2: 95%   Weight:  215 lb (97.5 kg)  Height:  6' (1.829 m)    General appearance: alert; no distress Head: NCAT Lungs: clear to auscultation bilaterally Heart: regular rate and rhythm.  Radial pulse 2+ bilaterally Extremities: no edema Skin: warm and dry; papular erythematous lesions to inner aspect of R arm  Psychological: alert and cooperative; normal mood and affect  ASSESSMENT & PLAN:  1. Papular urticaria   2. Rash and nonspecific skin eruption     Meds ordered this encounter  Medications  . dexamethasone (DECADRON) injection 10 mg  . predniSONE (STERAPRED UNI-PAK 21 TAB) 10 MG (21) TBPK tablet    Sig: Take by mouth daily for 6 days. Take 6 tablets on day 1, 5 tablets on day 2, 4 tablets on day 3, 3 tablets on day 4, 2 tablets on day 5, 1 tablet on day 6    Dispense:  21 tablet    Refill:  0    Order Specific Question:   Supervising Provider    Answer:   Chase Picket [id number]  . triamcinolone cream (KENALOG) 0.1 %    Sig: Apply 1 application topically 2 (two) times daily.    Dispense:  30 g    Refill:  0    Order Specific Question:   Supervising Provider    Answer:   Chase Picket [[id number]]   Decadron 10mg  IM in office Steroid taper prescribed Triamcinolone prescribed Take as prescribed and to completion Avoid hot showers/ baths Moisturize skin daily  Follow up with PCP if symptoms persists Return or go to the ER if you have any new or worsening symptoms such as fever, chills, nausea, vomiting, redness, swelling, discharge, if  symptoms do not improve with medications  Reviewed expectations re: course of current medical issues. Questions answered. Outlined signs and symptoms indicating need for more acute intervention. Patient verbalized understanding. After Visit Summary given.   Faustino Congress, NP 02/24/20 1442

## 2020-03-11 ENCOUNTER — Other Ambulatory Visit: Payer: Self-pay | Admitting: Family Medicine

## 2020-03-24 ENCOUNTER — Other Ambulatory Visit: Payer: Self-pay | Admitting: Family Medicine

## 2020-03-24 DIAGNOSIS — E7800 Pure hypercholesterolemia, unspecified: Secondary | ICD-10-CM

## 2020-03-24 NOTE — Telephone Encounter (Signed)
Requested Prescriptions  Pending Prescriptions Disp Refills  . simvastatin (ZOCOR) 20 MG tablet [Pharmacy Med Name: Simvastatin 20 MG Oral Tablet] 90 tablet 3    Sig: TAKE 1 TABLET BY MOUTH AT BEDTIME     Cardiovascular:  Antilipid - Statins Failed - 03/24/2020  6:29 PM      Failed - LDL in normal range and within 360 days    LDL Cholesterol (Calc)  Date Value Ref Range Status  02/03/2017 90 mg/dL (calc) Final    Comment:    Reference range: <100 . Desirable range <100 mg/dL for primary prevention;   <70 mg/dL for patients with CHD or diabetic patients  with > or = 2 CHD risk factors. [name] Kitchen LDL-C is now calculated using the Martin-Hopkins  calculation, which is a validated novel method providing  better accuracy than the Friedewald equation in the  estimation of LDL-C.  Cresenciano Genre et al. Annamaria Helling. 0349;179(15): 2061-2068  (http://education.[employer].com/faq/FAQ164)    LDL Chol Calc (NIH)  Date Value Ref Range Status  02/16/2020 96 0 - 99 mg/dL Final         Failed - Triglycerides in normal range and within 360 days    Triglycerides  Date Value Ref Range Status  02/16/2020 202 (H) 0 - 149 mg/dL Final         Passed - Total Cholesterol in normal range and within 360 days    Cholesterol, Total  Date Value Ref Range Status  02/16/2020 178 100 - 199 mg/dL Final         Passed - HDL in normal range and within 360 days    HDL  Date Value Ref Range Status  02/16/2020 48 >39 mg/dL Final         Passed - Patient is not pregnant      Passed - Valid encounter within last 12 months    Recent Outpatient Visits          1 month ago Encounter for annual physical exam   [facility], Dionne Bucy, MD   7 months ago Prediabetes   [facility] [place], Dionne Bucy, MD   1 year ago Encounter for annual physical exam   [facility] [place], Dionne Bucy, MD   1 year ago Exposure to [employer] Virus   [facility], Dionne Bucy, MD   1 year ago Essential (primary) hypertension   [place], Dionne Bucy, MD      Future Appointments            In 4 months [name], Dionne Bucy, MD [facility], [place]   In 5 months McGowan, Shannon A, [place]

## 2020-05-18 ENCOUNTER — Ambulatory Visit: Payer: Federal, State, Local not specified - PPO | Admitting: Family Medicine

## 2020-06-08 ENCOUNTER — Other Ambulatory Visit: Payer: Self-pay | Admitting: Family Medicine

## 2020-06-08 NOTE — Telephone Encounter (Signed)
Requested Prescriptions  Pending Prescriptions Disp Refills  . lisinopril (ZESTRIL) 40 MG tablet [Pharmacy Med Name: Lisinopril 40 MG Oral Tablet] 90 tablet 0    Sig: Take 1 tablet by mouth once daily     Cardiovascular:  ACE Inhibitors Passed - 06/08/2020  8:13 AM      Passed - Cr in normal range and within 180 days    Creat  Date Value Ref Range Status  02/03/2017 1.11 0.70 - 1.25 mg/dL Final    Comment:    For patients >73 years of age, the reference limit for Creatinine is approximately 13% higher for people identified as African-American. .    Creatinine, Ser  Date Value Ref Range Status  02/16/2020 1.09 0.76 - 1.27 mg/dL Final         Passed - K in normal range and within 180 days    Potassium  Date Value Ref Range Status  02/16/2020 4.9 3.5 - 5.2 mmol/L Final         Passed - Patient is not pregnant      Passed - Last BP in normal range    BP Readings from Last 1 Encounters:  02/24/20 136/82         Passed - Valid encounter within last 6 months    Recent Outpatient Visits          3 months ago Encounter for annual physical exam   [facility] [place], Dionne Bucy, MD   10 months ago Prediabetes   [facility], Dionne Bucy, MD   1 year ago Encounter for annual physical exam   [facility] [place], Dionne Bucy, MD   1 year ago Exposure to [place], Dionne Bucy, MD   1 year ago Essential (primary) hypertension   [facility] [name], Dionne Bucy, MD      Future Appointments            In 2 months [name], Dionne Bucy, MD [facility], [place]   In 3 months McGowan, Shannon A, [place]

## 2020-06-28 ENCOUNTER — Telehealth: Payer: Self-pay

## 2020-06-28 NOTE — Telephone Encounter (Signed)
Please advise 

## 2020-06-28 NOTE — Telephone Encounter (Signed)
Copied from [place] [id number]. Topic: General - Other >> Jun 28, 2020  1:16 PM Ruben Garcia wrote: Reason for CRM: Patient called in to inquire of Dr B if she can prescribe him something for Garcia toenail fungus he is having. Please call  Ph# [phone]

## 2020-06-29 MED ORDER — TERBINAFINE HCL 250 MG PO TABS: 250.0000 mg | ORAL_TABLET | Freq: Every day | ORAL | 2 refills | Status: DC

## 2020-06-29 NOTE — Telephone Encounter (Signed)
Sent Rx for terbinafine

## 2020-06-29 NOTE — Addendum Note (Signed)
Addended by: Virginia Crews on: 06/29/2020 08:07 AM   Modules accepted: Orders

## 2020-06-29 NOTE — Telephone Encounter (Signed)
Patient advised as below.  

## 2020-08-13 ENCOUNTER — Telehealth: Payer: Self-pay

## 2020-08-13 MED ORDER — LISINOPRIL 40 MG PO TABS
40.0000 mg | ORAL_TABLET | Freq: Every day | ORAL | 2 refills | Status: DC
Start: 2020-08-13 — End: 2021-02-21

## 2020-08-13 NOTE — Telephone Encounter (Signed)
Copied from [place] [id number]. Topic: General - Other >> Aug 13, 2020 10:14 AM Loma Boston wrote: Reason for CRM:Pt was just caling back after office call re : CPE resch for Dr B.  Pt wanted a reminder put in chart that in resching  he will need a refill on lisinopril (ZESTRIL) 40 MG tablet 90 tablet 0 06/08/2020   Sig: Take 1 tablet by mouth once daily  Sent to pharmacy as: lisinopril (ZESTRIL) 40 MG tablet BEFORE THE RESCHEDULED CPE. Pt states that the others should be fine. Will reach out if not.

## 2020-08-17 ENCOUNTER — Ambulatory Visit: Payer: Federal, State, Local not specified - PPO | Admitting: Family Medicine

## 2020-08-29 ENCOUNTER — Other Ambulatory Visit: Payer: Self-pay | Admitting: Family Medicine

## 2020-09-11 NOTE — Progress Notes (Signed)
09/12/2020 9:21 AM   Ruben Garcia [birth year] [id number]  Referring provider: Virginia Crews, [address] [place] [place],  [place] [address]  Chief Complaint  Patient presents with  . Benign Prostatic Hypertrophy  . Erectile Dysfunction   Urological history: 1. BPH with LU TS -PSA 0.4 in 01/2020 -I PSS 2/0  2. ED -contributing factors of age, BPH, HLD and HTN -SHIM 15 -managed with sildenafil 100 mg, on-demand-dosing    HPI: Ruben Garcia is a 75 y.o. male who presents today for a yearly follow up.  He has no urinary complaints.  Patient denies any modifying or aggravating factors.  Patient denies any gross hematuria, dysuria or suprapubic/flank pain.  Patient denies any fevers, chills, nausea or vomiting.    IPSS    Row Name 09/12/20 0800         International Prostate Symptom Score   How often have you had the sensation of not emptying your bladder? Not at All     How often have you had to urinate less than every two hours? Not at All     How often have you found you stopped and started again several times when you urinated? Not at All     How often have you found it difficult to postpone urination? Not at All     How often have you had a weak urinary stream? Less than 1 in 5 times     How often have you had to strain to start urination? Not at All     How many times did you typically get up at night to urinate? 1 Time     Total IPSS Score 2           Quality of Life due to urinary symptoms   If you were to spend the rest of your life with your urinary condition just the way it is now how would you feel about that? Delighted            Score:  1-7 Mild 8-19 Moderate 20-35 Severe   Patient still having spontaneous erections.   He denies any pain or curvature with erections.     SHIM    Row Name 09/12/20 0850         SHIM: Over the last 6 months:   How do you rate your confidence that you could get and keep an erection?  Moderate     When you had erections with sexual stimulation, how often were your erections hard enough for penetration (entering your partner)? Sometimes (about half the time)     During sexual intercourse, how often were you able to maintain your erection after you had penetrated (entered) your partner? Sometimes (about half the time)     During sexual intercourse, how difficult was it to maintain your erection to completion of intercourse? Difficult     When you attempted sexual intercourse, how often was it satisfactory for you? Sometimes (about half the time)           SHIM Total Score   SHIM 15            Score: 1-7 Severe ED 8-11 Moderate ED 12-16 Mild-Moderate ED 17-21 Mild ED 22-25 No ED  PMH: Past Medical History:  Diagnosis Date  . Allergic rhinitis, cause unspecified   . Basal cell carcinoma of skin, site unspecified 04/28/2010   Nasal; [name].  [name] Kitchen BPH (benign prostatic hypertrophy)   . Essential hypertension, benign   .  Hemorrhoids, internal   . Incomplete bladder emptying   . Other abnormal glucose   . Over weight   . Personal history of colonic polyps   . Personal history of other genital system and obstetric disorders(V13.29)   . Pure hypercholesterolemia   . Unspecified disorder of kidney and ureter   . Unspecified disorder of liver     Surgical History: Past Surgical History:  Procedure Laterality Date  . BASAL CELL CARCINOMA EXCISION  2012   Facial        [place]  . COLONOSCOPY  11/13/2010   single polyp; repeatin 5 years; [facility]  . COLONOSCOPY WITH PROPOFOL N/A 12/13/2015   Procedure: COLONOSCOPY WITH PROPOFOL;  Surgeon: [name], MD;  Location: [facility] ENDOSCOPY;  Service: Endoscopy;  Laterality: N/A;  . ear surgery  05/2012   [name].  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  Benign.  [name] Kitchen Startex  . LAPAROSCOPIC CHOLECYSTECTOMY  1982  . rotator cuff surgery Right 05/14/2016  . VASECTOMY  1978    Home Medications:  Allergies as  of 09/12/2020      Reactions   Codeine Nausea Only      Medication List       Accurate as of Sep 12, 2020  9:21 AM. If you have any questions, ask your nurse or doctor.        aspirin EC 81 MG tablet Take 81 mg by mouth daily.   Fish Oil Concentrate 1000 MG Caps Take 1,000 mg by mouth daily.   Garlic 10 MG Caps Take 10 mg by mouth daily.   ibuprofen 200 MG tablet Commonly known as: ADVIL Take 400 mg by mouth every 8 (eight) hours as needed (for pain.).   lisinopril 40 MG tablet Commonly known as: ZESTRIL Take 1 tablet (40 mg total) by mouth daily.   MULTIVITAMIN PO Take by mouth daily.   sildenafil 100 MG tablet Commonly known as: VIAGRA Take 1 tablet (100 mg total) by mouth daily as needed for erectile dysfunction. Take two hours prior to intercourse on an empty stomach   simvastatin 20 MG tablet Commonly known as: ZOCOR TAKE 1 TABLET BY MOUTH AT BEDTIME   terbinafine 250 MG tablet Commonly known as: LAMISIL Take 1 tablet (250 mg total) by mouth daily.   triamcinolone cream 0.1 % Commonly known as: KENALOG Apply 1 application topically 2 (two) times daily.   vitamin E 180 MG (400 UNITS) capsule Take 400 Units by mouth daily.       Allergies:  Allergies  Allergen Reactions  . Codeine Nausea Only    Family History: Family History  Problem Relation Age of Onset  . Heart disease Mother        first MI in 69s  . Diabetes Mother   . Hyperlipidemia Mother   . Arthritis Mother        rheumatoid  . Cancer Father        lung  . Diabetes Father   . Arthritis Sister   . Hyperlipidemia Sister   . Hyperlipidemia Sister   . Hypertension Sister   . Hyperlipidemia Sister   . Kidney disease Neg Hx   . Prostate cancer Neg Hx   . Colon cancer Neg Hx     Social History:  reports that he has never smoked. He has never used smokeless tobacco. He reports current alcohol use of about 5.0 standard drinks of alcohol per week. He reports that he does not use  drugs.  ROS: For pertinent review of systems please refer to history of present illness  Physical Exam: BP (!) 159/93   Pulse 61   Ht 6' (1.829 m)   Wt 208 lb (94.3 kg)   BMI 28.21 kg/m   Constitutional:  Well nourished. Alert and oriented, No acute distress. HEENT: [place] AT, mask in place.  Trachea midline Cardiovascular: No clubbing, cyanosis, or edema. Respiratory: Normal respiratory effort, no increased work of breathing. GU: No CVA tenderness.  No bladder fullness or masses.  Patient with uncircumcised phallus.  Foreskin easily retracted  Urethral meatus is patent.  No penile discharge. No penile lesions or rashes. Scrotum without lesions, cysts, rashes and/or edema.  Testicles are located scrotally bilaterally. No masses are appreciated in the testicles. Left and right epididymis are normal. Rectal: Patient with  normal sphincter tone. Anus and perineum without scarring or rashes. No rectal masses are appreciated. Prostate is approximately 50 + grams, could only palpate the apex and the midportion of the gland, no nodules are appreciated. Seminal vesicles could not be palpated.  Lymph: No inguinal adenopathy. Neurologic: Grossly intact, no focal deficits, moving all 4 extremities. Psychiatric: Normal mood and affect.  Laboratory Data: Component     Latest Ref Rng & Units 02/16/2020  Glucose     65 - 99 mg/dL 105 (H)  BUN     8 - 27 mg/dL 9  Creatinine     0.76 - 1.27 mg/dL 1.09  GFR, Est Non African American     >59 mL/min/1.73 67  GFR, Est African American     >59 mL/min/1.73 78  BUN/Creatinine Ratio     10 - 24 8 (L)  Sodium     134 - 144 mmol/L 140  Potassium     3.5 - 5.2 mmol/L 4.9  Chloride     96 - 106 mmol/L 102  CO2     20 - 29 mmol/L 22  Calcium     8.6 - 10.2 mg/dL 9.6  Total Protein     6.0 - 8.5 g/dL 7.1  Albumin     3.7 - 4.7 g/dL 4.7  Globulin, Total     1.5 - 4.5 g/dL 2.4  Albumin/Globulin Ratio     1.2 - 2.2 2.0  Total Bilirubin     0.0 -  1.2 mg/dL 1.3 (H)  Alkaline Phosphatase     44 - 121 IU/L 49  AST     0 - 40 IU/L 37  ALT     0 - 44 IU/L 42   Component     Latest Ref Rng & Units 06/25/2016 08/04/2017 02/10/2019 02/16/2020  Prostate Specific Ag, Serum     0.0 - 4.0 ng/mL 0.3 0.4 0.4 0.4    Lab Results  Component Value Date   HGBA1C 5.3 02/16/2020       Component Value Date/Time   CHOL 178 02/16/2020 0911   HDL 48 02/16/2020 0911   CHOLHDL 3.7 02/16/2020 0911   CHOLHDL 3.3 02/03/2017 0952   LDLCALC 96 02/16/2020 0911   LDLCALC 90 02/03/2017 0952    Lab Results  Component Value Date   AST 37 02/16/2020   Lab Results  Component Value Date   ALT 42 02/16/2020   Component     Latest Ref Rng & Units 02/16/2020  Cholesterol, Total     100 - 199 mg/dL 178  Triglycerides     0 - 149 mg/dL 202 (H)  HDL Cholesterol     >39  mg/dL 48  VLDL Cholesterol Cal     5 - 40 mg/dL 34  LDL Chol Calc (NIH)     0 - 99 mg/dL 96  Total CHOL/HDL Ratio     0.0 - 5.0 ratio 3.7  I have reviewed the labs  Assessment & Plan:    1. BPH with LU TS -continue conservative management, avoiding bladder irritants and timed voiding's  2. Erectile dysfunction -has not tried the PDE5i's as of this visit -satisfied with erectile function at this time   Return in about 1 year (around 09/12/2021) for IPSS, SHIM and exam.  These notes generated with voice recognition software. I apologize for typographical errors.  Zara Council, PA-C  [facility] [address] [place], [place] [address] [phone]

## 2020-09-12 ENCOUNTER — Other Ambulatory Visit: Payer: Self-pay

## 2020-09-12 ENCOUNTER — Ambulatory Visit: Payer: Self-pay | Admitting: *Deleted

## 2020-09-12 ENCOUNTER — Ambulatory Visit (INDEPENDENT_AMBULATORY_CARE_PROVIDER_SITE_OTHER): Payer: Federal, State, Local not specified - PPO | Admitting: Urology

## 2020-09-12 ENCOUNTER — Encounter: Payer: Self-pay | Admitting: Urology

## 2020-09-12 VITALS — BP 159/93 | HR 61 | Ht 72.0 in | Wt 208.0 lb

## 2020-09-12 DIAGNOSIS — N529 Male erectile dysfunction, unspecified: Secondary | ICD-10-CM

## 2020-09-12 DIAGNOSIS — N138 Other obstructive and reflux uropathy: Secondary | ICD-10-CM | POA: Diagnosis not present

## 2020-09-12 DIAGNOSIS — N401 Enlarged prostate with lower urinary tract symptoms: Secondary | ICD-10-CM

## 2020-09-12 NOTE — Telephone Encounter (Signed)
Pt reports BP has been elevated "For months now." States prior to call 169/97, this AM 174/97. States "Bottom number never over 100 but consistently high 90's"  Denies any associated symptoms. No headache, no dizziness, weakness, no visual changes. Has not missed any doses of meds. Does reports increased fatigue. No availability within 3 day advisement. Assured pt NT would route to practice for PCPs review and final disposition. Advised ED for any  Severe headache, dizziness, blurred vision, unilateral weakness, speech difficulty. Continue to monitor BP and record. Pt verbalizes understanding.  Please advise: CB# [phone] Reason for Disposition . Systolic BP  >= 993 OR Diastolic >= 716  Answer Assessment - Initial Assessment Questions 1. BLOOD PRESSURE: "What is the blood pressure?" "Did you take at least two measurements 5 minutes apart?"     169/97 2. ONSET: "When did you take your blood pressure?"     15 minutes ago 3. HOW: "How did you obtain the blood pressure?" (e.g., visiting nurse, automatic home BP monitor)    Home monitor 4. HISTORY: "Do you have a history of high blood pressure?"     yes 5. MEDICATIONS: "Are you taking any medications for blood pressure?" "Have you missed any doses recently?"     No 6. OTHER SYMPTOMS: "Do you have any symptoms?" (e.g., headache, chest pain, blurred vision, difficulty breathing, weakness)     Increased fatigue.  Protocols used: BLOOD PRESSURE - HIGH-A-AH

## 2020-09-13 MED ORDER — HYDROCHLOROTHIAZIDE 12.5 MG PO CAPS: 12.5000 mg | ORAL_CAPSULE | Freq: Every day | ORAL | 1 refills | Status: DC

## 2020-09-13 NOTE — Telephone Encounter (Signed)
I do not have any available appts unfortunately, but we can add HCTZ 12.5 mg daily as an additional blood pressure medication in addition to his current lisinopril.  Okay to send in 30-day supply with 1 refill if patient agrees.  He could then have nurse visit for blood pressure recheck in 3 to 4 weeks so we could reassess.  We can give him my next available appointment also.

## 2020-09-13 NOTE — Addendum Note (Signed)
Addended by: Minette Headland on: 09/13/2020 10:54 AM   Modules accepted: Orders

## 2020-09-13 NOTE — Telephone Encounter (Signed)
Spoke with patient on the phone who reports that he has noticed fluctuation in blood pressure for over 2 months. Patient denies chest pain, shortness of breath, visual changes, paresthesias, edema or speech difficulty. I offered patient appointment this Friday to be evaluated by PA, patient states that he does not want to see anyone but you. I checked your schedule and you have no open appointments in the month of June. Please advise. KW

## 2020-09-13 NOTE — Telephone Encounter (Signed)
Patient advised.KW 

## 2020-09-27 ENCOUNTER — Other Ambulatory Visit: Payer: Self-pay | Admitting: Family Medicine

## 2020-09-27 NOTE — Telephone Encounter (Signed)
Medication Refill - Medication: terbinafine (LAMISIL) 250 MG tablet    Has the patient contacted their pharmacy? No. Patient states the fungus on his 2 big toe has improved slightly and would like PCP to refill medication he has 3 days left    Preferred Pharmacy (with phone number or street name):   [place], [place] Phone:  [phone]  Fax:  [phone]       Agent: Please be advised that RX refills may take up to 3 business days. We ask that you follow-up with your pharmacy.

## 2020-09-27 NOTE — Telephone Encounter (Signed)
Requested medication (s) are due for refill today: yes   Requested medication (s) are on the active medication list: yes   Last refill:  06/28/2020  Future visit scheduled: yes   Notes to clinic:  Medication not assigned to a protocol, review manually   Requested Prescriptions  Pending Prescriptions Disp Refills   terbinafine (LAMISIL) 250 MG tablet 30 tablet 2    Sig: Take 1 tablet (250 mg total) by mouth daily.      Off-Protocol Failed - 09/27/2020  8:41 AM      Failed - Medication not assigned to a protocol, review manually.      Passed - Valid encounter within last 12 months    Recent Outpatient Visits           7 months ago Encounter for annual physical exam   [facility] [place], Dionne Bucy, MD   1 year ago Prediabetes   [facility] [place], Dionne Bucy, MD   1 year ago Encounter for annual physical exam   [facility] [place], Dionne Bucy, MD   1 year ago Exposure to [place], Dionne Bucy, MD   2 years ago Essential (primary) hypertension   [facility] [name], Dionne Bucy, MD       Future Appointments             In 11 months McGowan, Gordan Payment [facility]

## 2020-09-28 MED ORDER — TERBINAFINE HCL 250 MG PO TABS: 250.0000 mg | ORAL_TABLET | Freq: Every day | ORAL | 0 refills | Status: DC

## 2021-01-14 ENCOUNTER — Ambulatory Visit: Payer: Self-pay | Admitting: *Deleted

## 2021-01-14 NOTE — Telephone Encounter (Signed)
Please review for Dr. [name].   Thanks,   -Natelie Ostrosky  

## 2021-01-14 NOTE — Telephone Encounter (Signed)
Patient reports "passing out " this am approx. 4 am when standing up out of bed to go to bathroom. Stood up , felt weak and "went backwards" and episode only lasted 5-6 seconds. Reports feeling fatigue prior to today and has been on a diet. B/P prior to call 95/65. Patient reports he is now drinking water and wants to eat something before rechecking B/P as NT requesting to recheck B/P. Denies chest pain, difficulty breathing, heart palpitations, no dizziness or lightheadedness. Reports he has not been taking Microzide as listed on med list for "a while". Patient has been taking lisinopril as prescribed. Next appt 02/21/21 previously scheduled. No available appt this week noted. Encouraged patient to get evaluated in ED. Patient would like to eat and recheck B/P and call back to see if he should go to ED. Please advise . Patient would like to hear from PCP . Care advise given. Patient verbalized understanding of care advise and to call back and then go to ED if symptoms worsen.

## 2021-01-14 NOTE — Telephone Encounter (Signed)
Patient returned call. Reviewed message from Dr. Caryn Section to reduce lisinopril to 1/2 tablet daily . Push fluids . If get dizzy again then go to ER. Patient verbalized understanding  and repots B/P rechecked for 126/74 after eating and drinking fluids today .

## 2021-01-14 NOTE — Telephone Encounter (Signed)
Reduce lisinopril to 1/2 tablet daily. Push fluids. If gets dizzy again then go to ER.

## 2021-01-14 NOTE — Telephone Encounter (Signed)
Reason for Disposition  [1] Age > 50 years  AND [2] now alert and feels fine  Answer Assessment - Initial Assessment Questions 1. ONSET: "How long were you unconscious?" (minutes) "When did it happen?"     5-6 seconds . Happened approx. 4 am getting up out of bed going to bathroom and became weak and "went backwards" and fell  2. CONTENT: "What happened during period of unconsciousness?" (e.g., seizure activity)      Reports he was not unconscious  3. MENTAL STATUS: "Alert and oriented now?" (oriented x 3 = name, month, location)      Yes  4. TRIGGER: "What do you think caused the fainting?" "What were you doing just before you fainted?"  (e.g., exercise, sudden standing up, prolonged standing)     Not sure . Maybe not drinking enough. Stood up at 4 am from bed to walk to BR 5. RECURRENT SYMPTOM: "Have you ever passed out before?" If Yes, ask: "When was the last time?" and "What happened that time?"      no 6. INJURY: "Did you sustain any injury during the fall?"      No  7. CARDIAC SYMPTOMS: "Have you had any of the following symptoms: chest pain, difficulty breathing, palpitations?"     Denies  8. NEUROLOGIC SYMPTOMS: "Have you had any of the following symptoms: headache, numbness, vertigo, weakness?"     Fatigue  9. GI SYMPTOMS: "Have you had any of the following symptoms: abdominal pain, vomiting, diarrhea, blood in stools?"     Denies  10. OTHER SYMPTOMS: "Do you have any other symptoms?"       Has been on a diet but reports he has been eating  11. PREGNANCY: "Is there any chance you are pregnant?" "When was your last menstrual period?"       na  Protocols used: Fainting-A-AH

## 2021-01-14 NOTE — Telephone Encounter (Signed)
LMTCB 01/14/2021.  PEC please advise pt as below when he calls back.   Thanks,   -Mickel Baas

## 2021-02-21 ENCOUNTER — Encounter: Payer: Self-pay | Admitting: Family Medicine

## 2021-02-21 ENCOUNTER — Ambulatory Visit (INDEPENDENT_AMBULATORY_CARE_PROVIDER_SITE_OTHER): Payer: Federal, State, Local not specified - PPO | Admitting: Family Medicine

## 2021-02-21 ENCOUNTER — Other Ambulatory Visit: Payer: Self-pay

## 2021-02-21 VITALS — BP 128/70 | HR 69 | Temp 97.6°F | Resp 16 | Ht 72.0 in | Wt 203.3 lb

## 2021-02-21 DIAGNOSIS — R7303 Prediabetes: Secondary | ICD-10-CM

## 2021-02-21 DIAGNOSIS — Z0000 Encounter for general adult medical examination without abnormal findings: Secondary | ICD-10-CM | POA: Diagnosis not present

## 2021-02-21 DIAGNOSIS — E7800 Pure hypercholesterolemia, unspecified: Secondary | ICD-10-CM | POA: Diagnosis not present

## 2021-02-21 DIAGNOSIS — I10 Essential (primary) hypertension: Secondary | ICD-10-CM

## 2021-02-21 DIAGNOSIS — E663 Overweight: Secondary | ICD-10-CM

## 2021-02-21 DIAGNOSIS — N401 Enlarged prostate with lower urinary tract symptoms: Secondary | ICD-10-CM

## 2021-02-21 MED ORDER — FLUTICASONE PROPIONATE 50 MCG/ACT NA SUSP: 2.0000 | Freq: Every day | NASAL | 6 refills | Status: DC

## 2021-02-21 MED ORDER — LISINOPRIL 20 MG PO TABS: 20.0000 mg | ORAL_TABLET | Freq: Every day | ORAL | 1 refills | Status: DC

## 2021-02-21 MED ORDER — SIMVASTATIN 20 MG PO TABS: 20.0000 mg | ORAL_TABLET | Freq: Every day | ORAL | 3 refills | Status: DC

## 2021-02-21 NOTE — Progress Notes (Signed)
Complete physical exam   Patient: Ruben Garcia   DOB: 1945/05/26   75 y.o. Male  MRN: [id number] Visit Date: 02/21/2021  Today's healthcare provider: Lavon Paganini, MD   Chief Complaint  Patient presents with   Annual Exam   Subjective    Ruben Garcia is a 75 y.o. male who presents today for a complete physical exam.  He reports consuming a general diet. Home exercise routine includes walking 2 hrs per week. He generally feels well. He reports sleeping well. He does not have additional problems to discuss today.  HPI    Past Medical History:  Diagnosis Date   Allergic rhinitis, cause unspecified    Basal cell carcinoma of skin, site unspecified 04/28/2010   Nasal; [name].   BPH (benign prostatic hypertrophy)    Essential hypertension, benign    Hemorrhoids, internal    Incomplete bladder emptying    Other abnormal glucose    Over weight    Personal history of colonic polyps    Personal history of other genital system and obstetric disorders(V13.29)    Pure hypercholesterolemia    Unspecified disorder of kidney and ureter    Unspecified disorder of liver    Past Surgical History:  Procedure Laterality Date   BASAL CELL CARCINOMA EXCISION  2012   Facial        [place]   COLONOSCOPY  11/13/2010   single polyp; repeatin 5 years; [place]   COLONOSCOPY WITH PROPOFOL N/A 12/13/2015   Procedure: COLONOSCOPY WITH PROPOFOL;  Surgeon: [name], MD;  Location: [facility];  Service: Endoscopy;  Laterality: N/A;   ear surgery  05/2012   [name].  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  Benign.   [place]   rotator cuff surgery Right 05/14/2016   VASECTOMY  1978   Social History   Socioeconomic History   Marital status: Married    Spouse name: Pamala Hurry   Number of children: 2   Years of education: 2 years college   Highest education level: Not on file  Occupational History   Occupation: retired     Comment: postal service in 2005  Tobacco Use   Smoking status: Never   Smokeless tobacco: Never  Vaping Use   Vaping Use: Never used  Substance and Sexual Activity   Alcohol use: Yes    Alcohol/week: 5.0 standard drinks    Types: 5 Cans of beer per week    Comment: weekends beer, 4- 6 total [place]   Drug use: No   Sexual activity: Yes    Partners: Female  Other Topics Concern   Not on file  Social History Narrative   Always uses seat belts. Smoke alarm and carbon monoxide detector in the home. Guns in the home stored in locked cabinet.Caffeine use: Coffee 2 servings per day, moderate amount. Exercise: Moderate walking 2 - 4 miles daily, 2 - 3 days per week.    Marital status:  Married x 47 years, happily.      Children:  2 children; 1 grandchild (48).      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      Tobacco: never      Alcohol:  Weekends; beer 4-6 per weekend.      Exercise:  Walking every other day 2.5 miles three times per day.      Seatbelt: 100%; no texting  Guns: secured guns.      Living Will: completed in 2014.  FULL CODE but no prolonged measures.        ADLs: independent with ADLs; no assistant devices   Social Determinants of Health   Financial Resource Strain: Not on file  Food Insecurity: Not on file  Transportation Needs: Not on file  Physical Activity: Not on file  Stress: Not on file  Social Connections: Not on file  Intimate Partner Violence: Not on file   Family Status  Relation Name Status   Mother  Deceased at age 97       heart disease   Father  Deceased at age 55       lung cancer   Sister 16 Alive   Sister 2 Alive   Sister 3 Alive   Neg Hx  (Not Specified)   Family History  Problem Relation Age of Onset   Heart disease Mother        first MI in 29s   Diabetes Mother    Hyperlipidemia Mother    Arthritis Mother        rheumatoid   Cancer Father        lung   Diabetes Father    Arthritis Sister     Hyperlipidemia Sister    Hyperlipidemia Sister    Hypertension Sister    Hyperlipidemia Sister    Kidney disease Neg Hx    Prostate cancer Neg Hx    Colon cancer Neg Hx    Allergies  Allergen Reactions   Codeine Nausea Only    Patient Care Team: Virginia Crews, MD as PCP - General (Family Medicine)   Medications: Outpatient Medications Prior to Visit  Medication Sig   aspirin EC 81 MG tablet Take 81 mg by mouth daily.   Garlic 10 MG CAPS Take 10 mg by mouth daily.    ibuprofen (ADVIL,MOTRIN) 200 MG tablet Take 400 mg by mouth every 8 (eight) hours as needed (for pain.).   Multiple Vitamins-Minerals (MULTIVITAMIN PO) Take by mouth daily.   Omega-3 Fatty Acids (FISH OIL CONCENTRATE) 1000 MG CAPS Take 1,000 mg by mouth daily.    sildenafil (VIAGRA) 100 MG tablet Take 1 tablet (100 mg total) by mouth daily as needed for erectile dysfunction. Take two hours prior to intercourse on an empty stomach   terbinafine (LAMISIL) 250 MG tablet Take 1 tablet (250 mg total) by mouth daily.   triamcinolone cream (KENALOG) 0.1 % Apply 1 application topically 2 (two) times daily.   vitamin E 400 UNIT capsule Take 400 Units by mouth daily.   [DISCONTINUED] hydrochlorothiazide (MICROZIDE) 12.5 MG capsule Take 1 capsule (12.5 mg total) by mouth daily.   [DISCONTINUED] lisinopril (ZESTRIL) 40 MG tablet Take 1 tablet (40 mg total) by mouth daily.   [DISCONTINUED] simvastatin (ZOCOR) 20 MG tablet TAKE 1 TABLET BY MOUTH AT BEDTIME   No facility-administered medications prior to visit.    Review of Systems  HENT:  Positive for voice change.   All other systems reviewed and are negative.    Objective    BP 128/70 (BP Location: Left Arm, Patient Position: Sitting, Cuff Size: Large)   Pulse 69   Temp 97.6 F (36.4 C) (Temporal)   Resp 16   Ht 6' (1.829 m)   Wt 203 lb 4.8 oz (92.2 kg)   SpO2 96%   BMI 27.57 kg/m    Physical Exam Vitals reviewed.  Constitutional:      General: He is  not  in acute distress.    Appearance: Normal appearance. He is well-developed. He is not diaphoretic.  HENT:     Head: Normocephalic and atraumatic.     Right Ear: Tympanic membrane, ear canal and external ear normal.     Left Ear: Tympanic membrane, ear canal and external ear normal.     Nose: Nose normal.     Mouth/Throat:     Mouth: Mucous membranes are moist.     Pharynx: Oropharynx is clear. No oropharyngeal exudate.  Eyes:     General: No scleral icterus.    Conjunctiva/sclera: Conjunctivae normal.     Pupils: Pupils are equal, round, and reactive to light.  Neck:     Thyroid: No thyromegaly.  Cardiovascular:     Rate and Rhythm: Normal rate and regular rhythm.     Pulses: Normal pulses.     Heart sounds: Normal heart sounds. No murmur heard. Pulmonary:     Effort: Pulmonary effort is normal. No respiratory distress.     Breath sounds: Normal breath sounds. No wheezing or rales.  Abdominal:     General: There is no distension.     Palpations: Abdomen is soft.     Tenderness: There is no abdominal tenderness.  Musculoskeletal:        General: No deformity.     Cervical back: Neck supple.     Right lower leg: No edema.     Left lower leg: No edema.  Lymphadenopathy:     Cervical: No cervical adenopathy.  Skin:    General: Skin is warm and dry.     Findings: No rash.  Neurological:     Mental Status: He is alert and oriented to person, place, and time. Mental status is at baseline.     Sensory: No sensory deficit.     Motor: No weakness.     Gait: Gait normal.  Psychiatric:        Mood and Affect: Mood normal.        Behavior: Behavior normal.        Thought Content: Thought content normal.     Last depression screening scores PHQ 2/9 Scores 02/21/2021 02/16/2020 02/10/2019  PHQ - 2 Score 0 0 0  PHQ- 9 Score 0 0 0   Last fall risk screening Fall Risk  02/21/2021  Falls in the past year? 1  Number falls in past yr: 0  Injury with Fall? 0  Risk for fall due to :  No Fall Risks  Follow up Falls evaluation completed   Last Audit-C alcohol use screening Alcohol Use Disorder Test (AUDIT) 02/21/2021  1. How often do you have a drink containing alcohol? 3  2. How many drinks containing alcohol do you have on a typical day when you are drinking? 0  3. How often do you have six or more drinks on one occasion? 1  AUDIT-C Score 4  4. How often during the last year have you found that you were not able to stop drinking once you had started? 0  5. How often during the last year have you failed to do what was normally expected from you because of drinking? 0  6. How often during the last year have you needed a first drink in the morning to get yourself going after a heavy drinking session? 0  7. How often during the last year have you had a feeling of guilt of remorse after drinking? 0  8. How often during the last  year have you been unable to remember what happened the night before because you had been drinking? 0  9. Have you or someone else been injured as a result of your drinking? 0  10. Has a relative or friend or a doctor or another health worker been concerned about your drinking or suggested you cut down? 0  Alcohol Use Disorder Identification Test Final Score (AUDIT) 4   A score of 3 or more in women, and 4 or more in men indicates increased risk for alcohol abuse, EXCEPT if all of the points are from question 1   No results found for any visits on 02/21/21.  Assessment & Plan    Routine Health Maintenance and Physical Exam  Exercise Activities and Dietary recommendations  Goals   None     Immunization History  Administered Date(s) Administered   Fluad Quad(high Dose 65+) 12/11/2020   Influenza, High Dose Seasonal PF 01/29/2017, 12/24/2017, 12/10/2018   Influenza-Unspecified 01/28/2011, 02/02/2014, 01/28/2015, 02/07/2020   PFIZER(Purple Top)SARS-COV-2 Vaccination 05/20/2019, 06/10/2019, 01/22/2020, 08/21/2020   Pneumococcal Conjugate-13  12/05/2013   Pneumococcal Polysaccharide-23 06/05/2015   Rabies, IM 11/28/2019, 12/01/2019, 12/05/2019, 12/12/2019   Td 02/10/2019   Tdap 05/03/2008   Zoster Recombinat (Shingrix) 04/10/2018, 07/08/2018   Zoster, Live 03/12/2011    Health Maintenance  Topic Date Due   COVID-19 Vaccine (5 - Booster for [place] series) 10/16/2020   COLONOSCOPY (Pts 45-79yrs Insurance coverage will need to be confirmed)  12/12/2025   TETANUS/TDAP  02/09/2029   Pneumonia Vaccine 49+ Years old  Completed   INFLUENZA VACCINE  Completed   Hepatitis C Screening  Completed   Zoster Vaccines- Shingrix  Completed   HPV VACCINES  Aged Out    Discussed health benefits of physical activity, and encouraged him to engage in regular exercise appropriate for his age and condition.  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)    Chronic and stable Not on medications Previously f/b urology Recheck PSA      Relevant Orders   PSA     Other   Pure hypercholesterolemia    Previously well controlled Continue statin Repeat FLP and CMP      Relevant Medications   lisinopril (ZESTRIL) 20 MG tablet   simvastatin (ZOCOR) 20 MG tablet   Other Relevant Orders   Comprehensive metabolic panel   Lipid Panel With LDL/HDL Ratio   Prediabetes    Recommend low carb diet Recheck A1c       Relevant Orders   Hemoglobin A1c   Overweight    Discussed importance of healthy weight management Discussed diet and exercise       Other Visit Diagnoses     Encounter for annual physical exam    -  Primary   Relevant Orders    Comprehensive metabolic panel   Hemoglobin A1c   Lipid Panel With LDL/HDL Ratio   PSA        Return in about 6 months (around 08/22/2021) for chronic disease f/u.     I, Lavon Paganini, MD, have reviewed all documentation for  this visit. The documentation on 02/21/21 for the exam, diagnosis, procedures, and orders are all accurate and complete.   Alorah Mcree, Dionne Bucy, MD, MPH [facility]

## 2021-02-21 NOTE — Assessment & Plan Note (Signed)
Discussed importance of healthy weight management Discussed diet and exercise  

## 2021-02-21 NOTE — Assessment & Plan Note (Signed)
Recommend low carb diet °Recheck A1c  °

## 2021-02-21 NOTE — Assessment & Plan Note (Signed)
Chronic and stable Not on medications Previously f/b urology Recheck PSA

## 2021-02-21 NOTE — Assessment & Plan Note (Signed)
Previously well controlled Continue statin Repeat FLP and CMP  

## 2021-02-21 NOTE — Assessment & Plan Note (Signed)
Well controlled Continue current medications Recheck metabolic panel F/u in 6 months  

## 2021-02-22 ENCOUNTER — Telehealth: Payer: Self-pay

## 2021-02-22 ENCOUNTER — Telehealth: Payer: Self-pay | Admitting: Family Medicine

## 2021-02-22 LAB — COMPREHENSIVE METABOLIC PANEL
ALT: 30 IU/L (ref 0–44)
AST: 26 IU/L (ref 0–40)
Albumin/Globulin Ratio: 1.9 (ref 1.2–2.2)
Albumin: 4.8 g/dL — ABNORMAL HIGH (ref 3.7–4.7)
Alkaline Phosphatase: 54 IU/L (ref 44–121)
BUN/Creatinine Ratio: 6 — ABNORMAL LOW (ref 10–24)
BUN: 7 mg/dL — ABNORMAL LOW (ref 8–27)
Bilirubin Total: 1.2 mg/dL (ref 0.0–1.2)
CO2: 25 mmol/L (ref 20–29)
Calcium: 9.8 mg/dL (ref 8.6–10.2)
Chloride: 102 mmol/L (ref 96–106)
Creatinine, Ser: 1.13 mg/dL (ref 0.76–1.27)
Globulin, Total: 2.5 g/dL (ref 1.5–4.5)
Glucose: 110 mg/dL — ABNORMAL HIGH (ref 70–99)
Potassium: 5.3 mmol/L — ABNORMAL HIGH (ref 3.5–5.2)
Sodium: 142 mmol/L (ref 134–144)
Total Protein: 7.3 g/dL (ref 6.0–8.5)
eGFR: 69 mL/min/{1.73_m2} (ref >59)

## 2021-02-22 LAB — LIPID PANEL WITH LDL/HDL RATIO
Cholesterol, Total: 188 mg/dL (ref 100–199)
HDL: 55 mg/dL (ref >39)
LDL Chol Calc (NIH): 115 mg/dL — ABNORMAL HIGH (ref 0–99)
LDL/HDL Ratio: 2.1 ratio (ref 0.0–3.6)
Triglycerides: 98 mg/dL (ref 0–149)
VLDL Cholesterol Cal: 18 mg/dL (ref 5–40)

## 2021-02-22 LAB — PSA: Prostate Specific Ag, Serum: 0.4 ng/mL (ref 0.0–4.0)

## 2021-02-22 LAB — HEMOGLOBIN A1C
Est. average glucose Bld gHb Est-mCnc: 97 mg/dL
Hgb A1c MFr Bld: 5 % (ref 4.8–5.6)

## 2021-02-22 NOTE — Telephone Encounter (Signed)
Copied from [place] [id number]. Topic: General - Other >> Feb 22, 2021  4:21 PM Lionel December wrote: Reason for CRM: Patient calling in to get lab results. Requesting call back please

## 2021-02-22 NOTE — Telephone Encounter (Signed)
ERROR

## 2021-02-25 NOTE — Progress Notes (Signed)
Increase simvastatin to 40mg  daily.

## 2021-02-26 ENCOUNTER — Telehealth: Payer: Self-pay

## 2021-02-26 NOTE — Telephone Encounter (Signed)
Reviewed message with patient. Patient is requesting to hold off on increasing simvastatin to 40mg . He will work on diet and exercise and will keep follow up in April.      Ruben Crews, MD  02/25/2021  8:17 AM EDT     Increase simvastatin to 40mg  daily.   Ruben Garcia, [facility]  02/22/2021  4:48 PM EDT     Advised to continue taking medication regularly as prescribed until advised otherwise and beginning trying to incorporate the 30 minutes of exercising 5xs weekly into his routine   Ruben Garcia, [facility]  02/22/2021  4:46 PM EDT     Patient aware. Verbalized understanding. Patient states that he has been taking his simvastatin regularly so he would like to know if he should make any changes to his medications or wait until 6 month f/u and see what happens.   Ruben Crews, MD  02/22/2021 10:21 AM EDT     Normal/stable labs, except for cholesterol increased some.  If not taking simvastatin regularly, please resume. Also recommend diet low in saturated fat and regular exercise - 30 min at least 5 times per week

## 2021-02-26 NOTE — Telephone Encounter (Signed)
Copied from [place] [id number]. Topic: General - Other >> Feb 26, 2021  4:23 PM Parke Poisson wrote: Reason for CRM: Pt states that someone from office called him but did not leave a message

## 2021-02-28 NOTE — Telephone Encounter (Signed)
Noted  

## 2021-03-06 ENCOUNTER — Ambulatory Visit: Payer: Self-pay

## 2021-03-06 ENCOUNTER — Telehealth (INDEPENDENT_AMBULATORY_CARE_PROVIDER_SITE_OTHER): Payer: Federal, State, Local not specified - PPO | Admitting: Physician Assistant

## 2021-03-06 ENCOUNTER — Other Ambulatory Visit: Payer: Self-pay

## 2021-03-06 DIAGNOSIS — U071 COVID-19: Secondary | ICD-10-CM | POA: Diagnosis not present

## 2021-03-06 MED ORDER — MOLNUPIRAVIR EUA 200MG CAPSULE: 4.0000 | ORAL_CAPSULE | Freq: Two times a day (BID) | ORAL | 0 refills | Status: AC

## 2021-03-06 NOTE — Progress Notes (Addendum)
MyChart Telephone Visit   Addendum 03/07/2021 4:19 pm To clarify, visit was originally supposed to be a video visit but was transitioned to a telephone visit due to technical difficulties. This visit was only completed over the phone.   Virtual Visit via Telephone   This visit type was conducted due to national recommendations for restrictions regarding the COVID-19 Pandemic (e.g. social distancing) in an effort to limit this patient's exposure and mitigate transmission in our community. This patient is at least at moderate risk for complications without adequate follow up. This format is felt to be most appropriate for this patient at this time. Physical exam was limited by quality of the video and audio technology used for the visit.   Patient location: home Provider location: [facility]  I discussed the limitations of evaluation and management by telemedicine and the availability of in person appointments. The patient expressed understanding and agreed to proceed.  Patient: Ruben Garcia   DOB: 07-Aug-1945   75 y.o. Male  MRN: [id number] Visit Date: 03/06/2021  Today's healthcare provider: Mikey Kirschner, PA-C   Cc. Tested positive for covid19  Subjective    HPI  Puneet is a 75 y/o male who presents today after testing positive for COVID-19 this morning.  He states that last night he started not feeling very well, with runny nose and headaches.  This morning he has chills, cough, runny nose, headache, joint pain, postnasal drip.  She is taking ibuprofen to manage symptoms.  Unsure if he has a fever but does have chills and body aches.  Denies any chest pain, shortness of breath, lightheadedness/dizziness. This is his first COVID infection.  Medications: Outpatient Medications Prior to Visit  Medication Sig   aspirin EC 81 MG tablet Take 81 mg by mouth daily.   fluticasone (FLONASE) 50 MCG/ACT nasal spray Place 2 sprays into both nostrils daily.   Garlic 10 MG CAPS Take 10 mg by  mouth daily.    ibuprofen (ADVIL,MOTRIN) 200 MG tablet Take 400 mg by mouth every 8 (eight) hours as needed (for pain.).   lisinopril (ZESTRIL) 20 MG tablet Take 1 tablet (20 mg total) by mouth daily.   Multiple Vitamins-Minerals (MULTIVITAMIN PO) Take by mouth daily.   Omega-3 Fatty Acids (FISH OIL CONCENTRATE) 1000 MG CAPS Take 1,000 mg by mouth daily.    simvastatin (ZOCOR) 20 MG tablet Take 1 tablet (20 mg total) by mouth at bedtime.   terbinafine (LAMISIL) 250 MG tablet Take 1 tablet (250 mg total) by mouth daily.   triamcinolone cream (KENALOG) 0.1 % Apply 1 application topically 2 (two) times daily.   vitamin E 400 UNIT capsule Take 400 Units by mouth daily.   sildenafil (VIAGRA) 100 MG tablet Take 1 tablet (100 mg total) by mouth daily as needed for erectile dysfunction. Take two hours prior to intercourse on an empty stomach (Patient not taking: Reported on 03/06/2021)   No facility-administered medications prior to visit.    Review of Systems  Constitutional:  Positive for chills and fatigue.  HENT:  Positive for postnasal drip, rhinorrhea and sinus pain.   Respiratory:  Positive for cough.   Musculoskeletal:  Positive for arthralgias.  All other systems reviewed and are negative.   Objective      Physical Exam Constitutional:      General: He is not in acute distress. Pulmonary:     Effort: Pulmonary effort is normal.       Assessment & Plan     COVID  19 Infection Advised increase fluids, saline nasal sprays, OTC mucinex.  Discussed antiviral medications, rx molnupiravir d/t medication interaction CI to paxlovid.    Return if symptoms worsen or fail to improve.     I discussed the assessment and treatment plan with the patient. The patient was provided an opportunity to ask questions and all were answered. The patient agreed with the plan and demonstrated an understanding of the instructions.   The patient was advised to call back or seek an in-person  evaluation if the symptoms worsen or if the condition fails to improve as anticipated.  I provided 10 minutes of non-face-to-face time during this encounter.  I, Mikey Kirschner, PA-C have reviewed all documentation for this visit. The documentation on  03/06/2021  for the exam, diagnosis, procedures, and orders are all accurate and complete.   Mikey Kirschner, PA-C [facility] [phone] (phone) [phone] (fax)  [place]

## 2021-03-06 NOTE — Telephone Encounter (Signed)
Pt. Tested positive for COVID 19 with home test. Symptoms started yesterday - chills, joint pain, cough, runny nose. Requesting anti-viral medication. Virtual visit made per Arbie Cookey in the practice.    Answer Assessment - Initial Assessment Questions 1. COVID-19 DIAGNOSIS: "Who made your COVID-19 diagnosis?" "Was it confirmed by a positive lab test or self-test?" If not diagnosed by a doctor (or NP/PA), ask "Are there lots of cases (community spread) where you live?" Note: See public health department website, if unsure.     Home 2. COVID-19 EXPOSURE: "Was there any known exposure to COVID before the symptoms began?" CDC Definition of close contact: within 6 feet (2 meters) for a total of 15 minutes or more over a 24-hour period.      Yes 3. ONSET: "When did the COVID-19 symptoms start?"      Yesterday 4. WORST SYMPTOM: "What is your worst symptom?" (e.g., cough, fever, shortness of breath, muscle aches)     Chills 5. COUGH: "Do you have a cough?" If Yes, ask: "How bad is the cough?"       Cough 6. FEVER: "Do you have a fever?" If Yes, ask: "What is your temperature, how was it measured, and when did it start?"     Yes 7. RESPIRATORY STATUS: "Describe your breathing?" (e.g., shortness of breath, wheezing, unable to speak)      No 8. BETTER-SAME-WORSE: "Are you getting better, staying the same or getting worse compared to yesterday?"  If getting worse, ask, "In what way?"     Worse 9. HIGH RISK DISEASE: "Do you have any chronic medical problems?" (e.g., asthma, heart or lung disease, weak immune system, obesity, etc.)     No 10. VACCINE: "Have you had the COVID-19 vaccine?" If Yes, ask: "Which one, how many shots, when did you get it?"       N/a  11. BOOSTER: "Have you received your COVID-19 booster?" If Yes, ask: "Which one and when did you get it?"       N/a 12. PREGNANCY: "Is there any chance you are pregnant?" "When was your last menstrual period?"       N/a 13. OTHER SYMPTOMS: "Do you  have any other symptoms?"  (e.g., chills, fatigue, headache, loss of smell or taste, muscle pain, sore throat)       Chills, headache,cough, runny nose 14. O2 SATURATION MONITOR:  "Do you use an oxygen saturation monitor (pulse oximeter) at home?" If Yes, ask "What is your reading (oxygen level) today?" "What is your usual oxygen saturation reading?" (e.g., 95%)       No  Protocols used: Coronavirus (COVID-19) Diagnosed or Suspected-A-AH

## 2021-03-06 NOTE — Telephone Encounter (Signed)
Pt called in stating he has tested positive for covid and wants to speak with a nurse about what he needs to do rest, please advise.   Left message to call back about symptoms.

## 2021-03-12 ENCOUNTER — Telehealth: Payer: Federal, State, Local not specified - PPO | Admitting: Physician Assistant

## 2021-05-22 ENCOUNTER — Other Ambulatory Visit: Payer: Self-pay | Admitting: Family Medicine

## 2021-05-22 NOTE — Telephone Encounter (Signed)
Dose inconsistent with current med list.

## 2021-06-24 ENCOUNTER — Other Ambulatory Visit: Payer: Self-pay | Admitting: Family Medicine

## 2021-08-16 ENCOUNTER — Encounter: Payer: Self-pay | Admitting: Family Medicine

## 2021-08-16 ENCOUNTER — Ambulatory Visit: Payer: Federal, State, Local not specified - PPO | Admitting: Family Medicine

## 2021-08-16 VITALS — BP 138/72 | HR 72 | Temp 97.8°F | Ht 72.0 in | Wt 212.7 lb

## 2021-08-16 DIAGNOSIS — R7303 Prediabetes: Secondary | ICD-10-CM

## 2021-08-16 DIAGNOSIS — E7800 Pure hypercholesterolemia, unspecified: Secondary | ICD-10-CM

## 2021-08-16 DIAGNOSIS — I10 Essential (primary) hypertension: Secondary | ICD-10-CM

## 2021-08-16 NOTE — Progress Notes (Signed)
?  ?I,Elena D DeSanto,acting as a scribe for Lavon Paganini, MD.,have documented all relevant documentation on the behalf of Lavon Paganini, MD,as directed by  Lavon Paganini, MD while in the presence of Lavon Paganini, MD. ? ? ? ?Established patient visit ? ? ?Patient: Ruben Garcia   DOB: [birth year]   76 y.o. Male  MRN: [id number] ?Visit Date: 08/16/2021 ? ?Today's healthcare provider: Lavon Paganini, MD  ? ?Chief Complaint  ?Patient presents with  ? Hypertension  ? ?Subjective  ?  ?HPI  ?-Colonoscopy scheduled for July 10 ? ?Hypertension, follow-up ? ?BP Readings from Last 3 Encounters:  ?08/16/21 138/72  ?02/21/21 128/70  ?09/12/20 (!) 159/93  ? Wt Readings from Last 3 Encounters:  ?08/16/21 212 lb 11.2 oz (96.5 kg)  ?02/21/21 203 lb 4.8 oz (92.2 kg)  ?09/12/20 208 lb (94.3 kg)  ?  ? ?He was last seen for hypertension 6 months ago.  ?BP at that visit was as above. Management since that visit includes none. ? ?He reports good compliance with treatment. ?He is not having side effects.  ?He is following a Low Sodium diet. ?He is exercising. ?He does not smoke. ? ?Use of agents associated with hypertension: none.  ? ?Outside blood pressures are . ?Symptoms: ?No chest pain No chest pressure  ?No palpitations No syncope  ?No dyspnea No orthopnea  ?No paroxysmal nocturnal dyspnea No lower extremity edema  ? ?Pertinent labs ?Lab Results  ?Component Value Date  ? CHOL 188 02/21/2021  ? HDL 55 02/21/2021  ? LDLCALC 115 (H) 02/21/2021  ? TRIG 98 02/21/2021  ? CHOLHDL 3.7 02/16/2020  ? Lab Results  ?Component Value Date  ? NA 142 02/21/2021  ? K 5.3 (H) 02/21/2021  ? CREATININE 1.13 02/21/2021  ? EGFR 69 02/21/2021  ? GLUCOSE 110 (H) 02/21/2021  ?  ? ?The 10-year ASCVD risk score (Arnett DK, et al., 2019) is: 26.8% ? ?--------------------------------------------------------------------------------------------------- ?Prediabetes, Follow-up ? ?Lab Results  ?Component Value Date  ? HGBA1C 5.0 02/21/2021  ?  HGBA1C 5.3 02/16/2020  ? HGBA1C 5.1 08/11/2019  ? GLUCOSE 110 (H) 02/21/2021  ? GLUCOSE 105 (H) 02/16/2020  ? GLUCOSE 103 (H) 08/18/2019  ? ? ?Last seen for for this 6 months ago.  ?Management since that visit includes none. ?Current symptoms include none and have been unchanged. ? ?Prior visit with dietician: no ?Current diet: low salt ?Current exercise: walking ? ?Pertinent Labs: ?   ?Component Value Date/Time  ? CHOL 188 02/21/2021 1128  ? TRIG 98 02/21/2021 1128  ? CHOLHDL 3.7 02/16/2020 0911  ? CHOLHDL 3.3 02/03/2017 0952  ? CREATININE 1.13 02/21/2021 1128  ? CREATININE 1.11 02/03/2017 0952  ? ? ?Wt Readings from Last 3 Encounters:  ?08/16/21 212 lb 11.2 oz (96.5 kg)  ?02/21/21 203 lb 4.8 oz (92.2 kg)  ?09/12/20 208 lb (94.3 kg)  ? ?----------------------------------------------------------------------------------------- ? ? ?Medications: ?Outpatient Medications Prior to Visit  ?Medication Sig  ? aspirin EC 81 MG tablet Take 81 mg by mouth daily.  ? fluticasone (FLONASE) 50 MCG/ACT nasal spray Place 2 sprays into both nostrils daily.  ? Garlic 10 MG CAPS Take 10 mg by mouth daily.   ? ibuprofen (ADVIL,MOTRIN) 200 MG tablet Take 400 mg by mouth every 8 (eight) hours as needed (for pain.).  ? lisinopril (ZESTRIL) 40 MG tablet Take 1 tablet by mouth once daily  ? Multiple Vitamins-Minerals (MULTIVITAMIN PO) Take by mouth daily.  ? Omega-3 Fatty Acids (FISH OIL CONCENTRATE) 1000 MG CAPS Take 1,000  mg by mouth daily.   ? sildenafil (VIAGRA) 100 MG tablet Take 1 tablet (100 mg total) by mouth daily as needed for erectile dysfunction. Take two hours prior to intercourse on an empty stomach (Patient not taking: Reported on 03/06/2021)  ? simvastatin (ZOCOR) 20 MG tablet Take 1 tablet (20 mg total) by mouth at bedtime.  ? terbinafine (LAMISIL) 250 MG tablet Take 1 tablet (250 mg total) by mouth daily.  ? triamcinolone cream (KENALOG) 0.1 % Apply 1 application topically 2 (two) times daily.  ? vitamin E 400 UNIT capsule  Take 400 Units by mouth daily.  ? [DISCONTINUED] lisinopril (ZESTRIL) 20 MG tablet Take 1 tablet (20 mg total) by mouth daily.  ? ?No facility-administered medications prior to visit.  ? ? ?Review of Systems per HPI ? ? ?  Objective  ?  ?BP 138/72 (BP Location: Left Arm, Patient Position: Sitting, Cuff Size: Normal)   Pulse 72   Temp 97.8 ?F (36.6 ?C) (Oral)   Ht 6' (1.829 m)   Wt 212 lb 11.2 oz (96.5 kg)   SpO2 100%   BMI 28.85 kg/m?  ?BP Readings from Last 3 Encounters:  ?08/16/21 138/72  ?02/21/21 128/70  ?09/12/20 (!) 159/93  ? ?Wt Readings from Last 3 Encounters:  ?08/16/21 212 lb 11.2 oz (96.5 kg)  ?02/21/21 203 lb 4.8 oz (92.2 kg)  ?09/12/20 208 lb (94.3 kg)  ? ?  ? ?Physical Exam ?Vitals reviewed.  ?Constitutional:   ?   General: He is not in acute distress. ?   Appearance: Normal appearance. He is not diaphoretic.  ?HENT:  ?   Head: Normocephalic and atraumatic.  ?Eyes:  ?   General: No scleral icterus. ?   Conjunctiva/sclera: Conjunctivae normal.  ?Cardiovascular:  ?   Rate and Rhythm: Normal rate and regular rhythm.  ?   Pulses: Normal pulses.  ?   Heart sounds: Normal heart sounds. No murmur heard. ?Pulmonary:  ?   Effort: Pulmonary effort is normal. No respiratory distress.  ?   Breath sounds: Normal breath sounds. No wheezing or rhonchi.  ?Musculoskeletal:  ?   Cervical back: Neck supple.  ?   Right lower leg: No edema.  ?   Left lower leg: No edema.  ?Lymphadenopathy:  ?   Cervical: No cervical adenopathy.  ?Skin: ?   General: Skin is warm and dry.  ?Neurological:  ?   Mental Status: He is alert and oriented to person, place, and time.  ?Psychiatric:     ?   Mood and Affect: Mood normal.     ?   Behavior: Behavior normal.  ?  ? ? ?No results found for any visits on 08/16/21. ? Assessment & Plan  ?  ? ?Problem List Items Addressed This Visit   ? ?  ? Cardiovascular and Mediastinum  ? Essential (primary) hypertension - Primary  ?  Well controlled ?Continue current medications ?Recheck metabolic  panel ?F/u in 6 months  ?  ?  ? Relevant Orders  ? Comprehensive metabolic panel  ?  ? Other  ? Pure hypercholesterolemia  ?  Previously well controlled ?Continue statin ?Repeat FLP and CMP ?  ?  ? Relevant Orders  ? Comprehensive metabolic panel  ? Lipid panel  ? Prediabetes  ?  Last A1c in normal range but still hyperglycemic ?Continue to monitor A1c ? ?  ?  ? Relevant Orders  ? Hemoglobin A1c  ?  ? ?Return in about 6 months (around 02/15/2022) for  CPE.  ?   ? ?I, Lavon Paganini, MD, have reviewed all documentation for this visit. The documentation on 08/16/21 for the exam, diagnosis, procedures, and orders are all accurate and complete. ? ? ?Virginia Crews, MD, MPH ?[place] ?[facility]   ?

## 2021-08-16 NOTE — Assessment & Plan Note (Signed)
Well controlled Continue current medications Recheck metabolic panel F/u in 6 months  

## 2021-08-16 NOTE — Assessment & Plan Note (Signed)
Last A1c in normal range but still hyperglycemic ?Continue to monitor A1c ?

## 2021-08-16 NOTE — Assessment & Plan Note (Signed)
Previously well controlled Continue statin Repeat FLP and CMP  

## 2021-08-17 LAB — LIPID PANEL
Chol/HDL Ratio: 3.2 ratio (ref 0.0–5.0)
Cholesterol, Total: 172 mg/dL (ref 100–199)
HDL: 54 mg/dL (ref >39)
LDL Chol Calc (NIH): 91 mg/dL (ref 0–99)
Triglycerides: 159 mg/dL — ABNORMAL HIGH (ref 0–149)
VLDL Cholesterol Cal: 27 mg/dL (ref 5–40)

## 2021-08-17 LAB — HEMOGLOBIN A1C
Est. average glucose Bld gHb Est-mCnc: 105 mg/dL
Hgb A1c MFr Bld: 5.3 % (ref 4.8–5.6)

## 2021-08-17 LAB — COMPREHENSIVE METABOLIC PANEL
ALT: 30 IU/L (ref 0–44)
AST: 27 IU/L (ref 0–40)
Albumin/Globulin Ratio: 2 (ref 1.2–2.2)
Albumin: 4.6 g/dL (ref 3.7–4.7)
Alkaline Phosphatase: 45 IU/L (ref 44–121)
BUN/Creatinine Ratio: 10 (ref 10–24)
BUN: 12 mg/dL (ref 8–27)
Bilirubin Total: 1.2 mg/dL (ref 0.0–1.2)
CO2: 23 mmol/L (ref 20–29)
Calcium: 9.7 mg/dL (ref 8.6–10.2)
Chloride: 105 mmol/L (ref 96–106)
Creatinine, Ser: 1.17 mg/dL (ref 0.76–1.27)
Globulin, Total: 2.3 g/dL (ref 1.5–4.5)
Glucose: 104 mg/dL — ABNORMAL HIGH (ref 70–99)
Potassium: 5 mmol/L (ref 3.5–5.2)
Sodium: 142 mmol/L (ref 134–144)
Total Protein: 6.9 g/dL (ref 6.0–8.5)
eGFR: 66 mL/min/{1.73_m2} (ref >59)

## 2021-08-23 ENCOUNTER — Ambulatory Visit: Payer: Federal, State, Local not specified - PPO | Admitting: Family Medicine

## 2021-08-27 ENCOUNTER — Telehealth: Payer: Self-pay

## 2021-08-27 NOTE — Telephone Encounter (Signed)
Copied from [place] [id number]. Topic: General - Other ?>> Aug 27, 2021 10:38 AM Yvette Rack wrote: ?Reason for CRM: Pt requests a copy of most recent lab results be printed out. Pt requests call back once lab results are ready to be picked up. Cb# [phone] ?

## 2021-08-27 NOTE — Telephone Encounter (Signed)
Patient aware. Coy of lab results placed up front ready for pick up. ?

## 2021-09-12 ENCOUNTER — Ambulatory Visit: Payer: Self-pay | Admitting: Urology

## 2021-09-19 ENCOUNTER — Telehealth: Payer: Self-pay | Admitting: Urology

## 2021-09-19 ENCOUNTER — Ambulatory Visit: Payer: Federal, State, Local not specified - PPO | Admitting: Urology

## 2021-09-19 ENCOUNTER — Encounter: Payer: Self-pay | Admitting: Urology

## 2021-09-19 VITALS — BP 148/89 | HR 70 | Ht 72.0 in | Wt 212.0 lb

## 2021-09-19 DIAGNOSIS — N401 Enlarged prostate with lower urinary tract symptoms: Secondary | ICD-10-CM | POA: Diagnosis not present

## 2021-09-19 DIAGNOSIS — N138 Other obstructive and reflux uropathy: Secondary | ICD-10-CM

## 2021-09-19 DIAGNOSIS — N529 Male erectile dysfunction, unspecified: Secondary | ICD-10-CM

## 2021-09-19 NOTE — Progress Notes (Signed)
09/19/21 9:30 AM   Ruben Garcia 1945-09-08 [id number]  Referring provider:  Virginia Crews, [address] [facility] [facility] [address] [place],  [place] [address]  Urological history  1. BPH with LU TS -PSA 0.4 in 01/2021 -I PSS 5/0   2. ED -contributing factors of age, BPH, HLD, alcohol and HTN -SHIM 14 -managed with sildenafil 100 mg, on-demand-dosing    Chief Complaint  Patient presents with   Erectile Dysfunction   Follow-up    HPI: Ruben Garcia is a 76 y.o.male who presents today for a 1 year follow-up with IPSS, SHIM, and exam.   He has no urinary complaints.  Patient denies any modifying or aggravating factors.  Patient denies any gross hematuria, dysuria or suprapubic/flank pain.  Patient denies any fevers, chills, nausea or vomiting.     IPSS     Row Name 09/19/21 0800         International Prostate Symptom Score   How often have you had the sensation of not emptying your bladder? Less than 1 in 5     How often have you had to urinate less than every two hours? Less than 1 in 5 times     How often have you found you stopped and started again several times when you urinated? Less than 1 in 5 times     How often have you found it difficult to postpone urination? Not at All     How often have you had a weak urinary stream? Less than 1 in 5 times     How often have you had to strain to start urination? Not at All     How many times did you typically get up at night to urinate? 1 Time     Total IPSS Score 5       Quality of Life due to urinary symptoms   If you were to spend the rest of your life with your urinary condition just the way it is now how would you feel about that? Delighted              Score:  1-7 Mild 8-19 Moderate 20-35 Severe   Patient still having spontaneous erections.  He denies any pain or curvature with erections.   He is taking an herbal supplement that is working okay for him.    SHIM     Row Name 09/19/21 0849         SHIM:  Over the last 6 months:   How do you rate your confidence that you could get and keep an erection? Moderate     When you had erections with sexual stimulation, how often were your erections hard enough for penetration (entering your partner)? Sometimes (about half the time)     During sexual intercourse, how often were you able to maintain your erection after you had penetrated (entered) your partner? A Few Times (much less than half the time)     During sexual intercourse, how difficult was it to maintain your erection to completion of intercourse? Difficult     When you attempted sexual intercourse, how often was it satisfactory for you? Sometimes (about half the time)       SHIM Total Score   SHIM 14                   PMH: Past Medical History:  Diagnosis Date   Allergic rhinitis, cause unspecified    Basal cell carcinoma of skin, site unspecified  04/28/2010   Nasal; [name].   BPH (benign prostatic hypertrophy)    Essential hypertension, benign    Hemorrhoids, internal    Incomplete bladder emptying    Other abnormal glucose    Over weight    Personal history of colonic polyps    Personal history of other genital system and obstetric disorders(V13.29)    Pure hypercholesterolemia    Unspecified disorder of kidney and ureter    Unspecified disorder of liver     Surgical History: Past Surgical History:  Procedure Laterality Date   BASAL CELL CARCINOMA EXCISION  2012   Facial        [place]   COLONOSCOPY  11/13/2010   single polyp; repeatin 5 years; [place]   COLONOSCOPY WITH PROPOFOL N/A 12/13/2015   Procedure: COLONOSCOPY WITH PROPOFOL;  Surgeon: [name], MD;  Location: [place];  Service: Endoscopy;  Laterality: N/A;   ear surgery  05/2012   [name].  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  Benign.   [place]   rotator cuff surgery Right 05/14/2016   VASECTOMY  1978    Home Medications:  Allergies  as of 09/19/2021       Reactions   Codeine Nausea Only        Medication List        Accurate as of Sep 19, 2021  9:30 AM. If you have any questions, ask your nurse or doctor.          aspirin EC 81 MG tablet Take 81 mg by mouth daily.   Fish Oil Concentrate 1000 MG Caps Take 1,000 mg by mouth daily.   fluticasone 50 MCG/ACT nasal spray Commonly known as: FLONASE Place 2 sprays into both nostrils daily.   Garlic 10 MG Caps Take 10 mg by mouth daily.   hydrocortisone 2.5 % cream SMARTSIG:1 Topical Daily   ibuprofen 200 MG tablet Commonly known as: ADVIL Take 400 mg by mouth every 8 (eight) hours as needed (for pain.).   lisinopril 40 MG tablet Commonly known as: ZESTRIL Take 1 tablet by mouth once daily   MULTIVITAMIN PO Take by mouth daily.   sildenafil 100 MG tablet Commonly known as: VIAGRA Take 1 tablet (100 mg total) by mouth daily as needed for erectile dysfunction. Take two hours prior to intercourse on an empty stomach   simvastatin 20 MG tablet Commonly known as: ZOCOR Take 1 tablet (20 mg total) by mouth at bedtime.   terbinafine 250 MG tablet Commonly known as: LAMISIL Take 1 tablet (250 mg total) by mouth daily.   triamcinolone cream 0.1 % Commonly known as: KENALOG Apply 1 application topically 2 (two) times daily.   vitamin E 180 MG (400 UNITS) capsule Take 400 Units by mouth daily.        Allergies:  Allergies  Allergen Reactions   Codeine Nausea Only    Family History: Family History  Problem Relation Age of Onset   Heart disease Mother        first MI in 86s   Diabetes Mother    Hyperlipidemia Mother    Arthritis Mother        rheumatoid   Cancer Father        lung   Diabetes Father    Arthritis Sister    Hyperlipidemia Sister    Hyperlipidemia Sister    Hypertension Sister    Hyperlipidemia Sister    Kidney disease Neg Hx  Prostate cancer Neg Hx    Colon cancer Neg Hx     Social History:  reports that he  has never smoked. He has never used smokeless tobacco. He reports current alcohol use of about 5.0 standard drinks per week. He reports that he does not use drugs.   Physical Exam: BP (!) 148/89   Pulse 70   Ht 6' (1.829 m)   Wt 212 lb (96.2 kg)   BMI 28.75 kg/m   Constitutional:  Well nourished. Alert and oriented, No acute distress. HEENT: [place] AT, moist mucus membranes.  Trachea midline Cardiovascular: No clubbing, cyanosis, or edema. Respiratory: Normal respiratory effort, no increased work of breathing. GU: No CVA tenderness.  No bladder fullness or masses.  Patient with uncircumcised phallus. Foreskin easily retracted  Urethral meatus is patent.  No penile discharge. No penile lesions or rashes. Scrotum without lesions, cysts, rashes and/or edema.  Testicles are located scrotally bilaterally. No masses are appreciated in the testicles. Left and right epididymis are normal. Rectal: Patient with  normal sphincter tone. Anus and perineum without scarring or rashes. No rectal masses are appreciated. Prostate is approximately 50 grams, no nodules are appreciated. Seminal vesicles could not be palpated Neurologic: Grossly intact, no focal deficits, moving all 4 extremities. Psychiatric: Normal mood and affect.   Laboratory Data: Lab Results  Component Value Date   CREATININE 1.17 08/16/2021   Lab Results  Component Value Date   HGBA1C 5.3 08/16/2021   Component     Latest Ref Rng 02/21/2021  Prostate Specific Ag, Serum     0.0 - 4.0 ng/mL 0.4     Component     Latest Ref Rng 08/16/2021  Glucose     70 - 99 mg/dL 104 (H)   BUN     8 - 27 mg/dL 12   Creatinine     0.76 - 1.27 mg/dL 1.17   BUN/Creatinine Ratio     10 - 24  10   Sodium     134 - 144 mmol/L 142   Potassium     3.5 - 5.2 mmol/L 5.0   Chloride     96 - 106 mmol/L 105   CO2     20 - 29 mmol/L 23   Calcium     8.6 - 10.2 mg/dL 9.7   Total Protein     6.0 - 8.5 g/dL 6.9   Total Bilirubin     0.0 - 1.2  mg/dL 1.2   AST     0 - 40 IU/L 27   ALT     0 - 44 IU/L 30   Albumin     3.7 - 4.7 g/dL 4.6   Globulin, Total     1.5 - 4.5 g/dL 2.3   Albumin/Globulin Ratio     1.2 - 2.2  2.0   Alkaline Phosphatase     44 - 121 IU/L 45   eGFR     >59 mL/min/1.73 66     Component     Latest Ref Rng 08/16/2021  HDL Cholesterol     >39 mg/dL 54   Triglycerides     0 - 149 mg/dL 159 (H)   Total CHOL/HDL Ratio     0.0 - 5.0 ratio 3.2   Cholesterol, Total     100 - 199 mg/dL 172   VLDL Cholesterol Cal     5 - 40 mg/dL 27   LDL Chol Calc (NIH)     0 - 99  mg/dL 91   I have reviewed the labs.      Pertinent Imaging: N/A  Assessment & Plan:    1. BPH with LUTS -PSA stable -DRE benign -continue conservative management, avoiding bladder irritants and timed voiding's  2. ED -He is taking an over-the-counter herbal supplement which is working well for him he cannot recall the name at this time    Return in about 1 year (around 09/20/2022) for IPSS, SHIM, PSA and exam.  [facility] [address] [place], [place] [address] [phone]  I, Kirke Shaggy Littlejohn,acting as a scribe for [facility], PA-C.,have documented all relevant documentation on the behalf of Nikaya Nasby, PA-C,as directed by  [facility], PA-C while in the presence of [place], PA-C.  I have reviewed the above documentation for accuracy and completeness, and I agree with the above.    Zara Council, PA-C

## 2021-10-16 NOTE — Telephone Encounter (Signed)
Error

## 2021-11-01 ENCOUNTER — Encounter: Payer: Self-pay | Admitting: *Deleted

## 2021-11-04 ENCOUNTER — Encounter
Admission: RE | Disposition: A | Discharge status: Home / Self Care | Payer: Self-pay | Source: Home / Self Care | Attending: Gastroenterology

## 2021-11-04 ENCOUNTER — Ambulatory Visit: Payer: No Typology Code available for payment source | Admitting: Anesthesiology

## 2021-11-04 ENCOUNTER — Ambulatory Visit
Admission: RE | Admit: 2021-11-04 | Discharge: 2021-11-04 | Disposition: A | Discharge status: Home / Self Care | Payer: No Typology Code available for payment source | Attending: Gastroenterology | Admitting: Gastroenterology

## 2021-11-04 DIAGNOSIS — K6289 Other specified diseases of anus and rectum: Secondary | ICD-10-CM | POA: Diagnosis not present

## 2021-11-04 DIAGNOSIS — Z1211 Encounter for screening for malignant neoplasm of colon: Secondary | ICD-10-CM | POA: Insufficient documentation

## 2021-11-04 DIAGNOSIS — Z86010 Personal history of colonic polyps: Secondary | ICD-10-CM | POA: Diagnosis not present

## 2021-11-04 DIAGNOSIS — D123 Benign neoplasm of transverse colon: Secondary | ICD-10-CM | POA: Diagnosis not present

## 2021-11-04 HISTORY — DX: Polyp of colon: K63.5

## 2021-11-04 HISTORY — PX: COLONOSCOPY: SHX5424

## 2021-11-04 SURGERY — COLONOSCOPY
Anesthesia: General

## 2021-11-04 MED ORDER — SODIUM CHLORIDE 0.9 % IV SOLN
INTRAVENOUS | Status: DC
Start: 2021-11-04 — End: 2021-11-04
  Administered 2021-11-04: 20 mL/h via INTRAVENOUS

## 2021-11-04 MED ORDER — PROPOFOL 500 MG/50ML IV EMUL
INTRAVENOUS | Status: DC | PRN
  Administered 2021-11-04: 70 mg via INTRAVENOUS
  Administered 2021-11-04: 120 ug/kg/min via INTRAVENOUS

## 2021-11-04 NOTE — H&P (Signed)
Outpatient short stay form Pre-procedure 11/04/2021  Lesly Rubenstein, MD  Primary Physician: Virginia Crews, MD  Reason for visit:  Surveillance  History of present illness:    76 y/o gentleman with history of hypertension here for surveillance colonoscopy. No blood thinners. No family history of GI malignancies. Last colonoscopy was 5 years ago. No significant abdominal surgeries.    Current Facility-Administered Medications:    0.9 %  sodium chloride infusion, , Intravenous, Continuous, Azalee Weimer, Hilton Cork, MD, Last Rate: 20 mL/hr at 11/04/21 1050, 20 mL/hr at 11/04/21 1050  Medications Prior to Admission  Medication Sig Dispense Refill Last Dose   aspirin EC 81 MG tablet Take 81 mg by mouth daily.   11/03/2021   Cyanocobalamin (VITAMIN B 12) 500 MCG TABS Take 100 mg by mouth daily at 6 (six) AM.   11/03/2021   fluticasone (FLONASE) 50 MCG/ACT nasal spray Place 2 sprays into both nostrils daily. 16 g 6 06/07/7987   Garlic 10 MG CAPS Take 10 mg by mouth daily.    11/03/2021   hydrocortisone 2.5 % cream SMARTSIG:1 Topical Daily   11/03/2021   lisinopril (ZESTRIL) 40 MG tablet Take 1 tablet by mouth once daily 90 tablet 1 11/04/2021 at 0600   meloxicam (MOBIC) 15 MG tablet Take 15 mg by mouth daily.      Multiple Vitamins-Minerals (MULTIVITAMIN PO) Take by mouth daily.   11/03/2021   Omega-3 Fatty Acids (FISH OIL CONCENTRATE) 1000 MG CAPS Take 1,000 mg by mouth daily.    11/03/2021   sildenafil (VIAGRA) 100 MG tablet Take 1 tablet (100 mg total) by mouth daily as needed for erectile dysfunction. Take two hours prior to intercourse on an empty stomach 90 tablet 1 Past Week   simvastatin (ZOCOR) 20 MG tablet Take 1 tablet (20 mg total) by mouth at bedtime. 90 tablet 3 11/03/2021   terbinafine (LAMISIL) 250 MG tablet Take 1 tablet (250 mg total) by mouth daily. 30 tablet 0 11/03/2021   triamcinolone cream (KENALOG) 0.1 % Apply 1 application topically 2 (two) times daily. 30 g 0 11/03/2021   vitamin E  400 UNIT capsule Take 400 Units by mouth daily.   11/03/2021   ibuprofen (ADVIL,MOTRIN) 200 MG tablet Take 400 mg by mouth every 8 (eight) hours as needed (for pain.).        Allergies  Allergen Reactions   Codeine Nausea Only     Past Medical History:  Diagnosis Date   Allergic rhinitis, cause unspecified    Basal cell carcinoma of skin, site unspecified 04/28/2010   Nasal; [name].   BPH (benign prostatic hypertrophy)    Colon polyps    Essential hypertension, benign    Hemorrhoids, internal    Incomplete bladder emptying    Other abnormal glucose    Over weight    Personal history of colonic polyps    Personal history of other genital system and obstetric disorders(V13.29)    Pure hypercholesterolemia    Unspecified disorder of kidney and ureter    Unspecified disorder of liver     Review of systems:  Otherwise negative.    Physical Exam  Gen: Alert, oriented. Appears stated age.  HEENT: PERRLA. Lungs: No respiratory distress CV: RRR Abd: soft, benign, no masses Ext: No edema    Planned procedures: Proceed with colonoscopy. The patient understands the nature of the planned procedure, indications, risks, alternatives and potential complications including but not limited to bleeding, infection, perforation, damage to internal organs and possible oversedation/side  effects from anesthesia. The patient agrees and gives consent to proceed.  Please refer to procedure notes for findings, recommendations and patient disposition/instructions.     Lesly Rubenstein, MD [facility]

## 2021-11-04 NOTE — Anesthesia Preprocedure Evaluation (Signed)
Anesthesia Evaluation  Patient identified by MRN, date of birth, ID band Patient awake    Reviewed: Allergy & Precautions, NPO status , Patient's Chart, lab work & pertinent test results  History of Anesthesia Complications Negative for: history of anesthetic complications  Airway Mallampati: III  TM Distance: <3 FB Neck ROM: full    Dental  (+) Chipped   Pulmonary neg pulmonary ROS, neg shortness of breath,    Pulmonary exam normal        Cardiovascular hypertension, (-) angina(-) Past MI Normal cardiovascular exam     Neuro/Psych negative neurological ROS  negative psych ROS   GI/Hepatic negative GI ROS, Neg liver ROS, neg GERD  ,  Endo/Other  negative endocrine ROS  Renal/GU Renal disease  negative genitourinary   Musculoskeletal   Abdominal   Peds  Hematology negative hematology ROS (+)   Anesthesia Other Findings Past Medical History: No date: Allergic rhinitis, cause unspecified 04/28/2010: Basal cell carcinoma of skin, site unspecified     Comment:  Nasal; [place]. No date: BPH (benign prostatic hypertrophy) No date: Colon polyps No date: Essential hypertension, benign No date: Hemorrhoids, internal No date: Incomplete bladder emptying No date: Other abnormal glucose No date: Over weight No date: Personal history of colonic polyps No date: Personal history of other genital system and obstetric  disorders(V13.29) No date: Pure hypercholesterolemia No date: Unspecified disorder of kidney and ureter No date: Unspecified disorder of liver  Past Surgical History: 2012: BASAL CELL CARCINOMA EXCISION     Comment:  Facial        [place] 11/13/2010: COLONOSCOPY     Comment:  single polyp; repeatin 5 years; Vira Agar 12/13/2015: COLONOSCOPY WITH PROPOFOL; N/A     Comment:  Procedure: COLONOSCOPY WITH PROPOFOL;  Surgeon: [name], MD;  Location: [place];  Service:                Endoscopy;  Laterality: N/A; 05/2012: ear surgery     Comment:  [name].  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  Benign. 1978: HEMORRHOID SURGERY     Comment:  [name] 1982: LAPAROSCOPIC CHOLECYSTECTOMY 05/14/2016: rotator cuff surgery; Right 1978: VASECTOMY  BMI    Body Mass Index: 29.16 kg/m      Reproductive/Obstetrics negative OB ROS                             Anesthesia Physical Anesthesia Plan  ASA: 3  Anesthesia Plan: General   Post-op Pain Management:    Induction: Intravenous  PONV Risk Score and Plan: Propofol infusion and TIVA  Airway Management Planned: Natural Airway and Nasal Cannula  Additional Equipment:   Intra-op Plan:   Post-operative Plan:   Informed Consent: I have reviewed the patients History and Physical, chart, labs and discussed the procedure including the risks, benefits and alternatives for the proposed anesthesia with the patient or authorized representative who has indicated his/her understanding and acceptance.     Dental Advisory Given  Plan Discussed with: Anesthesiologist, CRNA and Surgeon  Anesthesia Plan Comments: (Patient consented for risks of anesthesia including but not limited to:  - adverse reactions to medications - risk of airway placement if required - damage to eyes, teeth, lips or other oral mucosa - nerve damage due to positioning  - sore throat or hoarseness - Damage to heart, brain, nerves, lungs, other parts of body or loss of life  Patient voiced understanding.)        Anesthesia Quick Evaluation

## 2021-11-04 NOTE — Op Note (Signed)
[facility] Gastroenterology Patient Name: Ruben Garcia Procedure Date: 11/04/2021 11:01 AM MRN: [id number] Account #: [id number] Date of Birth: 10/12/1945 Admit Type: Outpatient Age: 76 Room: [facility] ENDO ROOM 3 Gender: Male Note Status: Finalized Instrument Name: Jasper Riling [id number] Procedure:             Colonoscopy Indications:           High risk colon cancer surveillance: Personal history                         of non-advanced adenoma, Last colonoscopy 5 years ago Providers:             Andrey Farmer MD, MD Medicines:             Monitored Anesthesia Care Complications:         No immediate complications. Estimated blood loss:                         Minimal. Procedure:             Pre-Anesthesia Assessment:                        - Prior to the procedure, a History and Physical was                         performed, and patient medications and allergies were                         reviewed. The patient is competent. The risks and                         benefits of the procedure and the sedation options and                         risks were discussed with the patient. All questions                         were answered and informed consent was obtained.                         Patient identification and proposed procedure were                         verified by the physician, the nurse, the                         anesthesiologist, the anesthetist and the technician                         in the endoscopy suite. Mental Status Examination:                         alert and oriented. Airway Examination: normal                         oropharyngeal airway and neck mobility. Respiratory                         Examination: clear to auscultation. CV Examination:  normal. Prophylactic Antibiotics: The patient does not                         require prophylactic antibiotics. Prior                         Anticoagulants: The patient has  taken no previous                         anticoagulant or antiplatelet agents. ASA Grade                         Assessment: II - A patient with mild systemic disease.                         After reviewing the risks and benefits, the patient                         was deemed in satisfactory condition to undergo the                         procedure. The anesthesia plan was to use monitored                         anesthesia care (MAC). Immediately prior to                         administration of medications, the patient was                         re-assessed for adequacy to receive sedatives. The                         heart rate, respiratory rate, oxygen saturations,                         blood pressure, adequacy of pulmonary ventilation, and                         response to care were monitored throughout the                         procedure. The physical status of the patient was                         re-assessed after the procedure.                        After obtaining informed consent, the colonoscope was                         passed under direct vision. Throughout the procedure,                         the patient's blood pressure, pulse, and oxygen                         saturations were monitored continuously. The  Colonoscope was introduced through the anus and                         advanced to the the cecum, identified by appendiceal                         orifice and ileocecal valve. The colonoscopy was                         performed without difficulty. The patient tolerated                         the procedure well. The quality of the bowel                         preparation was good. Findings:      The perianal and digital rectal examinations were normal.      A 5 mm polyp was found in the hepatic flexure. The polyp was sessile.       The polyp was removed with a cold snare. Resection and retrieval were       complete.  Estimated blood loss was minimal.      A 3 mm polyp was found in the descending colon. The polyp was sessile.       The polyp was removed with a cold snare. Resection and retrieval were       complete. Estimated blood loss was minimal.      Anal papilla(e) were hypertrophied.      The exam was otherwise without abnormality on direct and retroflexion       views. Impression:            - One 5 mm polyp at the hepatic flexure, removed with                         a cold snare. Resected and retrieved.                        - One 3 mm polyp in the descending colon, removed with                         a cold snare. Resected and retrieved.                        - Anal papilla(e) were hypertrophied.                        - The examination was otherwise normal on direct and                         retroflexion views. Recommendation:        - Discharge patient to home.                        - Resume previous diet.                        - Continue present medications.                        - Await pathology results.                        -  Repeat colonoscopy is not recommended due to current                         age (50 years or older) for surveillance.                        - Return to referring physician as previously                         scheduled. Procedure Code(s):     --- Professional ---                        [phone], Colonoscopy, flexible; with removal of                         tumor(s), polyp(s), or other lesion(s) by snare                         technique Diagnosis Code(s):     --- Professional ---                        Z86.010, Personal history of colonic polyps                        K63.5, Polyp of colon                        K62.89, Other specified diseases of anus and rectum CPT copyright 2019 American Medical Association. All rights reserved. The codes documented in this report are preliminary and upon coder review may  be revised to meet current compliance  requirements. Andrey Farmer MD, MD 11/04/2021 11:35:22 AM Number of Addenda: 0 Note Initiated On: 11/04/2021 11:01 AM Scope Withdrawal Time: 0 hours 12 minutes 43 seconds  Total Procedure Duration: 0 hours 18 minutes 31 seconds  Estimated Blood Loss:  Estimated blood loss was minimal.      [facility]

## 2021-11-04 NOTE — Anesthesia Postprocedure Evaluation (Signed)
Anesthesia Post Note  Patient: Ruben Garcia  Procedure(s) Performed: COLONOSCOPY  Patient location during evaluation: Endoscopy Anesthesia Type: General Level of consciousness: awake and alert Pain management: pain level controlled Vital Signs Assessment: post-procedure vital signs reviewed and stable Respiratory status: spontaneous breathing, nonlabored ventilation, respiratory function stable and patient connected to nasal cannula oxygen Cardiovascular status: blood pressure returned to baseline and stable Postop Assessment: no apparent nausea or vomiting Anesthetic complications: no   No notable events documented.   Last Vitals:  Vitals:   11/04/21 1156 11/04/21 1206  BP: 99/66 135/72  Pulse: 64 61  Resp: 14 13  Temp:    SpO2: 97% 99%    Last Pain:  Vitals:   11/04/21 1206  TempSrc:   PainSc: 0-No pain                 Precious Haws Kristopher Attwood

## 2021-11-04 NOTE — Interval H&P Note (Signed)
History and Physical Interval Note:  11/04/2021 11:03 AM  Ruben Garcia  has presented today for surgery, with the diagnosis of History of colon polyps (Z86.010).  The various methods of treatment have been discussed with the patient and family. After consideration of risks, benefits and other options for treatment, the patient has consented to  Procedure(s) with comments: COLONOSCOPY (N/A) - REQUEST EARLY TIME as a surgical intervention.  The patient's history has been reviewed, patient examined, no change in status, stable for surgery.  I have reviewed the patient's chart and labs.  Questions were answered to the patient's satisfaction.     Lesly Rubenstein  Ok to proceed with colonoscopy

## 2021-11-04 NOTE — Transfer of Care (Signed)
Immediate Anesthesia Transfer of Care Note  Patient: Ruben Garcia  Procedure(s) Performed: COLONOSCOPY  Patient Location: PACU  Anesthesia Type:General  Level of Consciousness: awake and alert   Airway & Oxygen Therapy: Patient Spontanous Breathing and Patient connected to nasal cannula oxygen  Post-op Assessment: Report given to RN and Post -op Vital signs reviewed and stable  Post vital signs: Reviewed and stable  Last Vitals:  Vitals Value Taken Time  BP 76/62 11/04/21 1140  Temp 36.2 C 11/04/21 1136  Pulse 69 11/04/21 1141  Resp 15 11/04/21 1141  SpO2 94 % 11/04/21 1141  Vitals shown include unvalidated device data.  Last Pain:  Vitals:   11/04/21 1136  TempSrc: Temporal  PainSc: Asleep         Complications: No notable events documented.

## 2021-11-05 ENCOUNTER — Encounter: Payer: Self-pay | Admitting: Gastroenterology

## 2021-11-05 LAB — SURGICAL PATHOLOGY

## 2021-12-30 ENCOUNTER — Other Ambulatory Visit: Payer: Self-pay | Admitting: Family Medicine

## 2021-12-30 DIAGNOSIS — E7800 Pure hypercholesterolemia, unspecified: Secondary | ICD-10-CM

## 2022-01-01 ENCOUNTER — Telehealth: Payer: Self-pay | Admitting: Family Medicine

## 2022-01-01 NOTE — Telephone Encounter (Signed)
Pt is going on a cruise in October 5th and wanted to know if provider can send Rx for the patches that you wear behind your ear for motion sickness/ please advise and send to [facility] on [address]

## 2022-01-01 NOTE — Telephone Encounter (Signed)
Please Review and advise 

## 2022-01-02 NOTE — Telephone Encounter (Signed)
LMTCB-ok for [facility] Nurse to advise

## 2022-01-02 NOTE — Telephone Encounter (Signed)
Pt called back, was advised of Dr. Quentin Cornwall message. Pt states he went to [facility] and [facility] and they dont have them in store to purchase and doesn't want to pay crazy price online for them. Pt is asking if an rx can just be sent in so it can be cheaper but if not he will try to do something different. I looked online for him and seen several on [place] but pt doesn't have [facility] account. I told him I would send back for review for an rx and we would fu.

## 2022-01-03 ENCOUNTER — Other Ambulatory Visit: Payer: Self-pay | Admitting: Family Medicine

## 2022-01-03 MED ORDER — SCOPOLAMINE 1 MG/3DAYS TD PT72: 1.0000 | MEDICATED_PATCH | TRANSDERMAL | 0 refills | Status: DC

## 2022-01-03 NOTE — Telephone Encounter (Signed)
RX sent for 5 patches   Eulis Foster, MD  [facility]  [phone]

## 2022-02-12 ENCOUNTER — Other Ambulatory Visit: Payer: Self-pay | Admitting: Family Medicine

## 2022-02-12 DIAGNOSIS — E7800 Pure hypercholesterolemia, unspecified: Secondary | ICD-10-CM

## 2022-02-12 NOTE — Telephone Encounter (Signed)
Pt has newer rx at same pharm. Requested Prescriptions  Pending Prescriptions Disp Refills  . simvastatin (ZOCOR) 20 MG tablet [Pharmacy Med Name: Simvastatin 20 MG Oral Tablet] 90 tablet 0    Sig: TAKE 1 TABLET BY MOUTH AT BEDTIME     Cardiovascular:  Antilipid - Statins Failed - 02/12/2022  5:04 PM      Failed - Lipid Panel in normal range within the last 12 months    Cholesterol, Total  Date Value Ref Range Status  08/16/2021 172 100 - 199 mg/dL Final   LDL Cholesterol (Calc)  Date Value Ref Range Status  02/03/2017 90 mg/dL (calc) Final    Comment:    Reference range: <100 . Desirable range <100 mg/dL for primary prevention;   <70 mg/dL for patients with CHD or diabetic patients  with > or = 2 CHD risk factors. [name] Kitchen LDL-C is now calculated using the Martin-Hopkins  calculation, which is a validated novel method providing  better accuracy than the Friedewald equation in the  estimation of LDL-C.  Cresenciano Genre et al. Annamaria Helling. 6789;381(01): 2061-2068  (http://education.[employer].com/faq/FAQ164)    LDL Chol Calc (NIH)  Date Value Ref Range Status  08/16/2021 91 0 - 99 mg/dL Final   HDL  Date Value Ref Range Status  08/16/2021 54 >39 mg/dL Final   Triglycerides  Date Value Ref Range Status  08/16/2021 159 (H) 0 - 149 mg/dL Final         Passed - Patient is not pregnant      Passed - Valid encounter within last 12 months    Recent Outpatient Visits          6 months ago Essential (primary) hypertension   [facility], Dionne Bucy, MD   11 months ago [place] Thedore Mins, [place], PA-C   11 months ago Encounter for annual physical exam   [facility], Dionne Bucy, MD   1 year ago Encounter for annual physical exam   [facility], Dionne Bucy, MD   2 years ago [place], Dionne Bucy, MD      Future Appointments            In 1 week  [name], Dionne Bucy, MD [facility] [place]   In 7 months [place], Shannon A, [place]

## 2022-02-24 NOTE — Progress Notes (Unsigned)
 I,Sulibeya S Dimas,acting as a scribe for Angela [name], MD.,have documented all relevant documentation on the behalf of Angela [name], MD,as directed by  Angela [name], MD while in the presence of Angela [name], MD.    Complete physical exam   Patient: Ruben Garcia   DOB: 08/01/1945   76 y.o. Male  MRN: [id number] Visit Date: 02/25/2022  Today's healthcare provider: Angela [name], MD   Chief Complaint  Patient presents with   Annual Exam   Subjective    [name] is a 76 y.o. male who presents today for a complete physical exam.  He reports consuming a general diet.  walking  He generally feels well. He reports sleeping well. He does not have additional problems to discuss today.  HPI    Past Medical History:  Diagnosis Date   Allergic rhinitis, cause unspecified    Basal cell carcinoma of skin, site unspecified 04/28/2010   Nasal; [place].   BPH (benign prostatic hypertrophy)    Colon polyps    Essential hypertension, benign    Hemorrhoids, internal    Incomplete bladder emptying    Other abnormal glucose    Over weight    Personal history of colonic polyps    Personal history of other genital system and obstetric disorders(V13.29)    Pure hypercholesterolemia    Unspecified disorder of kidney and ureter    Unspecified disorder of liver    Past Surgical History:  Procedure Laterality Date   BASAL CELL CARCINOMA EXCISION  2012   Facial        [place]   COLONOSCOPY  11/13/2010   single polyp; repeatin 5 years; [place]   COLONOSCOPY N/A 11/04/2021   Procedure: COLONOSCOPY;  Surgeon: Locklear, Cameron T, MD;  Location: [facility] ENDOSCOPY;  Service: Endoscopy;  Laterality: N/A;  REQUEST EARLY TIME   COLONOSCOPY WITH PROPOFOL N/A 12/13/2015   Procedure: COLONOSCOPY WITH PROPOFOL;  Surgeon: Robert T [place], MD;  Location: [facility] ENDOSCOPY;  Service: Endoscopy;  Laterality: N/A;   ear surgery  05/2012   [name].  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  Benign.    HEMORRHOID SURGERY  1978   [name]   LAPAROSCOPIC CHOLECYSTECTOMY  1982   rotator cuff surgery Right 05/14/2016   VASECTOMY  1978   Social History   Socioeconomic History   Marital status: Married    Spouse name: Barbara   Number of children: 2   Years of education: 2 years college   Highest education level: Not on file  Occupational History   Occupation: retired    Comment: postal service in 2005  Tobacco Use   Smoking status: Never   Smokeless tobacco: Never  Vaping Use   Vaping Use: Never used  Substance and Sexual Activity   Alcohol use: Yes    Alcohol/week: 5.0 standard drinks of alcohol    Types: 5 Cans of beer per week    Comment: weekends beer, 4- 6 total [employer]   Drug use: No   Sexual activity: Yes    Partners: Female  Other Topics Concern   Not on file  Social History Narrative   Always uses seat belts. Smoke alarm and carbon monoxide detector in the home. Guns in the home stored in locked cabinet.Caffeine use: Coffee 2 servings per day, moderate amount. Exercise: Moderate walking 2 - 4 miles daily, 2 - 3 days per week.    Marital status:  Married x 47 years, happily.      Children:  2 children; 1 grandchild (  20).      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      Tobacco: never      Alcohol:  Weekends; beer 4-6 per weekend.      Exercise:  Walking every other day 2.5 miles three times per day.      Seatbelt: 100%; no texting      Guns: secured guns.      Living Will: completed in 2014.  FULL CODE but no prolonged measures.        ADLs: independent with ADLs; no assistant devices   Social Determinants of Health   Financial Resource Strain: Not on file  Food Insecurity: Not on file  Transportation Needs: Not on file  Physical Activity: Not on file  Stress: Not on file  Social Connections: Not on file  Intimate Partner Violence: Not on file   Family Status  Relation Name Status   Mother  Deceased at age 90       heart disease    Father  Deceased at age 69       lung cancer   Sister 1 Alive   Sister 2 Alive   Sister 3 Alive   Neg Hx  (Not Specified)   Family History  Problem Relation Age of Onset   Heart disease Mother        first MI in 80s   Diabetes Mother    Hyperlipidemia Mother    Arthritis Mother        rheumatoid   Cancer Father        lung   Diabetes Father    Arthritis Sister    Hyperlipidemia Sister    Hyperlipidemia Sister    Hypertension Sister    Hyperlipidemia Sister    Kidney disease Neg Hx    Prostate cancer Neg Hx    Colon cancer Neg Hx    Allergies  Allergen Reactions   Codeine Nausea Only    Patient Care Team: [name], Angela M, MD as PCP - General (Family Medicine)   Medications: Outpatient Medications Prior to Visit  Medication Sig   aspirin EC 81 MG tablet Take 81 mg by mouth daily.   Cyanocobalamin (VITAMIN B 12) 500 MCG TABS Take 100 mg by mouth daily at 6 (six) AM.   fluticasone (FLONASE) 50 MCG/ACT nasal spray Place 2 sprays into both nostrils daily.   Garlic 10 MG CAPS Take 10 mg by mouth daily.    hydrocortisone 2.5 % cream SMARTSIG:1 Topical Daily   ibuprofen (ADVIL,MOTRIN) 200 MG tablet Take 400 mg by mouth every 8 (eight) hours as needed (for pain.).   lisinopril (ZESTRIL) 40 MG tablet Take 1 tablet by mouth once daily   meloxicam (MOBIC) 15 MG tablet Take 15 mg by mouth daily.   Multiple Vitamins-Minerals (MULTIVITAMIN PO) Take by mouth daily.   Omega-3 Fatty Acids (FISH OIL CONCENTRATE) 1000 MG CAPS Take 1,000 mg by mouth daily.    scopolamine (TRANSDERM-SCOP) 1 MG/3DAYS Place 1 patch (1.5 mg total) onto the skin every 3 (three) days.   sildenafil (VIAGRA) 100 MG tablet Take 1 tablet (100 mg total) by mouth daily as needed for erectile dysfunction. Take two hours prior to intercourse on an empty stomach   simvastatin (ZOCOR) 20 MG tablet TAKE 1 TABLET BY MOUTH AT BEDTIME   terbinafine (LAMISIL) 250 MG tablet Take 1 tablet (250 mg total) by mouth daily.    triamcinolone cream (KENALOG) 0.1 % Apply 1 application topically 2 (two)   times daily.   vitamin E 400 UNIT capsule Take 400 Units by mouth daily.   No facility-administered medications prior to visit.    Review of Systems  All other systems reviewed and are negative.   Last CBC Lab Results  Component Value Date   WBC 7.6 02/10/2019   HGB 15.9 02/10/2019   HCT 46.6 02/10/2019   MCV 95 02/10/2019   MCH 32.3 02/10/2019   RDW 12.3 02/10/2019   PLT 267 02/04/2018   Last metabolic panel Lab Results  Component Value Date   GLUCOSE 104 (H) 08/16/2021   NA 142 08/16/2021   K 5.0 08/16/2021   CL 105 08/16/2021   CO2 23 08/16/2021   BUN 12 08/16/2021   CREATININE 1.17 08/16/2021   EGFR 66 08/16/2021   CALCIUM 9.7 08/16/2021   PROT 6.9 08/16/2021   ALBUMIN 4.6 08/16/2021   LABGLOB 2.3 08/16/2021   AGRATIO 2.0 08/16/2021   BILITOT 1.2 08/16/2021   ALKPHOS 45 08/16/2021   AST 27 08/16/2021   ALT 30 08/16/2021   Last lipids Lab Results  Component Value Date   CHOL 172 08/16/2021   HDL 54 08/16/2021   LDLCALC 91 08/16/2021   TRIG 159 (H) 08/16/2021   CHOLHDL 3.2 08/16/2021   Last hemoglobin A1c Lab Results  Component Value Date   HGBA1C 5.3 08/16/2021   Last thyroid functions No results found for: "TSH", "T3TOTAL", "T4TOTAL", "THYROIDAB" Last vitamin D No results found for: "25OHVITD2", "25OHVITD3", "VD25OH" Last vitamin B12 and Folate No results found for: "VITAMINB12", "FOLATE"    Objective    BP 109/76 (BP Location: Left Arm, Patient Position: Sitting, Cuff Size: Large)   Pulse 66   Temp 98.7 F (37.1 C) (Oral)   Resp 16   Ht 6' (1.829 m)   Wt 221 lb 9.6 oz (100.5 kg)   BMI 30.05 kg/m  BP Readings from Last 3 Encounters:  02/25/22 109/76  11/04/21 135/72  09/19/21 (!) 148/89   Wt Readings from Last 3 Encounters:  02/25/22 221 lb 9.6 oz (100.5 kg)  11/04/21 215 lb (97.5 kg)  09/19/21 212 lb (96.2 kg)      Physical Exam Vitals reviewed.   Constitutional:      General: He is not in acute distress.    Appearance: Normal appearance. He is well-developed. He is not diaphoretic.  HENT:     Head: Normocephalic and atraumatic.     Right Ear: Tympanic membrane, ear canal and external ear normal.     Left Ear: Tympanic membrane, ear canal and external ear normal.     Nose: Nose normal.     Mouth/Throat:     Mouth: Mucous membranes are moist.     Pharynx: Oropharynx is clear. No oropharyngeal exudate.  Eyes:     General: No scleral icterus.    Conjunctiva/sclera: Conjunctivae normal.     Pupils: Pupils are equal, round, and reactive to light.  Neck:     Thyroid: No thyromegaly.  Cardiovascular:     Rate and Rhythm: Normal rate and regular rhythm.     Heart sounds: Normal heart sounds. No murmur heard. Pulmonary:     Effort: Pulmonary effort is normal. No respiratory distress.     Breath sounds: Normal breath sounds. No wheezing or rales.  Abdominal:     General: There is no distension.     Palpations: Abdomen is soft.     Tenderness: There is no abdominal tenderness.  Musculoskeletal:          General: No deformity.     Cervical back: Neck supple.     Right lower leg: No edema.     Left lower leg: No edema.  Lymphadenopathy:     Cervical: No cervical adenopathy.  Skin:    General: Skin is warm and dry.     Findings: No rash.  Neurological:     Mental Status: He is alert and oriented to person, place, and time. Mental status is at baseline.     Gait: Gait normal.  Psychiatric:        Mood and Affect: Mood normal.        Behavior: Behavior normal.        Thought Content: Thought content normal.       Last depression screening scores    02/25/2022    8:41 AM 08/16/2021    8:18 AM 02/21/2021   10:29 AM  PHQ 2/9 Scores  PHQ - 2 Score 0 0 0  PHQ- 9 Score 0 0 0   Last fall risk screening    02/25/2022    8:41 AM  [place] in the past year? 0  Number falls in past yr: 0  Injury with Fall? 0   Risk for fall due to : No Fall Risks  Follow up Falls evaluation completed   Last Audit-C alcohol use screening    02/25/2022    8:41 AM  Alcohol Use Disorder Test (AUDIT)  1. How often do you have a drink containing alcohol? 3  2. How many drinks containing alcohol do you have on a typical day when you are drinking? 1  3. How often do you have six or more drinks on one occasion? 1  AUDIT-C Score 5   A score of 3 or more in women, and 4 or more in men indicates increased risk for alcohol abuse, EXCEPT if all of the points are from question 1   No results found for any visits on 02/25/22.  Assessment & Plan    Routine Health Maintenance and Physical Exam  Exercise Activities and Dietary recommendations  Goals   None     Immunization History  Administered Date(s) Administered   Fluad Quad(high Dose 65+) 01/08/2022   H1N1 04/07/2008   Influenza-Unspecified 02/19/2006, 03/01/2007, 02/03/2008, 01/28/2011, 01/27/2012, 02/02/2014, 01/28/2015, 12/27/2016, 12/27/2017, 12/28/2018, 12/28/2019, 02/07/2020, 12/01/2020   MMR 09/02/1994   PFIZER(Purple Top)SARS-COV-2 Vaccination 05/20/2019, 06/10/2019, 01/22/2020, 08/21/2020   PNEUMOCOCCAL CONJUGATE-20 01/01/2022   Pneumococcal Conjugate-13 05/29/2013, 12/05/2013   Pneumococcal Polysaccharide-23 12/02/2011, 10/27/2014, 06/05/2015   Rabies, IM 11/28/2019, 12/01/2019, 12/05/2019, 12/12/2019   Respiratory Syncytial Virus Vaccine,Recomb Aduvanted(Arexvy) 01/01/2022   Td 02/10/2019   Tdap 05/03/2008   Unspecified SARS-COV-2 Vaccination 01/18/2022   Zoster Recombinat (Shingrix) 04/10/2018, 07/08/2018   Zoster, Live 03/12/2011    Health Maintenance  Topic Date Due   Medicare Annual Wellness (AWV)  12/11/2015   COVID-19 Vaccine (6 - Pfizer risk series) 03/15/2022   TETANUS/TDAP  02/09/2029   COLONOSCOPY (Pts 45-32yr Insurance coverage will need to be confirmed)  11/05/2031   Pneumonia Vaccine 76 Years old  Completed   INFLUENZA  VACCINE  Completed   Hepatitis C Screening  Completed   Zoster Vaccines- Shingrix  Completed   HPV VACCINES  Aged Out    Discussed health benefits of physical activity, and encouraged him to engage in regular exercise appropriate for his age and condition.  Problem List Items Addressed This Visit       Cardiovascular and Mediastinum   Essential (  primary) hypertension    Well controlled Continue current medications Recheck metabolic panel F/u in 6 months       Relevant Orders   Comprehensive metabolic panel     Genitourinary   Enlarged prostate with lower urinary tract symptoms (LUTS)    Chronic and stable Not on medications Followed by urology Recheck PSA        Other   Pure hypercholesterolemia    Previously well controlled Continue statin Repeat FLP and CMP      Relevant Orders   Comprehensive metabolic panel   Lipid panel   Prediabetes    Recommend low carb diet Recheck A1c       Relevant Orders   Hemoglobin A1c   Obesity    Discussed importance of healthy weight management Discussed diet and exercise       Other Visit Diagnoses     Encounter for annual physical exam    -  Primary   Relevant Orders   PSA Total (Reflex To Free)   Hemoglobin A1c   Comprehensive metabolic panel   Lipid panel   Screening for prostate cancer       Relevant Orders   PSA Total (Reflex To Free)        Return in about 6 months (around 08/26/2022) for chronic disease f/u.     I, Angela [name], MD, have reviewed all documentation for this visit. The documentation on 02/25/22 for the exam, diagnosis, procedures, and orders are all accurate and complete.   [name], Angela M, MD, MPH [facility]   

## 2022-02-25 ENCOUNTER — Ambulatory Visit (INDEPENDENT_AMBULATORY_CARE_PROVIDER_SITE_OTHER): Payer: Federal, State, Local not specified - PPO | Admitting: Family Medicine

## 2022-02-25 ENCOUNTER — Encounter: Payer: Self-pay | Admitting: Family Medicine

## 2022-02-25 VITALS — BP 109/76 | HR 66 | Temp 98.7°F | Resp 16 | Ht 72.0 in | Wt 221.6 lb

## 2022-02-25 DIAGNOSIS — N401 Enlarged prostate with lower urinary tract symptoms: Secondary | ICD-10-CM

## 2022-02-25 DIAGNOSIS — Z0000 Encounter for general adult medical examination without abnormal findings: Secondary | ICD-10-CM

## 2022-02-25 DIAGNOSIS — Z125 Encounter for screening for malignant neoplasm of prostate: Secondary | ICD-10-CM

## 2022-02-25 DIAGNOSIS — E7800 Pure hypercholesterolemia, unspecified: Secondary | ICD-10-CM | POA: Diagnosis not present

## 2022-02-25 DIAGNOSIS — R7303 Prediabetes: Secondary | ICD-10-CM

## 2022-02-25 DIAGNOSIS — E875 Hyperkalemia: Secondary | ICD-10-CM

## 2022-02-25 DIAGNOSIS — Z6830 Body mass index (BMI) 30.0-30.9, adult: Secondary | ICD-10-CM

## 2022-02-25 DIAGNOSIS — I10 Essential (primary) hypertension: Secondary | ICD-10-CM | POA: Diagnosis not present

## 2022-02-25 DIAGNOSIS — E669 Obesity, unspecified: Secondary | ICD-10-CM

## 2022-02-25 NOTE — Assessment & Plan Note (Signed)
Recommend low carb diet °Recheck A1c  °

## 2022-02-25 NOTE — Assessment & Plan Note (Signed)
Well controlled Continue current medications Recheck metabolic panel F/u in 6 months  

## 2022-02-25 NOTE — Assessment & Plan Note (Signed)
Discussed importance of healthy weight management Discussed diet and exercise  

## 2022-02-25 NOTE — Assessment & Plan Note (Signed)
Chronic and stable Not on medications Followed by urology Recheck PSA

## 2022-02-25 NOTE — Assessment & Plan Note (Signed)
Previously well controlled Continue statin Repeat FLP and CMP  

## 2022-02-26 LAB — COMPREHENSIVE METABOLIC PANEL
ALT: 32 IU/L (ref 0–44)
AST: 28 IU/L (ref 0–40)
Albumin/Globulin Ratio: 1.8 (ref 1.2–2.2)
Albumin: 4.6 g/dL (ref 3.8–4.8)
Alkaline Phosphatase: 45 IU/L (ref 44–121)
BUN/Creatinine Ratio: 8 — ABNORMAL LOW (ref 10–24)
BUN: 10 mg/dL (ref 8–27)
Bilirubin Total: 1.4 mg/dL — ABNORMAL HIGH (ref 0.0–1.2)
CO2: 24 mmol/L (ref 20–29)
Calcium: 9.5 mg/dL (ref 8.6–10.2)
Chloride: 104 mmol/L (ref 96–106)
Creatinine, Ser: 1.31 mg/dL — ABNORMAL HIGH (ref 0.76–1.27)
Globulin, Total: 2.5 g/dL (ref 1.5–4.5)
Glucose: 109 mg/dL — ABNORMAL HIGH (ref 70–99)
Potassium: 5.4 mmol/L — ABNORMAL HIGH (ref 3.5–5.2)
Sodium: 144 mmol/L (ref 134–144)
Total Protein: 7.1 g/dL (ref 6.0–8.5)
eGFR: 57 mL/min/{1.73_m2} — ABNORMAL LOW (ref >59)

## 2022-02-26 LAB — PSA TOTAL (REFLEX TO FREE): Prostate Specific Ag, Serum: 0.4 ng/mL (ref 0.0–4.0)

## 2022-02-26 LAB — HEMOGLOBIN A1C
Est. average glucose Bld gHb Est-mCnc: 105 mg/dL
Hgb A1c MFr Bld: 5.3 % (ref 4.8–5.6)

## 2022-02-26 LAB — LIPID PANEL
Chol/HDL Ratio: 3.6 ratio (ref 0.0–5.0)
Cholesterol, Total: 177 mg/dL (ref 100–199)
HDL: 49 mg/dL (ref >39)
LDL Chol Calc (NIH): 103 mg/dL — ABNORMAL HIGH (ref 0–99)
Triglycerides: 144 mg/dL (ref 0–149)
VLDL Cholesterol Cal: 25 mg/dL (ref 5–40)

## 2022-02-28 NOTE — Addendum Note (Signed)
Addended by: Smitty Knudsen on: 02/28/2022 09:58 AM   Modules accepted: Orders

## 2022-03-11 LAB — BASIC METABOLIC PANEL
BUN/Creatinine Ratio: 9 — ABNORMAL LOW (ref 10–24)
BUN: 10 mg/dL (ref 8–27)
CO2: 24 mmol/L (ref 20–29)
Calcium: 9.7 mg/dL (ref 8.6–10.2)
Chloride: 103 mmol/L (ref 96–106)
Creatinine, Ser: 1.15 mg/dL (ref 0.76–1.27)
Glucose: 108 mg/dL — ABNORMAL HIGH (ref 70–99)
Potassium: 4.6 mmol/L (ref 3.5–5.2)
Sodium: 141 mmol/L (ref 134–144)
eGFR: 67 mL/min/{1.73_m2} (ref >59)

## 2022-03-27 ENCOUNTER — Other Ambulatory Visit: Payer: Self-pay | Admitting: Family Medicine

## 2022-04-24 ENCOUNTER — Ambulatory Visit
Admission: EM | Admit: 2022-04-24 | Discharge: 2022-04-24 | Disposition: A | Discharge status: Home / Self Care | Payer: No Typology Code available for payment source | Attending: Urgent Care | Admitting: Urgent Care

## 2022-04-24 DIAGNOSIS — J069 Acute upper respiratory infection, unspecified: Secondary | ICD-10-CM | POA: Diagnosis not present

## 2022-04-24 MED ORDER — BENZONATATE 100 MG PO CAPS: ORAL_CAPSULE | ORAL | 0 refills | Status: DC

## 2022-04-24 NOTE — ED Triage Notes (Signed)
Pt. Presents to UC w/ c/o a non-productive cough for the past 3 days.

## 2022-04-24 NOTE — Discharge Instructions (Signed)
You have been diagnosed with a viral upper respiratory infection based on your symptoms and exam. Viral illnesses cannot be treated with antibiotics - they are self limiting - and you should find your symptoms resolving within a few days. Get plenty of rest and non-caffeinated fluids. Watch for signs of dehydration including reduced urine output and dark colored urine.  We recommend you use over-the-counter medications for symptom control including acetaminophen (Tylenol), ibuprofen (Advil/Motrin) or naproxen (Aleve) for fever, chills or body aches. You may combine use of acetaminophen and ibuprofen/naproxen if needed. Also recommend  cold/cough medication, but please note that some cough medications are not recommended if you suffer from hypertension.  I have prescribed benzonatate for your cough.  Saline mist spray is helpful for removing excess mucus from your nose.  Room humidifiers are helpful to ease breathing at night. I recommend guaifenesin (Mucinex) to help thin and loosen mucus secretions in your respiratory passages.   Follow up here or with your primary care provider if your symptoms are worsening or not improving.

## 2022-04-24 NOTE — ED Provider Notes (Signed)
Ruben Garcia    CSN: [id number] Arrival date & time: 04/24/22  0805      History   Chief Complaint Chief Complaint  Patient presents with   Cough    HPI Ruben Garcia is Garcia 76 y.o. male.    Cough   Presents to urgent care with complaint of nonproductive cough x 3 days.  No relevant past medical history.  Patient expresses concern for bronchitis versus pneumonia and preventing worsening illness.  Past Medical History:  Diagnosis Date   Allergic rhinitis, cause unspecified    Basal cell carcinoma of skin, site unspecified 04/28/2010   Nasal; Ruben Garcia.   BPH (benign prostatic hypertrophy)    Colon polyps    Essential hypertension, benign    Hemorrhoids, internal    Incomplete bladder emptying    Other abnormal glucose    Over weight    Personal history of colonic polyps    Personal history of other genital system and obstetric disorders(V13.29)    Pure hypercholesterolemia    Unspecified disorder of kidney and ureter    Unspecified disorder of liver     Patient Active Problem List   Diagnosis Date Noted   Prediabetes 02/03/2017   Obesity 02/03/2017   Full thickness rotator cuff tear 04/08/2016   Impingement syndrome of shoulder region 04/08/2016   Enlarged prostate with lower urinary tract symptoms (LUTS) 06/26/2015   Personal history of other malignant neoplasm of skin 12/11/2014   Essential (primary) hypertension 11/29/2012   Pure hypercholesterolemia 11/29/2012    Past Surgical History:  Procedure Laterality Date   BASAL CELL CARCINOMA EXCISION  2012   Facial        [place]   COLONOSCOPY  11/13/2010   single polyp; repeatin 5 years; Ruben Garcia   COLONOSCOPY N/Garcia 11/04/2021   Procedure: COLONOSCOPY;  Surgeon: Lesly Rubenstein, MD;  Location: [facility] ENDOSCOPY;  Service: Endoscopy;  Laterality: N/Garcia;  REQUEST EARLY TIME   COLONOSCOPY WITH PROPOFOL N/Garcia 12/13/2015   Procedure: COLONOSCOPY WITH PROPOFOL;  Surgeon: [name], MD;  Location:  [facility] ENDOSCOPY;  Service: Endoscopy;  Laterality: N/Garcia;   ear surgery  05/2012   [name].  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  Benign.   [place]   rotator cuff surgery Right 05/14/2016   VASECTOMY  1978       Home Medications    Prior to Admission medications   Medication Sig Start Date End Date Taking? Authorizing Provider  aspirin EC 81 MG tablet Take 81 mg by mouth daily.    [provider]  Cyanocobalamin (VITAMIN B 12) 500 MCG TABS Take 100 mg by mouth daily at 6 (six) AM.    [provider]  fluticasone (FLONASE) 50 MCG/ACT nasal spray Place 2 sprays into both nostrils daily. 02/21/21   Ruben Crews, MD  Garlic 10 MG CAPS Take 10 mg by mouth daily.     [provider]  hydrocortisone 2.5 % cream SMARTSIG:1 Topical Daily 09/13/21   [provider]  ibuprofen (ADVIL,MOTRIN) 200 MG tablet Take 400 mg by mouth every 8 (eight) hours as needed (for pain.).    [provider]  lisinopril (ZESTRIL) 40 MG tablet Take 1 tablet by mouth once daily 03/27/22   [name], Makiera, MD  meloxicam (MOBIC) 15 MG tablet Take 15 mg by mouth daily.    [provider]  Multiple Vitamins-Minerals (MULTIVITAMIN PO) Take by mouth daily.    [provider]  Omega-3 Fatty Acids (FISH OIL CONCENTRATE) 1000 MG CAPS Take 1,000 mg by mouth daily.     [provider]  scopolamine (TRANSDERM-SCOP) 1 MG/3DAYS Place 1 patch (1.5 mg total) onto the skin every 3 (three) days. 01/03/22   [name], Ruben Sheer, MD  sildenafil (VIAGRA) 100 MG tablet Take 1 tablet (100 mg total) by mouth daily as needed for erectile dysfunction. Take two hours prior to intercourse on an empty stomach 09/13/19   McGowan, Ruben [place] A, PA-C  simvastatin (ZOCOR) 20 MG tablet TAKE 1 TABLET BY MOUTH AT BEDTIME 12/31/21   [name], Makiera, MD  terbinafine (LAMISIL) 250 MG tablet Take 1 tablet (250 mg total)  by mouth daily. 09/28/20   Ruben Crews, MD  triamcinolone cream (KENALOG) 0.1 % Apply 1 application topically 2 (two) times daily. 02/24/20   Faustino Congress, NP  vitamin E 400 UNIT capsule Take 400 Units by mouth daily.    [provider]    Family History Family History  Problem Relation Age of Onset   Heart disease Mother        first MI in 52s   Diabetes Mother    Hyperlipidemia Mother    Arthritis Mother        rheumatoid   Cancer Father        lung   Diabetes Father    Arthritis Sister    Hyperlipidemia Sister    Hyperlipidemia Sister    Hypertension Sister    Hyperlipidemia Sister    Kidney disease Neg Hx    Prostate cancer Neg Hx    Colon cancer Neg Hx     Social History Social History   Tobacco Use   Smoking status: Never   Smokeless tobacco: Never  Vaping Use   Vaping Use: Never used  Substance Use Topics   Alcohol use: Yes    Alcohol/week: 5.0 standard drinks of alcohol    Types: 5 Cans of beer per week    Comment: weekends beer, 4- 6 total Ruben Garcia   Drug use: No     Allergies   Codeine   Review of Systems Review of Systems  Respiratory:  Positive for cough.      Physical Exam Triage Vital Signs ED Triage Vitals [04/24/22 0818]  Enc Vitals Group     BP (!) 147/93     Pulse Rate 84     Resp 16     Temp 98.3 F (36.8 C)     Temp src      SpO2 95 %     Weight      Height      Head Circumference      Peak Flow      Pain Score 0     Pain Loc      Pain Edu?      Excl. in [place]?    No data found.  Updated Vital Signs BP (!) 147/93   Pulse 84   Temp 98.3 F (36.8 C)   Resp 16   SpO2 95%   Visual Acuity Right Eye Distance:   Left Eye Distance:   Bilateral Distance:    Right Eye Near:   Left Eye Near:    Bilateral Near:     Physical Exam Vitals reviewed.  Constitutional:      Appearance: Normal appearance.  Cardiovascular:     Rate and Rhythm: Normal rate and regular rhythm.     Pulses: Normal  pulses.  Heart sounds: Normal heart sounds.  Pulmonary:     Effort: Pulmonary effort is normal.     Breath sounds: Normal breath sounds.  Skin:    General: Skin is warm and dry.  Neurological:     General: No focal deficit present.     Mental Status: He is alert and oriented to person, place, and time.  Psychiatric:        Mood and Affect: Mood normal.        Behavior: Behavior normal.      UC Treatments / Results  Labs (all labs ordered are listed, but only abnormal results are displayed) Labs Reviewed - No data to display  EKG   Radiology No results found.  Procedures Procedures (including critical care time)  Medications Ordered in UC Medications - No data to display  Initial Impression / Assessment and Plan / UC Course  I have reviewed the triage vital signs and the nursing notes.  Pertinent labs & imaging results that were available during my care of the patient were reviewed by me and considered in my medical decision making (see chart for details).   Patient is afebrile here without recent antipyretics. Satting well on room air. Overall is fairly well appearing, well hydrated, without respiratory distress. Pulmonary exam is unremarkable.  Lungs CTAB without wheezing, rhonchi, rales.  Moderately severe dry sounding cough is present and triggered by deep breathing.  Patient's symptoms are suggestive of viral URI with cough.  Discussed antibiotic stewardship with the patient and the fact that antibiotics cannot be used to treat viral infections as they are ineffective.  Recommended he use OTC medication at home to treat his symptoms and will also provided benzonatate for cough.  We discussed follow-up if his symptoms do not resolve after another week (10 days of symptoms) to evaluate if he needs antibiotic for development of Garcia secondary infection.  Final Clinical Impressions(s) / UC Diagnoses   Final diagnoses:  None   Discharge Instructions   None    ED  Prescriptions   None    PDMP not reviewed this encounter.   Rose Phi, [place] 04/24/22 [phone]

## 2022-05-19 ENCOUNTER — Other Ambulatory Visit: Payer: Self-pay | Admitting: Family Medicine

## 2022-05-19 DIAGNOSIS — E7800 Pure hypercholesterolemia, unspecified: Secondary | ICD-10-CM

## 2022-06-23 ENCOUNTER — Other Ambulatory Visit: Payer: Self-pay | Admitting: Family Medicine

## 2022-08-27 NOTE — Progress Notes (Deleted)
I,Moriah Shawley S Charika Mikelson,acting as a Neurosurgeon for Shirlee Latch, MD.,have documented all relevant documentation on the behalf of Shirlee Latch, MD,as directed by  Shirlee Latch, MD while in the presence of Shirlee Latch, MD.     Established patient visit   Patient: Ruben Garcia   DOB: [birth year]   77 y.o. Male  MRN: [id number] Visit Date: 08/28/2022  Today's healthcare provider: Shirlee Latch, MD   No chief complaint on file.  Subjective    HPI  Hypertension, follow-up  BP Readings from Last 3 Encounters:  04/24/22 (!) 147/93  02/25/22 109/76  11/04/21 135/72   Wt Readings from Last 3 Encounters:  02/25/22 221 lb 9.6 oz (100.5 kg)  11/04/21 215 lb (97.5 kg)  09/19/21 212 lb (96.2 kg)     He was last seen for hypertension 6 months ago.  BP at that visit was 109/76. Management since that visit includes no changes. He reports {excellent/good/fair/poor:19665} compliance with treatment. He {is/is not:9024} having side effects. {document side effects if present:1}  Outside blood pressures are {enter patient reported home BP, or 'not being checked':1}. --------------------------------------------------------------------------------------------------- Lipid/Cholesterol, follow-up  Last Lipid Panel: Lab Results  Component Value Date   CHOL 177 02/25/2022   LDLCALC 103 (H) 02/25/2022   HDL 49 02/25/2022   TRIG 144 02/25/2022    He was last seen for this 6 months ago.  Management since that visit includes no changes.  He reports {excellent/good/fair/poor:19665} compliance with treatment. He {is/is not:9024} having side effects. {document side effects if present:1}  Last metabolic panel Lab Results  Component Value Date   GLUCOSE 108 (H) 03/10/2022   NA 141 03/10/2022   K 4.6 03/10/2022   BUN 10 03/10/2022   CREATININE 1.15 03/10/2022   EGFR 67 03/10/2022   GFRNONAA 67 02/16/2020   CALCIUM 9.7 03/10/2022   AST 28 02/25/2022   ALT 32 02/25/2022    The 10-year ASCVD risk score (Arnett DK, et al., 2019) is: 31.9%  ---------------------------------------------------------------------------------------------------   Medications: Outpatient Medications Prior to Visit  Medication Sig   aspirin EC 81 MG tablet Take 81 mg by mouth daily.   benzonatate (TESSALON) 100 MG capsule Take 1-2 tablets 3 times a day as needed for cough   Cyanocobalamin (VITAMIN B 12) 500 MCG TABS Take 100 mg by mouth daily at 6 (six) AM.   fluticasone (FLONASE) 50 MCG/ACT nasal spray Place 2 sprays into both nostrils daily.   Garlic 10 MG CAPS Take 10 mg by mouth daily.    hydrocortisone 2.5 % cream SMARTSIG:1 Topical Daily   ibuprofen (ADVIL,MOTRIN) 200 MG tablet Take 400 mg by mouth every 8 (eight) hours as needed (for pain.).   lisinopril (ZESTRIL) 40 MG tablet Take 1 tablet by mouth once daily   meloxicam (MOBIC) 15 MG tablet Take 15 mg by mouth daily.   Multiple Vitamins-Minerals (MULTIVITAMIN PO) Take by mouth daily.   Omega-3 Fatty Acids (FISH OIL CONCENTRATE) 1000 MG CAPS Take 1,000 mg by mouth daily.    scopolamine (TRANSDERM-SCOP) 1 MG/3DAYS Place 1 patch (1.5 mg total) onto the skin every 3 (three) days.   sildenafil (VIAGRA) 100 MG tablet Take 1 tablet (100 mg total) by mouth daily as needed for erectile dysfunction. Take two hours prior to intercourse on an empty stomach   simvastatin (ZOCOR) 20 MG tablet TAKE 1 TABLET BY MOUTH AT BEDTIME   terbinafine (LAMISIL) 250 MG tablet Take 1 tablet (250 mg total) by mouth daily.   triamcinolone cream (KENALOG) 0.1 %  Apply 1 application topically 2 (two) times daily.   vitamin E 400 UNIT capsule Take 400 Units by mouth daily.   No facility-administered medications prior to visit.    Review of Systems  Constitutional: Negative.  Negative for appetite change, chills and fatigue.  Eyes:  Negative for visual disturbance.  Respiratory:  Negative for chest tightness and shortness of breath.   Cardiovascular:   Negative for chest pain and leg swelling.  Gastrointestinal:  Negative for abdominal pain, nausea and vomiting.  Neurological:  Negative for dizziness, light-headedness and headaches.    {Labs  Heme  Chem  Endocrine  Serology  Results Review (optional):23779}   Objective    There were no vitals taken for this visit. BP Readings from Last 3 Encounters:  04/24/22 (!) 147/93  02/25/22 109/76  11/04/21 135/72   Wt Readings from Last 3 Encounters:  02/25/22 221 lb 9.6 oz (100.5 kg)  11/04/21 215 lb (97.5 kg)  09/19/21 212 lb (96.2 kg)      Physical Exam  ***  No results found for any visits on 08/28/22.  Assessment & Plan     ***  No follow-ups on file.      {provider attestation***:1}   Shirlee Latch, MD  [facility] [phone] (phone) [phone] (fax)  [facility]

## 2022-08-28 ENCOUNTER — Ambulatory Visit: Payer: Federal, State, Local not specified - PPO | Admitting: Family Medicine

## 2022-08-28 DIAGNOSIS — I10 Essential (primary) hypertension: Secondary | ICD-10-CM

## 2022-08-28 DIAGNOSIS — E7800 Pure hypercholesterolemia, unspecified: Secondary | ICD-10-CM

## 2022-09-04 ENCOUNTER — Ambulatory Visit
Admission: EM | Admit: 2022-09-04 | Discharge: 2022-09-04 | Disposition: A | Discharge status: Home / Self Care | Payer: No Typology Code available for payment source | Attending: Emergency Medicine | Admitting: Emergency Medicine

## 2022-09-04 DIAGNOSIS — J302 Other seasonal allergic rhinitis: Secondary | ICD-10-CM | POA: Diagnosis not present

## 2022-09-04 DIAGNOSIS — J029 Acute pharyngitis, unspecified: Secondary | ICD-10-CM

## 2022-09-04 LAB — POCT RAPID STREP A (OFFICE): Rapid Strep A Screen: NEGATIVE

## 2022-09-04 NOTE — ED Triage Notes (Signed)
Patient to Urgent Care with complaints of sore throat that started four days ago. Reports now having a dry cough/ sneezing/ nasal drainage/ fatigue. Denies any known fevers.  Has not attempted anything over the counter.

## 2022-09-04 NOTE — Discharge Instructions (Addendum)
Take Zyrtec and use Flonase nasal spray as directed.  Follow up with your primary care provider if your symptoms are not improving.   ? ?

## 2022-09-04 NOTE — ED Provider Notes (Signed)
Ruben Garcia    CSN: [id number] Arrival date & time: 09/04/22  4098      History   Chief Complaint Chief Complaint  Patient presents with   Sore Throat    HPI Ruben Garcia is a 77 y.o. male.  Patient presents with 4-day history of sore throat, postnasal drip, sneezing, cough.  No OTC medications taken today; previously treating with allergy medication.  He denies fever, shortness of breath, or other symptoms.  His medical history includes hypertension.  The history is provided by the patient and medical records.    Past Medical History:  Diagnosis Date   Allergic rhinitis, cause unspecified    Basal cell carcinoma of skin, site unspecified 04/28/2010   Nasal; Orson Aloe.   BPH (benign prostatic hypertrophy)    Colon polyps    Essential hypertension, benign    Hemorrhoids, internal    Incomplete bladder emptying    Other abnormal glucose    Over weight    Personal history of colonic polyps    Personal history of other genital system and obstetric disorders(V13.29)    Pure hypercholesterolemia    Unspecified disorder of kidney and ureter    Unspecified disorder of liver     Patient Active Problem List   Diagnosis Date Noted   Prediabetes 02/03/2017   Obesity 02/03/2017   Full thickness rotator cuff tear 04/08/2016   Impingement syndrome of shoulder region 04/08/2016   Enlarged prostate with lower urinary tract symptoms (LUTS) 06/26/2015   Personal history of other malignant neoplasm of skin 12/11/2014   Essential (primary) hypertension 11/29/2012   Pure hypercholesterolemia 11/29/2012    Past Surgical History:  Procedure Laterality Date   BASAL CELL CARCINOMA EXCISION  2012   Facial        [place]   COLONOSCOPY  11/13/2010   single polyp; repeatin 5 years; Mechele Collin   COLONOSCOPY N/A 11/04/2021   Procedure: COLONOSCOPY;  Surgeon: Regis Bill, MD;  Location: [facility] ENDOSCOPY;  Service: Endoscopy;  Laterality: N/A;  REQUEST EARLY TIME    COLONOSCOPY WITH PROPOFOL N/A 12/13/2015   Procedure: COLONOSCOPY WITH PROPOFOL;  Surgeon: Scot Jun, MD;  Location: [facility] ENDOSCOPY;  Service: Endoscopy;  Laterality: N/A;   ear surgery  05/2012   [name].  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  Benign.   HEMORRHOID SURGERY  1978   [name]   LAPAROSCOPIC CHOLECYSTECTOMY  1982   rotator cuff surgery Right 05/14/2016   VASECTOMY  1978       Home Medications    Prior to Admission medications   Medication Sig Start Date End Date Taking? Authorizing Provider  aspirin EC 81 MG tablet Take 81 mg by mouth daily.    [provider]  benzonatate (TESSALON) 100 MG capsule Take 1-2 tablets 3 times a day as needed for cough Patient not taking: Reported on 09/04/2022 04/24/22   Immordino, Jeannett Senior, FNP  Cyanocobalamin (VITAMIN B 12) 500 MCG TABS Take 100 mg by mouth daily at 6 (six) AM.    [provider]  fluticasone (FLONASE) 50 MCG/ACT nasal spray Place 2 sprays into both nostrils daily. 02/21/21   Erasmo Downer, MD  Garlic 10 MG CAPS Take 10 mg by mouth daily.     [provider]  hydrocortisone 2.5 % cream SMARTSIG:1 Topical Daily 09/13/21   [provider]  ibuprofen (ADVIL,MOTRIN) 200 MG tablet Take 400 mg by mouth every 8 (eight) hours as needed (for pain.).    [provider]  lisinopril (ZESTRIL)  40 MG tablet Take 1 tablet by mouth once daily 06/23/22   Erasmo Downer, MD  meloxicam (MOBIC) 15 MG tablet Take 15 mg by mouth daily.    [provider]  Multiple Vitamins-Minerals (MULTIVITAMIN PO) Take by mouth daily.    [provider]  Omega-3 Fatty Acids (FISH OIL CONCENTRATE) 1000 MG CAPS Take 1,000 mg by mouth daily.     [provider]  scopolamine (TRANSDERM-SCOP) 1 MG/3DAYS Place 1 patch (1.5 mg total) onto the skin every 3 (three) days. Patient not taking: Reported on 09/04/2022 01/03/22   [name], [name], MD  sildenafil (VIAGRA) 100 MG tablet Take 1 tablet (100  mg total) by mouth daily as needed for erectile dysfunction. Take two hours prior to intercourse on an empty stomach 09/13/19   [name], [name], PA-C  simvastatin (ZOCOR) 20 MG tablet TAKE 1 TABLET BY MOUTH AT BEDTIME 05/19/22   [name], [name], MD  terbinafine (LAMISIL) 250 MG tablet Take 1 tablet (250 mg total) by mouth daily. 09/28/20   Erasmo Downer, MD  triamcinolone cream (KENALOG) 0.1 % Apply 1 application topically 2 (two) times daily. 02/24/20   Moshe Cipro, NP  vitamin E 400 UNIT capsule Take 400 Units by mouth daily.    [provider]    Family History Family History  Problem Relation Age of Onset   Heart disease Mother        first MI in 50s   Diabetes Mother    Hyperlipidemia Mother    Arthritis Mother        rheumatoid   Cancer Father        lung   Diabetes Father    Arthritis Sister    Hyperlipidemia Sister    Hyperlipidemia Sister    Hypertension Sister    Hyperlipidemia Sister    Kidney disease Neg Hx    Prostate cancer Neg Hx    Colon cancer Neg Hx     Social History Social History   Tobacco Use   Smoking status: Never   Smokeless tobacco: Never  Vaping Use   Vaping Use: Never used  Substance Use Topics   Alcohol use: Yes    Alcohol/week: 5.0 standard drinks of alcohol    Types: 5 Cans of beer per week    Comment: weekends beer, 4- 6 total Sherilyn Dacosta   Drug use: No     Allergies   Codeine   Review of Systems Review of Systems  Constitutional:  Negative for chills and fever.  HENT:  Positive for postnasal drip, sneezing and sore throat. Negative for ear pain.   Respiratory:  Positive for cough. Negative for shortness of breath.   Cardiovascular:  Negative for chest pain and palpitations.     Physical Exam Triage Vital Signs ED Triage Vitals  Enc Vitals Group     BP      Pulse      Resp      Temp      Temp src      SpO2      Weight      Height      Head Circumference      Peak Flow      Pain Score       Pain Loc      Pain Edu?      Excl. in GC?    No data found.  Updated Vital Signs BP 111/78   Pulse 75   Temp 98.1 F (36.7  C)   Resp 18   SpO2 95%   Visual Acuity Right Eye Distance:   Left Eye Distance:   Bilateral Distance:    Right Eye Near:   Left Eye Near:    Bilateral Near:     Physical Exam Vitals and nursing note reviewed.  Constitutional:      General: He is not in acute distress.    Appearance: Normal appearance. He is well-developed. He is not ill-appearing.  HENT:     Right Ear: Tympanic membrane normal.     Left Ear: Tympanic membrane normal.     Nose: Nose normal.     Mouth/Throat:     Mouth: Mucous membranes are moist.     Pharynx: Oropharynx is clear.     Comments: Clear PND. Cardiovascular:     Rate and Rhythm: Normal rate and regular rhythm.     Heart sounds: Normal heart sounds.  Pulmonary:     Effort: Pulmonary effort is normal. No respiratory distress.     Breath sounds: Normal breath sounds.  Musculoskeletal:     Cervical back: Neck supple.  Skin:    General: Skin is warm and dry.  Neurological:     Mental Status: He is alert.  Psychiatric:        Mood and Affect: Mood normal.        Behavior: Behavior normal.      UC Treatments / Results  Labs (all labs ordered are listed, but only abnormal results are displayed) Labs Reviewed  POCT RAPID STREP A (OFFICE)    EKG   Radiology No results found.  Procedures Procedures (including critical care time)  Medications Ordered in UC Medications - No data to display  Initial Impression / Assessment and Plan / UC Course  I have reviewed the triage vital signs and the nursing notes.  Pertinent labs & imaging results that were available during my care of the patient were reviewed by me and considered in my medical decision making (see chart for details).    Seasonal allergies, sore throat.  Rapid strep negative.  Discussed symptomatic treatment including Flonase and Zyrtec.   Education provided on allergic rhinitis and sore throat.  Instructed patient to follow up with his PCP if his symptoms are not improving.  He agrees to plan of care.    Final Clinical Impressions(s) / UC Diagnoses   Final diagnoses:  Seasonal allergies  Sore throat     Discharge Instructions      Take Zyrtec and use Flonase nasal spray as directed.  Follow up with your primary care provider if your symptoms are not improving.        ED Prescriptions   None    PDMP not reviewed this encounter.   Mickie Bail, NP 09/04/22 [phone]

## 2022-09-10 NOTE — Progress Notes (Signed)
I,Sulibeya S Dimas,acting as a Neurosurgeon for Shirlee Latch, MD.,have documented all relevant documentation on the behalf of Shirlee Latch, MD,as directed by  Shirlee Latch, MD while in the presence of Shirlee Latch, MD.      Established patient visit   Patient: Ruben Garcia   DOB: 1946/03/23   77 y.o. Male  MRN: [id number] Visit Date: 09/12/2022  Today's healthcare provider: Shirlee Latch, MD   Chief Complaint  Patient presents with   Hypertension   Hyperlipidemia   Subjective    HPI  Hypertension, follow-up  BP Readings from Last 3 Encounters:  09/12/22 111/77  09/04/22 111/78  04/24/22 (!) 147/93   Wt Readings from Last 3 Encounters:  09/12/22 219 lb 12.8 oz (99.7 kg)  02/25/22 221 lb 9.6 oz (100.5 kg)  11/04/21 215 lb (97.5 kg)     He was last seen for hypertension 6 months ago.  BP at that visit was 109/76. Management since that visit includes no changes. He reports excellent compliance with treatment. He is not having side effects.    Outside blood pressures are stable.  Last metabolic panel Lab Results  Component Value Date   GLUCOSE 108 (H) 03/10/2022   NA 141 03/10/2022   K 4.6 03/10/2022   CL 103 03/10/2022   CO2 24 03/10/2022   BUN 10 03/10/2022   CREATININE 1.15 03/10/2022   EGFR 67 03/10/2022   CALCIUM 9.7 03/10/2022   PROT 7.1 02/25/2022   ALBUMIN 4.6 02/25/2022   LABGLOB 2.5 02/25/2022   AGRATIO 1.8 02/25/2022   BILITOT 1.4 (H) 02/25/2022   ALKPHOS 45 02/25/2022   AST 28 02/25/2022   ALT 32 02/25/2022   --------------------------------------------------------------------------------------------------- Lipid/Cholesterol, follow-up  Last Lipid Panel: Lab Results  Component Value Date   CHOL 177 02/25/2022   LDLCALC 103 (H) 02/25/2022   HDL 49 02/25/2022   TRIG 144 02/25/2022    He was last seen for this 6 months ago.  Management since that visit includes no changes.  He reports excellent compliance with  treatment. He is not having side effects.   The 10-year ASCVD risk score (Arnett DK, et al., 2019) is: 20.7%  ---------------------------------------------------------------------------------------------------   Medications: Outpatient Medications Prior to Visit  Medication Sig   aspirin EC 81 MG tablet Take 81 mg by mouth daily.   benzonatate (TESSALON) 100 MG capsule Take 1-2 tablets 3 times a day as needed for cough (Patient not taking: Reported on 09/04/2022)   Cyanocobalamin (VITAMIN B 12) 500 MCG TABS Take 100 mg by mouth daily at 6 (six) AM.   fluticasone (FLONASE) 50 MCG/ACT nasal spray Place 2 sprays into both nostrils daily.   Garlic 10 MG CAPS Take 10 mg by mouth daily.    hydrocortisone 2.5 % cream SMARTSIG:1 Topical Daily   ibuprofen (ADVIL,MOTRIN) 200 MG tablet Take 400 mg by mouth every 8 (eight) hours as needed (for pain.).   lisinopril (ZESTRIL) 40 MG tablet Take 1 tablet by mouth once daily   meloxicam (MOBIC) 15 MG tablet Take 15 mg by mouth daily.   Multiple Vitamins-Minerals (MULTIVITAMIN PO) Take by mouth daily.   Omega-3 Fatty Acids (FISH OIL CONCENTRATE) 1000 MG CAPS Take 1,000 mg by mouth daily.    scopolamine (TRANSDERM-SCOP) 1 MG/3DAYS Place 1 patch (1.5 mg total) onto the skin every 3 (three) days. (Patient not taking: Reported on 09/04/2022)   sildenafil (VIAGRA) 100 MG tablet Take 1 tablet (100 mg total) by mouth daily as needed for erectile dysfunction. Take two  hours prior to intercourse on an empty stomach   simvastatin (ZOCOR) 20 MG tablet TAKE 1 TABLET BY MOUTH AT BEDTIME   terbinafine (LAMISIL) 250 MG tablet Take 1 tablet (250 mg total) by mouth daily.   triamcinolone cream (KENALOG) 0.1 % Apply 1 application topically 2 (two) times daily.   vitamin E 400 UNIT capsule Take 400 Units by mouth daily.   No facility-administered medications prior to visit.    Review of Systems  Constitutional:  Negative for appetite change and fatigue.  Eyes:  Negative  for visual disturbance.  Respiratory:  Negative for chest tightness and shortness of breath.   Cardiovascular:  Negative for chest pain and leg swelling.  Gastrointestinal:  Negative for abdominal pain, nausea and vomiting.  Neurological:  Negative for dizziness, light-headedness and headaches.       Objective    BP 111/77 (BP Location: Left Arm, Patient Position: Sitting, Cuff Size: Large)   Pulse 79   Temp 97.8 F (36.6 C) (Temporal)   Resp 12   Wt 219 lb 12.8 oz (99.7 kg)   SpO2 97%   BMI 29.81 kg/m  BP Readings from Last 3 Encounters:  09/12/22 111/77  09/04/22 111/78  04/24/22 (!) 147/93   Wt Readings from Last 3 Encounters:  09/12/22 219 lb 12.8 oz (99.7 kg)  02/25/22 221 lb 9.6 oz (100.5 kg)  11/04/21 215 lb (97.5 kg)      Physical Exam Vitals reviewed.  Constitutional:      General: He is not in acute distress.    Appearance: Normal appearance. He is not diaphoretic.  HENT:     Head: Normocephalic and atraumatic.  Eyes:     General: No scleral icterus.    Conjunctiva/sclera: Conjunctivae normal.  Cardiovascular:     Rate and Rhythm: Normal rate and regular rhythm.     Pulses: Normal pulses.     Heart sounds: Normal heart sounds. No murmur heard. Pulmonary:     Effort: Pulmonary effort is normal. No respiratory distress.     Breath sounds: Normal breath sounds. No wheezing or rhonchi.  Musculoskeletal:     Cervical back: Neck supple.     Right lower leg: No edema.     Left lower leg: No edema.  Lymphadenopathy:     Cervical: No cervical adenopathy.  Skin:    General: Skin is warm and dry.     Findings: No rash.  Neurological:     Mental Status: He is alert and oriented to person, place, and time. Mental status is at baseline.  Psychiatric:        Mood and Affect: Mood normal.        Behavior: Behavior normal.      No results found for any visits on 09/12/22.  Assessment & Plan     Problem List Items Addressed This Visit        Cardiovascular and Mediastinum   Essential (primary) hypertension - Primary    Well controlled Continue current medications Recheck metabolic panel F/u in 6 months       Relevant Orders   Comprehensive metabolic panel     Genitourinary   Enlarged prostate with lower urinary tract symptoms (LUTS)    Chronic and stable Not on medications Followed by urology Recheck PSA      Relevant Orders   PSA Total (Reflex To Free)     Other   Pure hypercholesterolemia    Previously well controlled Continue statin Repeat FLP and CMP  Relevant Orders   Lipid Panel With LDL/HDL Ratio   Prediabetes    Recommend low carb diet Recheck A1c       Relevant Orders   Hemoglobin A1c     Return in about 6 months (around 03/15/2023) for CPE.      I, Shirlee Latch, MD, have reviewed all documentation for this visit. The documentation on 09/12/22 for the exam, diagnosis, procedures, and orders are all accurate and complete.   Rondal Vandevelde, [name], MD, MPH [facility]

## 2022-09-12 ENCOUNTER — Ambulatory Visit: Payer: Federal, State, Local not specified - PPO | Admitting: Family Medicine

## 2022-09-12 ENCOUNTER — Encounter: Payer: Self-pay | Admitting: Family Medicine

## 2022-09-12 VITALS — BP 111/77 | HR 79 | Temp 97.8°F | Resp 12 | Wt 219.8 lb

## 2022-09-12 DIAGNOSIS — N401 Enlarged prostate with lower urinary tract symptoms: Secondary | ICD-10-CM | POA: Diagnosis not present

## 2022-09-12 DIAGNOSIS — R7303 Prediabetes: Secondary | ICD-10-CM

## 2022-09-12 DIAGNOSIS — E7800 Pure hypercholesterolemia, unspecified: Secondary | ICD-10-CM

## 2022-09-12 DIAGNOSIS — I10 Essential (primary) hypertension: Secondary | ICD-10-CM

## 2022-09-12 NOTE — Assessment & Plan Note (Signed)
Previously well controlled Continue statin Repeat FLP and CMP  

## 2022-09-12 NOTE — Assessment & Plan Note (Signed)
Recommend low carb diet °Recheck A1c  °

## 2022-09-12 NOTE — Assessment & Plan Note (Signed)
Chronic and stable Not on medications Followed by urology Recheck PSA 

## 2022-09-12 NOTE — Assessment & Plan Note (Signed)
Well controlled Continue current medications Recheck metabolic panel F/u in 6 months  

## 2022-09-13 LAB — COMPREHENSIVE METABOLIC PANEL
ALT: 39 IU/L (ref 0–44)
AST: 30 IU/L (ref 0–40)
Albumin/Globulin Ratio: 1.9 (ref 1.2–2.2)
Albumin: 4.4 g/dL (ref 3.8–4.8)
Alkaline Phosphatase: 49 IU/L (ref 44–121)
BUN/Creatinine Ratio: 9 — ABNORMAL LOW (ref 10–24)
BUN: 11 mg/dL (ref 8–27)
Bilirubin Total: 1.1 mg/dL (ref 0.0–1.2)
CO2: 21 mmol/L (ref 20–29)
Calcium: 9.5 mg/dL (ref 8.6–10.2)
Chloride: 104 mmol/L (ref 96–106)
Creatinine, Ser: 1.19 mg/dL (ref 0.76–1.27)
Globulin, Total: 2.3 g/dL (ref 1.5–4.5)
Glucose: 105 mg/dL — ABNORMAL HIGH (ref 70–99)
Potassium: 5.2 mmol/L (ref 3.5–5.2)
Sodium: 142 mmol/L (ref 134–144)
Total Protein: 6.7 g/dL (ref 6.0–8.5)
eGFR: 64 mL/min/{1.73_m2} (ref >59)

## 2022-09-13 LAB — HEMOGLOBIN A1C
Est. average glucose Bld gHb Est-mCnc: 114 mg/dL
Hgb A1c MFr Bld: 5.6 % (ref 4.8–5.6)

## 2022-09-13 LAB — LIPID PANEL WITH LDL/HDL RATIO
Cholesterol, Total: 161 mg/dL (ref 100–199)
HDL: 50 mg/dL (ref >39)
LDL Chol Calc (NIH): 90 mg/dL (ref 0–99)
LDL/HDL Ratio: 1.8 ratio (ref 0.0–3.6)
Triglycerides: 115 mg/dL (ref 0–149)
VLDL Cholesterol Cal: 21 mg/dL (ref 5–40)

## 2022-09-13 LAB — PSA TOTAL (REFLEX TO FREE): Prostate Specific Ag, Serum: 0.3 ng/mL (ref 0.0–4.0)

## 2022-09-18 ENCOUNTER — Other Ambulatory Visit: Payer: Federal, State, Local not specified - PPO
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
## 2022-09-23 ENCOUNTER — Encounter: Payer: Self-pay | Admitting: Urology

## 2022-09-23 ENCOUNTER — Ambulatory Visit (INDEPENDENT_AMBULATORY_CARE_PROVIDER_SITE_OTHER): Payer: Federal, State, Local not specified - PPO | Admitting: Urology

## 2022-09-23 VITALS — BP 143/87 | HR 87 | Ht 72.0 in | Wt 219.0 lb

## 2022-09-23 DIAGNOSIS — N401 Enlarged prostate with lower urinary tract symptoms: Secondary | ICD-10-CM | POA: Diagnosis not present

## 2022-09-23 DIAGNOSIS — N529 Male erectile dysfunction, unspecified: Secondary | ICD-10-CM

## 2022-09-23 DIAGNOSIS — N138 Other obstructive and reflux uropathy: Secondary | ICD-10-CM

## 2022-09-23 DIAGNOSIS — N481 Balanitis: Secondary | ICD-10-CM | POA: Diagnosis not present

## 2022-09-23 MED ORDER — NYSTATIN-TRIAMCINOLONE 100000-0.1 UNIT/GM-% EX OINT: 1.0000 | TOPICAL_OINTMENT | Freq: Two times a day (BID) | CUTANEOUS | 0 refills | Status: DC
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
## 2022-10-09 ENCOUNTER — Encounter: Payer: Self-pay | Admitting: Urology

## 2022-10-09 ENCOUNTER — Ambulatory Visit: Payer: Federal, State, Local not specified - PPO | Admitting: Urology

## 2022-10-09 VITALS — BP 112/76 | HR 77 | Ht 72.0 in | Wt 220.0 lb

## 2022-10-09 DIAGNOSIS — N529 Male erectile dysfunction, unspecified: Secondary | ICD-10-CM

## 2022-10-09 DIAGNOSIS — N481 Balanitis: Secondary | ICD-10-CM | POA: Diagnosis not present

## 2022-10-09 DIAGNOSIS — N401 Enlarged prostate with lower urinary tract symptoms: Secondary | ICD-10-CM | POA: Diagnosis not present

## 2023-03-19 ENCOUNTER — Encounter: Payer: Federal, State, Local not specified - PPO | Admitting: Family Medicine

## 2023-03-31 ENCOUNTER — Ambulatory Visit (INDEPENDENT_AMBULATORY_CARE_PROVIDER_SITE_OTHER): Payer: Federal, State, Local not specified - PPO | Admitting: Family Medicine

## 2023-03-31 ENCOUNTER — Encounter: Payer: Self-pay | Admitting: Family Medicine

## 2023-03-31 VITALS — BP 138/80 | HR 71 | Resp 16 | Ht 72.0 in | Wt 221.3 lb

## 2023-03-31 DIAGNOSIS — Z6830 Body mass index (BMI) 30.0-30.9, adult: Secondary | ICD-10-CM

## 2023-03-31 DIAGNOSIS — E7800 Pure hypercholesterolemia, unspecified: Secondary | ICD-10-CM

## 2023-03-31 DIAGNOSIS — I10 Essential (primary) hypertension: Secondary | ICD-10-CM | POA: Diagnosis not present

## 2023-03-31 DIAGNOSIS — R7303 Prediabetes: Secondary | ICD-10-CM

## 2023-03-31 DIAGNOSIS — Z0000 Encounter for general adult medical examination without abnormal findings: Secondary | ICD-10-CM

## 2023-03-31 DIAGNOSIS — E66811 Obesity, class 1: Secondary | ICD-10-CM

## 2023-03-31 NOTE — Assessment & Plan Note (Signed)
Recommend low carb diet °Recheck A1c  °

## 2023-03-31 NOTE — Assessment & Plan Note (Signed)
Discussed importance of healthy weight management Discussed diet and exercise  

## 2023-03-31 NOTE — Assessment & Plan Note (Signed)
Hypertension is well-controlled with lisinopril 40 mg daily.  Discussed the importance of regular monitoring and maintaining current medication regimen to prevent complications such as stroke and heart disease. - Continue lisinopril 40 mg daily - Monitor blood pressure regularly

## 2023-03-31 NOTE — Progress Notes (Signed)
Complete physical exam  Patient: Ruben Garcia   DOB: [birth year]   77 y.o. Male  MRN: [id number]  Subjective:    Chief Complaint  Patient presents with   Annual Exam    SHERMAN PERAULT is a 77 y.o. male who presents today for a complete physical exam. He reports consuming a general diet.  He generally feels well. He reports sleeping well. He does not have additional problems to discuss today.    Discussed the use of AI scribe software for clinical note transcription with the patient, who gave verbal consent to proceed.  History of Present Illness   The patient, with a past medical history of hypertension and hyperlipidemia, presents for a routine check-up. They report feeling physically well and have been adhering to their prescribed medications, which include simvastatin 20mg  daily for hyperlipidemia and lisinopril 40mg  daily for hypertension. The patient does not report any new symptoms or concerns related to their chronic conditions. They have been managing their health well and keeping up with their vaccinations. The patient also mentions some stress related to the current political climate, which could potentially impact their overall wellbeing.       Most recent fall risk assessment:    03/31/2023    8:41 AM  Fall Risk   Falls in the past year? 0  Number falls in past yr: 0  Injury with Fall? 0  Risk for fall due to : No Fall Risks     Most recent depression screenings:    03/31/2023    8:41 AM 09/12/2022    8:08 AM  PHQ 2/9 Scores  PHQ - 2 Score 0 0  PHQ- 9 Score  0        Patient Care Team: Erasmo Downer, MD as PCP - General (Family Medicine)   Outpatient Medications Prior to Visit  Medication Sig   aspirin EC 81 MG tablet Take 81 mg by mouth daily.   benzonatate (TESSALON) 100 MG capsule Take 1-2 tablets 3 times a day as needed for cough   Cyanocobalamin (VITAMIN B 12) 500 MCG TABS Take 100 mg by mouth daily at 6 (six) AM.   fluticasone (FLONASE)  50 MCG/ACT nasal spray Place 2 sprays into both nostrils daily.   Garlic 10 MG CAPS Take 10 mg by mouth daily.    hydrocortisone 2.5 % cream SMARTSIG:1 Topical Daily   ibuprofen (ADVIL,MOTRIN) 200 MG tablet Take 400 mg by mouth every 8 (eight) hours as needed (for pain.).   lisinopril (ZESTRIL) 40 MG tablet Take 1 tablet by mouth once daily   meloxicam (MOBIC) 15 MG tablet Take 15 mg by mouth daily.   Multiple Vitamins-Minerals (MULTIVITAMIN PO) Take by mouth daily.   nystatin-triamcinolone ointment (MYCOLOG) Apply 1 Application topically 2 (two) times daily.   Omega-3 Fatty Acids (FISH OIL CONCENTRATE) 1000 MG CAPS Take 1,000 mg by mouth daily.    sildenafil (VIAGRA) 100 MG tablet Take 1 tablet (100 mg total) by mouth daily as needed for erectile dysfunction. Take two hours prior to intercourse on an empty stomach   simvastatin (ZOCOR) 20 MG tablet TAKE 1 TABLET BY MOUTH AT BEDTIME   terbinafine (LAMISIL) 250 MG tablet Take 1 tablet (250 mg total) by mouth daily.   triamcinolone cream (KENALOG) 0.1 % Apply 1 application topically 2 (two) times daily.   vitamin E 400 UNIT capsule Take 400 Units by mouth daily.   No facility-administered medications prior to visit.    ROS  Objective:     BP 138/80 (BP Location: Left Arm, Patient Position: Sitting, Cuff Size: Large)   Pulse 71   Resp 16   Ht 6' (1.829 m)   Wt 221 lb 4.8 oz (100.4 kg)   BMI 30.01 kg/m    Physical Exam Vitals reviewed.  Constitutional:      General: He is not in acute distress.    Appearance: Normal appearance. He is well-developed. He is not diaphoretic.  HENT:     Head: Normocephalic and atraumatic.     Right Ear: Tympanic membrane, ear canal and external ear normal.     Left Ear: Tympanic membrane, ear canal and external ear normal.     Nose: Nose normal.     Mouth/Throat:     Mouth: Mucous membranes are moist.     Pharynx: Oropharynx is clear. No oropharyngeal exudate.  Eyes:     General: No  scleral icterus.    Conjunctiva/sclera: Conjunctivae normal.     Pupils: Pupils are equal, round, and reactive to light.  Neck:     Thyroid: No thyromegaly.  Cardiovascular:     Rate and Rhythm: Normal rate and regular rhythm.     Heart sounds: Normal heart sounds. No murmur heard. Pulmonary:     Effort: Pulmonary effort is normal. No respiratory distress.     Breath sounds: Normal breath sounds. No wheezing or rales.  Abdominal:     General: There is no distension.     Palpations: Abdomen is soft.     Tenderness: There is no abdominal tenderness.  Musculoskeletal:        General: No deformity.     Cervical back: Neck supple.     Right lower leg: No edema.     Left lower leg: No edema.  Lymphadenopathy:     Cervical: No cervical adenopathy.  Skin:    General: Skin is warm and dry.     Findings: No rash.  Neurological:     Mental Status: He is alert and oriented to person, place, and time. Mental status is at baseline.     Gait: Gait normal.  Psychiatric:        Mood and Affect: Mood normal.        Behavior: Behavior normal.        Thought Content: Thought content normal.      No results found for any visits on 03/31/23.     Assessment & Plan:    Routine Health Maintenance and Physical Exam  Immunization History  Administered Date(s) Administered   Fluad Quad(high Dose 65+) 01/08/2022   Influenza-Unspecified 02/19/2006, 03/01/2007, 02/03/2008, 01/28/2011, 01/27/2012, 02/02/2014, 01/28/2015, 12/27/2016, 12/27/2017, 12/28/2018, 12/28/2019, 02/07/2020, 12/01/2020   MMR 09/02/1994   PFIZER(Purple Top)SARS-COV-2 Vaccination 05/20/2019, 06/10/2019, 01/22/2020, 08/21/2020   PNEUMOCOCCAL CONJUGATE-20 01/01/2022   Pneumococcal Conjugate-13 05/29/2013, 12/05/2013   Pneumococcal Polysaccharide-23 12/02/2011, 10/27/2014, 06/05/2015   Rabies, IM 11/28/2019, 12/01/2019, 12/05/2019, 12/12/2019   Respiratory Syncytial Virus Vaccine,Recomb Aduvanted(Arexvy) 01/01/2022   Td  02/10/2019   Tdap 05/03/2008   Unspecified SARS-COV-2 Vaccination 01/18/2022   Zoster Recombinant(Shingrix) 04/10/2018, 07/08/2018   Zoster, Live 03/12/2011    Health Maintenance  Topic Date Due   COVID-19 Vaccine (7 - 2023-24 season) 02/23/2023   DTaP/Tdap/Td (3 - Td or Tdap) 02/09/2029   Colonoscopy  11/05/2031   Pneumonia Vaccine 14+ Years old  Completed   INFLUENZA VACCINE  Completed   Hepatitis C Screening  Completed   Zoster Vaccines- Shingrix  Completed   HPV VACCINES  Aged  Out    Discussed health benefits of physical activity, and encouraged him to engage in regular exercise appropriate for his age and condition.  Problem List Items Addressed This Visit       Cardiovascular and Mediastinum   Essential (primary) hypertension    Hypertension is well-controlled with lisinopril 40 mg daily.  Discussed the importance of regular monitoring and maintaining current medication regimen to prevent complications such as stroke and heart disease. - Continue lisinopril 40 mg daily - Monitor blood pressure regularly      Relevant Orders   Comprehensive metabolic panel     Other   Pure hypercholesterolemia    Hyperlipidemia is managed with simvastatin 20 mg daily. Previous cholesterol levels were within acceptable range. Discussed the benefits of continued statin therapy in reducing the risk of cardiovascular events. - Continue simvastatin 20 mg daily - Order lipid panel      Relevant Orders   Lipid panel   Comprehensive metabolic panel   Prediabetes    Recommend low carb diet Recheck A1c       Relevant Orders   Hemoglobin A1c   Obesity    Discussed importance of healthy weight management Discussed diet and exercise       Other Visit Diagnoses     Encounter for annual physical exam    -  Primary   Relevant Orders   Lipid panel   Comprehensive metabolic panel   Hemoglobin A1c           General Health Maintenance Up-to-date on colonoscopy screenings and  vaccinations. Last colonoscopy showed polyps, requiring follow-up in 5 years. Routine screening colonoscopies not recommended after age 43 unless symptomatic due to risk outweighing benefits. No new symptoms reported. - Order comprehensive lab panel including cholesterol, kidney and liver function, and A1c - Schedule next PSA test in six months - Schedule six-month follow-up appointment  Follow-up - Schedule six-month follow-up appointment - Provide lab slip for comprehensive panel - Send lab results via patient portal.       Return in about 6 months (around 09/29/2023) for chronic disease f/u.     Shirlee Latch, MD

## 2023-03-31 NOTE — Assessment & Plan Note (Signed)
Hyperlipidemia is managed with simvastatin 20 mg daily. Previous cholesterol levels were within acceptable range. Discussed the benefits of continued statin therapy in reducing the risk of cardiovascular events. - Continue simvastatin 20 mg daily - Order lipid panel

## 2023-04-01 LAB — COMPREHENSIVE METABOLIC PANEL
ALT: 38 [IU]/L (ref 0–44)
AST: 34 [IU]/L (ref 0–40)
Albumin: 4.7 g/dL (ref 3.8–4.8)
Alkaline Phosphatase: 52 [IU]/L (ref 44–121)
BUN/Creatinine Ratio: 9 — ABNORMAL LOW (ref 10–24)
BUN: 11 mg/dL (ref 8–27)
Bilirubin Total: 1.9 mg/dL — ABNORMAL HIGH (ref 0.0–1.2)
CO2: 26 mmol/L (ref 20–29)
Calcium: 10 mg/dL (ref 8.6–10.2)
Chloride: 103 mmol/L (ref 96–106)
Creatinine, Ser: 1.21 mg/dL (ref 0.76–1.27)
Globulin, Total: 2.7 g/dL (ref 1.5–4.5)
Glucose: 103 mg/dL — ABNORMAL HIGH (ref 70–99)
Potassium: 4.9 mmol/L (ref 3.5–5.2)
Sodium: 143 mmol/L (ref 134–144)
Total Protein: 7.4 g/dL (ref 6.0–8.5)
eGFR: 62 mL/min/{1.73_m2} (ref >59)

## 2023-04-01 LAB — HEMOGLOBIN A1C
Est. average glucose Bld gHb Est-mCnc: 111 mg/dL
Hgb A1c MFr Bld: 5.5 % (ref 4.8–5.6)

## 2023-04-01 LAB — LIPID PANEL
Chol/HDL Ratio: 4.2 {ratio} (ref 0.0–5.0)
Cholesterol, Total: 189 mg/dL (ref 100–199)
HDL: 45 mg/dL (ref >39)
LDL Chol Calc (NIH): 111 mg/dL — ABNORMAL HIGH (ref 0–99)
Triglycerides: 192 mg/dL — ABNORMAL HIGH (ref 0–149)
VLDL Cholesterol Cal: 33 mg/dL (ref 5–40)

## 2023-04-08 ENCOUNTER — Telehealth: Payer: Self-pay

## 2023-04-08 LAB — SPECIMEN STATUS REPORT

## 2023-04-08 LAB — BILIRUBIN, FRACTIONATED(TOT/DIR/INDIR)
Bilirubin Total: 1.7 mg/dL — ABNORMAL HIGH (ref 0.0–1.2)
Bilirubin, Direct: 0.4 mg/dL (ref 0.00–0.40)
Bilirubin, Indirect: 1.3 mg/dL — ABNORMAL HIGH (ref 0.10–0.80)

## 2023-04-08 NOTE — Telephone Encounter (Signed)
Copied from CRM [id number]. Topic: General - Inquiry >> Apr 08, 2023  9:06 AM [name] wrote: Reason for CRM: pt wants Dr B to know he will re take the fractionated bilirubin on Monday morning.Ruben Garcia

## 2023-04-09 NOTE — Telephone Encounter (Signed)
LVMTCB. CRM created. Ok for [facility] to advise/clarify.

## 2023-04-09 NOTE — Telephone Encounter (Signed)
He doesn't need to re-take it. The lab was able to add it on. See result note.

## 2023-04-29 DIAGNOSIS — C8333 Diffuse large B-cell lymphoma, intra-abdominal lymph nodes: Secondary | ICD-10-CM

## 2023-04-29 HISTORY — DX: Diffuse large b-cell lymphoma, intra-abdominal lymph nodes: C83.33

## 2023-05-17 ENCOUNTER — Other Ambulatory Visit: Payer: Self-pay | Admitting: Family Medicine

## 2023-05-17 DIAGNOSIS — E7800 Pure hypercholesterolemia, unspecified: Secondary | ICD-10-CM

## 2023-05-18 NOTE — Telephone Encounter (Signed)
Requested Prescriptions  Pending Prescriptions Disp Refills   simvastatin (ZOCOR) 20 MG tablet [Pharmacy Med Name: Simvastatin 20 MG Oral Tablet] 90 tablet 1    Sig: TAKE 1 TABLET BY MOUTH AT BEDTIME     Cardiovascular:  Antilipid - Statins Failed - 05/18/2023  9:27 AM      Failed - Lipid Panel in normal range within the last 12 months    Cholesterol, Total  Date Value Ref Range Status  03/31/2023 189 100 - 199 mg/dL Final   LDL Cholesterol (Calc)  Date Value Ref Range Status  02/03/2017 90 mg/dL (calc) Final    Comment:    Reference range: <100 . Desirable range <100 mg/dL for primary prevention;   <70 mg/dL for patients with CHD or diabetic patients  with > or = 2 CHD risk factors. [name] Kitchen LDL-C is now calculated using the Martin-Hopkins  calculation, which is a validated novel method providing  better accuracy than the Friedewald equation in the  estimation of LDL-C.  Horald Pollen et al. Lenox Ahr. 5409;811(91): 2061-2068  (http://education.[facility].com/faq/FAQ164)    LDL Chol Calc (NIH)  Date Value Ref Range Status  03/31/2023 111 (H) 0 - 99 mg/dL Final   HDL  Date Value Ref Range Status  03/31/2023 45 >39 mg/dL Final   Triglycerides  Date Value Ref Range Status  03/31/2023 192 (H) 0 - 149 mg/dL Final         Passed - Patient is not pregnant      Passed - Valid encounter within last 12 months    Recent Outpatient Visits           1 month ago Encounter for annual physical exam   [place] [facility] [place], [name], MD   8 months ago Essential (primary) hypertension   [place] [facility] [place], [name], MD   1 year ago Encounter for annual physical exam   [place] [facility] [place], [name], MD   1 year ago Essential (primary) hypertension   [place] [facility] [place], [name], MD   2 years ago COVID-19   [facility] Alfredia Ferguson, PA-C        Future Appointments             In 4 months [name], [name], MD [facility]   In 4 months McGowan, Elana [facility]

## 2023-06-12 ENCOUNTER — Other Ambulatory Visit: Payer: Self-pay | Admitting: Family Medicine

## 2023-09-11 ENCOUNTER — Ambulatory Visit
Admission: EM | Admit: 2023-09-11 | Discharge: 2023-09-11 | Disposition: A | Discharge status: Home / Self Care | Attending: Emergency Medicine | Admitting: Emergency Medicine

## 2023-09-11 ENCOUNTER — Other Ambulatory Visit: Payer: Self-pay | Admitting: Family Medicine

## 2023-09-11 DIAGNOSIS — J069 Acute upper respiratory infection, unspecified: Secondary | ICD-10-CM

## 2023-09-11 MED ORDER — AZITHROMYCIN 250 MG PO TABS: 250.0000 mg | ORAL_TABLET | Freq: Every day | ORAL | 0 refills | Status: DC

## 2023-09-11 MED ORDER — ALBUTEROL SULFATE HFA 108 (90 BASE) MCG/ACT IN AERS: 1.0000 | INHALATION_SPRAY | Freq: Four times a day (QID) | RESPIRATORY_TRACT | 0 refills | Status: DC | PRN

## 2023-09-11 NOTE — ED Provider Notes (Signed)
 Ruben Garcia    CSN: [id number] Arrival date & time: 09/11/23  0806      History   Chief Complaint Chief Complaint  Patient presents with   Cough   Wheezing    HPI Ruben Garcia is a 78 y.o. male.  Patient presents with 1 month history of cough and wheezing at night.  His cough is nonproductive.  No OTC medications taken.  No fever or shortness of breath.  The history is provided by the patient and medical records.    Past Medical History:  Diagnosis Date   Allergic rhinitis, cause unspecified    Basal cell carcinoma of skin, site unspecified 04/28/2010   Nasal; Ruben Garcia.   BPH (benign prostatic hypertrophy)    Colon polyps    Essential hypertension, benign    Hemorrhoids, internal    Incomplete bladder emptying    Other abnormal glucose    Over weight    Personal history of colonic polyps    Personal history of other genital system and obstetric disorders(V13.29)    Pure hypercholesterolemia    Unspecified disorder of kidney and ureter    Unspecified disorder of liver     Patient Active Problem List   Diagnosis Date Noted   Prediabetes 02/03/2017   Obesity 02/03/2017   Full thickness rotator cuff tear 04/08/2016   Impingement syndrome of shoulder region 04/08/2016   Enlarged prostate with lower urinary tract symptoms (LUTS) 06/26/2015   Personal history of other malignant neoplasm of skin 12/11/2014   Essential (primary) hypertension 11/29/2012   Pure hypercholesterolemia 11/29/2012    Past Surgical History:  Procedure Laterality Date   BASAL CELL CARCINOMA EXCISION  2012   Facial        Ruben Garcia   COLONOSCOPY  11/13/2010   single polyp; repeatin 5 years; Ruben Garcia   COLONOSCOPY N/A 11/04/2021   Procedure: COLONOSCOPY;  Surgeon: Ruben Darling, MD;  Location: [facility] ENDOSCOPY;  Service: Endoscopy;  Laterality: N/A;  REQUEST EARLY TIME   COLONOSCOPY WITH PROPOFOL  N/A 12/13/2015   Procedure: COLONOSCOPY WITH PROPOFOL ;  Surgeon: Ruben Click, MD;  Location: [facility] ENDOSCOPY;  Service: Endoscopy;  Laterality: N/A;   ear surgery  05/2012   Ruben Garcia.  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  Benign.   HEMORRHOID SURGERY  1978   Ruben Garcia   LAPAROSCOPIC CHOLECYSTECTOMY  1982   rotator cuff surgery Right 05/14/2016   VASECTOMY  1978       Home Medications    Prior to Admission medications   Medication Sig Start Date End Date Taking? Authorizing Provider  albuterol (VENTOLIN HFA) 108 (90 Base) MCG/ACT inhaler Inhale 1-2 puffs into the lungs every 6 (six) hours as needed. 09/11/23  Yes Wellington Half, NP  azithromycin (ZITHROMAX) 250 MG tablet Take 1 tablet (250 mg total) by mouth daily. Take first 2 tablets together, then 1 every day until finished. 09/11/23  Yes Wellington Half, NP  aspirin EC 81 MG tablet Take 81 mg by mouth daily.    [provider]  benzonatate  (TESSALON ) 100 MG capsule Take 1-2 tablets 3 times a day as needed for cough Patient not taking: Reported on 09/11/2023 04/24/22   Immordino, Mara [place], FNP  Cyanocobalamin (VITAMIN B 12) 500 MCG TABS Take 100 mg by mouth daily at 6 (six) AM.    [provider]  fluticasone  (FLONASE ) 50 MCG/ACT nasal spray Place 2 sprays into both nostrils daily. 02/21/21   [name], Angela M, MD  Garlic 10 MG CAPS Take 10  mg by mouth daily.     [provider]  hydrocortisone 2.5 % cream SMARTSIG:1 Topical Daily 09/13/21   [provider]  ibuprofen  (ADVIL ,MOTRIN ) 200 MG tablet Take 400 mg by mouth every 8 (eight) hours as needed (for pain.).    [provider]  lisinopril  (ZESTRIL ) 40 MG tablet Take 1 tablet by mouth once daily 09/11/23   [name], Angela M, MD  meloxicam  (MOBIC ) 15 MG tablet Take 15 mg by mouth daily.    [provider]  Multiple Vitamins-Minerals (MULTIVITAMIN PO) Take by mouth daily.    [provider]  nystatin -triamcinolone  ointment (MYCOLOG) Apply 1 Application topically 2 (two) times daily. 09/23/22   Matilde Son A,  PA-C  Omega-3 Fatty Acids (FISH OIL CONCENTRATE) 1000 MG CAPS Take 1,000 mg by mouth daily.     [provider]  sildenafil  (VIAGRA ) 100 MG tablet Take 1 tablet (100 mg total) by mouth daily as needed for erectile dysfunction. Take two hours prior to intercourse on an empty stomach 09/13/19   McGowan, Cathleen Coach A, PA-C  simvastatin  (ZOCOR ) 20 MG tablet TAKE 1 TABLET BY MOUTH AT BEDTIME 05/18/23   [name], Angela M, MD  terbinafine  (LAMISIL ) 250 MG tablet Take 1 tablet (250 mg total) by mouth daily. 09/28/20   [name], Angela M, MD  triamcinolone  cream (KENALOG ) 0.1 % Apply 1 application topically 2 (two) times daily. 02/24/20   Wellington Half, FNP  vitamin E 400 UNIT capsule Take 400 Units by mouth daily.    [provider]    Family History Family History  Problem Relation Age of Onset   Heart disease Mother        first MI in 29s   Diabetes Mother    Hyperlipidemia Mother    Arthritis Mother        rheumatoid   Cancer Father        lung   Diabetes Father    Arthritis Sister    Hyperlipidemia Sister    Hyperlipidemia Sister    Hypertension Sister    Hyperlipidemia Sister    Kidney disease Neg Hx    Prostate cancer Neg Hx    Colon cancer Neg Hx     Social History Social History   Tobacco Use   Smoking status: Never   Smokeless tobacco: Never  Vaping Use   Vaping status: Never Used  Substance Use Topics   Alcohol use: Yes    Alcohol/week: 5.0 standard drinks of alcohol    Types: 5 Cans of beer per week    Comment: weekends beer, 4- 6 total Starlin Echevaria   Drug use: No     Allergies   Codeine   Review of Systems Review of Systems  Constitutional:  Negative for chills and fever.  HENT:  Negative for ear pain and sore throat.   Respiratory:  Positive for cough and wheezing. Negative for shortness of breath.      Physical Exam Triage Vital Signs ED Triage Vitals  Encounter Vitals Group     BP 09/11/23 0855 114/73     Systolic BP  Percentile --      Diastolic BP Percentile --      Pulse Rate 09/11/23 0855 82     Resp 09/11/23 0855 18     Temp 09/11/23 0855 98.2 F (36.8 C)     Temp src --      SpO2 09/11/23 0855 95 %     Weight --  Height --      Head Circumference --      Peak Flow --      Pain Score 09/11/23 0840 0     Pain Loc --      Pain Education --      Exclude from Growth Chart --    No data found.  Updated Vital Signs BP 114/73   Pulse 82   Temp 98.2 F (36.8 C)   Resp 18   SpO2 95%   Visual Acuity Right Eye Distance:   Left Eye Distance:   Bilateral Distance:    Right Eye Near:   Left Eye Near:    Bilateral Near:     Physical Exam Constitutional:      General: He is not in acute distress. HENT:     Right Ear: Tympanic membrane normal.     Left Ear: Tympanic membrane normal.     Nose: Nose normal.     Mouth/Throat:     Mouth: Mucous membranes are moist.     Pharynx: Oropharynx is clear.     Comments: PND Cardiovascular:     Rate and Rhythm: Normal rate and regular rhythm.     Heart sounds: Normal heart sounds.  Pulmonary:     Effort: Pulmonary effort is normal. No respiratory distress.     Breath sounds: Normal breath sounds. No wheezing.  Neurological:     Mental Status: He is alert.      UC Treatments / Results  Labs (all labs ordered are listed, but only abnormal results are displayed) Labs Reviewed - No data to display  EKG   Radiology No results found.  Procedures Procedures (including critical care time)  Medications Ordered in UC Medications - No data to display  Initial Impression / Assessment and Plan / UC Course  I have reviewed the triage vital signs and the nursing notes.  Pertinent labs & imaging results that were available during my care of the patient were reviewed by me and considered in my medical decision making (see chart for details).    Acute upper respiratory infection.  Patient has been symptomatic for a month.  Treating today  with Zithromax and albuterol inhaler.  Instructed him to follow-up with his PCP next week.  ED precautions given.  Education provided on URI.  He agrees to plan of care.  Final Clinical Impressions(s) / UC Diagnoses   Final diagnoses:  Acute upper respiratory infection     Discharge Instructions      Take the Zithromax and use the albuterol inhaler as directed.  Follow up with your primary care provider next week.      ED Prescriptions     Medication Sig Dispense Auth. Provider   azithromycin (ZITHROMAX) 250 MG tablet Take 1 tablet (250 mg total) by mouth daily. Take first 2 tablets together, then 1 every day until finished. 6 tablet Wellington Half, NP   albuterol (VENTOLIN HFA) 108 (90 Base) MCG/ACT inhaler Inhale 1-2 puffs into the lungs every 6 (six) hours as needed. 18 g Wellington Half, NP      PDMP not reviewed this encounter.   Wellington Half, NP 09/11/23 [phone]

## 2023-09-11 NOTE — ED Triage Notes (Signed)
 Patient to Urgent Care with complaints of wheezing/ dry cough. Wakes up coughing/ symptoms worse at night time. Denies any known fevers.   Reports symptoms started one month ago.  Meds: no otc meds.

## 2023-09-11 NOTE — Discharge Instructions (Addendum)
 Take the Zithromax and use the albuterol inhaler as directed.  Follow up with your primary care provider next week.

## 2023-09-17 ENCOUNTER — Encounter: Payer: Self-pay | Admitting: Urology

## 2023-09-28 ENCOUNTER — Ambulatory Visit: Payer: Self-pay

## 2023-09-28 ENCOUNTER — Encounter: Payer: Self-pay | Admitting: Emergency Medicine

## 2023-09-28 ENCOUNTER — Ambulatory Visit
Admission: EM | Admit: 2023-09-28 | Discharge: 2023-09-28 | Disposition: A | Discharge status: Home / Self Care | Attending: Emergency Medicine | Admitting: Emergency Medicine

## 2023-09-28 DIAGNOSIS — J069 Acute upper respiratory infection, unspecified: Secondary | ICD-10-CM

## 2023-09-28 MED ORDER — PREDNISONE 10 MG (21) PO TBPK: ORAL_TABLET | Freq: Every day | ORAL | 0 refills | Status: DC

## 2023-09-28 MED ORDER — AMOXICILLIN-POT CLAVULANATE 875-125 MG PO TABS: 1.0000 | ORAL_TABLET | Freq: Two times a day (BID) | ORAL | 0 refills | Status: DC

## 2023-09-28 NOTE — ED Provider Notes (Signed)
 Ruben Garcia    CSN: [id number] Arrival date & time: 09/28/23  1445      History   Chief Complaint Chief Complaint  Patient presents with   Cough   Shortness of Breath    HPI Ruben Garcia is Garcia 78 y.o. male.   Patient presents for evaluation of, shortness of breath with exertion and wheezing at nighttime present for at least 1 month.  Was evaluated 2 weeks ago in this urgent care and prescribed azithromycin  and inhaler, took as directed without improvement.  Has not attempted treatment further.  Denies respiratory history, non-smoker.  Denies fever, congestion, ear pain, sore throat headaches or sinus pressure.    Past Medical History:  Diagnosis Date   Allergic rhinitis, cause unspecified    Basal cell carcinoma of skin, site unspecified 04/28/2010   Nasal; Teofilo Ruben Garcia.   BPH (benign prostatic hypertrophy)    Colon polyps    Essential hypertension, benign    Hemorrhoids, internal    Incomplete bladder emptying    Other abnormal glucose    Over weight    Personal history of colonic polyps    Personal history of other genital system and obstetric disorders(V13.29)    Pure hypercholesterolemia    Unspecified disorder of kidney and ureter    Unspecified disorder of liver     Patient Active Problem List   Diagnosis Date Noted   Prediabetes 02/03/2017   Obesity 02/03/2017   Full thickness rotator cuff tear 04/08/2016   Impingement syndrome of shoulder region 04/08/2016   Enlarged prostate with lower urinary tract symptoms (LUTS) 06/26/2015   Personal history of other malignant neoplasm of skin 12/11/2014   Essential (primary) hypertension 11/29/2012   Pure hypercholesterolemia 11/29/2012    Past Surgical History:  Procedure Laterality Date   BASAL CELL CARCINOMA EXCISION  2012   Facial        [place]   COLONOSCOPY  11/13/2010   single polyp; repeatin 5 years; Felicita Ruben Garcia   COLONOSCOPY N/Garcia 11/04/2021   Procedure: COLONOSCOPY;  Surgeon: Shane Darling,  MD;  Location: [facility] ENDOSCOPY;  Service: Endoscopy;  Laterality: N/Garcia;  REQUEST EARLY TIME   COLONOSCOPY WITH PROPOFOL  N/Garcia 12/13/2015   Procedure: COLONOSCOPY WITH PROPOFOL ;  Surgeon: Cassie Click, MD;  [facility]: [facility] ENDOSCOPY;  Service: Endoscopy;  Laterality: N/Garcia;   ear surgery  05/2012   [name].  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  Benign.   HEMORRHOID SURGERY  1978   Felipe Ruben Garcia   LAPAROSCOPIC CHOLECYSTECTOMY  1982   rotator cuff surgery Right 05/14/2016   VASECTOMY  1978       Home Medications    Prior to Admission medications   Medication Sig Start Date End Date Taking? Authorizing Provider  amoxicillin-clavulanate (AUGMENTIN) 875-125 MG tablet Take 1 tablet by mouth every 12 (twelve) hours. 09/28/23  Yes Jermiah Ruben Garcia R, NP  predniSONE  (STERAPRED UNI-PAK 21 TAB) 10 MG (21) TBPK tablet Take by mouth daily. Take 6 tabs by mouth daily  for 1 days, then 5 tabs for 1 days, then 4 tabs for 1 days, then 3 tabs for 1 days, 2 tabs for 1 days, then 1 tab by mouth daily for 1 days 09/28/23  Yes Shaianne Nucci R, NP  albuterol  (VENTOLIN  HFA) 108 (90 Base) MCG/ACT inhaler Inhale 1-2 puffs into the lungs every 6 (six) hours as needed. 09/11/23   Wellington Half, NP  aspirin EC 81 MG tablet Take 81 mg by mouth daily.    [provider]  azithromycin  (  ZITHROMAX ) 250 MG tablet Take 1 tablet (250 mg total) by mouth daily. Take first 2 tablets together, then 1 every day until finished. 09/11/23   Wellington Half, NP  benzonatate  (TESSALON ) 100 MG capsule Take 1-2 tablets 3 times Garcia day as needed for cough Patient not taking: Reported on 09/11/2023 04/24/22   Immordino, Mara [place], FNP  Cyanocobalamin (VITAMIN B 12) 500 MCG TABS Take 100 mg by mouth daily at 6 (six) AM.    [provider]  fluticasone  (FLONASE ) 50 MCG/ACT nasal spray Place 2 sprays into both nostrils daily. 02/21/21   [name], Angela M, MD  Garlic 10 MG CAPS Take 10 mg by mouth daily.     [provider]  hydrocortisone 2.5 % cream  SMARTSIG:1 Topical Daily 09/13/21   [provider]  ibuprofen  (ADVIL ,MOTRIN ) 200 MG tablet Take 400 mg by mouth every 8 (eight) hours as needed (for pain.).    [provider]  lisinopril  (ZESTRIL ) 40 MG tablet Take 1 tablet by mouth once daily 09/11/23   [name], Angela M, MD  meloxicam  (MOBIC ) 15 MG tablet Take 15 mg by mouth daily.    [provider]  Multiple Vitamins-Minerals (MULTIVITAMIN PO) Take by mouth daily.    [provider]  nystatin -triamcinolone  ointment (MYCOLOG) Apply 1 Application topically 2 (two) times daily. 09/23/22   Matilde Ruben A, PA-C  Omega-3 Fatty Acids (FISH OIL CONCENTRATE) 1000 MG CAPS Take 1,000 mg by mouth daily.     [provider]  sildenafil  (VIAGRA ) 100 MG tablet Take 1 tablet (100 mg total) by mouth daily as needed for erectile dysfunction. Take two hours prior to intercourse on an empty stomach 09/13/19   Ruben Garcia, Cathleen Coach A, PA-C  simvastatin  (ZOCOR ) 20 MG tablet TAKE 1 TABLET BY MOUTH AT BEDTIME 05/18/23   [name], Angela M, MD  terbinafine  (LAMISIL ) 250 MG tablet Take 1 tablet (250 mg total) by mouth daily. 09/28/20   [name], Angela M, MD  triamcinolone  cream (KENALOG ) 0.1 % Apply 1 application topically 2 (two) times daily. 02/24/20   Wellington Half, FNP  vitamin E 400 UNIT capsule Take 400 Units by mouth daily.    [provider]    Family History Family History  Problem Relation Age of Onset   Heart disease Mother        first MI in 45s   Diabetes Mother    Hyperlipidemia Mother    Arthritis Mother        rheumatoid   Cancer Father        lung   Diabetes Father    Arthritis Sister    Hyperlipidemia Sister    Hyperlipidemia Sister    Hypertension Sister    Hyperlipidemia Sister    Kidney disease Neg Hx    Prostate cancer Neg Hx    Colon cancer Neg Hx     Social History Social History   Tobacco Use   Smoking status: Never   Smokeless tobacco: Never  Vaping Use    Vaping status: Never Used  Substance Use Topics   Alcohol use: Yes    Alcohol/week: 5.0 standard drinks of alcohol    Types: 5 Cans of beer per week    Comment: weekends beer, 4- 6 total Starlin Echevaria   Drug use: No     Allergies   Codeine   Review of Systems Review of Systems   Physical Exam Triage Vital Signs ED Triage Vitals  Encounter Vitals Group     BP  09/28/23 1455 98/64     Systolic BP Percentile --      Diastolic BP Percentile --      Pulse Rate 09/28/23 1455 96     Resp 09/28/23 1455 18     Temp 09/28/23 1455 97.9 F (36.6 C)     Temp Source 09/28/23 1455 Oral     SpO2 09/28/23 1455 98 %     Weight --      Height --      Head Circumference --      Peak Flow --      Pain Score 09/28/23 1456 8     Pain Loc --      Pain Education --      Exclude from Growth Chart --    No data found.  Updated Vital Signs BP 98/64 (BP Location: Left Arm)   Pulse 96   Temp 97.9 F (36.6 C) (Oral)   Resp 18   SpO2 98%   Visual Acuity Right Eye Distance:   Left Eye Distance:   Bilateral Distance:    Right Eye Near:   Left Eye Near:    Bilateral Near:     Physical Exam Constitutional:      Appearance: Normal appearance.  Eyes:     Extraocular Movements: Extraocular movements intact.  Cardiovascular:     Rate and Rhythm: Normal rate and regular rhythm.     Pulses: Normal pulses.     Heart sounds: Normal heart sounds.  Pulmonary:     Effort: Pulmonary effort is normal.     Breath sounds: Normal breath sounds.  Neurological:     Mental Status: He is alert and oriented to person, place, and time. Mental status is at baseline.      UC Treatments / Results  Labs (all labs ordered are listed, but only abnormal results are displayed) Labs Reviewed - No data to display  EKG   Radiology No results found.  Procedures Procedures (including critical care time)  Medications Ordered in UC Medications - No data to display  Initial Impression / Assessment  and Plan / UC Course  I have reviewed the triage vital signs and the nursing notes.  Pertinent labs & imaging results that were available during my care of the patient were reviewed by me and considered in my medical decision making (see chart for details).  Acute URI  Vital signs are stable, O2 saturation 98% on room air, at this time lungs clear to auscultation, stable for outpatient management as patient is in no signs of distress nontoxic-appearing, x-ray imaging unavailable in clinic, discussed this with patient, will attempt use of additional antibiotic, prescribed Augmentin, prescribed prednisone  however patient hesitant to use, will initiate treatment with only antibiotic and if no improvement seen in 3 days we will start use of steroid, recommended over-the-counter medications and nonpharmacological medicines for additional treatment, has upcoming PCP appointment on June 12, advised to be rechecked at that time however if any worsening symptoms prior to may follow-up at urgent care for reevaluation Final Clinical Impressions(s) / UC Diagnoses   Final diagnoses:  Acute URI   Discharge Instructions      Today you was evaluated for your cough and shortness of breath, on exam at this time your lungs are clear and you are getting enough air without assistance  Will attempt an additional antibiotic for coverage of bacteria which most likely is causing symptoms to linger  Take Augmentin twice daily for 7 days  If no  improvement seen by day 3 of antibiotic use, initiate prednisone  which is Garcia steroid to open and relax the airway which should help to settle shortness of breath and wheezing   For cough: honey 1/2 to 1 teaspoon (you can dilute the honey in water or another fluid).  You can also use guaifenesin and dextromethorphan for cough. You can use Garcia humidifier for chest congestion and cough.  If you don't have Garcia humidifier, you can sit in the bathroom with the hot shower running.      It  is important to stay hydrated: drink plenty of fluids (water, gatorade/powerade/pedialyte, juices, or teas) to keep your throat moisturized and help further relieve irritation/discomfort.   ED Prescriptions     Medication Sig Dispense Auth. Provider   amoxicillin-clavulanate (AUGMENTIN) 875-125 MG tablet Take 1 tablet by mouth every 12 (twelve) hours. 14 tablet Chaniece Barbato R, NP   predniSONE  (STERAPRED UNI-PAK 21 TAB) 10 MG (21) TBPK tablet Take by mouth daily. Take 6 tabs by mouth daily  for 1 days, then 5 tabs for 1 days, then 4 tabs for 1 days, then 3 tabs for 1 days, 2 tabs for 1 days, then 1 tab by mouth daily for 1 days 21 tablet Ahmere Hemenway, Maybelle Spatz, NP      PDMP not reviewed this encounter.   Reena Canning, NP 09/28/23 1538

## 2023-09-28 NOTE — Telephone Encounter (Signed)
 Patient calling back after receiving notification that he had a message on MyChart but was unable to get onto MyChart. Patient endorses wanting to only see PCP. Please call patient at [phone].

## 2023-09-28 NOTE — Telephone Encounter (Signed)
 Chief Complaint: SOB Symptoms: Cough, wheezing, congestion Frequency: x 1 month Pertinent Negatives: Patient denies fever, CP Disposition: [] ED /[] Urgent Care (no appt availability in office) / [x] Appointment(In office/virtual)/ []  [facility]/ [] Home Care/ [] Refused Recommended Disposition /[] [facility]/ []  Follow-up with PCP Additional Notes: Pt reports he has been experiencing wheezing at night, cough, SOB x 1 month. Pt denies fever, CP. Pt reports symptoms worse at night, difficulty sleeping. OV offered, declines as he only wants to see PCP. Called [facility] x2, no answer. Offered OV with alt provider, continued to decline, requests OV with PCP. This RN educated pt on home care, new-worsening symptoms, when to call back/seek emergent care. Pt verbalized understanding and agrees to plan.    Copied from CRM [id number]. Topic: Clinical - Red Word Triage >> Sep 28, 2023  8:01 AM Rosaria Common wrote: Red Word that prompted transfer to Nurse Triage: Pt calling due to SOB. Reason for Disposition  [1] MILD difficulty breathing (e.g., minimal/no SOB at rest, SOB with walking, pulse <100) AND [2] NEW-onset or WORSE than normal  Answer Assessment - Initial Assessment Questions 1. RESPIRATORY STATUS: "Describe your breathing?" (e.g., wheezing, shortness of breath, unable to speak, severe coughing)      Shortness of Breath  2. ONSET: "When did this breathing problem begin?"      About 1 month 3. PATTERN "Does the difficult breathing come and go, or has it been constant since it started?"      Intermittent 4. SEVERITY: "How bad is your breathing?" (e.g., mild, moderate, severe)    - MILD: No SOB at rest, mild SOB with walking, speaks normally in sentences, can lie down, no retractions, pulse < 100.    - MODERATE: SOB at rest, SOB with minimal exertion and prefers to sit, cannot lie down flat, speaks in phrases, mild retractions, audible wheezing, pulse 100-120.    - SEVERE: Very SOB at  rest, speaks in single words, struggling to breathe, sitting hunched forward, retractions, pulse > 120      Moderate 8. CAUSE: "What do you think is causing the breathing problem?"      Unknown 9. OTHER SYMPTOMS: "Do you have any other symptoms? (e.g., dizziness, runny nose, cough, chest pain, fever)     Wheezing, cough  Protocols used: Breathing Difficulty-A-AH

## 2023-09-28 NOTE — ED Triage Notes (Signed)
 Patient complains of SOB, cough and body aches x 1 month. Patient rates aches 8/10. Patient has only taken Ibuprofen .

## 2023-09-28 NOTE — Discharge Instructions (Signed)
 Today you was evaluated for your cough and shortness of breath, on exam at this time your lungs are clear and you are getting enough air without assistance  Will attempt an additional antibiotic for coverage of bacteria which most likely is causing symptoms to linger  Take Augmentin twice daily for 7 days  If no improvement seen by day 3 of antibiotic use, initiate prednisone  which is a steroid to open and relax the airway which should help to settle shortness of breath and wheezing   For cough: honey 1/2 to 1 teaspoon (you can dilute the honey in water or another fluid).  You can also use guaifenesin and dextromethorphan for cough. You can use a humidifier for chest congestion and cough.  If you don't have a humidifier, you can sit in the bathroom with the hot shower running.      It is important to stay hydrated: drink plenty of fluids (water, gatorade/powerade/pedialyte, juices, or teas) to keep your throat moisturized and help further relieve irritation/discomfort.

## 2023-09-30 ENCOUNTER — Other Ambulatory Visit: Payer: Self-pay

## 2023-09-30 DIAGNOSIS — N138 Other obstructive and reflux uropathy: Secondary | ICD-10-CM

## 2023-10-01 ENCOUNTER — Ambulatory Visit: Payer: Self-pay | Admitting: Family Medicine

## 2023-10-05 ENCOUNTER — Other Ambulatory Visit: Payer: Self-pay

## 2023-10-06 ENCOUNTER — Ambulatory Visit: Admitting: Urology

## 2023-10-08 ENCOUNTER — Encounter: Payer: Self-pay | Admitting: Family Medicine

## 2023-10-08 ENCOUNTER — Ambulatory Visit (INDEPENDENT_AMBULATORY_CARE_PROVIDER_SITE_OTHER): Admitting: Family Medicine

## 2023-10-08 VITALS — BP 119/83 | HR 83 | Ht 72.0 in | Wt 219.6 lb

## 2023-10-08 DIAGNOSIS — I10 Essential (primary) hypertension: Secondary | ICD-10-CM

## 2023-10-08 DIAGNOSIS — E7800 Pure hypercholesterolemia, unspecified: Secondary | ICD-10-CM | POA: Diagnosis not present

## 2023-10-08 DIAGNOSIS — N401 Enlarged prostate with lower urinary tract symptoms: Secondary | ICD-10-CM | POA: Diagnosis not present

## 2023-10-08 DIAGNOSIS — R739 Hyperglycemia, unspecified: Secondary | ICD-10-CM | POA: Diagnosis not present

## 2023-10-08 NOTE — Progress Notes (Signed)
 Established Patient Office Visit  Subjective   Patient ID: Ruben Garcia, male    DOB: April 09, 1946  Age: 78 y.o. MRN: [id number]  Chief Complaint  Patient presents with   Medical Management of Chronic Issues    Patient reports he is taking medications as prescribed   Hypertension    No smoking. Monitors Bp at home. Not taking anything currently to effect readings. No symptoms to report   Hyperlipidemia   Ruben Garcia is a 78 y/o patient presenting today for chronic disease follow-up including hypertension and hyperlipidemia. He has had no issues taking his medications and has not experienced any negative side effects. He is walking frequently for exercise and eating a good diet. He denies any chest pain, shortness of breath, syncopal episodes, unintentional weight loss, or dizziness.   A month ago, he did have an upper respiratory infection and was initially prescribed Azithromycin  which did not seem to help him much. He continued to have cough and body aches during the month of May which he tried Ibuprofen  for but did not help much. He went back to urgent care on 6/2 and was prescribed Augmentin  and prednisone  which have helped him significantly. He is feeling much better and is not having any shortness of breath or current complaints today other than a mild residual cough.    Past Medical History:  Diagnosis Date   Allergic rhinitis, cause unspecified    Basal cell carcinoma of skin, site unspecified 04/28/2010   Nasal; Teofilo Fellers.   BPH (benign prostatic hypertrophy)    Colon polyps    Essential hypertension, benign    Hemorrhoids, internal    Incomplete bladder emptying    Other abnormal glucose    Over weight    Personal history of colonic polyps    Personal history of other genital system and obstetric disorders(V13.29)    Pure hypercholesterolemia    Unspecified disorder of kidney and ureter    Unspecified disorder of liver    Past Surgical History:  Procedure  Laterality Date   BASAL CELL CARCINOMA EXCISION  2012   Facial        [place]   COLONOSCOPY  11/13/2010   single polyp; repeatin 5 years; Felicita Horns   COLONOSCOPY N/A 11/04/2021   Procedure: COLONOSCOPY;  Surgeon: Shane Darling, MD;  Location: [facility] ENDOSCOPY;  Service: Endoscopy;  Laterality: N/A;  REQUEST EARLY TIME   COLONOSCOPY WITH PROPOFOL  N/A 12/13/2015   Procedure: COLONOSCOPY WITH PROPOFOL ;  Surgeon: Cassie Click, MD;  Location: [facility] ENDOSCOPY;  Service: Endoscopy;  Laterality: N/A;   ear surgery  05/2012   [name].  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  Benign.   HEMORRHOID SURGERY  1978   Felipe Horton   LAPAROSCOPIC CHOLECYSTECTOMY  1982   rotator cuff surgery Right 05/14/2016   VASECTOMY  1978      Review of Systems  Constitutional:  Negative for chills and fever.  HENT: Negative.    Eyes: Negative.   Respiratory:  Positive for cough. Negative for shortness of breath and wheezing.   Cardiovascular:  Negative for chest pain and leg swelling.  Gastrointestinal: Negative.   Genitourinary: Negative.   Musculoskeletal:  Negative for myalgias.  Skin: Negative.   Neurological:  Negative for dizziness and weakness.  Endo/Heme/Allergies: Negative.   Psychiatric/Behavioral: Negative.        Objective:     BP 119/83 (BP Location: Left Arm, Patient Position: Sitting, Cuff Size: Normal)   Pulse 83   Ht 6' (1.829 m)  Wt 219 lb 9.6 oz (99.6 kg)   BMI 29.78 kg/m  BP Readings from Last 3 Encounters:  10/08/23 119/83  09/28/23 98/64  09/11/23 114/73   Wt Readings from Last 3 Encounters:  10/08/23 219 lb 9.6 oz (99.6 kg)  03/31/23 221 lb 4.8 oz (100.4 kg)  10/09/22 220 lb (99.8 kg)      Physical Exam Constitutional:      General: He is not in acute distress.    Appearance: Normal appearance. He is normal weight.  HENT:     Head: Normocephalic and atraumatic.     Right Ear: External ear normal.     Left Ear: External ear normal.     Nose: Nose normal.     Mouth/Throat:      Mouth: Mucous membranes are dry.     Pharynx: Oropharynx is clear. No oropharyngeal exudate.   Eyes:     General: No scleral icterus.    Extraocular Movements: Extraocular movements intact.     Conjunctiva/sclera: Conjunctivae normal.     Pupils: Pupils are equal, round, and reactive to light.    Cardiovascular:     Rate and Rhythm: Normal rate and regular rhythm.     Pulses: Normal pulses.     Heart sounds: No murmur heard.    No friction rub. No gallop.  Pulmonary:     Effort: Pulmonary effort is normal. No respiratory distress.     Breath sounds: Normal breath sounds. No wheezing or rales.  Abdominal:     General: There is no distension.     Palpations: Abdomen is soft.     Tenderness: There is no abdominal tenderness.   Musculoskeletal:        General: Normal range of motion.     Cervical back: Normal range of motion.     Right lower leg: No edema.     Left lower leg: No edema.   Skin:    General: Skin is warm and dry.     Coloration: Skin is not jaundiced.   Neurological:     General: No focal deficit present.     Mental Status: He is alert and oriented to person, place, and time. Mental status is at baseline.     Motor: No weakness.   Psychiatric:        Mood and Affect: Mood normal.        Behavior: Behavior normal.        Thought Content: Thought content normal.     No results found for any visits on 10/08/23.  Last metabolic panel Lab Results  Component Value Date   GLUCOSE 103 (H) 03/31/2023   NA 143 03/31/2023   K 4.9 03/31/2023   CL 103 03/31/2023   CO2 26 03/31/2023   BUN 11 03/31/2023   CREATININE 1.21 03/31/2023   EGFR 62 03/31/2023   CALCIUM 10.0 03/31/2023   PROT 7.4 03/31/2023   ALBUMIN 4.7 03/31/2023   LABGLOB 2.7 03/31/2023   AGRATIO 1.9 09/12/2022   BILITOT 1.9 (H) 03/31/2023   BILITOT 1.7 (H) 03/31/2023   ALKPHOS 52 03/31/2023   AST 34 03/31/2023   ALT 38 03/31/2023   Last lipids Lab Results  Component Value Date   CHOL 189  03/31/2023   HDL 45 03/31/2023   LDLCALC 111 (H) 03/31/2023   TRIG 192 (H) 03/31/2023   CHOLHDL 4.2 03/31/2023   Last hemoglobin A1c Lab Results  Component Value Date   HGBA1C 5.5 03/31/2023      The  10-year ASCVD risk score (Arnett DK, et al., 2019) is: 25.8%    Assessment & Plan:   Problem List Items Addressed This Visit       Cardiovascular and Mediastinum   Essential (primary) hypertension - Primary   Hypertension  well-controlled with lisinopril  40 mg daily. Patient is monitoring regularly at home and not experiencing any hypotensive episodes. 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 -Recheck PSA today so he doesn't need to get blood drawn again at a subsequent appointment      Relevant Orders   PSA Total (Reflex To Free)     Other   Pure hypercholesterolemia   Hyperlipidemia is well managed with simvastatin  20 mg daily. He is not having any myalgias and has tolerated the medications. Prior levels were within an acceptable range. Patient continues to exercise in the form of regular walking. - Continue simvastatin  20 mg daily -  lipid panel ordered        Relevant Orders   Comprehensive Metabolic Panel (CMET)   Lipid Panel With LDL/HDL Ratio   Hyperglycemia   Patient was previously in the prediabetes range but has had non-elevated A1c for almost 3 years now. Occasional hyperglycemia on labs but normal A1c.  -Will recheck one more time today without need for regular A1c screening in the future unless indicated.      Relevant Orders   Hemoglobin A1c    Return in about 6 months (around 04/08/2024) for CPE.    Monda Angry, Medical Student   Patient seen along with MS3 student, Ruben Garcia. I  personally evaluated this patient along with the student, and verified all aspects of the history, physical exam, and medical decision making as documented by the student. I agree with the student's documentation and have made all necessary edits.  Baptiste Littler, Stan Eans, MD, MPH [facility]

## 2023-10-08 NOTE — Assessment & Plan Note (Signed)
 Patient was previously in the prediabetes range but has had non-elevated A1c for almost 3 years now. Occasional hyperglycemia on labs but normal A1c.  -Will recheck one more time today without need for regular A1c screening in the future unless indicated.

## 2023-10-08 NOTE — Assessment & Plan Note (Signed)
 Hyperlipidemia is well managed with simvastatin  20 mg daily. He is not having any myalgias and has tolerated the medications. Prior levels were within an acceptable range. Patient continues to exercise in the form of regular walking. - Continue simvastatin  20 mg daily -  lipid panel ordered

## 2023-10-08 NOTE — Assessment & Plan Note (Signed)
 Chronic and stable; Not on any medications and is followed by urology. -Recheck PSA today so he doesn't need to get blood drawn again at a subsequent appointment

## 2023-10-08 NOTE — Assessment & Plan Note (Signed)
 Hypertension  well-controlled with lisinopril  40 mg daily. Patient is monitoring regularly at home and not experiencing any hypotensive episodes. - Continue lisinopril  40 mg daily - continue to monitor blood pressure at home

## 2023-10-09 ENCOUNTER — Ambulatory Visit: Payer: Self-pay | Admitting: Urology

## 2023-10-09 LAB — COMPREHENSIVE METABOLIC PANEL WITH GFR
ALT: 57 IU/L — ABNORMAL HIGH (ref 0–44)
AST: 41 IU/L — ABNORMAL HIGH (ref 0–40)
Albumin: 4.3 g/dL (ref 3.8–4.8)
Alkaline Phosphatase: 166 IU/L — ABNORMAL HIGH (ref 44–121)
BUN/Creatinine Ratio: 15 (ref 10–24)
BUN: 21 mg/dL (ref 8–27)
Bilirubin Total: 0.9 mg/dL (ref 0.0–1.2)
CO2: 22 mmol/L (ref 20–29)
Calcium: 10.1 mg/dL (ref 8.6–10.2)
Chloride: 103 mmol/L (ref 96–106)
Creatinine, Ser: 1.37 mg/dL — ABNORMAL HIGH (ref 0.76–1.27)
Globulin, Total: 2.9 g/dL (ref 1.5–4.5)
Glucose: 89 mg/dL (ref 70–99)
Potassium: 5.6 mmol/L — ABNORMAL HIGH (ref 3.5–5.2)
Sodium: 141 mmol/L (ref 134–144)
Total Protein: 7.2 g/dL (ref 6.0–8.5)
eGFR: 54 mL/min/{1.73_m2} — ABNORMAL LOW (ref >59)

## 2023-10-09 LAB — LIPID PANEL WITH LDL/HDL RATIO
Cholesterol, Total: 150 mg/dL (ref 100–199)
HDL: 28 mg/dL — ABNORMAL LOW (ref >39)
LDL Chol Calc (NIH): 102 mg/dL — ABNORMAL HIGH (ref 0–99)
LDL/HDL Ratio: 3.6 ratio (ref 0.0–3.6)
Triglycerides: 109 mg/dL (ref 0–149)
VLDL Cholesterol Cal: 20 mg/dL (ref 5–40)

## 2023-10-09 LAB — PSA TOTAL (REFLEX TO FREE): Prostate Specific Ag, Serum: 0.7 ng/mL (ref 0.0–4.0)

## 2023-10-09 LAB — HEMOGLOBIN A1C
Est. average glucose Bld gHb Est-mCnc: 111 mg/dL
Hgb A1c MFr Bld: 5.5 % (ref 4.8–5.6)

## 2023-10-12 ENCOUNTER — Other Ambulatory Visit: Payer: Self-pay

## 2023-10-12 ENCOUNTER — Ambulatory Visit: Payer: Self-pay | Admitting: Family Medicine

## 2023-10-12 DIAGNOSIS — R7989 Other specified abnormal findings of blood chemistry: Secondary | ICD-10-CM

## 2023-10-12 DIAGNOSIS — N289 Disorder of kidney and ureter, unspecified: Secondary | ICD-10-CM

## 2023-10-12 MED ORDER — ROSUVASTATIN CALCIUM 5 MG PO TABS: 5.0000 mg | ORAL_TABLET | Freq: Every day | ORAL | 1 refills | Status: DC

## 2023-10-14 ENCOUNTER — Ambulatory Visit: Payer: Self-pay

## 2023-10-14 NOTE — Telephone Encounter (Signed)
 FYI Only or Action Required?: Action required by provider  Patient was last seen in primary care on 10/08/2023 by Mazie Speed, MD. Called Nurse Triage reporting Back Pain. Symptoms began several weeks ago. Interventions attempted: Nothing. Symptoms are: gradually worsening.  Triage Disposition: See PCP Within 2 Weeks  Patient/caregiver understands and will follow disposition?: Yes   Copied from CRM [id number]. Topic: Clinical - Red Word Triage >> Oct 14, 2023 10:50 AM Chrystal Crape R wrote: Pt feels week and has muscle aches. He states he felt rushed at his last appointment and didn't have a chance to tell the dr as she had students with her. He has been seen in urgent care for the same symptoms once in May and another in June Reason for Disposition  Weakness is a chronic symptom (recurrent or ongoing AND present > 4 weeks)    Pt does not not to come back into office, request response for medication for back pain.  Answer Assessment - Initial Assessment Questions 1. DESCRIPTION: Describe how you are feeling.     Constant aches on mid back and both hip joints and shoulders 2. SEVERITY: How bad is it?  Can you stand and walk?   - MILD (0-3): Feels weak or tired, but does not interfere with work, school or normal activities.   - MODERATE (4-7): Able to stand and walk; weakness interferes with work, school, or normal activities.   - SEVERE (8-10): Unable to stand or walk; unable to do usual activities.     8-9/10 3. ONSET: When did these symptoms begin? (e.g., hours, days, weeks, months)     6 weeks now 4. CAUSE: What do you think is causing the weakness or fatigue? (e.g., not drinking enough fluids, medical problem, trouble sleeping)     unknown 5. NEW MEDICINES:  Have you started on any new medicines recently? (e.g., opioid pain medicines, benzodiazepines, muscle relaxants, antidepressants, antihistamines, neuroleptics, beta blockers)     Pt states he cannot take anything for the  pain, states cannot take Ibuprofen  6. OTHER SYMPTOMS: Do you have any other symptoms? (e.g., chest pain, fever, cough, SOB, vomiting, diarrhea, bleeding, other areas of pain)     Spine & hips ache  Had a URI was treated with viral pack, Zpack. Then was treated for a bacterial infection and steriod. Pt states he does not want to come back in for another appmt but would like if the doctor could please prescribe something for his back pain. He was told previously he cannot take ibuprofen  or aspirin, will try tylenol  but would still lie doctor to send in a new medication for the increased back pain.  Pt states best number to contact him is [phone]  Protocols used: Weakness (Generalized) and Fatigue-A-AH

## 2023-10-15 NOTE — Telephone Encounter (Signed)
 Happy to see him and evaluate.  Can use an acute/SD slot if needed

## 2023-10-16 ENCOUNTER — Emergency Department

## 2023-10-16 ENCOUNTER — Ambulatory Visit: Admission: EM | Admit: 2023-10-16 | Discharge: 2023-10-16 | Disposition: A | Discharge status: Home / Self Care

## 2023-10-16 ENCOUNTER — Emergency Department
Admission: EM | Admit: 2023-10-16 | Discharge: 2023-10-16 | Disposition: A | Discharge status: Home / Self Care | Attending: Emergency Medicine | Admitting: Emergency Medicine

## 2023-10-16 ENCOUNTER — Other Ambulatory Visit: Payer: Self-pay

## 2023-10-16 ENCOUNTER — Encounter: Payer: Self-pay | Admitting: Emergency Medicine

## 2023-10-16 DIAGNOSIS — R531 Weakness: Secondary | ICD-10-CM

## 2023-10-16 DIAGNOSIS — N179 Acute kidney failure, unspecified: Secondary | ICD-10-CM

## 2023-10-16 DIAGNOSIS — I10 Essential (primary) hypertension: Secondary | ICD-10-CM | POA: Insufficient documentation

## 2023-10-16 LAB — URINALYSIS, ROUTINE W REFLEX MICROSCOPIC
Bilirubin Urine: NEGATIVE
Glucose, UA: NEGATIVE mg/dL
Hgb urine dipstick: NEGATIVE
Ketones, ur: NEGATIVE mg/dL
Leukocytes,Ua: NEGATIVE
Nitrite: NEGATIVE
Protein, ur: NEGATIVE mg/dL
Specific Gravity, Urine: 1.006 (ref 1.005–1.030)
pH: 5 (ref 5.0–8.0)

## 2023-10-16 LAB — DIFFERENTIAL
Abs Immature Granulocytes: 0.13 10*3/uL — ABNORMAL HIGH (ref 0.00–0.07)
Basophils Absolute: 0.1 10*3/uL (ref 0.0–0.1)
Basophils Relative: 0 %
Eosinophils Absolute: 0 10*3/uL (ref 0.0–0.5)
Eosinophils Relative: 0 %
Immature Granulocytes: 1 %
Lymphocytes Relative: 17 %
Lymphs Abs: 1.9 10*3/uL (ref 0.7–4.0)
Monocytes Absolute: 1.3 10*3/uL — ABNORMAL HIGH (ref 0.1–1.0)
Monocytes Relative: 11 %
Neutro Abs: 8 10*3/uL — ABNORMAL HIGH (ref 1.7–7.7)
Neutrophils Relative %: 71 %

## 2023-10-16 LAB — COMPREHENSIVE METABOLIC PANEL WITH GFR
ALT: 26 U/L (ref 0–44)
AST: 31 U/L (ref 15–41)
Albumin: 3.9 g/dL (ref 3.5–5.0)
Alkaline Phosphatase: 114 U/L (ref 38–126)
Anion gap: 11 (ref 5–15)
BUN: 22 mg/dL (ref 8–23)
CO2: 23 mmol/L (ref 22–32)
Calcium: 9.8 mg/dL (ref 8.9–10.3)
Chloride: 99 mmol/L (ref 98–111)
Creatinine, Ser: 1.77 mg/dL — ABNORMAL HIGH (ref 0.61–1.24)
GFR, Estimated: 40 mL/min — ABNORMAL LOW (ref >60)
Glucose, Bld: 137 mg/dL — ABNORMAL HIGH (ref 70–99)
Potassium: 4.6 mmol/L (ref 3.5–5.1)
Sodium: 133 mmol/L — ABNORMAL LOW (ref 135–145)
Total Bilirubin: 1.8 mg/dL — ABNORMAL HIGH (ref 0.0–1.2)
Total Protein: 7.6 g/dL (ref 6.5–8.1)

## 2023-10-16 LAB — CBC
HCT: 37.9 % — ABNORMAL LOW (ref 39.0–52.0)
Hemoglobin: 12.6 g/dL — ABNORMAL LOW (ref 13.0–17.0)
MCH: 31.3 pg (ref 26.0–34.0)
MCHC: 33.2 g/dL (ref 30.0–36.0)
MCV: 94.3 fL (ref 80.0–100.0)
Platelets: 344 10*3/uL (ref 150–400)
RBC: 4.02 MIL/uL — ABNORMAL LOW (ref 4.22–5.81)
RDW: 12 % (ref 11.5–15.5)
WBC: 11.4 10*3/uL — ABNORMAL HIGH (ref 4.0–10.5)
nRBC: 0 % (ref 0.0–0.2)

## 2023-10-16 LAB — APTT: aPTT: 33 s (ref 24–36)

## 2023-10-16 LAB — CK: Total CK: 30 U/L — ABNORMAL LOW (ref 49–397)

## 2023-10-16 LAB — ETHANOL: Alcohol, Ethyl (B): <15 mg/dL (ref <15)

## 2023-10-16 LAB — PROTIME-INR
INR: 1 (ref 0.8–1.2)
Prothrombin Time: 12.9 s (ref 11.4–15.2)

## 2023-10-16 MED ORDER — LACTATED RINGERS IV BOLUS
1000.0000 mL | Freq: Once | INTRAVENOUS | Status: AC
  Administered 2023-10-16: 1000 mL via INTRAVENOUS

## 2023-10-16 MED ORDER — SODIUM CHLORIDE 0.9% FLUSH: 3.0000 mL | Freq: Once | INTRAVENOUS | Status: DC

## 2023-10-16 MED ORDER — LIDOCAINE 5 % EX PTCH
1.0000 | MEDICATED_PATCH | CUTANEOUS | Status: DC
  Administered 2023-10-16: 1 via TRANSDERMAL
  Filled 2023-10-16: qty 1

## 2023-10-16 MED ORDER — LIDOCAINE 5 % EX PTCH: 1.0000 | MEDICATED_PATCH | Freq: Two times a day (BID) | CUTANEOUS | 0 refills | Status: DC

## 2023-10-16 NOTE — ED Triage Notes (Signed)
 Pt to ED via POV for weakness for several weeks. Pt states that he has been tested for viral and bacterial illnesses. Pt states that he has spells where he feels like he is going to pass out. Pt thought it may be related to his blood sugar but states that he has not had issues with his blood sugar in the past. Pt states that his joints ache. Pt denies recent tick bites. Pt is in NAD.

## 2023-10-16 NOTE — ED Provider Notes (Signed)
 Care assumed of patient from outgoing provider.  See their note for initial history, exam and plan.  Clinical Course as of 10/16/23 1619  Fri Oct 16, 2023  1507 Generalized weakness and not feeling well for a couple of weeks.  No chest pain or dyspnea. Mild leukocytosis.  Cr 1.77 with mild aki. Given IVF.  CK neg.  W/u negative. Likely will dc home w/ pcp follow up. Plan to follow up CXR and UA [SM]    Clinical Course User Index [SM] Viviano Ground, MD  Chest x-ray no signs of pneumonia.  UA with no signs of urinary tract infection.  Patient was wanting to go home after completing IV fluid bolus with close follow-up with primary care physician to recheck creatinine.  UA with no signs of urinary tract infection.  Discharged home in stable condition with return precautions and discussed close follow-up with PCP.   Viviano Ground, MD 10/16/23 [phone]

## 2023-10-16 NOTE — ED Triage Notes (Signed)
 Pt arrived at [facility] with weakness, fatigue, shortness of breath, cough and body aches for x5 days. Pt reports being on recent abx and steroids over last few weeks, pt reports no relief. Pt alert. Pt able to stand and walk with steady gait. Provider recommends going to ER for further testing. Pt agreed and wanting to end visit with follow up in ER.

## 2023-10-16 NOTE — ED Provider Notes (Signed)
 [facility] Provider Note    Event Date/Time   First MD Initiated Contact with Patient 10/16/23 1335     (approximate)   History   Chief Complaint Weakness   HPI  Ruben Garcia is a 78 y.o. male with past medical history of hypertension and hyperlipidemia who presents to the ED complaining of weakness.  Patient reports that for about the past 6 weeks he has been experiencing generalized weakness with malaise and joint pains.  He denies any fevers, cough, chest pain, or shortness of breath.  He has not had any nausea, vomiting, diarrhea, or dysuria.  He reports seeing his PCP for this problem about 2 weeks ago with no clear cause identified.  He has not noticed any redness or swelling to any of his joints.  He denies any recent changes to his medications.     Physical Exam   Triage Vital Signs: ED Triage Vitals  Encounter Vitals Group     BP 10/16/23 1322 126/63     Girls Systolic BP Percentile --      Girls Diastolic BP Percentile --      Boys Systolic BP Percentile --      Boys Diastolic BP Percentile --      Pulse Rate 10/16/23 1322 (!) 107     Resp 10/16/23 1322 16     Temp 10/16/23 1322 98.5 F (36.9 C)     Temp src --      SpO2 10/16/23 1322 97 %     Weight 10/16/23 1323 210 lb (95.3 kg)     Height 10/16/23 1323 6' (1.829 m)     Head Circumference --      Peak Flow --      Pain Score 10/16/23 1323 5     Pain Loc --      Pain Education --      Exclude from Growth Chart --     Most recent vital signs: Vitals:   10/16/23 1322 10/16/23 1349  BP: 126/63 130/79  Pulse: (!) 107 96  Resp: 16 (!) 22  Temp: 98.5 F (36.9 C)   SpO2: 97% 94%    Constitutional: Alert and oriented. Eyes: Conjunctivae are normal. Head: Atraumatic. Nose: No congestion/rhinnorhea. Mouth/Throat: Mucous membranes are moist.  Cardiovascular: Normal rate, regular rhythm. Grossly normal heart sounds.  2+ radial pulses bilaterally. Respiratory: Normal  respiratory effort.  No retractions. Lungs CTAB. Gastrointestinal: Soft and nontender. No distention. Musculoskeletal: No lower extremity tenderness nor edema.  Neurologic:  Normal speech and language. No gross focal neurologic deficits are appreciated.    ED Results / Procedures / Treatments   Labs (all labs ordered are listed, but only abnormal results are displayed) Labs Reviewed  CBC - Abnormal; Notable for the following components:      Result Value   WBC 11.4 (*)    RBC 4.02 (*)    Hemoglobin 12.6 (*)    HCT 37.9 (*)    All other components within normal limits  DIFFERENTIAL - Abnormal; Notable for the following components:   Neutro Abs 8.0 (*)    Monocytes Absolute 1.3 (*)    Abs Immature Granulocytes 0.13 (*)    All other components within normal limits  COMPREHENSIVE METABOLIC PANEL WITH GFR - Abnormal; Notable for the following components:   Sodium 133 (*)    Glucose, Bld 137 (*)    Creatinine, Ser 1.77 (*)    Total Bilirubin 1.8 (*)    GFR, Estimated  40 (*)    All other components within normal limits  CK - Abnormal; Notable for the following components:   Total CK 30 (*)    All other components within normal limits  PROTIME-INR  APTT  ETHANOL  URINALYSIS, ROUTINE W REFLEX MICROSCOPIC  CBG MONITORING, ED     EKG  ED ECG REPORT I, Twilla Galea, the attending physician, personally viewed and interpreted this ECG.   Date: 10/16/2023  EKG Time: 13:25  Rate: 109  Rhythm: sinus tachycardia  Axis: Normal  Intervals:none  ST&T Change: None  RADIOLOGY Chest x-ray reviewed and interpreted by me with no infiltrate, edema, or effusion.  PROCEDURES:  Critical Care performed: No  Procedures   MEDICATIONS ORDERED IN ED: Medications  sodium chloride  flush (NS) 0.9 % injection 3 mL (0 mLs Intravenous Hold 10/16/23 1336)  lactated ringers  bolus 1,000 mL (1,000 mLs Intravenous New Bag/Given 10/16/23 1512)     IMPRESSION / MDM / ASSESSMENT AND PLAN / ED  COURSE  I reviewed the triage vital signs and the nursing notes.                              78 y.o. male with past medical history of hypertension and hyperlipidemia who presents to the ED with 6 weeks of generalized weakness with malaise and joint pains.  Patient's presentation is most consistent with acute presentation with potential threat to life or bodily function.  Differential diagnosis includes, but is not limited to, viral syndrome, dehydration, electrolyte abnormality, AKI, anemia, rhabdomyolysis, UTI, pneumonia.  Patient nontoxic-appearing and in no acute distress, vital signs are unremarkable.  He reports some joint pains but no findings concerning for infectious process to his joints, will check chest x-ray and urinalysis for possible infection.  Labs with mild AKI but no significant anemia, leukocytosis, or electrolyte abnormality.  LFTs are unremarkable, CK level is also reassuring.  Patient given IV fluid bolus.  Patient turned over to oncoming provider pending urinalysis and chest x-ray results.  If these are unremarkable, he would be appropriate for discharge home with PCP follow-up for recheck of renal function.     FINAL CLINICAL IMPRESSION(S) / ED DIAGNOSES   Final diagnoses:  Generalized weakness  AKI (acute kidney injury) (HCC)     Rx / DC Orders   ED Discharge Orders     None        Note:  This document was prepared using Dragon voice recognition software and may include unintentional dictation errors.   Twilla Galea, MD 10/16/23 1537

## 2023-10-16 NOTE — Telephone Encounter (Signed)
 Noted

## 2023-10-22 NOTE — Progress Notes (Signed)
 10/26/23 3:26 PM   Ruben Garcia April 11, 1946 [id number]  Referring provider:  Myrla Jon HERO, MD [address] [place],  [place] [address]  Urological history  1. BPH with LU TS -PSA (09/2023) 0.7   2. ED -sildenafil  100 mg, on-demand-dosing   Chief Complaint  Patient presents with   Benign Prostatic Hypertrophy    HPI: Ruben Garcia is a 78 y.o.male who presents today for yearly follow up.   Previous records reviewed.     IPSS score: 6/1   Previous score: 5/0    Major complaint(s): Nocturia x 2 for 6 years. Denies any dysuria, hematuria or suprapubic pain.   Denies any recent fevers, chills, nausea or vomiting.  Serum creatinine (09/2023) 1.77, EGFR 40   Hemoglobin A1c (09/2023) 5.5  PSA (09/2023) 0.7  UA (09/2023) negative for nitrites, leukocytes, blood and bacteria     IPSS     Row Name 10/23/23 0800         International Prostate Symptom Score   How often have you had the sensation of not emptying your bladder? Not at All     How often have you had to urinate less than every two hours? Less than 1 in 5 times     How often have you found you stopped and started again several times when you urinated? Less than 1 in 5 times     How often have you found it difficult to postpone urination? Less than 1 in 5 times     How often have you had a weak urinary stream? Less than 1 in 5 times     How often have you had to strain to start urination? Not at All     How many times did you typically get up at night to urinate? 2 Times     Total IPSS Score 6       Quality of Life due to urinary symptoms   If you were to spend the rest of your life with your urinary condition just the way it is now how would you feel about that? Pleased        Score:  1-7 Mild 8-19 Moderate 20-35 Severe   SHIM score: 15    Previous SHIM score: 14 Main complaint: Achieving and maintaining an erection x 5 years Risk factors:  age, BPH, HTN, HLD and alcohol  consumption.    No painful erections or curvatures with his erections.    No longer having spontaneous erections.  Tried:   Sildenafil  100 mg on demand dosing    SHIM     Row Name 10/23/23 0809         SHIM: Over the last 6 months:   How do you rate your confidence that you could get and keep an erection? Moderate     When you had erections with sexual stimulation, how often were your erections hard enough for penetration (entering your partner)? Sometimes (about half the time)     During sexual intercourse, how often were you able to maintain your erection after you had penetrated (entered) your partner? Sometimes (about half the time)     During sexual intercourse, how difficult was it to maintain your erection to completion of intercourse? Difficult     When you attempted sexual intercourse, how often was it satisfactory for you? Sometimes (about half the time)       SHIM Total Score   SHIM 15  Score: 1-7 Severe ED 8-11 Moderate ED 12-16 Mild-Moderate ED 17-21 Mild ED 22-25 No ED    PMH: Past Medical History:  Diagnosis Date   Allergic rhinitis, cause unspecified    Basal cell carcinoma of skin, site unspecified 04/28/2010   Nasal; [place].   BPH (benign prostatic hypertrophy)    Colon polyps    Essential hypertension, benign    Hemorrhoids, internal    Incomplete bladder emptying    Other abnormal glucose    Over weight    Personal history of colonic polyps    Personal history of other genital system and obstetric disorders(V13.29)    Pure hypercholesterolemia    Unspecified disorder of kidney and ureter    Unspecified disorder of liver     Surgical History: Past Surgical History:  Procedure Laterality Date   BASAL CELL CARCINOMA EXCISION  2012   Facial        [place]   COLONOSCOPY  11/13/2010   single polyp; repeatin 5 years; [place]   COLONOSCOPY N/A 11/04/2021   Procedure: COLONOSCOPY;  Surgeon: Maryruth Ole DASEN, MD;  Location: [facility]  ENDOSCOPY;  Service: Endoscopy;  Laterality: N/A;  REQUEST EARLY TIME   COLONOSCOPY WITH PROPOFOL  N/A 12/13/2015   Procedure: COLONOSCOPY WITH PROPOFOL ;  Surgeon: Lamar DASEN Viktoria, MD;  Location: [facility] ENDOSCOPY;  Service: Endoscopy;  Laterality: N/A;   ear surgery  05/2012   [name].  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  Benign.   HEMORRHOID SURGERY  1978   [place]   LAPAROSCOPIC CHOLECYSTECTOMY  1982   rotator cuff surgery Right 05/14/2016   VASECTOMY  1978    Home Medications:  Allergies as of 10/23/2023       Reactions   Codeine Nausea Only        Medication List        Accurate as of October 23, 2023 11:59 PM. If you have any questions, ask your nurse or doctor.          albuterol  108 (90 Base) MCG/ACT inhaler Commonly known as: VENTOLIN  HFA Inhale 1-2 puffs into the lungs every 6 (six) hours as needed.   aspirin EC 81 MG tablet Take 81 mg by mouth daily.   Fish Oil Concentrate 1000 MG Caps Take 1,000 mg by mouth daily.   fluticasone  50 MCG/ACT nasal spray Commonly known as: FLONASE  Place 2 sprays into both nostrils daily.   Garlic 10 MG Caps Take 10 mg by mouth daily.   hydrocortisone 2.5 % cream SMARTSIG:1 Topical Daily   ibuprofen  200 MG tablet Commonly known as: ADVIL  Take 400 mg by mouth every 8 (eight) hours as needed (for pain.).   lidocaine  5 % Commonly known as: Lidoderm  Place 1 patch onto the skin every 12 (twelve) hours. Remove & Discard patch within 12 hours or as directed by MD   lisinopril  40 MG tablet Commonly known as: ZESTRIL  Take 1 tablet by mouth once daily   meloxicam  15 MG tablet Commonly known as: MOBIC  Take 15 mg by mouth daily.   MULTIVITAMIN PO Take by mouth daily.   nystatin -triamcinolone  ointment Commonly known as: MYCOLOG Apply 1 Application topically 2 (two) times daily.   rosuvastatin 5 MG tablet Commonly known as: Crestor Take 1 tablet (5 mg total) by mouth daily.   sildenafil  100 MG tablet Commonly known as: VIAGRA  Take 1  tablet (100 mg total) by mouth daily as needed for erectile dysfunction. Take two hours prior to intercourse on an empty stomach   simvastatin  20 MG tablet Commonly  known as: ZOCOR  TAKE 1 TABLET BY MOUTH AT BEDTIME   terbinafine  250 MG tablet Commonly known as: LAMISIL  Take 1 tablet (250 mg total) by mouth daily.   triamcinolone  cream 0.1 % Commonly known as: KENALOG  Apply 1 application topically 2 (two) times daily.   Vitamin B 12 500 MCG Tabs Take 100 mg by mouth daily at 6 (six) AM.   vitamin E 180 MG (400 UNITS) capsule Take 400 Units by mouth daily.        Allergies:  Allergies  Allergen Reactions   Codeine Nausea Only    Family History: Family History  Problem Relation Age of Onset   Heart disease Mother        first MI in 55s   Diabetes Mother    Hyperlipidemia Mother    Arthritis Mother        rheumatoid   Cancer Father        lung   Diabetes Father    Arthritis Sister    Hyperlipidemia Sister    Hyperlipidemia Sister    Hypertension Sister    Hyperlipidemia Sister    Kidney disease Neg Hx    Prostate cancer Neg Hx    Colon cancer Neg Hx     Social History:  reports that he has never smoked. He has never used smokeless tobacco. He reports current alcohol use of about 5.0 standard drinks of alcohol per week. He reports that he does not use drugs.   Physical Exam: BP 128/70   Pulse 74   Ht 6' (1.829 m)   Wt 210 lb (95.3 kg)   BMI 28.48 kg/m   Constitutional:  Well nourished. Alert and oriented, No acute distress. HEENT: [place] AT, moist mucus membranes.  Trachea midline Cardiovascular: No clubbing, cyanosis, or edema. Respiratory: Normal respiratory effort, no increased work of breathing. Neurologic: Grossly intact, no focal deficits, moving all 4 extremities. Psychiatric: Normal mood and affect.   Laboratory Data: See HPI and EPIC I have reviewed the labs.  See HPI.      Pertinent Imaging: N/A  Assessment & Plan:    1. BPH with LUTS -  PSA stable - continue conservative management, avoiding bladder irritants and timed voiding's  2. ED - continue sildenafil  100 mg, on-demand-dosing  3. Balanitis - resolved   Return in about 1 year (around 10/22/2024) for PSA,  IPSS, SHIM (PCP gets PSA) .  CLOTILDA HELON RIGGERS   [facility] [address], [address] [place], [place] [address] [phone]

## 2023-10-23 ENCOUNTER — Ambulatory Visit (INDEPENDENT_AMBULATORY_CARE_PROVIDER_SITE_OTHER): Admitting: Urology

## 2023-10-23 VITALS — BP 128/70 | HR 74 | Ht 72.0 in | Wt 210.0 lb

## 2023-10-23 DIAGNOSIS — N401 Enlarged prostate with lower urinary tract symptoms: Secondary | ICD-10-CM | POA: Diagnosis not present

## 2023-10-23 DIAGNOSIS — Z87438 Personal history of other diseases of male genital organs: Secondary | ICD-10-CM

## 2023-10-23 DIAGNOSIS — N138 Other obstructive and reflux uropathy: Secondary | ICD-10-CM

## 2023-10-23 DIAGNOSIS — N529 Male erectile dysfunction, unspecified: Secondary | ICD-10-CM | POA: Diagnosis not present

## 2023-10-26 ENCOUNTER — Encounter: Payer: Self-pay | Admitting: Urology

## 2023-10-27 ENCOUNTER — Ambulatory Visit: Payer: Self-pay | Admitting: Family Medicine

## 2023-10-27 DIAGNOSIS — R17 Unspecified jaundice: Secondary | ICD-10-CM

## 2023-10-27 DIAGNOSIS — R748 Abnormal levels of other serum enzymes: Secondary | ICD-10-CM

## 2023-10-27 DIAGNOSIS — R7989 Other specified abnormal findings of blood chemistry: Secondary | ICD-10-CM

## 2023-10-27 LAB — COMPREHENSIVE METABOLIC PANEL WITH GFR
ALT: 24 IU/L (ref 0–44)
AST: 34 IU/L (ref 0–40)
Albumin: 4.3 g/dL (ref 3.8–4.8)
Alkaline Phosphatase: 205 IU/L — ABNORMAL HIGH (ref 44–121)
BUN/Creatinine Ratio: 12 (ref 10–24)
BUN: 18 mg/dL (ref 8–27)
Bilirubin Total: 1.6 mg/dL — ABNORMAL HIGH (ref 0.0–1.2)
CO2: 21 mmol/L (ref 20–29)
Calcium: 11 mg/dL — ABNORMAL HIGH (ref 8.6–10.2)
Chloride: 96 mmol/L (ref 96–106)
Creatinine, Ser: 1.56 mg/dL — ABNORMAL HIGH (ref 0.76–1.27)
Globulin, Total: 2.5 g/dL (ref 1.5–4.5)
Glucose: 107 mg/dL — ABNORMAL HIGH (ref 70–99)
Potassium: 5.6 mmol/L — ABNORMAL HIGH (ref 3.5–5.2)
Sodium: 135 mmol/L (ref 134–144)
Total Protein: 6.8 g/dL (ref 6.0–8.5)
eGFR: 46 mL/min/{1.73_m2} — ABNORMAL LOW (ref >59)

## 2023-11-02 ENCOUNTER — Ambulatory Visit: Payer: Self-pay

## 2023-11-02 NOTE — Telephone Encounter (Signed)
 Patient requesting a phone call from PCP for recommendations. Patient endorsing BP reading of 90/60 (first reading of his BP in a while per patient)-patient states he is having continued symptoms of weakness, lightheadedness and pain with walking. Patient isn't wanted to go to the ED due to last visit. Patient is asking for recommendations from PCP and would like to speak to them over the phone.   Copied from CRM [id number]. Topic: Clinical - Red Word Triage >> Nov 02, 2023  1:05 PM Delon DASEN wrote: Red Word that prompted transfer to Nurse Triage: BP 90/60, weak, lightheaded, can hardly walk Reason for Disposition  [1] MODERATE weakness or fatigue AND [2] from poor fluid intake AND [3] no improvement after 2 hours of rest and fluids  Answer Assessment - Initial Assessment Questions 1. DESCRIPTION: Describe how you are feeling.     Weakness, lightheadedness, difficulty moving around due to joint pain 2. SEVERITY: How bad is it?  Can you stand and walk?   - MILD (0-3): Feels weak or tired, but does not interfere with work, school or normal activities.   - MODERATE (4-7): Able to stand and walk; weakness interferes with work, school, or normal activities.   - SEVERE (8-10): Unable to stand or walk; unable to do usual activities.     Moderate to severe 3. ONSET: When did these symptoms begin? (e.g., hours, days, weeks, months)     Several months ago 4. CAUSE: What do you think is causing the weakness or fatigue? (e.g., not drinking enough fluids, medical problem, trouble sleeping)     unsure 5. NEW MEDICINES:  Have you started on any new medicines recently? (e.g., opioid pain medicines, benzodiazepines, muscle relaxants, antidepressants, antihistamines, neuroleptics, beta blockers)     Started on crestor  6. OTHER SYMPTOMS: Do you have any other symptoms? (e.g., chest pain, fever, cough, SOB, vomiting, diarrhea, bleeding, other areas of pain)     BP reading of 90/60-patient states this  was the first reading he has had. Patient is requesting for a phone call back from PCP.  Protocols used: Weakness (Generalized) and Fatigue-A-AH

## 2023-11-03 NOTE — Telephone Encounter (Signed)
 Pt's PCP out of office. Recommend pt be seen in ED, BP is lower than baseline per chart review, and he is symptomatic, he may need IV fluids. This is not something that can be assessed through phone or MyChart.

## 2023-11-04 ENCOUNTER — Ambulatory Visit
Admission: RE | Admit: 2023-11-04 | Discharge: 2023-11-04 | Disposition: A | Discharge status: Home / Self Care | Source: Ambulatory Visit | Attending: Family Medicine | Admitting: Family Medicine

## 2023-11-04 DIAGNOSIS — R17 Unspecified jaundice: Secondary | ICD-10-CM | POA: Diagnosis present

## 2023-11-04 DIAGNOSIS — R748 Abnormal levels of other serum enzymes: Secondary | ICD-10-CM | POA: Insufficient documentation

## 2023-11-05 LAB — COMPREHENSIVE METABOLIC PANEL WITH GFR
ALT: 41 IU/L (ref 0–44)
AST: 40 IU/L (ref 0–40)
Albumin: 4.1 g/dL (ref 3.8–4.8)
Alkaline Phosphatase: 223 IU/L — ABNORMAL HIGH (ref 44–121)
BUN/Creatinine Ratio: 11 (ref 10–24)
BUN: 18 mg/dL (ref 8–27)
Bilirubin Total: 1.8 mg/dL — ABNORMAL HIGH (ref 0.0–1.2)
CO2: 19 mmol/L — ABNORMAL LOW (ref 20–29)
Calcium: 11.5 mg/dL — ABNORMAL HIGH (ref 8.6–10.2)
Chloride: 94 mmol/L — ABNORMAL LOW (ref 96–106)
Creatinine, Ser: 1.68 mg/dL — ABNORMAL HIGH (ref 0.76–1.27)
Globulin, Total: 3.1 g/dL (ref 1.5–4.5)
Glucose: 111 mg/dL — ABNORMAL HIGH (ref 70–99)
Potassium: 5.7 mmol/L — ABNORMAL HIGH (ref 3.5–5.2)
Sodium: 132 mmol/L — ABNORMAL LOW (ref 134–144)
Total Protein: 7.2 g/dL (ref 6.0–8.5)
eGFR: 42 mL/min/1.73 — ABNORMAL LOW (ref >59)

## 2023-11-05 NOTE — Telephone Encounter (Signed)
 Called patient to inform him of what the provider stated, he was a little bit confused.  He came in on 11/04/2023 to receive blood work and got an ultrasound done yesterday.  I explained to patient as to what the call was pertaining to and he verbalized understanding.  He stated he wasn't worried about the phone call he made into the office on 11/02/2023 now he is just waiting on lab and ultrasound results.

## 2023-11-06 ENCOUNTER — Ambulatory Visit: Payer: Self-pay

## 2023-11-06 NOTE — Telephone Encounter (Signed)
 FYI Only or Action Required?: FYI only for provider.  Patient was last seen in primary care on 10/08/2023 by Myrla Jon HERO, MD.  Called Nurse Triage reporting Back Pain.  Symptoms began several weeks ago.  Interventions attempted: OTC medications: tylenol  and Rest, hydration, or home remedies.  Symptoms are: unchanged.  Triage Disposition: See PCP Within 2 Weeks, See PCP When Office is Open (Within 3 Days)  Patient/caregiver understands and will follow disposition?: Yes        Copied from CRM [id number]. Topic: Clinical - Red Word Triage >> Nov 06, 2023 11:03 AM Gustabo D wrote: pt wants to see his pcp in person tyo discuss back pain and other pain he's having. Reason for Disposition  [1] MILD dizziness (e.g., walking normally) AND [2] has NOT been evaluated by doctor (or NP/PA) for this  (Exception: Dizziness caused by heat exposure, sudden standing, or poor fluid intake.)  Back pain is a chronic symptom (recurrent or ongoing AND present > 4 weeks)  Answer Assessment - Initial Assessment Questions 1. ONSET: When did the pain begin? (e.g., minutes, hours, days)     7 weeks ago 2. LOCATION: Where does it hurt? (upper, mid or lower back)     Mid back 3. SEVERITY: How bad is the pain?  (e.g., Scale 1-10; mild, moderate, or severe)     7/10 4. PATTERN: Is the pain constant? (e.g., yes, no; constant, intermittent)      Come and goes  5. RADIATION: Does the pain shoot into your legs or somewhere else?     Hips and tailbone 6. CAUSE:  What do you think is causing the back pain?      unknown 7. BACK OVERUSE:  Any recent lifting of heavy objects, strenuous work or exercise?     no 8. MEDICINES: What have you taken so far for the pain? (e.g., nothing, acetaminophen , NSAIDS)     tylenol  9. NEUROLOGIC SYMPTOMS: Do you have any weakness, numbness, or problems with bowel/bladder control?     no 10. OTHER SYMPTOMS: Do you have any other symptoms? (e.g.,  fever, abdomen pain, burning with urination, blood in urine)       fatigue  Answer Assessment - Initial Assessment Questions 1. DESCRIPTION: Describe your dizziness.     Like he will loose his balance 2. LIGHTHEADED: Do you feel lightheaded? (e.g., somewhat faint, woozy, weak upon standing)     Off balance 3. VERTIGO: Do you feel like either you or the room is spinning or tilting? (i.e., vertigo)     no 4. SEVERITY: How bad is it?  Do you feel like you are going to faint? Can you stand and walk?     no 5. ONSET:  When did the dizziness begin?     Lightheaded for several weeks 6. AGGRAVATING FACTORS: Does anything make it worse? (e.g., standing, change in head position)     standing 7. HEART RATE: Can you tell me your heart rate? How many beats in 15 seconds?  (Note: Not all patients can do this.)       unknown 8. CAUSE: What do you think is causing the dizziness? (e.g., decreased fluids or food, diarrhea, emotional distress, heat exposure, new medicine, sudden standing, vomiting; unknown)     Was taking olive oil but stopped a week ago was causing low BP 9. RECURRENT SYMPTOM: Have you had dizziness before? If Yes, ask: When was the last time? What happened that time?     Yes a week  ago 10. OTHER SYMPTOMS: Do you have any other symptoms? (e.g., fever, chest pain, vomiting, diarrhea, bleeding)       Back pain and fatigue  Protocols used: Back Pain-A-AH, Dizziness - Lightheadedness-A-AH

## 2023-11-11 ENCOUNTER — Encounter: Payer: Self-pay | Admitting: Family Medicine

## 2023-11-11 ENCOUNTER — Ambulatory Visit (INDEPENDENT_AMBULATORY_CARE_PROVIDER_SITE_OTHER): Admitting: Family Medicine

## 2023-11-11 VITALS — BP 104/65 | HR 115 | Resp 18 | Ht 72.0 in | Wt 209.3 lb

## 2023-11-11 DIAGNOSIS — E875 Hyperkalemia: Secondary | ICD-10-CM

## 2023-11-11 DIAGNOSIS — I10 Essential (primary) hypertension: Secondary | ICD-10-CM

## 2023-11-11 DIAGNOSIS — E871 Hypo-osmolality and hyponatremia: Secondary | ICD-10-CM | POA: Diagnosis not present

## 2023-11-11 DIAGNOSIS — M545 Low back pain, unspecified: Secondary | ICD-10-CM

## 2023-11-11 MED ORDER — CYCLOBENZAPRINE HCL 10 MG PO TABS
10.0000 mg | ORAL_TABLET | Freq: Three times a day (TID) | ORAL | 2 refills | Status: AC | PRN
Start: 2023-11-11 — End: ?

## 2023-11-11 MED ORDER — LISINOPRIL 40 MG PO TABS: 20.0000 mg | ORAL_TABLET | Freq: Every day | ORAL | Status: DC

## 2023-11-11 NOTE — Progress Notes (Signed)
 Established patient visit   Patient: Ruben Garcia   DOB: 1946-04-12   78 y.o. Male  MRN: [id number] Visit Date: 11/11/2023  Today's healthcare provider: Nancyann Perry, MD   Chief Complaint  Patient presents with   Back Pain    X 2 months. Patient went to the [facility] emergency room on Monday. Patient was given a muscle relaxer.   Subjective    HPI Discussed the use of AI scribe software for clinical note transcription with the patient, who gave verbal consent to proceed.  History of Present Illness   Ruben Garcia is a 78 year old male who presents with severe back and joint pain.  He has been experiencing severe back pain for the past two months, which intensified about a month ago. Initially, he had a dry cough and was treated at a walk-in clinic with antibiotics for a presumed infection, but these treatments were ineffective. He was later referred to the emergency room where scans were performed, revealing elevated liver and kidney counts, but no further action was taken. The pain became so severe that he was unable to find a comfortable position and had difficulty sleeping.  He visited the [facility] emergency room where an x-ray of his back was performed, and he was told he had severe arthritis and bone spurs. He was prescribed cyclobenzaprine , which significantly reduced his back pain from a level ten to a one or two. He takes the medication as needed rather than daily.  He continues to experience joint pain, particularly in his hip, although it is starting to fade. He also reports pain in the back of his neck and down his spine, which has lightened up recently. He has not seen an orthopedist for his joint pain.  He has a history of low blood pressure and reports feeling lightheaded and dizzy. He takes lisinopril  for blood pressure management, but his blood pressure has been low for some time. Recent blood work showed high potassium and low sodium levels. He also takes vitamin B12, a  daily multivitamin, and Crestor  for cholesterol management.  He reports a persistent hacking cough. No use of meloxicam , as he does not recall having it. He experiences constant dizziness and lightheadedness.     Lab Results  Component Value Date   NA 132 (L) 11/04/2023   K 5.7 (H) 11/04/2023   CREATININE 1.68 (H) 11/04/2023   EGFR 42 (L) 11/04/2023   GLUCOSE 111 (H) 11/04/2023   Lab Results  Component Value Date   WBC 11.4 (H) 10/16/2023   HGB 12.6 (L) 10/16/2023   HCT 37.9 (L) 10/16/2023   MCV 94.3 10/16/2023   PLT 344 10/16/2023     Medications: Outpatient Medications Prior to Visit  Medication Sig   albuterol  (VENTOLIN  HFA) 108 (90 Base) MCG/ACT inhaler Inhale 1-2 puffs into the lungs every 6 (six) hours as needed.   aspirin EC 81 MG tablet Take 81 mg by mouth daily.   Cyanocobalamin  (VITAMIN B 12) 500 MCG TABS Take 100 mg by mouth daily at 6 (six) AM.   cyclobenzaprine  (FLEXERIL ) 10 MG tablet Take 10 mg by mouth.   fluticasone  (FLONASE ) 50 MCG/ACT nasal spray Place 2 sprays into both nostrils daily.   Garlic 10 MG CAPS Take 10 mg by mouth daily.    hydrocortisone 2.5 % cream SMARTSIG:1 Topical Daily   ibuprofen  (ADVIL ,MOTRIN ) 200 MG tablet Take 400 mg by mouth every 8 (eight) hours as needed (for pain.).   lidocaine  (LIDODERM )  5 % Place 1 patch onto the skin every 12 (twelve) hours. Remove & Discard patch within 12 hours or as directed by MD   lisinopril  (ZESTRIL ) 40 MG tablet Take 1 tablet by mouth once daily   Multiple Vitamins-Minerals (MULTIVITAMIN PO) Take by mouth daily.   nystatin -triamcinolone  ointment (MYCOLOG) Apply 1 Application topically 2 (two) times daily.   Omega-3 Fatty Acids (FISH OIL CONCENTRATE) 1000 MG CAPS Take 1,000 mg by mouth daily.    rosuvastatin  (CRESTOR ) 5 MG tablet Take 1 tablet (5 mg total) by mouth daily.   sildenafil  (VIAGRA ) 100 MG tablet Take 1 tablet (100 mg total) by mouth daily as needed for erectile dysfunction. Take two hours prior to  intercourse on an empty stomach   terbinafine  (LAMISIL ) 250 MG tablet Take 1 tablet (250 mg total) by mouth daily.   triamcinolone  cream (KENALOG ) 0.1 % Apply 1 application topically 2 (two) times daily.   vitamin E 400 UNIT capsule Take 400 Units by mouth daily.   meloxicam  (MOBIC ) 15 MG tablet Take 15 mg by mouth daily. (Patient not taking: Reported on 11/11/2023)   simvastatin  (ZOCOR ) 20 MG tablet TAKE 1 TABLET BY MOUTH AT BEDTIME (Patient not taking: Reported on 11/11/2023)   No facility-administered medications prior to visit.      Objective    BP 104/65 (BP Location: Left Arm, Patient Position: Sitting, Cuff Size: Normal)   Pulse (!) 115   Resp 18   Ht 6' (1.829 m)   Wt 209 lb 4.8 oz (94.9 kg)   SpO2 99%   BMI 28.39 kg/m    Physical Exam   General appearance: Well developed, well nourished male, cooperative and in no acute distress Head: Normocephalic, without obvious abnormality, atraumatic Respiratory: Respirations even and unlabored, normal respiratory rate Extremities: All extremities are intact.  Skin: Skin color, texture, turgor normal. No rashes seen  Psych: Appropriate mood and affect. Neurologic: Mental status: Alert, oriented to person, place, and time, thought content appropriate.    Assessment & Plan     1. Acute low back pain without sciatica, unspecified back pain laterality (Primary) He reports this is much better since starting cyclobenzaprine  at [facility] ER and requests refill.  - cyclobenzaprine  (FLEXERIL ) 10 MG tablet; Take 1 tablet (10 mg total) by mouth 3 (three) times daily as needed for muscle spasms.  Dispense: 30 tablet; Refill: 2  2. Hyperkalemia   3. Hyponatremia   4. Essential (primary) hypertension He reports low home blood pressure and feels dizzy every time he gets up.   Reduce lisinopril  (ZESTRIL ) 40 MG tablet; To 0.5 tablets (20 mg total) by mouth daily.   Future Appointments  Date Time Provider Department Center  12/24/2023  9:00 AM  [name], Jon HERO, MD [facility]           Nancyann Perry, MD  [facility] [phone] (phone) [phone] (fax)  [facility]

## 2023-11-11 NOTE — Patient Instructions (Signed)
 [name] Kitchen  Please review the attached list of medications and notify my office if there are any errors.   . Please bring all of your medications to every appointment so we can make sure that our medication list is the same as yours.

## 2023-11-12 ENCOUNTER — Telehealth: Payer: Self-pay

## 2023-11-12 DIAGNOSIS — R7989 Other specified abnormal findings of blood chemistry: Secondary | ICD-10-CM

## 2023-11-12 NOTE — Telephone Encounter (Signed)
 Pt came into clinic to have labs drawn per DR B

## 2023-11-13 ENCOUNTER — Ambulatory Visit: Payer: Self-pay

## 2023-11-13 ENCOUNTER — Ambulatory Visit: Payer: Self-pay | Admitting: Family Medicine

## 2023-11-13 LAB — BMP8+EGFR
BUN/Creatinine Ratio: 11 (ref 10–24)
BUN: 20 mg/dL (ref 8–27)
CO2: 19 mmol/L — ABNORMAL LOW (ref 20–29)
Calcium: 11.3 mg/dL — ABNORMAL HIGH (ref 8.6–10.2)
Chloride: 96 mmol/L (ref 96–106)
Creatinine, Ser: 1.84 mg/dL — ABNORMAL HIGH (ref 0.76–1.27)
Glucose: 112 mg/dL — ABNORMAL HIGH (ref 70–99)
Potassium: 5 mmol/L (ref 3.5–5.2)
Sodium: 135 mmol/L (ref 134–144)
eGFR: 38 mL/min/1.73 — ABNORMAL LOW (ref >59)

## 2023-11-13 NOTE — Telephone Encounter (Signed)
 FYI Only or Action Required?: Action required by provider: clinical question for provider and update on patient condition.  Patient was last seen in primary care on 11/11/2023 by Gasper Nancyann BRAVO, MD.  Called Nurse Triage reporting Hypotension.  Symptoms began several weeks ago.  Interventions attempted: Prescription medications: Lisinopril .  Symptoms are: gradually worsening.  Triage Disposition: See HCP Within 4 Hours (Or PCP Triage)- No appt avail, RN advising ED  Patient/caregiver understands and will follow disposition?: No. Patient says he will continue to monitor BP at home before deciding to go to ED. This RN did advises patient to hold BP medications until symptoms improves and BP normalizes. Pt also requesting parameters for when to hold/take medication.      Copied from CRM [id number]. Topic: Clinical - Red Word Triage >> Nov 13, 2023  4:31 PM [place] B wrote: [facility] that prompted transfer to Nurse Triage: pt has blood pressure 90/60 Reason for Disposition  [1] Systolic BP 90-110 AND [2] taking blood pressure medications AND [3] feeling weak or lightheaded  Answer Assessment - Initial Assessment Questions 1. BLOOD PRESSURE: What is your blood pressure? Did you take at least two measurements 5 minutes apart?     90/60 the other day, 98/68 while on phone with NT  2. ONSET: When did you take your blood pressure?     Has been low for a few weeks  3. HOW: How did you take your blood pressure? (e.g., visiting nurse, automatic home BP monitor)     Home BP Monitor  4. HISTORY: Do you have a history of low blood pressure? What is your blood pressure normally?     Has history of high blood pressure  5. MEDICINES: Are you taking any medicines for blood pressure? If Yes, ask: Have they been changed recently?     Taking Lisinopril  20 mg (was advised on 7/16 to cut Lisinopril  40mg  in half)  6. PULSE RATE: Do you know what your pulse rate is?      107  7.  OTHER SYMPTOMS: Have you been sick recently? Have you had a recent injury?     Feeling weak, dizzy and faint. Has been having a hacking cough for 2 months.  Protocols used: Blood Pressure - Low-A-AH

## 2023-11-16 NOTE — Telephone Encounter (Signed)
 Sure 6 wk is fine

## 2023-11-16 NOTE — Telephone Encounter (Signed)
 Advised of your message- patient will keep 6wk follow up visit. Also wanted you to be aware that he has moved up the procedure for his kidney to tomorrow- patient was not sure of the name of procedure but wanted you to be aware.

## 2023-11-16 NOTE — Telephone Encounter (Signed)
 Dr Gasper halved lisinopril  dose at recent visit. If patient still experiencing hypotension, do not take any lisinopril . Make follow up visit in 2 weeks if not already scheduled

## 2023-11-18 ENCOUNTER — Other Ambulatory Visit: Payer: Self-pay | Admitting: Nephrology

## 2023-11-18 ENCOUNTER — Ambulatory Visit: Admission: RE | Admit: 2023-11-18 | Source: Ambulatory Visit

## 2023-11-18 DIAGNOSIS — R053 Chronic cough: Secondary | ICD-10-CM

## 2023-11-19 ENCOUNTER — Encounter: Payer: Self-pay | Admitting: Internal Medicine

## 2023-11-19 ENCOUNTER — Inpatient Hospital Stay
Admission: EM | Admit: 2023-11-19 | Discharge: 2023-11-21 | DRG: 181 | Disposition: A | Discharge status: Home / Self Care | Source: Ambulatory Visit | Attending: Internal Medicine | Admitting: Internal Medicine

## 2023-11-19 ENCOUNTER — Other Ambulatory Visit: Payer: Self-pay

## 2023-11-19 ENCOUNTER — Ambulatory Visit: Admission: RE | Admit: 2023-11-19 | Source: Ambulatory Visit

## 2023-11-19 ENCOUNTER — Emergency Department

## 2023-11-19 ENCOUNTER — Inpatient Hospital Stay

## 2023-11-19 DIAGNOSIS — D508 Other iron deficiency anemias: Secondary | ICD-10-CM | POA: Diagnosis present

## 2023-11-19 DIAGNOSIS — E7800 Pure hypercholesterolemia, unspecified: Secondary | ICD-10-CM | POA: Diagnosis present

## 2023-11-19 DIAGNOSIS — R64 Cachexia: Secondary | ICD-10-CM | POA: Diagnosis present

## 2023-11-19 DIAGNOSIS — Z860100 Personal history of colon polyps, unspecified: Secondary | ICD-10-CM | POA: Diagnosis not present

## 2023-11-19 DIAGNOSIS — D638 Anemia in other chronic diseases classified elsewhere: Secondary | ICD-10-CM | POA: Insufficient documentation

## 2023-11-19 DIAGNOSIS — Z7982 Long term (current) use of aspirin: Secondary | ICD-10-CM | POA: Diagnosis not present

## 2023-11-19 DIAGNOSIS — R110 Nausea: Secondary | ICD-10-CM | POA: Insufficient documentation

## 2023-11-19 DIAGNOSIS — M5442 Lumbago with sciatica, left side: Secondary | ICD-10-CM | POA: Diagnosis present

## 2023-11-19 DIAGNOSIS — G893 Neoplasm related pain (acute) (chronic): Secondary | ICD-10-CM | POA: Diagnosis present

## 2023-11-19 DIAGNOSIS — R7989 Other specified abnormal findings of blood chemistry: Secondary | ICD-10-CM | POA: Insufficient documentation

## 2023-11-19 DIAGNOSIS — Z8261 Family history of arthritis: Secondary | ICD-10-CM

## 2023-11-19 DIAGNOSIS — Z85828 Personal history of other malignant neoplasm of skin: Secondary | ICD-10-CM

## 2023-11-19 DIAGNOSIS — E663 Overweight: Secondary | ICD-10-CM | POA: Diagnosis present

## 2023-11-19 DIAGNOSIS — R54 Age-related physical debility: Secondary | ICD-10-CM | POA: Diagnosis present

## 2023-11-19 DIAGNOSIS — R531 Weakness: Secondary | ICD-10-CM | POA: Diagnosis present

## 2023-11-19 DIAGNOSIS — N179 Acute kidney failure, unspecified: Secondary | ICD-10-CM | POA: Diagnosis present

## 2023-11-19 DIAGNOSIS — D631 Anemia in chronic kidney disease: Secondary | ICD-10-CM | POA: Diagnosis present

## 2023-11-19 DIAGNOSIS — R52 Pain, unspecified: Secondary | ICD-10-CM | POA: Diagnosis not present

## 2023-11-19 DIAGNOSIS — I2510 Atherosclerotic heart disease of native coronary artery without angina pectoris: Secondary | ICD-10-CM | POA: Diagnosis present

## 2023-11-19 DIAGNOSIS — M5441 Lumbago with sciatica, right side: Secondary | ICD-10-CM | POA: Diagnosis present

## 2023-11-19 DIAGNOSIS — C3401 Malignant neoplasm of right main bronchus: Secondary | ICD-10-CM | POA: Insufficient documentation

## 2023-11-19 DIAGNOSIS — Z801 Family history of malignant neoplasm of trachea, bronchus and lung: Secondary | ICD-10-CM

## 2023-11-19 DIAGNOSIS — Z833 Family history of diabetes mellitus: Secondary | ICD-10-CM | POA: Diagnosis not present

## 2023-11-19 DIAGNOSIS — R918 Other nonspecific abnormal finding of lung field: Secondary | ICD-10-CM

## 2023-11-19 DIAGNOSIS — Z79899 Other long term (current) drug therapy: Secondary | ICD-10-CM | POA: Diagnosis not present

## 2023-11-19 DIAGNOSIS — C3431 Malignant neoplasm of lower lobe, right bronchus or lung: Principal | ICD-10-CM | POA: Diagnosis present

## 2023-11-19 DIAGNOSIS — I10 Essential (primary) hypertension: Secondary | ICD-10-CM | POA: Diagnosis present

## 2023-11-19 DIAGNOSIS — Z8249 Family history of ischemic heart disease and other diseases of the circulatory system: Secondary | ICD-10-CM | POA: Diagnosis not present

## 2023-11-19 DIAGNOSIS — Z8349 Family history of other endocrine, nutritional and metabolic diseases: Secondary | ICD-10-CM

## 2023-11-19 DIAGNOSIS — I129 Hypertensive chronic kidney disease with stage 1 through stage 4 chronic kidney disease, or unspecified chronic kidney disease: Secondary | ICD-10-CM | POA: Diagnosis present

## 2023-11-19 DIAGNOSIS — Z885 Allergy status to narcotic agent status: Secondary | ICD-10-CM

## 2023-11-19 DIAGNOSIS — N401 Enlarged prostate with lower urinary tract symptoms: Secondary | ICD-10-CM | POA: Diagnosis present

## 2023-11-19 DIAGNOSIS — Z6828 Body mass index (BMI) 28.0-28.9, adult: Secondary | ICD-10-CM

## 2023-11-19 DIAGNOSIS — N1832 Chronic kidney disease, stage 3b: Secondary | ICD-10-CM | POA: Diagnosis present

## 2023-11-19 DIAGNOSIS — Z87891 Personal history of nicotine dependence: Secondary | ICD-10-CM

## 2023-11-19 DIAGNOSIS — Z791 Long term (current) use of non-steroidal anti-inflammatories (NSAID): Secondary | ICD-10-CM

## 2023-11-19 DIAGNOSIS — C7951 Secondary malignant neoplasm of bone: Secondary | ICD-10-CM | POA: Diagnosis present

## 2023-11-19 DIAGNOSIS — Z9049 Acquired absence of other specified parts of digestive tract: Secondary | ICD-10-CM

## 2023-11-19 LAB — URINALYSIS, ROUTINE W REFLEX MICROSCOPIC
Bilirubin Urine: NEGATIVE
Glucose, UA: NEGATIVE mg/dL
Hgb urine dipstick: NEGATIVE
Ketones, ur: 20 mg/dL — AB
Leukocytes,Ua: NEGATIVE
Nitrite: NEGATIVE
Protein, ur: NEGATIVE mg/dL
Specific Gravity, Urine: 1.016 (ref 1.005–1.030)
pH: 5 (ref 5.0–8.0)

## 2023-11-19 LAB — CK: Total CK: 23 U/L — ABNORMAL LOW (ref 49–397)

## 2023-11-19 LAB — COMPREHENSIVE METABOLIC PANEL WITH GFR
ALT: 29 U/L (ref 0–44)
AST: 39 U/L (ref 15–41)
Albumin: 3.6 g/dL (ref 3.5–5.0)
Alkaline Phosphatase: 173 U/L — ABNORMAL HIGH (ref 38–126)
Anion gap: 14 (ref 5–15)
BUN: 18 mg/dL (ref 8–23)
CO2: 22 mmol/L (ref 22–32)
Calcium: 10.8 mg/dL — ABNORMAL HIGH (ref 8.9–10.3)
Chloride: 97 mmol/L — ABNORMAL LOW (ref 98–111)
Creatinine, Ser: 1.46 mg/dL — ABNORMAL HIGH (ref 0.61–1.24)
GFR, Estimated: 50 mL/min — ABNORMAL LOW (ref >60)
Glucose, Bld: 114 mg/dL — ABNORMAL HIGH (ref 70–99)
Potassium: 4.1 mmol/L (ref 3.5–5.1)
Sodium: 133 mmol/L — ABNORMAL LOW (ref 135–145)
Total Bilirubin: 2.6 mg/dL — ABNORMAL HIGH (ref 0.0–1.2)
Total Protein: 7.3 g/dL (ref 6.5–8.1)

## 2023-11-19 LAB — CBC
HCT: 31.2 % — ABNORMAL LOW (ref 39.0–52.0)
Hemoglobin: 10.5 g/dL — ABNORMAL LOW (ref 13.0–17.0)
MCH: 32.3 pg (ref 26.0–34.0)
MCHC: 33.7 g/dL (ref 30.0–36.0)
MCV: 96 fL (ref 80.0–100.0)
Platelets: 410 K/uL — ABNORMAL HIGH (ref 150–400)
RBC: 3.25 MIL/uL — ABNORMAL LOW (ref 4.22–5.81)
RDW: 13.2 % (ref 11.5–15.5)
WBC: 8.5 K/uL (ref 4.0–10.5)
nRBC: 0 % (ref 0.0–0.2)

## 2023-11-19 LAB — TROPONIN I (HIGH SENSITIVITY)
Troponin I (High Sensitivity): 9 ng/L (ref <18)
Troponin I (High Sensitivity): 8 ng/L (ref <18)

## 2023-11-19 LAB — BILIRUBIN, FRACTIONATED(TOT/DIR/INDIR)
Bilirubin, Direct: 0.3 mg/dL — ABNORMAL HIGH (ref 0.0–0.2)
Indirect Bilirubin: 2.2 mg/dL — ABNORMAL HIGH (ref 0.3–0.9)
Total Bilirubin: 2.5 mg/dL — ABNORMAL HIGH (ref 0.0–1.2)

## 2023-11-19 MED ORDER — SODIUM CHLORIDE 0.9 % IV SOLN: 12.5000 mg | Freq: Four times a day (QID) | INTRAVENOUS | Status: AC | PRN

## 2023-11-19 MED ORDER — ONDANSETRON HCL 4 MG/2ML IJ SOLN
4.0000 mg | Freq: Once | INTRAMUSCULAR | Status: AC
  Administered 2023-11-19: 4 mg via INTRAVENOUS
  Filled 2023-11-19: qty 2

## 2023-11-19 MED ORDER — ACETAMINOPHEN 325 MG PO TABS: 650.0000 mg | ORAL_TABLET | Freq: Four times a day (QID) | ORAL | Status: DC | PRN

## 2023-11-19 MED ORDER — ALBUTEROL SULFATE (2.5 MG/3ML) 0.083% IN NEBU: 2.5000 mg | INHALATION_SOLUTION | Freq: Four times a day (QID) | RESPIRATORY_TRACT | Status: DC | PRN

## 2023-11-19 MED ORDER — MORPHINE SULFATE (PF) 4 MG/ML IV SOLN
4.0000 mg | Freq: Once | INTRAVENOUS | Status: AC
  Administered 2023-11-19: 4 mg via INTRAVENOUS
  Filled 2023-11-19: qty 1

## 2023-11-19 MED ORDER — ZOLEDRONIC ACID 4 MG/100ML IV SOLN: 4.0000 mg | Freq: Once | INTRAVENOUS | Status: DC

## 2023-11-19 MED ORDER — MORPHINE SULFATE (PF) 4 MG/ML IV SOLN: 4.0000 mg | INTRAVENOUS | Status: DC | PRN

## 2023-11-19 MED ORDER — LACTATED RINGERS IV SOLN: INTRAVENOUS | Status: DC

## 2023-11-19 MED ORDER — ACETAMINOPHEN 650 MG RE SUPP: 650.0000 mg | Freq: Four times a day (QID) | RECTAL | Status: DC | PRN

## 2023-11-19 MED ORDER — FENTANYL CITRATE PF 50 MCG/ML IJ SOSY: 25.0000 ug | PREFILLED_SYRINGE | INTRAMUSCULAR | Status: DC | PRN

## 2023-11-19 MED ORDER — DOCUSATE SODIUM 100 MG PO CAPS
100.0000 mg | ORAL_CAPSULE | Freq: Two times a day (BID) | ORAL | Status: DC
  Administered 2023-11-19 – 2023-11-21 (×4): 100 mg via ORAL
  Filled 2023-11-19 (×4): qty 1

## 2023-11-19 MED ORDER — ONDANSETRON HCL 4 MG/2ML IJ SOLN: 4.0000 mg | Freq: Four times a day (QID) | INTRAMUSCULAR | Status: DC | PRN

## 2023-11-19 MED ORDER — VITAMIN E 45 MG (100 UNIT) PO CAPS
400.0000 [IU] | ORAL_CAPSULE | Freq: Every day | ORAL | Status: DC
  Administered 2023-11-20 – 2023-11-21 (×2): 400 [IU] via ORAL
  Filled 2023-11-19 (×2): qty 4

## 2023-11-19 MED ORDER — MORPHINE SULFATE (PF) 4 MG/ML IV SOLN
4.0000 mg | INTRAVENOUS | Status: DC | PRN
  Administered 2023-11-19 – 2023-11-20 (×3): 4 mg via INTRAVENOUS
  Filled 2023-11-19 (×3): qty 1

## 2023-11-19 MED ORDER — ROSUVASTATIN CALCIUM 10 MG PO TABS
5.0000 mg | ORAL_TABLET | Freq: Every day | ORAL | Status: DC
  Administered 2023-11-19 – 2023-11-20 (×2): 5 mg via ORAL
  Filled 2023-11-19 (×3): qty 1

## 2023-11-19 MED ORDER — GADOBUTROL 1 MMOL/ML IV SOLN
9.0000 mL | Freq: Once | INTRAVENOUS | Status: AC | PRN
  Administered 2023-11-19: 9 mL via INTRAVENOUS

## 2023-11-19 MED ORDER — CYCLOBENZAPRINE HCL 10 MG PO TABS: 10.0000 mg | ORAL_TABLET | Freq: Three times a day (TID) | ORAL | Status: DC | PRN

## 2023-11-19 MED ORDER — ZOLEDRONIC ACID 4 MG/5ML IV CONC: 4.0000 mg | Freq: Once | INTRAVENOUS | Status: DC

## 2023-11-19 MED ORDER — FLUTICASONE PROPIONATE 50 MCG/ACT NA SUSP
2.0000 | Freq: Every day | NASAL | Status: DC
  Administered 2023-11-20: 2 via NASAL
  Filled 2023-11-19: qty 16

## 2023-11-19 MED ORDER — HEPARIN SODIUM (PORCINE) 5000 UNIT/ML IJ SOLN
5000.0000 [IU] | Freq: Three times a day (TID) | INTRAMUSCULAR | Status: DC
  Administered 2023-11-19 – 2023-11-21 (×5): 5000 [IU] via SUBCUTANEOUS
  Filled 2023-11-19 (×5): qty 1

## 2023-11-19 MED ORDER — LISINOPRIL 20 MG PO TABS
20.0000 mg | ORAL_TABLET | Freq: Every day | ORAL | Status: DC
  Administered 2023-11-20 – 2023-11-21 (×2): 20 mg via ORAL
  Filled 2023-11-19 (×2): qty 1

## 2023-11-19 MED ORDER — OXYCODONE HCL 5 MG PO TABS
5.0000 mg | ORAL_TABLET | Freq: Four times a day (QID) | ORAL | Status: AC | PRN
  Administered 2023-11-20: 5 mg via ORAL
  Filled 2023-11-19: qty 1

## 2023-11-19 MED ORDER — ZOLEDRONIC ACID 4 MG/100ML IV SOLN
4.0000 mg | Freq: Once | INTRAVENOUS | Status: AC
  Administered 2023-11-19: 4 mg via INTRAVENOUS
  Filled 2023-11-19: qty 100

## 2023-11-19 MED ORDER — SODIUM CHLORIDE 0.9 % IV BOLUS
500.0000 mL | Freq: Once | INTRAVENOUS | Status: AC
  Administered 2023-11-19: 500 mL via INTRAVENOUS

## 2023-11-19 MED ORDER — CYANOCOBALAMIN 500 MCG PO TABS
1000.0000 ug | ORAL_TABLET | Freq: Every day | ORAL | Status: DC
  Administered 2023-11-20 – 2023-11-21 (×2): 1000 ug via ORAL
  Filled 2023-11-19 (×2): qty 2

## 2023-11-19 MED ORDER — ONDANSETRON HCL 4 MG PO TABS: 4.0000 mg | ORAL_TABLET | Freq: Four times a day (QID) | ORAL | Status: DC | PRN

## 2023-11-19 NOTE — Assessment & Plan Note (Deleted)
 EDP has consulted medical oncology, Dr. Rennie who states patient will be seen Medical oncology consulted medical pulmonologist, Dr. Isadora is aware

## 2023-11-19 NOTE — ED Provider Notes (Signed)
 [facility] Provider Note    Event Date/Time   First MD Initiated Contact with Patient 11/19/23 1029     (approximate)   History   Weakness   HPI  Ruben Garcia is a 78 y.o. male history of basal cell carcinoma, hypertension, BPH, kidney disease presents emergency department complaining of weakness for about 2 to 3 months.  States getting worse.  Was seen by Dr. Lazarus.  Dr. Lazarus ordered a CT chest without contrast as he is concerned about lung cancer.  Patient states he was too weak to go and get the CT and came to the ED instead.  States having severe spinal pain.  Did have x-rays of his spine 2 weeks ago which was normal.  Denies numbness or tingling.  States has had a cough and wheezing for several months.  Patient is non-smoker.  Has never smoked unsliced.  Was exposed secondhand smoke as a child.  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      Physical Exam   Triage Vital Signs: ED Triage Vitals  Encounter Vitals Group     BP 11/19/23 0952 126/81     Girls Systolic BP Percentile --      Girls Diastolic BP Percentile --      Boys Systolic BP Percentile --      Boys Diastolic BP Percentile --      Pulse Rate 11/19/23 0952 (!) 125     Resp 11/19/23 0952 18     Temp 11/19/23 0952 98.8 F (37.1 C)     Temp Source 11/19/23 0952 Oral     SpO2 11/19/23 0952 99 %     Weight 11/19/23 0953 209 lb (94.8 kg)     Height 11/19/23 0953 6' (1.829 m)     Head Circumference --      Peak Flow --      Pain Score 11/19/23 0953 10     Pain Loc --      Pain Education --      Exclude from Growth Chart --     Most recent vital signs: Vitals:   11/19/23 1241 11/19/23 1430  BP: 124/78 121/77  Pulse: 98 (!) 110  Resp: 17 16  Temp: 98.3 F (36.8 C)   SpO2: 95% (!) 88%     General: Awake, no distress.   CV:  Good peripheral perfusion. regular rate and  rhythm Resp:  Normal effort. Lungs with wheezing  bilaterally Abd:  No distention.  Nontender Other:  Rectal exam shows some stool in the vault and hemorrhoids, Hemoccult negative   ED Results / Procedures / Treatments   Labs (all labs ordered are listed, but only abnormal results are displayed) Labs Reviewed  CBC - Abnormal; Notable for the following components:      Result Value   RBC 3.25 (*)    Hemoglobin 10.5 (*)    HCT 31.2 (*)    Platelets 410 (*)    All other components within normal limits  COMPREHENSIVE METABOLIC PANEL WITH GFR - Abnormal; Notable for the following components:   Sodium 133 (*)    Chloride 97 (*)    Glucose, Bld 114 (*)    Creatinine, Ser 1.46 (*)    Calcium  10.8 (*)    Alkaline Phosphatase 173 (*)    Total Bilirubin 2.6 (*)    GFR, Estimated 50 (*)    All other components within normal  limits  CK - Abnormal; Notable for the following components:   Total CK 23 (*)    All other components within normal limits  URINALYSIS, ROUTINE W REFLEX MICROSCOPIC  PARATHYROID HORMONE, INTACT (NO CA)  TROPONIN I (HIGH SENSITIVITY)  TROPONIN I (HIGH SENSITIVITY)     EKG  EKG   RADIOLOGY Chest x-ray, CT chest without contrast    PROCEDURES:   Procedures  Critical Care: No Chief Complaint  Patient presents with   Weakness      MEDICATIONS ORDERED IN ED: Medications  oxyCODONE  (Oxy IR/ROXICODONE ) immediate release tablet 5 mg (has no administration in time range)  fentaNYL  (SUBLIMAZE ) injection 25 mcg (has no administration in time range)  acetaminophen  (TYLENOL ) tablet 650 mg (has no administration in time range)    Or  acetaminophen  (TYLENOL ) suppository 650 mg (has no administration in time range)  ondansetron  (ZOFRAN ) tablet 4 mg (has no administration in time range)    Or  ondansetron  (ZOFRAN ) injection 4 mg (has no administration in time range)  heparin  injection 5,000 Units (has no administration in time range)  docusate sodium  (COLACE) capsule 100 mg (has no administration in time  range)  lactated ringers  infusion ( Intravenous New Bag/Given 11/19/23 1444)  lisinopril  (ZESTRIL ) tablet 20 mg (has no administration in time range)  rosuvastatin  (CRESTOR ) tablet 5 mg (has no administration in time range)  Vitamin B 12 TABS 100 mg (has no administration in time range)  cyclobenzaprine  (FLEXERIL ) tablet 10 mg (has no administration in time range)  vitamin E  capsule 400 Units (has no administration in time range)  fluticasone  (FLONASE ) 50 MCG/ACT nasal spray 2 spray (has no administration in time range)  albuterol  (PROVENTIL ) (2.5 MG/3ML) 0.083% nebulizer solution 2.5 mg (has no administration in time range)  promethazine (PHENERGAN) 12.5 mg in sodium chloride  0.9 % 50 mL IVPB (has no administration in time range)  morphine  (PF) 4 MG/ML injection 4 mg (has no administration in time range)  sodium chloride  0.9 % bolus 500 mL (0 mLs Intravenous Stopped 11/19/23 1412)  morphine  (PF) 4 MG/ML injection 4 mg (4 mg Intravenous Given 11/19/23 1339)  ondansetron  (ZOFRAN ) injection 4 mg (4 mg Intravenous Given 11/19/23 1338)     IMPRESSION / MDM / ASSESSMENT AND PLAN / ED COURSE  I reviewed the triage vital signs and the nursing notes.                              Differential diagnosis includes, but is not limited to, MI, anemia, GI bleed, lung cancer, hyperparathyroid, hypothyroid, kidney failure  Patient's presentation is most consistent with acute illness / injury with system symptoms.    Medications given: Normal saline 500 mL IV  Patient's labs are concerning for low hemoglobin as 1 month ago he was 12.5 and today he is 10.5.  Most likely anemia of chronic disease.  Troponin and CK reassuring, comprehensive metabolic panel shows concerns for kidney disease, increased calcium , high alk phosphatase, high bilirubin and decreased GFR.  Therefore there are concerns for lung cancer due to the lung issues he is having along with back pain.  Dr. Lazarus had ordered a CT of the chest for  today.  So I did do this through the ED.  Will also assess his parathyroid gland due to the increased calcium  noted.   Chest CT, did independently review and interpret radiologist reading as being positive for lung cancer.  I did explain all the  findings to the patient.  Explained to him they would do further workup on the floor.  He was given pain medication, morphine  4 mg IV, normal saline 500 mL IV, Zofran  4 mg IV.  I did consult oncology, Dr. Rennie recommends due to his symptoms that we admit him to the hospital for further workup.  Consult hospitalist, gave report to Dr. Sherre.  Will be admitting the patient.  The patient and his wife are in agreement with this treatment plan.  He was admitted in stable condition.   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    Gasper Devere ORN, PA-C 11/19/23 1529    Viviann Pastor, MD 11/20/23 [phone]

## 2023-11-19 NOTE — H&P (Addendum)
 History and Physical   Ruben Garcia FMW:[id number] DOB: [birth year] DOA: 11/19/2023  PCP: Myrla Jon HERO, MD  Outpatient Specialists: Dr. Marcelino, neurology Patient coming from: Home  I have personally briefly reviewed patient's old medical records in [facility] EMR.  Chief Concern: Weakness, weight loss  HPI: Ruben Garcia is a 78 year old male with history of hypertension, CKD stage IIIa, basal cell carcinoma, who presents ED for chief concerns of generalized weakness and allover pain for weeks.  Vitals in the ED showed T of 98.3, respiration of 18, heart rate 125, blood pressure 126/81, SpO2 99% on room air.  Serum sodium is 133, potassium 4.1, chloride 97, bicarb 22, BUN 18, serum creatinine 1.46, EGFR 50, nonfasting blood glucose 114, WBC 8.5, hemoglobin 10.5, platelets of 410.  CK is 23.  HS troponin is 9.  ED treatment: Morphine  4 mg IV one-time dose, ondansetron  4 mg IV one-time dose, sodium chloride  500 mL liter bolus. ------------------------------------ At bedside, patient able to tell me his first and last name, age, location, current calendar year.  He reports that he has been feeling weak and fatigued for the last 2 months.  He also endorses weight loss thought it was intentional because he was trying to lose weight.  Denies trauma to his person, chest pain, shortness of breath, dysuria, hematuria, swelling of his lower extremities.  Social history: He lives at home with his wife.  He denies tobacco, EtOH, recreational drug use.  He was exposed to secondhand smoking via his dad in childhood.  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  ROS: Constitutional: + weight change, no fever ENT/Mouth: no sore throat, no rhinorrhea Eyes: no eye pain, no vision changes Cardiovascular: no chest pain, no dyspnea,  no edema, no palpitations Respiratory: no cough, no sputum, no wheezing Gastrointestinal: no nausea, no vomiting, no diarrhea, no  constipation Genitourinary: no urinary incontinence, no dysuria, no hematuria Musculoskeletal: + arthralgias, + myalgias Skin: no skin lesions, no pruritus, Neuro: + weakness, no loss of consciousness, no syncope Psych: no anxiety, no depression, + decrease appetite Heme/Lymph: no bruising, no bleeding  ED Course: Discussed with EDP, patient requiring hospitalization for chief concerns of pain control.  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 every 6 hours as needed for moderate pain, 1 day ordered; morphine  4 mg IV every 4 hours as needed for severe pain, 1 day ordered; fentanyl  25 mcg IV every 4 hours as needed for severe pain not relieved by IV morphine  4 mg, 1 day ordered Scheduled docusate sodium  100 mg p.o. 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 Rennie who states patient will be seen Medical oncology consulted medical pulmonologist, Dr. Isadora is aware  Pure hypercholesterolemia Home rosuvastatin  5 mg nightly resumed  Essential (primary) hypertension Home lisinopril  20 mg daily resumed  Chart reviewed.   DVT prophylaxis: Heparin  5000 units every 8 hours Code  Status: Full code Diet:  heart healthy Family Communication: updated spouse, Heron at bedside  Disposition Plan: Pending clinical course Consults called: none at this time  Admission status:  Telemetry medical, inpatient  Past Medical History:  Diagnosis Date   Allergic rhinitis, cause unspecified    Basal cell carcinoma of skin, site unspecified 04/28/2010   Nasal; [place].   BPH (benign prostatic hypertrophy)    Colon polyps    Essential hypertension, benign    Hemorrhoids, internal    Incomplete bladder emptying    Other abnormal glucose    Over weight    Personal history of colonic polyps    Personal history of other genital system and obstetric disorders(V13.29)    Pure hypercholesterolemia    Unspecified disorder of kidney and ureter    Unspecified disorder of liver    Past Surgical History:  Procedure Laterality Date   BASAL CELL CARCINOMA EXCISION  2012   Facial        [place]   COLONOSCOPY  11/13/2010   single polyp; repeatin 5 years; [place]   COLONOSCOPY N/A 11/04/2021   Procedure: COLONOSCOPY;  Surgeon: Maryruth Ole DASEN, MD;  Location: [facility] ENDOSCOPY;  Service: Endoscopy;  Laterality: N/A;  REQUEST EARLY TIME   COLONOSCOPY WITH PROPOFOL  N/A 12/13/2015   Procedure: COLONOSCOPY WITH PROPOFOL ;  Surgeon: Lamar DASEN Viktoria, MD;  Location: [facility] ENDOSCOPY;  Service: Endoscopy;  Laterality: N/A;   ear surgery  05/2012   [name].  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  Benign.   HEMORRHOID SURGERY  1978   [place]   LAPAROSCOPIC CHOLECYSTECTOMY  1982   rotator cuff surgery Right 05/14/2016   VASECTOMY  1978   Social History:  reports that he has never smoked. He has never used smokeless tobacco. He reports that he does not currently use alcohol after a past usage of about 5.0 standard drinks of alcohol per week. He reports that he does not use drugs.  Allergies  Allergen Reactions   Codeine Nausea Only   Family History  Problem Relation Age of Onset   Heart disease Mother        first MI in 79s   Diabetes Mother    Hyperlipidemia Mother    Arthritis Mother        rheumatoid   Cancer Father        lung   Diabetes Father    Arthritis Sister    Hyperlipidemia Sister     Hyperlipidemia Sister    Hypertension Sister    Hyperlipidemia Sister    Kidney disease Neg Hx    Prostate cancer Neg Hx    Colon cancer Neg Hx    Family history: Family history reviewed and not pertinent.  Prior to Admission medications   Medication Sig Start Date End Date Taking? Authorizing Provider  albuterol  (VENTOLIN  HFA) 108 (90 Base) MCG/ACT inhaler Inhale 1-2 puffs into the lungs every 6 (six) hours as needed. 09/11/23   Corlis Burnard DEL, NP  aspirin EC 81 MG tablet Take 81 mg by mouth daily.    [provider]  Cyanocobalamin  (VITAMIN B 12) 500 MCG TABS Take 100 mg by mouth daily at 6 (six) AM.    [provider]  cyclobenzaprine  (FLEXERIL ) 10 MG tablet Take 1 tablet (10 mg total) by mouth 3 (three) times daily as needed for muscle spasms. 11/11/23   Gasper Nancyann BRAVO, MD  fluticasone  (FLONASE ) 50 MCG/ACT nasal spray Place 2 sprays into both nostrils daily. 02/21/21   Myrla Jon HERO, MD  Garlic 10 MG CAPS Take 10 mg by mouth daily.     [provider]  hydrocortisone 2.5 % cream SMARTSIG:1 Topical Daily 09/13/21   [provider]  ibuprofen  (ADVIL ,MOTRIN ) 200 MG tablet Take 400 mg by mouth every 8 (eight) hours as needed (for pain.).    [provider]  lidocaine  (LIDODERM ) 5 % Place 1 patch onto the skin every 12 (twelve) hours. Remove & Discard patch within 12 hours or as directed by MD 10/16/23 10/15/24  Willo Dunnings, MD  lisinopril  (ZESTRIL ) 40 MG tablet Take 0.5 tablets (20 mg total) by mouth daily. 11/11/23   Gasper Nancyann BRAVO, MD  meloxicam  (MOBIC ) 15 MG tablet Take 15 mg by mouth daily. Patient not taking: Reported on 11/11/2023    [provider]  Multiple Vitamins-Minerals (MULTIVITAMIN PO) Take by mouth daily.    [provider]  nystatin -triamcinolone  ointment (MYCOLOG) Apply 1 Application topically 2 (two) times daily. 09/23/22   Helon Kirsch A, PA-C  Omega-3 Fatty Acids (FISH OIL CONCENTRATE) 1000 MG  CAPS Take 1,000 mg by mouth daily.     [provider]  rosuvastatin  (CRESTOR ) 5 MG tablet Take 1 tablet (5 mg total) by mouth daily. 10/12/23   [name], Angela M, MD  sildenafil  (VIAGRA ) 100 MG tablet Take 1 tablet (100 mg total) by mouth daily as needed for erectile dysfunction. Take two hours prior to intercourse on an empty stomach 09/13/19   Helon, Kirsch A, PA-C  simvastatin  (ZOCOR ) 20 MG tablet TAKE 1 TABLET BY MOUTH AT BEDTIME Patient not taking: Reported on 11/11/2023 05/18/23   [name], Angela M, MD  terbinafine  (LAMISIL ) 250 MG tablet Take 1 tablet (250 mg total) by mouth daily. 09/28/20   [name], Angela M, MD  triamcinolone  cream (KENALOG ) 0.1 % Apply 1 application topically 2 (two) times daily. 02/24/20   Alvia Corean CROME, FNP  vitamin E  400 UNIT capsule Take 400 Units by mouth daily.    [provider]   Physical Exam: Vitals:   11/19/23 1430 11/19/23 1630 11/19/23 1646 11/19/23 1714  BP: 121/77 130/77  127/82  Pulse: (!) 110 98  94  Resp: 16 13  18   Temp:   98.3 F (36.8 C) 98.1 F (36.7 C)  TempSrc:   Oral   SpO2: (!) 88% 97%  99%  Weight:      Height:       Constitutional: appears frail, cachectic, weak, mildly uncomfortable Eyes: PERRL, lids and conjunctivae normal ENMT: Mucous membranes are moist. Posterior pharynx clear of any exudate or lesions. Age-appropriate dentition. Hearing appropriate Neck: normal, supple, no masses, no thyromegaly Respiratory: clear to auscultation bilaterally, no wheezing, no crackles. Normal respiratory effort. No accessory muscle use.  Cardiovascular: Regular rate and rhythm, no murmurs / rubs / gallops. No extremity edema. 2+ pedal pulses. No carotid bruits.  Abdomen: no tenderness, no masses palpated, no hepatosplenomegaly. Bowel sounds positive.  Musculoskeletal: no clubbing / cyanosis. No joint deformity upper and lower extremities. Good ROM, no contractures, no atrophy. Normal muscle tone.  Skin: no  rashes, lesions, ulcers. No induration Neurologic: Sensation intact. Strength 5/5 in all 4.  Psychiatric: Normal judgment and insight. Alert and oriented x 3. Normal mood.   EKG: independently reviewed, showing sinus tachycardia with rate of 124, QTc 431  Chest x-ray on Admission: I personally reviewed and I agree with radiologist reading as below.  CT CHEST WO CONTRAST Result Date: 11/19/2023 CLINICAL DATA:  Generalized weakness. Dyspnea. Concern for lung cancer. * Tracking  Code: BO * EXAM: CT CHEST WITHOUT CONTRAST TECHNIQUE: Multidetector CT imaging of the chest was performed following the standard protocol without IV contrast. RADIATION DOSE REDUCTION: This exam was performed according to the departmental dose-optimization program which includes automated exposure control, adjustment of the mA and/or kV according to patient size and/or use of iterative reconstruction technique. COMPARISON:  Plain film of earlier today.  No prior CT FINDINGS: Cardiovascular: Aortic atherosclerosis. Tortuous thoracic aorta. Normal heart size, without pericardial effusion. Left main and 3 vessel coronary artery calcification. Mediastinum/Nodes: No supraclavicular adenopathy. Subcarinal nodal mass measures 1.6 x 3.2 cm on 69/3. Right hilar nodal mass is poorly delineated secondary to noncontrast technique. Estimated at 3.2 x 2.8 cm on 68/3, causing endobronchial compression as detailed below. Lungs/Pleura: No pleural fluid. Endobronchial marked narrowing, near obstruction involving the bronchus intermedius including on 70/4. More mild narrowing of the proximal right middle and right lower lobe bronchi. Minimal exclusion of the inferior most right lung base. 1.1 x 1.3 cm soft tissue density within the anterior most right lower lobe is favored to be pulmonary parenchymal nodule over an infrahilar node. Example on image 68/4 Scattered calcified granulomas. Upper Abdomen: Nonspecific caudate lobe enlargement. Cholecystectomy.  Normal imaged portions of the spleen, stomach, pancreas, adrenal glands, left kidney. Musculoskeletal: Mid and lower thoracic spondylosis. IMPRESSION: 1. 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 The dominant right hilar nodal mass causes narrowing of the bronchus intermedius. Recommend multidisciplinary thoracic oncology consultation for eventual PET and tissue sampling. Alternatively, sampling via bronchoscopy could be performed. 2. 1.3 cm soft tissue density along the anterior most right lower lobe is favored to represent a parenchymal pulmonary nodule and is most likely the site of primary. An enlarged infrahilar node could look similar. 3. Incidental findings, including: Coronary artery atherosclerosis. Aortic Atherosclerosis (ICD10-I70.0). Electronically Signed   By: Rockey Kilts M.D.   On: 11/19/2023 12:44   DG Chest 2 View Result Date: 11/19/2023 CLINICAL DATA:  weakness EXAM: CHEST - 2 VIEW COMPARISON:  October 16, 2023 FINDINGS: No focal airspace consolidation, pleural effusion, or pneumothorax. No cardiomegaly. Tortuous aorta with aortic atherosclerosis. No acute fracture or destructive lesions. Multilevel thoracic osteophytosis. IMPRESSION: No acute cardiopulmonary abnormality. Electronically Signed   By: Rogelia Myers M.D.   On: 11/19/2023 11:29   Labs on Admission: I have personally reviewed following labs  CBC: Recent Labs  Lab 11/19/23 0957  WBC 8.5  HGB 10.5*  HCT 31.2*  MCV 96.0  PLT 410*   Basic Metabolic Panel: Recent Labs  Lab 11/19/23 0957  NA 133*  K 4.1  CL 97*  CO2 22  GLUCOSE 114*  BUN 18  CREATININE 1.46*  CALCIUM  10.8*   GFR: Estimated Creatinine Clearance: 51.4 mL/min (A) (by C-G formula based on SCr of 1.46 mg/dL (H)).  Liver Function Tests: Recent Labs  Lab 11/19/23 0957  AST 39  ALT 29  ALKPHOS 173*  BILITOT 2.6*  PROT 7.3  ALBUMIN 3.6   Cardiac  Enzymes: Recent Labs  Lab 11/19/23 0957  CKTOTAL 23*   Urine analysis:    Component Value Date/Time   COLORURINE YELLOW (A) 10/16/2023 1526   APPEARANCEUR CLEAR (A) 10/16/2023 1526   APPEARANCEUR Clear 09/13/2019 0900   LABSPEC 1.006 10/16/2023 1526   PHURINE 5.0 10/16/2023 1526   GLUCOSEU NEGATIVE 10/16/2023 1526   HGBUR NEGATIVE 10/16/2023 1526   BILIRUBINUR NEGATIVE 10/16/2023 1526   BILIRUBINUR Negative 09/13/2019 0900   KETONESUR NEGATIVE  10/16/2023 1526   PROTEINUR NEGATIVE 10/16/2023 1526   UROBILINOGEN 0.2 12/11/2015 1417   NITRITE NEGATIVE 10/16/2023 1526   LEUKOCYTESUR NEGATIVE 10/16/2023 1526   This document was prepared using Dragon Voice Recognition software and may include unintentional dictation errors.  Dr. Sherre [facility]  If 7PM-7AM, please contact overnight-coverage provider If 7AM-7PM, please contact day attending provider [email or web address]  11/19/2023, 5:29 PM

## 2023-11-19 NOTE — Assessment & Plan Note (Addendum)
 As needed Zofran  prescribed

## 2023-11-19 NOTE — Assessment & Plan Note (Addendum)
 Continue Crestor

## 2023-11-19 NOTE — Consult Note (Signed)
 [facility] CONSULT NOTE  Patient Care Team: Myrla Jon HERO, MD as PCP - General (Family Medicine)  CHIEF COMPLAINTS/PURPOSE OF CONSULTATION: Abnormal scans.   Oncology History   No history exists.    HISTORY OF PRESENTING ILLNESS:  Ruben Garcia 78 y.o.  male non-smoker and no prior history of malignancy has been referred to oncology further evaluation recommendations of his abnormal imaging.  Patient noted to have worsening shortness of breath and cough over the last few months.  Progressively feeling weak.  He had multiple physician visits-and x-rays ultrasound without any obvious cause of his symptoms.  More recently patient was evaluated by nephrology with worsening renal function and also hypercalcemia.  Given the concerns of malignancy patient was supposed to have outpatient imaging.  However given his progressive symptoms patient presented to the hospital-the CT scan noncontrast showed hilar mass and also mediastinal adenopathy concerning for malignancy.  Also noted to have hypercalcemia and hypoalbuminemia; mild renal sufficiency.  Oncology has been consulted for evaluation recommendations.  Patient is currently resting in the bed.  He also complains of worsening back pain radiating to both hips.  Denies any weakness weakness in the legs.  Denies any incontinence or tingling or numbness of extremities.  Review of Systems  Constitutional:  Positive for malaise/fatigue and weight loss. Negative for chills, diaphoresis and fever.  HENT:  Negative for nosebleeds and sore throat.   Eyes:  Negative for double vision.  Respiratory:  Positive for cough and shortness of breath. Negative for hemoptysis, sputum production and wheezing.   Cardiovascular:  Negative for chest pain, palpitations, orthopnea and leg swelling.  Gastrointestinal:  Negative for abdominal pain, blood in stool, constipation, diarrhea, heartburn, melena, nausea and vomiting.  Genitourinary:   Negative for dysuria, frequency and urgency.  Musculoskeletal:  Positive for back pain. Negative for joint pain.  Skin: Negative.  Negative for itching and rash.  Neurological:  Positive for weakness. Negative for dizziness, tingling, focal weakness and headaches.  Endo/Heme/Allergies:  Does not bruise/bleed easily.  Psychiatric/Behavioral:  Negative for depression. The patient is not nervous/anxious and does not have insomnia.     MEDICAL HISTORY:  Past Medical History:  Diagnosis Date   Allergic rhinitis, cause unspecified    Basal cell carcinoma of skin, site unspecified 04/28/2010   Nasal; [place].   BPH (benign prostatic hypertrophy)    Colon polyps    Essential hypertension, benign    Hemorrhoids, internal    Incomplete bladder emptying    Other abnormal glucose    Over weight    Personal history of colonic polyps    Personal history of other genital system and obstetric disorders(V13.29)    Pure hypercholesterolemia    Unspecified disorder of kidney and ureter    Unspecified disorder of liver     SURGICAL HISTORY: Past Surgical History:  Procedure Laterality Date   BASAL CELL CARCINOMA EXCISION  2012   Facial        [place]   COLONOSCOPY  11/13/2010   single polyp; repeatin 5 years; [place]   COLONOSCOPY N/A 11/04/2021   Procedure: COLONOSCOPY;  Surgeon: Maryruth Ole DASEN, MD;  Location: [facility] ENDOSCOPY;  Service: Endoscopy;  Laterality: N/A;  REQUEST EARLY TIME   COLONOSCOPY WITH PROPOFOL  N/A 12/13/2015   Procedure: COLONOSCOPY WITH PROPOFOL ;  Surgeon: Lamar DASEN Viktoria, MD;  Location: [facility] ENDOSCOPY;  Service: Endoscopy;  Laterality: N/A;   ear surgery  05/2012   [name].  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  Benign.   HEMORRHOID  SURGERY  1978   [place]   LAPAROSCOPIC CHOLECYSTECTOMY  1982   rotator cuff surgery Right 05/14/2016   VASECTOMY  1978    SOCIAL HISTORY: Social History   Socioeconomic History   Marital status: Married    Spouse name: Heron   Number of  children: 2   Years of education: 2 years college   Highest education level: Not on file  Occupational History   Occupation: retired    Comment: postal service in 2005  Tobacco Use   Smoking status: Never   Smokeless tobacco: Never  Vaping Use   Vaping status: Never Used  Substance and Sexual Activity   Alcohol use: Not Currently    Alcohol/week: 5.0 standard drinks of alcohol    Types: 5 Cans of beer per week    Comment: weekends beer, 4- 6 total Miller [employer]   Drug use: No   Sexual activity: Yes    Partners: Female  Other Topics Concern   Not on file  Social History Narrative   Always uses seat belts. Smoke alarm and carbon monoxide detector in the home. Guns in the home stored in locked cabinet.Caffeine use: Coffee 2 servings per day, moderate amount. Exercise: Moderate walking 2 - 4 miles daily, 2 - 3 days per week.    Marital status:  Married x 47 years, happily.      Children:  2 children; 1 grandchild (20).      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      Tobacco: never      Alcohol:  Weekends; beer 4-6 per weekend.      Exercise:  Walking every other day 2.5 miles three times per day.      Seatbelt: 100%; no texting      Guns: secured guns.      Living Will: completed in 2014.  FULL CODE but no prolonged measures.        ADLs: independent with ADLs; no assistant devices   Social Drivers of Health   Financial Resource Strain: Not on file  Food Insecurity: No Food Insecurity (11/19/2023)   Hunger Vital Sign    Worried About Running Out of Food in the Last Year: Never true    Ran Out of Food in the Last Year: Never true  Transportation Needs: No Transportation Needs (11/19/2023)   PRAPARE - Administrator, Civil Service (Medical): No    Lack of Transportation (Non-Medical): No  Physical Activity: Not on file  Stress: Not on file  Social Connections: Unknown (11/19/2023)   Social Connection and Isolation Panel    Frequency of  Communication with Friends and Family: Three times a week    Frequency of Social Gatherings with Friends and Family: Three times a week    Attends Religious Services: Patient declined    Active Member of Clubs or Organizations: Patient declined    Attends Banker Meetings: Patient declined    Marital Status: Married  Catering manager Violence: Patient Declined (11/19/2023)   Humiliation, Afraid, Rape, and Kick questionnaire    Fear of Current or Ex-Partner: Patient declined    Emotionally Abused: Patient declined    Physically Abused: Patient declined    Sexually Abused: Patient declined    FAMILY HISTORY: Family History  Problem Relation Age of Onset   Heart disease Mother        first MI in 32s   Diabetes Mother    Hyperlipidemia Mother    Arthritis  Mother        rheumatoid   Cancer Father        lung   Diabetes Father    Arthritis Sister    Hyperlipidemia Sister    Hyperlipidemia Sister    Hypertension Sister    Hyperlipidemia Sister    Kidney disease Neg Hx    Prostate cancer Neg Hx    Colon cancer Neg Hx     ALLERGIES:  is allergic to codeine.  MEDICATIONS:  Current Facility-Administered Medications  Medication Dose Route Frequency Provider Last Rate Last Admin   acetaminophen  (TYLENOL ) tablet 650 mg  650 mg Oral Q6H PRN Cox, Amy N, DO       Or   acetaminophen  (TYLENOL ) suppository 650 mg  650 mg Rectal Q6H PRN Cox, Amy N, DO       albuterol  (PROVENTIL ) (2.5 MG/3ML) 0.083% nebulizer solution 2.5 mg  2.5 mg Inhalation Q6H PRN Cox, Amy N, DO       [START ON 11/20/2023] cyanocobalamin  (VITAMIN B12) tablet 1,000 mcg  1,000 mcg Oral Q0600 Cox, Amy N, DO       cyclobenzaprine  (FLEXERIL ) tablet 10 mg  10 mg Oral TID PRN Cox, Amy N, DO       docusate sodium  (COLACE) capsule 100 mg  100 mg Oral BID Cox, Amy N, DO       fentaNYL  (SUBLIMAZE ) injection 25 mcg  25 mcg Intravenous Q4H PRN Cox, Amy N, DO       [START ON 11/20/2023] fluticasone  (FLONASE ) 50 MCG/ACT  nasal spray 2 spray  2 spray Each Nare Daily Cox, Amy N, DO       heparin  injection 5,000 Units  5,000 Units Subcutaneous Q8H Cox, Amy N, DO       lactated ringers  infusion   Intravenous Continuous Cox, Amy N, DO 125 mL/hr at 11/19/23 1444 New Bag at 11/19/23 1444   [START ON 11/20/2023] lisinopril  (ZESTRIL ) tablet 20 mg  20 mg Oral Daily Cox, Amy N, DO       morphine  (PF) 4 MG/ML injection 4 mg  4 mg Intravenous Q2H PRN Cox, Amy N, DO       ondansetron  (ZOFRAN ) tablet 4 mg  4 mg Oral Q6H PRN Cox, Amy N, DO       Or   ondansetron  (ZOFRAN ) injection 4 mg  4 mg Intravenous Q6H PRN Cox, Amy N, DO       oxyCODONE  (Oxy IR/ROXICODONE ) immediate release tablet 5 mg  5 mg Oral Q6H PRN Cox, Amy N, DO       promethazine (PHENERGAN) 12.5 mg in sodium chloride  0.9 % 50 mL IVPB  12.5 mg Intravenous Q6H PRN Cox, Amy N, DO       rosuvastatin  (CRESTOR ) tablet 5 mg  5 mg Oral QHS Cox, Amy N, DO       [START ON 11/20/2023] vitamin E  capsule 400 Units  400 Units Oral Daily Cox, Amy N, DO        PHYSICAL EXAMINATION:  Vitals:   11/19/23 1714 11/19/23 2023  BP: 127/82 116/75  Pulse: 94 89  Resp: 18 17  Temp: 98.1 F (36.7 C) 98.3 F (36.8 C)  SpO2: 99% 95%   Filed Weights   11/19/23 0953  Weight: 209 lb (94.8 kg)    Physical Exam Vitals and nursing note reviewed.  HENT:     Head: Normocephalic and atraumatic.     Mouth/Throat:     Pharynx: Oropharynx is clear.  Eyes:  Extraocular Movements: Extraocular movements intact.     Pupils: Pupils are equal, round, and reactive to light.  Cardiovascular:     Rate and Rhythm: Normal rate and regular rhythm.  Pulmonary:     Comments: Decreased breath sounds bilaterally.  Abdominal:     Palpations: Abdomen is soft.  Musculoskeletal:        General: Normal range of motion.     Cervical back: Normal range of motion.  Skin:    General: Skin is warm.  Neurological:     General: No focal deficit present.     Mental Status: He is alert and oriented to  person, place, and time.  Psychiatric:        Behavior: Behavior normal.        Judgment: Judgment normal.     LABORATORY DATA:  I have reviewed the data as listed Lab Results  Component Value Date   WBC 8.5 11/19/2023   HGB 10.5 (L) 11/19/2023   HCT 31.2 (L) 11/19/2023   MCV 96.0 11/19/2023   PLT 410 (H) 11/19/2023   Recent Labs    03/31/23 0930 10/08/23 0900 10/16/23 1330 10/26/23 0816 11/04/23 0846 11/12/23 0900 11/19/23 0957  NA 143   < > 133* 135 132* 135 133*  K 4.9   < > 4.6 5.6* 5.7* 5.0 4.1  CL 103   < > 99 96 94* 96 97*  CO2 26   < > 23 21 19* 19* 22  GLUCOSE 103*   < > 137* 107* 111* 112* 114*  BUN 11   < > 22 18 18 20 18   CREATININE 1.21   < > 1.77* 1.56* 1.68* 1.84* 1.46*  CALCIUM  10.0   < > 9.8 11.0* 11.5* 11.3* 10.8*  GFRNONAA  --   --  40*  --   --   --  50*  PROT 7.4   < > 7.6 6.8 7.2  --  7.3  ALBUMIN 4.7   < > 3.9 4.3 4.1  --  3.6  AST 34   < > 31 34 40  --  39  ALT 38   < > 26 24 41  --  29  ALKPHOS 52   < > 114 205* 223*  --  173*  BILITOT 1.7*  1.9*   < > 1.8* 1.6* 1.8*  --  2.6*  BILIDIR 0.40  --   --   --   --   --   --   IBILI 1.30*  --   --   --   --   --   --    < > = values in this interval not displayed.    RADIOGRAPHIC STUDIES: I have personally reviewed the radiological images as listed and agreed with the findings in the report. CT CHEST WO CONTRAST Result Date: 11/19/2023 CLINICAL DATA:  Generalized weakness. Dyspnea. Concern for lung cancer. * Tracking Code: BO * EXAM: CT CHEST WITHOUT CONTRAST TECHNIQUE: Multidetector CT imaging of the chest was performed following the standard protocol without IV contrast. RADIATION DOSE REDUCTION: This exam was performed according to the departmental dose-optimization program which includes automated exposure control, adjustment of the mA and/or kV according to patient size and/or use of iterative reconstruction technique. COMPARISON:  Plain film of earlier today.  No prior CT FINDINGS:  Cardiovascular: Aortic atherosclerosis. Tortuous thoracic aorta. Normal heart size, without pericardial effusion. Left main and 3 vessel coronary artery calcification. Mediastinum/Nodes: No supraclavicular adenopathy. Subcarinal nodal mass measures 1.6  x 3.2 cm on 69/3. Right hilar nodal mass is poorly delineated secondary to noncontrast technique. Estimated at 3.2 x 2.8 cm on 68/3, causing endobronchial compression as detailed below. Lungs/Pleura: No pleural fluid. Endobronchial marked narrowing, near obstruction involving the bronchus intermedius including on 70/4. More mild narrowing of the proximal right middle and right lower lobe bronchi. Minimal exclusion of the inferior most right lung base. 1.1 x 1.3 cm soft tissue density within the anterior most right lower lobe is favored to be pulmonary parenchymal nodule over an infrahilar node. Example on image 68/4 Scattered calcified granulomas. Upper Abdomen: Nonspecific caudate lobe enlargement. Cholecystectomy. Normal imaged portions of the spleen, stomach, pancreas, adrenal glands, left kidney. Musculoskeletal: Mid and lower thoracic spondylosis. IMPRESSION: 1. 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 The dominant right hilar nodal mass causes narrowing of the bronchus intermedius. Recommend multidisciplinary thoracic oncology consultation for eventual PET and tissue sampling. Alternatively, sampling via bronchoscopy could be performed. 2. 1.3 cm soft tissue density along the anterior most right lower lobe is favored to represent a parenchymal pulmonary nodule and is most likely the site of primary. An enlarged infrahilar node could look similar. 3. Incidental findings, including: Coronary artery atherosclerosis. Aortic Atherosclerosis (ICD10-I70.0). Electronically Signed   By: Rockey Kilts M.D.   On: 11/19/2023 12:44   DG Chest 2 View Result Date:  11/19/2023 CLINICAL DATA:  weakness EXAM: CHEST - 2 VIEW COMPARISON:  October 16, 2023 FINDINGS: No focal airspace consolidation, pleural effusion, or pneumothorax. No cardiomegaly. Tortuous aorta with aortic atherosclerosis. No acute fracture or destructive lesions. Multilevel thoracic osteophytosis. IMPRESSION: No acute cardiopulmonary abnormality. Electronically Signed   By: Rogelia Myers M.D.   On: 11/19/2023 11:29   US  Abdomen Limited RUQ (LIVER/GB) Result Date: 11/04/2023 CLINICAL DATA:  Elevated bilirubin and alkaline phosphatase EXAM: ULTRASOUND ABDOMEN LIMITED RIGHT UPPER QUADRANT COMPARISON:  None available FINDINGS: Gallbladder: Surgically absent Common bile duct: Diameter: 5 mm Liver: Parenchymal echogenicity: Diffusely increased Contours: Normal Lesions: None Portal vein: Patent.  Hepatopetal flow Other: None. IMPRESSION: Diffuse increased echogenicity of the hepatic parenchyma is a nonspecific indicator of hepatocellular dysfunction, most commonly steatosis. Electronically Signed   By: Aliene Lloyd M.D.   On: 11/04/2023 72:38   # 78 year old male patient non-smoker and no prior history of malignancy is currently admitted to hospital for hypercalcemia/renal insufficiency-worsening back pain and also imaging findings concerning for malignancy.  # Right hilar mass/mediastinal adenopathy highly concerning for malignancy.  # Lower back pain-concerning for malignancy involving the back.  On narcotics.  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  # Recommendations/plan:  # Recommend proceeding with Zometa  given the concern for malignant hypercalcemia.  Check PTH/PTH related peptide; Vit D 1,25.   IV fluids as ordered  # Recommend MRI of the lower back-given the concern for malignant involvement.  # Check fractionated bilirubin.   # Recommend pulmonary evaluation-for biopsy  planning.  Discussed with Dr. Seabron kindly agrees to evaluate the patient in the morning tomorrow.  Thank you Dr.Cox  for allowing me to participate in the care of your pleasant patient. Please do not hesitate to contact me with questions or concerns in the interim.  Discussed with Dr. Sherre.  Above plan of care was discussed with patient and family in detail.  They are in agreement.   # I reviewed the blood work- with the patient in detail;  also reviewed the imaging independently [as summarized above]; and with the patient in detail.

## 2023-11-19 NOTE — Hospital Course (Signed)
 Mr. Ruben Garcia is a 78 year old male with history of hypertension, CKD stage IIIa, basal cell carcinoma, who presents ED for chief concerns of generalized weakness and allover pain for weeks.  Vitals in the ED showed T of 98.3, respiration of 18, heart rate 125, blood pressure 126/81, SpO2 99% on room air.  Serum sodium is 133, potassium 4.1, chloride 97, bicarb 22, BUN 18, serum creatinine 1.46, EGFR 50, nonfasting blood glucose 114, WBC 8.5, hemoglobin 10.5, platelets of 410.  CK is 23.  HS troponin is 9.  ED treatment: Morphine  4 mg IV one-time dose, ondansetron  4 mg IV one-time dose, sodium chloride  500 mL liter bolus.

## 2023-11-19 NOTE — Assessment & Plan Note (Addendum)
 Continue lisinopril

## 2023-11-19 NOTE — Telephone Encounter (Signed)
 Patient is currently being evaluated at ED.

## 2023-11-19 NOTE — ED Triage Notes (Signed)
 PT in with co generalized weakness for few weeks. States does feels dizzy at times, no loc. Pt co pain all over states worse to lower back into legs. Pt is going through testing for possible lung CA.

## 2023-11-19 NOTE — Assessment & Plan Note (Addendum)
 Suspect neoplastic related pain Patient was only using morphine  here in the hospital.  Switched over to oxycodone  and tolerated it without nausea.  Will prescribe oxycodone  5 mg every 6 hours as needed for severe pain.  5-day supply of pain medications prescribed.  Needs to get refill from primary care physician or oncology.  Can use Tylenol  for mild pain.

## 2023-11-20 ENCOUNTER — Telehealth: Payer: Self-pay | Admitting: Student in an Organized Health Care Education/Training Program

## 2023-11-20 DIAGNOSIS — C3401 Malignant neoplasm of right main bronchus: Secondary | ICD-10-CM | POA: Insufficient documentation

## 2023-11-20 DIAGNOSIS — M5442 Lumbago with sciatica, left side: Secondary | ICD-10-CM

## 2023-11-20 DIAGNOSIS — R918 Other nonspecific abnormal finding of lung field: Secondary | ICD-10-CM | POA: Diagnosis not present

## 2023-11-20 DIAGNOSIS — R52 Pain, unspecified: Secondary | ICD-10-CM | POA: Diagnosis not present

## 2023-11-20 DIAGNOSIS — M5441 Lumbago with sciatica, right side: Secondary | ICD-10-CM

## 2023-11-20 DIAGNOSIS — C3431 Malignant neoplasm of lower lobe, right bronchus or lung: Principal | ICD-10-CM

## 2023-11-20 DIAGNOSIS — I10 Essential (primary) hypertension: Secondary | ICD-10-CM

## 2023-11-20 LAB — BASIC METABOLIC PANEL WITH GFR
Anion gap: 11 (ref 5–15)
BUN: 16 mg/dL (ref 8–23)
CO2: 25 mmol/L (ref 22–32)
Calcium: 9.8 mg/dL (ref 8.9–10.3)
Chloride: 101 mmol/L (ref 98–111)
Creatinine, Ser: 1.3 mg/dL — ABNORMAL HIGH (ref 0.61–1.24)
GFR, Estimated: 57 mL/min — ABNORMAL LOW (ref >60)
Glucose, Bld: 90 mg/dL (ref 70–99)
Potassium: 4.2 mmol/L (ref 3.5–5.1)
Sodium: 137 mmol/L (ref 135–145)

## 2023-11-20 LAB — CBC
HCT: 27 % — ABNORMAL LOW (ref 39.0–52.0)
Hemoglobin: 8.9 g/dL — ABNORMAL LOW (ref 13.0–17.0)
MCH: 31.6 pg (ref 26.0–34.0)
MCHC: 33 g/dL (ref 30.0–36.0)
MCV: 95.7 fL (ref 80.0–100.0)
Platelets: 340 K/uL (ref 150–400)
RBC: 2.82 MIL/uL — ABNORMAL LOW (ref 4.22–5.81)
RDW: 13.3 % (ref 11.5–15.5)
WBC: 7.8 K/uL (ref 4.0–10.5)
nRBC: 0 % (ref 0.0–0.2)

## 2023-11-20 LAB — PARATHYROID HORMONE, INTACT (NO CA): PTH: <6 pg/mL — ABNORMAL LOW (ref 15–65)

## 2023-11-20 MED ORDER — MORPHINE SULFATE (PF) 2 MG/ML IV SOLN
2.0000 mg | INTRAVENOUS | Status: DC | PRN
  Administered 2023-11-20 – 2023-11-21 (×4): 2 mg via INTRAVENOUS
  Filled 2023-11-20 (×6): qty 1

## 2023-11-20 MED ORDER — CYCLOBENZAPRINE HCL 10 MG PO TABS
10.0000 mg | ORAL_TABLET | Freq: Three times a day (TID) | ORAL | Status: DC
Start: 2023-11-20 — End: 2023-11-21
  Administered 2023-11-20 – 2023-11-21 (×4): 10 mg via ORAL
  Filled 2023-11-20 (×4): qty 1

## 2023-11-20 NOTE — Progress Notes (Signed)
 [facility]  - [place] [facility]   PATIENT NAME: Ruben Garcia    MR#:  [id number]  DATE OF BIRTH:  Dec 06, 1945  SUBJECTIVE:  wife and daughter at bedside patient came in with shortness of breath, cough and significant back pain. Patient went to [facility] ER, urgent care, emergency room here and got round of steroids, antibiotic and some pain meds without any relief. Came to the emergency room yesterday with significant back pain. Patient was evaluated outpatient by nephrology for worsening creatinine.   VITALS:  Blood pressure (!) 162/79, pulse (!) 101, temperature 98 F (36.7 C), resp. rate 18, height 6' (1.829 m), weight 94.8 kg, SpO2 93%.  PHYSICAL EXAMINATION:   GENERAL:  78 y.o.-year-old patient with no acute distress.  LUNGS: Normal breath sounds bilaterally, no wheezing CARDIOVASCULAR: S1, S2 normal. No murmur   ABDOMEN: Soft, nontender, nondistended. Bowel sounds present.  EXTREMITIES: No  edema b/l.    NEUROLOGIC: nonfocal  patient is alert and awake SKIN: No obvious rash, lesion, or ulcer.   LABORATORY PANEL:  CBC Recent Labs  Lab 11/20/23 0511  WBC 7.8  HGB 8.9*  HCT 27.0*  PLT 340    Chemistries  Recent Labs  Lab 11/19/23 0957 11/19/23 1347 11/20/23 0511  NA 133*  --  137  K 4.1  --  4.2  CL 97*  --  101  CO2 22  --  25  GLUCOSE 114*  --  90  BUN 18  --  16  CREATININE 1.46*  --  1.30*  CALCIUM  10.8*  --  9.8  AST 39  --   --   ALT 29  --   --   ALKPHOS 173*  --   --   BILITOT 2.6* 2.5*  --    Cardiac Enzymes No results for input(s): TROPONINI in the last 168 hours. RADIOLOGY:  MR Lumbar Spine W Wo Contrast Result Date: 11/19/2023 CLINICAL DATA:  Low back pain, cancer suspected EXAM: MRI LUMBAR SPINE WITHOUT AND WITH CONTRAST TECHNIQUE: Multiplanar and multiecho pulse sequences of the lumbar spine were obtained without and with intravenous contrast. CONTRAST:  9mL GADAVIST  GADOBUTROL  1 MMOL/ML IV SOLN COMPARISON:  None Available.  FINDINGS: Segmentation:  Standard. Alignment:  Physiologic. Vertebrae: T1 hypointense, stir hyperintense, and enhancing lesions at L2 and involving the partially imaged sacral vertebral bodies and sacrum, compatible with metastases. Conus medullaris and cauda equina: Conus extends to the L1 level. Conus and cauda equina appear normal. Paraspinal and other soft tissues: Partially imaged suspected extraosseous extension of tumor involving the pelvis on the left and possibly the right. Disc levels: T12-L1: No significant disc protrusion, foraminal stenosis, or canal stenosis. L1-L2: No significant disc protrusion, foraminal stenosis, or canal stenosis. L2-L3: No significant disc protrusion, foraminal stenosis, or canal stenosis. L3-L4: Mild facet arthropathy.  No significant stenosis. L4-L5: Moderate facet arthropathy and mild disc bulging. Mild left foraminal stenosis. Patent canal. L5-S1: Moderate bilateral facet arthropathy and mild disc bulging. Mild right foraminal stenosis. Pain canal. IMPRESSION: 1. 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 2. Partially imaged suspected extraosseous extension of tumor involving the pelvis on the left and potentially the right. Recommend MRI of the pelvis with contrast for further evaluation. Electronically Signed   By: Gilmore GORMAN Molt M.D.   On: 11/19/2023 23:53   CT CHEST WO CONTRAST Result Date: 11/19/2023 CLINICAL DATA:  Generalized weakness. Dyspnea. Concern for lung cancer. * Tracking Code: BO * EXAM:  CT CHEST WITHOUT CONTRAST TECHNIQUE: Multidetector CT imaging of the chest was performed following the standard protocol without IV contrast. RADIATION DOSE REDUCTION: This exam was performed according to the departmental dose-optimization program which includes automated exposure control, adjustment of the mA and/or kV according to patient size and/or use of iterative reconstruction technique. COMPARISON:  Plain  film of earlier today.  No prior CT FINDINGS: Cardiovascular: Aortic atherosclerosis. Tortuous thoracic aorta. Normal heart size, without pericardial effusion. Left main and 3 vessel coronary artery calcification. Mediastinum/Nodes: No supraclavicular adenopathy. Subcarinal nodal mass measures 1.6 x 3.2 cm on 69/3. Right hilar nodal mass is poorly delineated secondary to noncontrast technique. Estimated at 3.2 x 2.8 cm on 68/3, causing endobronchial compression as detailed below. Lungs/Pleura: No pleural fluid. Endobronchial marked narrowing, near obstruction involving the bronchus intermedius including on 70/4. More mild narrowing of the proximal right middle and right lower lobe bronchi. Minimal exclusion of the inferior most right lung base. 1.1 x 1.3 cm soft tissue density within the anterior most right lower lobe is favored to be pulmonary parenchymal nodule over an infrahilar node. Example on image 68/4 Scattered calcified granulomas. Upper Abdomen: Nonspecific caudate lobe enlargement. Cholecystectomy. Normal imaged portions of the spleen, stomach, pancreas, adrenal glands, left kidney. Musculoskeletal: Mid and lower thoracic spondylosis. IMPRESSION: 1. 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 The dominant right hilar nodal mass causes narrowing of the bronchus intermedius. Recommend multidisciplinary thoracic oncology consultation for eventual PET and tissue sampling. Alternatively, sampling via bronchoscopy could be performed. 2. 1.3 cm soft tissue density along the anterior most right lower lobe is favored to represent a parenchymal pulmonary nodule and is most likely the site of primary. An enlarged infrahilar node could look similar. 3. Incidental findings, including: Coronary artery atherosclerosis. Aortic Atherosclerosis (ICD10-I70.0). Electronically Signed   By: Rockey Kilts M.D.   On: 11/19/2023 12:44    DG Chest 2 View Result Date: 11/19/2023 CLINICAL DATA:  weakness EXAM: CHEST - 2 VIEW COMPARISON:  October 16, 2023 FINDINGS: No focal airspace consolidation, pleural effusion, or pneumothorax. No cardiomegaly. Tortuous aorta with aortic atherosclerosis. No acute fracture or destructive lesions. Multilevel thoracic osteophytosis. IMPRESSION: No acute cardiopulmonary abnormality. Electronically Signed   By: Rogelia Myers M.D.   On: 11/19/2023 11:29    Assessment and Plan Rosco Harriott is a 78 year old male with history of hypertension, CKD stage IIIa, basal cell carcinoma, who presents ED for chief concerns of generalized weakness and allover pain for weeks.   CT chest without contrast 1. 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 The dominant right hilar nodal mass causes narrowing of the bronchus intermedius. Recommend multidisciplinary thoracic oncology consultation for eventual PET and tissue sampling. Alternatively, sampling via bronchoscopy could be performed. 2. 1.3 cm soft tissue density along the anterior most right lower lobe is favored to represent a parenchymal pulmonary nodule and is most likely the site of primary. An enlarged infrahilar node could look similar. 3. Incidental findings, including: Coronary artery atherosclerosis. Aortic Atherosclerosis (ICD10-I70.0).  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 2. Partially imaged suspected extraosseous extension of tumor involving the pelvis on the left and potentially the right. Recommend MRI of the pelvis with contrast for further evaluation.  MRI pelvis pending  Lung mass with bony lesion noted on MRI suggestive of metastasis -- patient isnon-smoker recently experienced cough  and shortness of breath with back pain -- oncology consultation with Dr.  Rennie. 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 Creatinine better. -- Patient followed by nephrology Dr. Marcelino      Procedures: Family communication : wife Consults : oncology, pulmonary CODE STATUS: full DVT Prophylaxis : heparin  Level of care: Telemetry Medical Status is: Inpatient Remains inpatient appropriate because: back pain, workup for lung mass    TOTAL TIME TAKING CARE OF THIS PATIENT: 35 minutes.  >50% time spent on counselling and coordination of care  Note: This dictation was prepared with Dragon dictation along with smaller phrase technology. Any transcriptional errors that result from this process are unintentional.  Leita Blanch M.D    [facility]   CC: Primary care physician; Myrla Jon HERO, MD

## 2023-11-20 NOTE — Plan of Care (Signed)

## 2023-11-20 NOTE — Consult Note (Signed)
 NAME:  Ruben Garcia, MRN:  [id number], DOB:  1945-08-10, LOS: 1 ADMISSION DATE:  11/19/2023, CONSULTATION DATE:  11/20/2023 REFERRING MD:  Leita Blanch, MD, CHIEF COMPLAINT:  Pulmonary Mass   History of Present Illness:   Patient is a pleasant 78 year old male with a past medical history of hypertension and hyperlipidemia who presented to the hospital with malignant hypercalcemia.  Pulmonary is consulted for the evaluation of possible lung cancer.  He reports shortness of breath and a cough for the past 2 months.  The cough has been dry and nonproductive.  He denies any hemoptysis.  He has been feeling weak and unsteady on his feet but denies any focal weakness.  He has felt ill overall.  He does not carry any diagnosis of malignancy.  Patient was seen by Dr. Lazarus from nephrology on 7/22 for the evaluation of kidney disease as well as hypercalcemia.  Given concern for skeletal pain, malignancy was considered and a workup was ordered.  Patient then came to the ED yesterday with progressive weakness as well as generalized pains.  In the ED, pain was controlled with IV morphine .  A CT scan of the chest was performed which showed hilar mediastinal lymphadenopathy with narrowing of the bronchus intermedius suggesting right-sided bronchogenic carcinoma.  Pulmonary is consulted for evaluation and consideration of biopsy.  He denies any smoking history, but reports second hand smoke exposure. 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 Denies any occupational exposures.  Pertinent  Medical History  -HTN -HLD  Objective    Blood pressure (!) 162/79, pulse (!) 101, temperature 98 F (36.7 C), resp. rate 18, height 6' (1.829 m), weight 94.8 kg, SpO2 93%.        Intake/Output Summary (Last 24 hours) at 11/20/2023 0949 Last data filed at 11/20/2023 9377 Gross per 24 hour  Intake 1944.93 ml  Output --  Net 1944.93 ml   Filed Weights   11/19/23 0953   Weight: 94.8 kg    Examination: Physical Exam Constitutional:      General: He is not in acute distress.    Appearance: Normal appearance. He is not ill-appearing.  Cardiovascular:     Rate and Rhythm: Normal rate and regular rhythm.     Pulses: Normal pulses.     Heart sounds: Normal heart sounds.  Pulmonary:     Effort: Pulmonary effort is normal.     Breath sounds: Wheezing (over the right lung field anteriorly) present.  Neurological:     General: No focal deficit present.     Mental Status: He is alert and oriented to person, place, and time. Mental status is at baseline.     Assessment and Plan   #Lung Mass #Mediastinal Lymphadenopathy  Pleasant 78 year old male non-smoker presenting with shortness of breath, cough, back pain and electrolyte imbalances (hypercalcemia). 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 On exam he has a wheeze over his right lung field which is consistent with findings on CT.  I have personally reviewed his chest CT and note both hilar and mediastinal lymphadenopathy. There is also narrowing of the bronchus intermedius that is suggestive of malignancy. The differential includes small cell vs non-small cell lung cancer. Lymphoma is also on the differential, albeit less likely.  I discussed these findings with the patient and his wife, and went over his chest imaging. I explained  that the next step would be to obtain tissue for diagnosis. While he has metastasis to bone, which would allow a diagnosis, it would not provide enough tissue for molecular testing and next generation sequencing. Would recommend proceeding with bronchoscopy for fiber optic evaluation of the right bronchus intermedius as well as EBUS of the subcarinal lymph node for tissue acquisition.  We discussed the importance of diagnosis and staging in lung malignancies, and the approach to obtaining a tissue diagnosis which  would include flexible bronchoscopy with endobronchial ultrasound guided sampling.  We also discussed the risks associated with the procedure which include a <2 % risk of pneumothorax, infection, and bleeding.  I explained that patients typically are able to return home the same day of the procedure, but in rare cases admission to the hospital for observation and treatment is required.  After our discussion, the patient elected to proceed with the procedure. Will arrange for bronchoscopy on Tuesday 11/24/2023.  [name], MD [facility] 11/20/2023 3:06 PM   Labs   CBC: Recent Labs  Lab 11/19/23 0957 11/20/23 0511  WBC 8.5 7.8  HGB 10.5* 8.9*  HCT 31.2* 27.0*  MCV 96.0 95.7  PLT 410* 340    Basic Metabolic Panel: Recent Labs  Lab 11/19/23 0957 11/20/23 0511  NA 133* 137  K 4.1 4.2  CL 97* 101  CO2 22 25  GLUCOSE 114* 90  BUN 18 16  CREATININE 1.46* 1.30*  CALCIUM  10.8* 9.8   GFR: Estimated Creatinine Clearance: 57.8 mL/min (A) (by C-G formula based on SCr of 1.3 mg/dL (H)). Recent Labs  Lab 11/19/23 0957 11/20/23 0511  WBC 8.5 7.8    Liver Function Tests: Recent Labs  Lab 11/19/23 0957 11/19/23 1347  AST 39  --   ALT 29  --   ALKPHOS 173*  --   BILITOT 2.6* 2.5*  PROT 7.3  --   ALBUMIN 3.6  --    No results for input(s): LIPASE, AMYLASE in the last 168 hours. No results for input(s): AMMONIA in the last 168 hours.  ABG No results found for: PHART, PCO2ART, PO2ART, HCO3, TCO2, ACIDBASEDEF, O2SAT   Coagulation Profile: No results for input(s): INR, PROTIME in the last 168 hours.  Cardiac Enzymes: Recent Labs  Lab 11/19/23 0957  CKTOTAL 23*    HbA1C: Hgb A1c MFr Bld  Date/Time Value Ref Range Status  10/08/2023 09:00 AM 5.5 4.8 - 5.6 % Final    Comment:             Prediabetes: 5.7 - 6.4          Diabetes: >6.4          Glycemic control for adults with diabetes: <7.0   03/31/2023 09:30 AM 5.5  4.8 - 5.6 % Final    Comment:             Prediabetes: 5.7 - 6.4          Diabetes: >6.4          Glycemic control for adults with diabetes: <7.0     CBG: No results for input(s): GLUCAP in the last 168 hours.  Review of Systems:   Review of Systems  Constitutional:  Negative for chills and fever.  Respiratory:  Positive for cough, shortness of breath and wheezing. Negative for hemoptysis and sputum production.   Cardiovascular:  Negative for chest pain.     Past Medical History:  He,  has a past medical history of Allergic  rhinitis, cause unspecified, Basal cell carcinoma of skin, site unspecified (04/28/2010), BPH (benign prostatic hypertrophy), Colon polyps, Essential hypertension, benign, Hemorrhoids, internal, Incomplete bladder emptying, Other abnormal glucose, Over weight, Personal history of colonic polyps, Personal history of other genital system and obstetric disorders(V13.29), Pure hypercholesterolemia, Unspecified disorder of kidney and ureter, and Unspecified disorder of liver.   Surgical History:   Past Surgical History:  Procedure Laterality Date   BASAL CELL CARCINOMA EXCISION  2012   Facial        [place]   COLONOSCOPY  11/13/2010   single polyp; repeatin 5 years; [place]   COLONOSCOPY N/A 11/04/2021   Procedure: COLONOSCOPY;  Surgeon: Maryruth Ole DASEN, MD;  Location: [facility] ENDOSCOPY;  Service: Endoscopy;  Laterality: N/A;  REQUEST EARLY TIME   COLONOSCOPY WITH PROPOFOL  N/A 12/13/2015   Procedure: COLONOSCOPY WITH PROPOFOL ;  Surgeon: Lamar DASEN [place]toria, MD;  Location: [facility] ENDOSCOPY;  Service: Endoscopy;  Laterality: N/A;   ear surgery  05/2012   [name].  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  Benign.   HEMORRHOID SURGERY  1978   [place]   LAPAROSCOPIC CHOLECYSTECTOMY  1982   rotator cuff surgery Right 05/14/2016   VASECTOMY  1978     Social History:   reports that he has never smoked. He has never used smokeless tobacco. He reports that he does not currently use alcohol  after a past usage of about 5.0 standard drinks of alcohol per week. He reports that he does not use drugs.   Family History:  His family history includes Arthritis in his mother and sister; Cancer in his father; Diabetes in his father and mother; Heart disease in his mother; Hyperlipidemia in his mother, sister, sister, and sister; Hypertension in his sister. There is no history of Kidney disease, Prostate cancer, or Colon cancer.   Allergies Allergies  Allergen Reactions   Codeine Nausea Only     Home Medications  Prior to Admission medications   Medication Sig Start Date End Date Taking? Authorizing Provider  albuterol  (VENTOLIN  HFA) 108 (90 Base) MCG/ACT inhaler Inhale 1-2 puffs into the lungs every 6 (six) hours as needed. 09/11/23  Yes Corlis Burnard DEL, NP  Cyanocobalamin  (VITAMIN B 12) 500 MCG TABS Take 100 mg by mouth daily at 6 (six) AM.   Yes [provider]  cyclobenzaprine  (FLEXERIL ) 10 MG tablet Take 1 tablet (10 mg total) by mouth 3 (three) times daily as needed for muscle spasms. 11/11/23  Yes Gasper Nancyann BRAVO, MD  ibuprofen  (ADVIL ,MOTRIN ) 200 MG tablet Take 400 mg by mouth every 8 (eight) hours as needed (for pain.).   Yes [provider]  lisinopril  (ZESTRIL ) 40 MG tablet Take 0.5 tablets (20 mg total) by mouth daily. 11/11/23  Yes Gasper Nancyann BRAVO, MD  Multiple Vitamins-Minerals (MULTIVITAMIN PO) Take by mouth daily.   Yes [provider]  rosuvastatin  (CRESTOR ) 5 MG tablet Take 1 tablet (5 mg total) by mouth daily. 10/12/23  Yes [name], Jon HERO, MD  vitamin E  400 UNIT capsule Take 400 Units by mouth daily.   Yes [provider]  aspirin EC 81 MG tablet Take 81 mg by mouth daily. Patient not taking: Reported on 11/19/2023    [provider]  fluticasone  (FLONASE ) 50 MCG/ACT nasal spray Place 2 sprays into both nostrils daily. Patient not taking: Reported on 11/19/2023 02/21/21   [name], Angela M, MD  Garlic 10 MG CAPS Take 10 mg  by mouth daily.  Patient not taking: Reported on 11/19/2023    [provider]  hydrocortisone 2.5 % cream SMARTSIG:1 Topical Daily Patient not taking: Reported on 11/19/2023 09/13/21   [provider]  lidocaine  (LIDODERM ) 5 % Place 1 patch onto the skin every 12 (twelve) hours. Remove & Discard patch within 12 hours or as directed by MD Patient not taking: Reported on 11/19/2023 10/16/23 10/15/24  Willo Dunnings, MD  meloxicam  (MOBIC ) 15 MG tablet Take 15 mg by mouth daily. Patient not taking: No sig reported    [provider]  nystatin -triamcinolone  ointment (MYCOLOG) Apply 1 Application topically 2 (two) times daily. Patient not taking: Reported on 11/19/2023 09/23/22   Helon Kirsch A, PA-C  Omega-3 Fatty Acids (FISH OIL CONCENTRATE) 1000 MG CAPS Take 1,000 mg by mouth daily.  Patient not taking: Reported on 11/19/2023    [provider]  sildenafil  (VIAGRA ) 100 MG tablet Take 1 tablet (100 mg total) by mouth daily as needed for erectile dysfunction. Take two hours prior to intercourse on an empty stomach Patient not taking: Reported on 11/19/2023 09/13/19   Helon Kirsch A, PA-C  simvastatin  (ZOCOR ) 20 MG tablet TAKE 1 TABLET BY MOUTH AT BEDTIME Patient not taking: No sig reported 05/18/23   Myrla Jon HERO, MD  terbinafine  (LAMISIL ) 250 MG tablet Take 1 tablet (250 mg total) by mouth daily. Patient not taking: Reported on 11/19/2023 09/28/20   [name], Angela M, MD  triamcinolone  cream (KENALOG ) 0.1 % Apply 1 application topically 2 (two) times daily. Patient not taking: Reported on 11/19/2023 02/24/20   Alvia Corean CROME, FNP      I spent 61 minutes caring for this patient today, including preparing to see the patient, obtaining a medical history , reviewing a separately obtained history, performing a medically appropriate examination and/or evaluation, counseling and educating the patient/family/caregiver, ordering medications, tests, or  procedures, documenting clinical information in the electronic health record, independently interpreting results (not separately reported/billed) and communicating results to the patient/family/caregiver, and care coordination (not separately reported/billed)

## 2023-11-20 NOTE — Progress Notes (Signed)
 [facility]  ROUNDING NOTE   Subjective:   Patient well-known to us  from the office. He presented with acute kidney injury in the setting of known chronic kidney disease stage III yea with baseline EGFR 54. He also was found to have hypercalcemia. Patient was also complaining of significant back pain. Patient found to have right lower lobe nodule on CT chest. 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   Objective:  Vital signs in last 24 hours:  Temp:  [98 F (36.7 C)-98.3 F (36.8 C)] 98.1 F (36.7 C) (07/25 1531) Pulse Rate:  [86-101] 86 (07/25 1531) Resp:  [17-18] 18 (07/25 1531) BP: (100-162)/(57-79) 100/57 (07/25 1531) SpO2:  [91 %-95 %] 91 % (07/25 1531)  Weight change:  Filed Weights   11/19/23 0953  Weight: 94.8 kg    Intake/Output: I/O last 3 completed shifts: In: 1944.9 [I.V.:1944.9] Out: -    Intake/Output this shift:  Total I/O In: 720 [P.O.:720] Out: -   Physical Exam: General: No acute distress  Head: Normocephalic, atraumatic. Moist oral mucosal membranes  Neck: Supple  Lungs:  Clear to auscultation, normal effort  Heart: S1S2 no rubs  Abdomen:  Soft, nontender, bowel sounds present  Extremities: No peripheral edema.  Neurologic: Awake, alert, following commands  Skin: No acute rash  Access: No hemodialysis access    Basic Metabolic Panel: Recent Labs  Lab 11/19/23 0957 11/20/23 0511  NA 133* 137  K 4.1 4.2  CL 97* 101  CO2 22 25  GLUCOSE 114* 90  BUN 18 16  CREATININE 1.46* 1.30*  CALCIUM  10.8* 9.8    Liver Function Tests: Recent Labs  Lab 11/19/23 0957 11/19/23 1347  AST 39  --   ALT 29  --   ALKPHOS 173*  --   BILITOT 2.6* 2.5*  PROT 7.3  --   ALBUMIN 3.6  --    No results for input(s): LIPASE, AMYLASE in the last 168 hours. No results for input(s): AMMONIA in the last 168 hours.  CBC: Recent Labs  Lab 11/19/23 0957 11/20/23 0511  WBC 8.5 7.8  HGB 10.5* 8.9*  HCT 31.2* 27.0*  MCV  96.0 95.7  PLT 410* 340    Cardiac Enzymes: Recent Labs  Lab 11/19/23 0957  CKTOTAL 23*    BNP: Invalid input(s): POCBNP  CBG: No results for input(s): GLUCAP in the last 168 hours.  Microbiology: Results for orders placed or performed in visit on 09/13/19  Microscopic Examination     Status: None   Collection Time: 09/13/19  9:00 AM   URINE  Result Value Ref Range Status   WBC, UA 0-5 0 - 5 /hpf Final   RBC, Urine None seen 0 - 2 /hpf Final   Epithelial Cells (non renal) None seen 0 - 10 /hpf Final   Bacteria, UA None seen None seen/Few Final    Coagulation Studies: No results for input(s): LABPROT, INR in the last 72 hours.  Urinalysis: Recent Labs    11/19/23 1728  COLORURINE YELLOW*  LABSPEC 1.016  PHURINE 5.0  GLUCOSEU NEGATIVE  HGBUR NEGATIVE  BILIRUBINUR NEGATIVE  KETONESUR 20*  PROTEINUR NEGATIVE  NITRITE NEGATIVE  LEUKOCYTESUR NEGATIVE      Imaging: MR Lumbar Spine W Wo Contrast Result Date: 11/19/2023 CLINICAL DATA:  Low back pain, cancer suspected EXAM: MRI LUMBAR SPINE WITHOUT AND WITH CONTRAST TECHNIQUE: Multiplanar and multiecho pulse sequences of the lumbar spine were obtained without and with intravenous contrast. CONTRAST:  9mL GADAVIST  GADOBUTROL  1  MMOL/ML IV SOLN COMPARISON:  None Available. FINDINGS: Segmentation:  Standard. Alignment:  Physiologic. Vertebrae: T1 hypointense, stir hyperintense, and enhancing lesions at L2 and involving the partially imaged sacral vertebral bodies and sacrum, compatible with metastases. Conus medullaris and cauda equina: Conus extends to the L1 level. Conus and cauda equina appear normal. Paraspinal and other soft tissues: Partially imaged suspected extraosseous extension of tumor involving the pelvis on the left and possibly the right. Disc levels: T12-L1: No significant disc protrusion, foraminal stenosis, or canal stenosis. L1-L2: No significant disc protrusion, foraminal stenosis, or canal stenosis.  L2-L3: No significant disc protrusion, foraminal stenosis, or canal stenosis. L3-L4: Mild facet arthropathy.  No significant stenosis. L4-L5: Moderate facet arthropathy and mild disc bulging. Mild left foraminal stenosis. Patent canal. L5-S1: Moderate bilateral facet arthropathy and mild disc bulging. Mild right foraminal stenosis. Pain canal. IMPRESSION: 1. 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 2. Partially imaged suspected extraosseous extension of tumor involving the pelvis on the left and potentially the right. Recommend MRI of the pelvis with contrast for further evaluation. Electronically Signed   By: Gilmore GORMAN Molt M.D.   On: 11/19/2023 23:53   CT CHEST WO CONTRAST Result Date: 11/19/2023 CLINICAL DATA:  Generalized weakness. Dyspnea. Concern for lung cancer. * Tracking Code: BO * EXAM: CT CHEST WITHOUT CONTRAST TECHNIQUE: Multidetector CT imaging of the chest was performed following the standard protocol without IV contrast. RADIATION DOSE REDUCTION: This exam was performed according to the departmental dose-optimization program which includes automated exposure control, adjustment of the mA and/or kV according to patient size and/or use of iterative reconstruction technique. COMPARISON:  Plain film of earlier today.  No prior CT FINDINGS: Cardiovascular: Aortic atherosclerosis. Tortuous thoracic aorta. Normal heart size, without pericardial effusion. Left main and 3 vessel coronary artery calcification. Mediastinum/Nodes: No supraclavicular adenopathy. Subcarinal nodal mass measures 1.6 x 3.2 cm on 69/3. Right hilar nodal mass is poorly delineated secondary to noncontrast technique. Estimated at 3.2 x 2.8 cm on 68/3, causing endobronchial compression as detailed below. Lungs/Pleura: No pleural fluid. Endobronchial marked narrowing, near obstruction involving the bronchus intermedius including on 70/4. More mild narrowing of the proximal  right middle and right lower lobe bronchi. Minimal exclusion of the inferior most right lung base. 1.1 x 1.3 cm soft tissue density within the anterior most right lower lobe is favored to be pulmonary parenchymal nodule over an infrahilar node. Example on image 68/4 Scattered calcified granulomas. Upper Abdomen: Nonspecific caudate lobe enlargement. Cholecystectomy. Normal imaged portions of the spleen, stomach, pancreas, adrenal glands, left kidney. Musculoskeletal: Mid and lower thoracic spondylosis. IMPRESSION: 1. 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 The dominant right hilar nodal mass causes narrowing of the bronchus intermedius. Recommend multidisciplinary thoracic oncology consultation for eventual PET and tissue sampling. Alternatively, sampling via bronchoscopy could be performed. 2. 1.3 cm soft tissue density along the anterior most right lower lobe is favored to represent a parenchymal pulmonary nodule and is most likely the site of primary. An enlarged infrahilar node could look similar. 3. Incidental findings, including: Coronary artery atherosclerosis. Aortic Atherosclerosis (ICD10-I70.0). Electronically Signed   By: Rockey Kilts M.D.   On: 11/19/2023 12:44   DG Chest 2 View Result Date: 11/19/2023 CLINICAL DATA:  weakness EXAM: CHEST - 2 VIEW COMPARISON:  October 16, 2023 FINDINGS: No focal airspace consolidation, pleural effusion, or pneumothorax. No cardiomegaly. Tortuous aorta with aortic atherosclerosis. No acute fracture  or destructive lesions. Multilevel thoracic osteophytosis. IMPRESSION: No acute cardiopulmonary abnormality. Electronically Signed   By: Rogelia Myers M.D.   On: 11/19/2023 11:29     Medications:     cyanocobalamin   1,000 mcg Oral Q0600   cyclobenzaprine   10 mg Oral TID   docusate sodium   100 mg Oral BID   fluticasone   2 spray Each Nare Daily   heparin   5,000 Units  Subcutaneous Q8H   lisinopril   20 mg Oral Daily   rosuvastatin   5 mg Oral QHS   vitamin E   400 Units Oral Daily   acetaminophen  **OR** acetaminophen , albuterol , morphine  injection, ondansetron  **OR** ondansetron  (ZOFRAN ) IV  Assessment/ Plan:  78 y.o. male with past medical history of hypertension, history of hypokalemia, hyponatremia, hyperlipidemia, rectal dysfunction who was recently seen in office for acute kidney injury, chronic kidney disease stage IIIa baseline EGFR 54, hypercalcemia, anemia of chronic kidney disease, and diffuse skeletal pain.  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  1.  Acute kidney injury/chronic kidney disease stage IIIa.  It appears that his renal function is significantly improving.  Creatinine down to 1.3.  Continue to monitor renal parameters daily for now.  2.  Hypercalcemia.  Hypercalcemia significantly improved and calcium  now down to 9.8.  Status post Zometa  infusion.  3.  Right hilar mass/mediastinal adenopathy concerning for malignancy.  Appreciate hematology input.   LOS: 1 Wilbert Hayashi 7/25/20255:46 PM

## 2023-11-20 NOTE — Telephone Encounter (Signed)
 Please schedule the following:  Provider performing procedure:Akeel Reffner Diagnosis: lung mass Which side if for nodule / mass? right Procedure: EBUS  Has patient been spoken to by Provider and given informed consent? yes Anesthesia: general Do you need Fluro? no Duration of procedure: 1 hour Date: 11/24/2023 Location: [facility] Does patient have OSA? no DM? no Or Latex allergy? no Medication Restriction/ Anticoagulate/Antiplatelet: no Pre-op Labs Ordered:determined by Anesthesia Imaging request: no  (If, SuperDimension CT Chest, please have STAT courier sent to ENDO)

## 2023-11-20 NOTE — Plan of Care (Signed)

## 2023-11-20 NOTE — Progress Notes (Signed)
 Ruben Garcia   DOB:[birth year]   FM#:[id number]    Subjective: Patient resting in the bed.  Accompanied by his wife.  Continues to complain of pain in his back.  Denies any bladder or bowel incontinence.  Denies any chest pain or shortness of breath.  Objective:  Vitals:   11/19/23 2023 11/20/23 0913  BP: 116/75 (!) 162/79  Pulse: 89 (!) 101  Resp: 17 18  Temp: 98.3 F (36.8 C) 98 F (36.7 C)  SpO2: 95% 93%     Intake/Output Summary (Last 24 hours) at 11/20/2023 1256 Last data filed at 11/20/2023 0900 Gross per 24 hour  Intake 2184.93 ml  Output --  Net 2184.93 ml    Physical Exam Vitals and nursing note reviewed.  HENT:     Head: Normocephalic and atraumatic.     Mouth/Throat:     Pharynx: Oropharynx is clear.  Eyes:     Extraocular Movements: Extraocular movements intact.     Pupils: Pupils are equal, round, and reactive to light.  Cardiovascular:     Rate and Rhythm: Normal rate and regular rhythm.  Pulmonary:     Comments: Decreased breath sounds bilaterally.  Abdominal:     Palpations: Abdomen is soft.  Musculoskeletal:        General: Normal range of motion.     Cervical back: Normal range of motion.  Skin:    General: Skin is warm.  Neurological:     General: No focal deficit present.     Mental Status: He is alert and oriented to person, place, and time.  Psychiatric:        Behavior: Behavior normal.        Judgment: Judgment normal.      Labs:  Lab Results  Component Value Date   WBC 7.8 11/20/2023   HGB 8.9 (L) 11/20/2023   HCT 27.0 (L) 11/20/2023   MCV 95.7 11/20/2023   PLT 340 11/20/2023   NEUTROABS 8.0 (H) 10/16/2023    Lab Results  Component Value Date   NA 137 11/20/2023   K 4.2 11/20/2023   CL 101 11/20/2023   CO2 25 11/20/2023    Studies:  MR Lumbar Spine W Wo Contrast Result Date: 11/19/2023 CLINICAL DATA:  Low back pain, cancer suspected EXAM: MRI LUMBAR SPINE WITHOUT AND WITH CONTRAST TECHNIQUE: Multiplanar and multiecho  pulse sequences of the lumbar spine were obtained without and with intravenous contrast. CONTRAST:  9mL GADAVIST  GADOBUTROL  1 MMOL/ML IV SOLN COMPARISON:  None Available. FINDINGS: Segmentation:  Standard. Alignment:  Physiologic. Vertebrae: T1 hypointense, stir hyperintense, and enhancing lesions at L2 and involving the partially imaged sacral vertebral bodies and sacrum, compatible with metastases. Conus medullaris and cauda equina: Conus extends to the L1 level. Conus and cauda equina appear normal. Paraspinal and other soft tissues: Partially imaged suspected extraosseous extension of tumor involving the pelvis on the left and possibly the right. Disc levels: T12-L1: No significant disc protrusion, foraminal stenosis, or canal stenosis. L1-L2: No significant disc protrusion, foraminal stenosis, or canal stenosis. L2-L3: No significant disc protrusion, foraminal stenosis, or canal stenosis. L3-L4: Mild facet arthropathy.  No significant stenosis. L4-L5: Moderate facet arthropathy and mild disc bulging. Mild left foraminal stenosis. Patent canal. L5-S1: Moderate bilateral facet arthropathy and mild disc bulging. Mild right foraminal stenosis. Pain canal. IMPRESSION: 1. 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 2. Partially imaged suspected extraosseous extension of tumor involving the pelvis on the left and potentially the  right. Recommend MRI of the pelvis with contrast for further evaluation. Electronically Signed   By: Gilmore GORMAN Molt M.D.   On: 11/19/2023 23:53   CT CHEST WO CONTRAST Result Date: 11/19/2023 CLINICAL DATA:  Generalized weakness. Dyspnea. Concern for lung cancer. * Tracking Code: BO * EXAM: CT CHEST WITHOUT CONTRAST TECHNIQUE: Multidetector CT imaging of the chest was performed following the standard protocol without IV contrast. RADIATION DOSE REDUCTION: This exam was performed according to the departmental dose-optimization  program which includes automated exposure control, adjustment of the mA and/or kV according to patient size and/or use of iterative reconstruction technique. COMPARISON:  Plain film of earlier today.  No prior CT FINDINGS: Cardiovascular: Aortic atherosclerosis. Tortuous thoracic aorta. Normal heart size, without pericardial effusion. Left main and 3 vessel coronary artery calcification. Mediastinum/Nodes: No supraclavicular adenopathy. Subcarinal nodal mass measures 1.6 x 3.2 cm on 69/3. Right hilar nodal mass is poorly delineated secondary to noncontrast technique. Estimated at 3.2 x 2.8 cm on 68/3, causing endobronchial compression as detailed below. Lungs/Pleura: No pleural fluid. Endobronchial marked narrowing, near obstruction involving the bronchus intermedius including on 70/4. More mild narrowing of the proximal right middle and right lower lobe bronchi. Minimal exclusion of the inferior most right lung base. 1.1 x 1.3 cm soft tissue density within the anterior most right lower lobe is favored to be pulmonary parenchymal nodule over an infrahilar node. Example on image 68/4 Scattered calcified granulomas. Upper Abdomen: Nonspecific caudate lobe enlargement. Cholecystectomy. Normal imaged portions of the spleen, stomach, pancreas, adrenal glands, left kidney. Musculoskeletal: Mid and lower thoracic spondylosis. IMPRESSION: 1. 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 The dominant right hilar nodal mass causes narrowing of the bronchus intermedius. Recommend multidisciplinary thoracic oncology consultation for eventual PET and tissue sampling. Alternatively, sampling via bronchoscopy could be performed. 2. 1.3 cm soft tissue density along the anterior most right lower lobe is favored to represent a parenchymal pulmonary nodule and is most likely the site of primary. An enlarged infrahilar node could look similar.  3. Incidental findings, including: Coronary artery atherosclerosis. Aortic Atherosclerosis (ICD10-I70.0). Electronically Signed   By: Rockey Kilts M.D.   On: 11/19/2023 12:44   DG Chest 2 View Result Date: 11/19/2023 CLINICAL DATA:  weakness EXAM: CHEST - 2 VIEW COMPARISON:  October 16, 2023 FINDINGS: No focal airspace consolidation, pleural effusion, or pneumothorax. No cardiomegaly. Tortuous aorta with aortic atherosclerosis. No acute fracture or destructive lesions. Multilevel thoracic osteophytosis. IMPRESSION: No acute cardiopulmonary abnormality. Electronically Signed   By: Rogelia Myers M.D.   On: 11/19/2023 20:55    # 78 year old male patient non-smoker and no prior history of malignancy is currently admitted to hospital for hypercalcemia/renal insufficiency-worsening back pain and also imaging findings concerning for malignancy.   # Right hilar mass/mediastinal adenopathy highly concerning for malignancy-  Recommend pulmonary evaluation-for biopsy planning.  Discussed with Dr. Hardy-   # Lower back pain-concerning for malignancy involving the back.  MRI lumbar spine shows-L2 lesion partially imaged sacrum concerning for extraosseous metastases involving the pelvis bilaterally.  Recommend MRI of the pelvis for further evaluation.  Patient pain currently stable on narcotics.   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  # I reviewed the findings of the imaging-concern for advanced malignancy with the patient and wife in detail.  There is a further workup patient will need-chemotherapy/treatment for the underlying presumed malignancy.  Offered  to speak to family-they declined. Discussed with Dr.Patel.   Cindy JONELLE Joe, MD 11/20/2023  12:56 PM

## 2023-11-21 ENCOUNTER — Inpatient Hospital Stay

## 2023-11-21 DIAGNOSIS — C7951 Secondary malignant neoplasm of bone: Secondary | ICD-10-CM

## 2023-11-21 DIAGNOSIS — D638 Anemia in other chronic diseases classified elsewhere: Secondary | ICD-10-CM | POA: Insufficient documentation

## 2023-11-21 DIAGNOSIS — G893 Neoplasm related pain (acute) (chronic): Secondary | ICD-10-CM

## 2023-11-21 DIAGNOSIS — R918 Other nonspecific abnormal finding of lung field: Secondary | ICD-10-CM

## 2023-11-21 DIAGNOSIS — R110 Nausea: Secondary | ICD-10-CM

## 2023-11-21 DIAGNOSIS — R7989 Other specified abnormal findings of blood chemistry: Secondary | ICD-10-CM | POA: Insufficient documentation

## 2023-11-21 DIAGNOSIS — E7800 Pure hypercholesterolemia, unspecified: Secondary | ICD-10-CM

## 2023-11-21 DIAGNOSIS — E663 Overweight: Secondary | ICD-10-CM

## 2023-11-21 LAB — COMPREHENSIVE METABOLIC PANEL WITH GFR
ALT: 105 U/L — ABNORMAL HIGH (ref 0–44)
AST: 70 U/L — ABNORMAL HIGH (ref 15–41)
Albumin: 3.1 g/dL — ABNORMAL LOW (ref 3.5–5.0)
Alkaline Phosphatase: 222 U/L — ABNORMAL HIGH (ref 38–126)
Anion gap: 9 (ref 5–15)
BUN: 20 mg/dL (ref 8–23)
CO2: 25 mmol/L (ref 22–32)
Calcium: 10.4 mg/dL — ABNORMAL HIGH (ref 8.9–10.3)
Chloride: 100 mmol/L (ref 98–111)
Creatinine, Ser: 1.12 mg/dL (ref 0.61–1.24)
GFR, Estimated: >60 mL/min (ref >60)
Glucose, Bld: 100 mg/dL — ABNORMAL HIGH (ref 70–99)
Potassium: 4 mmol/L (ref 3.5–5.1)
Sodium: 134 mmol/L — ABNORMAL LOW (ref 135–145)
Total Bilirubin: 1.3 mg/dL — ABNORMAL HIGH (ref 0.0–1.2)
Total Protein: 6.3 g/dL — ABNORMAL LOW (ref 6.5–8.1)

## 2023-11-21 MED ORDER — OXYCODONE HCL 5 MG PO TABS: 5.0000 mg | ORAL_TABLET | Freq: Four times a day (QID) | ORAL | 0 refills | Status: AC | PRN

## 2023-11-21 MED ORDER — OXYCODONE HCL 5 MG PO TABS
5.0000 mg | ORAL_TABLET | Freq: Four times a day (QID) | ORAL | Status: DC | PRN
  Administered 2023-11-21: 5 mg via ORAL
  Filled 2023-11-21: qty 1

## 2023-11-21 MED ORDER — POLYETHYLENE GLYCOL 3350 17 G PO PACK: 17.0000 g | PACK | Freq: Every day | ORAL | 0 refills | Status: DC | PRN

## 2023-11-21 MED ORDER — ONDANSETRON HCL 4 MG PO TABS: 4.0000 mg | ORAL_TABLET | Freq: Three times a day (TID) | ORAL | 0 refills | Status: AC | PRN

## 2023-11-21 MED ORDER — GADOBUTROL 1 MMOL/ML IV SOLN
9.0000 mL | Freq: Once | INTRAVENOUS | Status: AC | PRN
  Administered 2023-11-21: 9 mL via INTRAVENOUS

## 2023-11-21 MED ORDER — ACETAMINOPHEN 325 MG PO TABS: 650.0000 mg | ORAL_TABLET | Freq: Four times a day (QID) | ORAL | Status: AC | PRN

## 2023-11-21 MED ORDER — MORPHINE SULFATE (PF) 2 MG/ML IV SOLN: 2.0000 mg | INTRAVENOUS | Status: DC | PRN

## 2023-11-21 NOTE — Assessment & Plan Note (Signed)
 Continue Crestor  for now but continue to monitor as outpatient.

## 2023-11-21 NOTE — Assessment & Plan Note (Signed)
 1.3 cm soft tissue density along the anteriormost right lower lobe, thoracic and right hilar infiltrative adenopathy. Patient to have a bronchoscopy on Tuesday as outpatient.

## 2023-11-21 NOTE — Assessment & Plan Note (Signed)
 Seen on L2 and also on bones in the MRI of the pelvis.  Dr. Rennie to speak with interventional radiology on Monday to see if there is enough tissue there for biopsy otherwise will need a bronchoscopy for tissue diagnosis.

## 2023-11-21 NOTE — Plan of Care (Signed)
 Pt discharged to home  Problem: Education: Goal: Knowledge of General Education information will improve Description: Including pain rating scale, medication(s)/side effects and non-pharmacologic comfort measures Outcome: Adequate for Discharge   Problem: Health Behavior/Discharge Planning: Goal: Ability to manage health-related needs will improve Outcome: Adequate for Discharge   Problem: Clinical Measurements: Goal: Ability to maintain clinical measurements within normal limits will improve Outcome: Adequate for Discharge Goal: Will remain free from infection Outcome: Adequate for Discharge Goal: Diagnostic test results will improve Outcome: Adequate for Discharge Goal: Respiratory complications will improve Outcome: Adequate for Discharge Goal: Cardiovascular complication will be avoided Outcome: Adequate for Discharge   Problem: Activity: Goal: Risk for activity intolerance will decrease Outcome: Adequate for Discharge   Problem: Nutrition: Goal: Adequate nutrition will be maintained Outcome: Adequate for Discharge   Problem: Coping: Goal: Level of anxiety will decrease Outcome: Adequate for Discharge   Problem: Elimination: Goal: Will not experience complications related to bowel motility Outcome: Adequate for Discharge Goal: Will not experience complications related to urinary retention Outcome: Adequate for Discharge   Problem: Pain Managment: Goal: General experience of comfort will improve and/or be controlled Outcome: Adequate for Discharge   Problem: Safety: Goal: Ability to remain free from injury will improve Outcome: Adequate for Discharge   Problem: Skin Integrity: Goal: Risk for impaired skin integrity will decrease Outcome: Adequate for Discharge

## 2023-11-21 NOTE — Discharge Summary (Signed)
 Physician Discharge Summary   Patient: Ruben Garcia MRN: [id number] DOB: May 04, 1945  Admit date:     11/19/2023  Discharge date: 11/21/23  Discharge Physician: Charlie Patterson   PCP: Myrla Jon HERO, MD   Recommendations at discharge:   Follow-up PCP 5 days Bronchoscopy set up for Tuesday with Dr. Isadora Follow-up Dr. 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  Vitals in the ED showed T of 98.3, respiration of 18, heart rate 125, blood pressure 126/81, SpO2 99% on room air.  Serum sodium is 133, potassium 4.1, chloride 97, bicarb 22, BUN 18, serum creatinine 1.46, EGFR 50, nonfasting blood glucose 114, WBC 8.5, hemoglobin 10.5, platelets of 410.  CK is 23.  HS troponin is 9.  ED treatment: Morphine  4 mg IV one-time dose, ondansetron  4 mg IV one-time dose, sodium chloride  500 mL liter bolus.  Assessment and Plan: * Inadequate pain control Suspect neoplastic related pain Patient was only using morphine  here in the hospital.  Switched over to oxycodone  and tolerated it without nausea.  Will prescribe oxycodone  5 mg every 6 hours as needed for severe pain.  5-day supply of pain medications prescribed.  Needs to get refill from primary care physician or oncology.  Can use Tylenol  for mild pain.  Lung mass 1.3 cm soft  tissue density along the anteriormost right lower lobe, thoracic and right hilar infiltrative adenopathy. Patient to have a bronchoscopy on Tuesday as outpatient.   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  Dr. Rennie to speak with interventional radiology on Monday to see if there is enough tissue there for biopsy otherwise will need a bronchoscopy for tissue diagnosis.  Hypercalcemia Calcium  10.4 upon discharge.  Received Zometa .  Elevated liver function tests Continue Crestor  for now but continue to monitor as outpatient.  Anemia of chronic disease Hemoglobin 8.9.  Follow-up closely as outpatient.  Overweight (BMI 25.0-29.9) BMI 28.35 with current height and weight in computer.  Nausea As needed Zofran  prescribed  Pure hypercholesterolemia Continue Crestor   Essential (primary) hypertension Continue lisinopril          Consultants: Pulmonary, oncology Procedures performed: None Disposition: Home Diet recommendation:  Cardiac diet DISCHARGE MEDICATION: Allergies as of 11/21/2023       Reactions   Codeine Nausea Only        Medication List     STOP taking these medications    ibuprofen  200 MG tablet Commonly known as: ADVIL        TAKE these medications    acetaminophen  325 MG tablet Commonly known as: TYLENOL  Take 2 tablets (650 mg total) by mouth every 6 (six) hours as needed for mild pain (pain score 1-3), fever, headache or moderate pain (pain score 4-6) (or Fever >/= 101).   albuterol  108 (90  Base) MCG/ACT inhaler Commonly known as: VENTOLIN  HFA Inhale 1-2 puffs into the lungs every 6 (six) hours as needed.   cyclobenzaprine  10 MG tablet Commonly known as: FLEXERIL  Take 1 tablet (10 mg total) by mouth 3 (three) times daily as needed for muscle spasms.   lisinopril  40 MG tablet Commonly known as: ZESTRIL  Take 0.5 tablets (20 mg total) by mouth daily.   MULTIVITAMIN PO Take by mouth daily.    ondansetron  4 MG tablet Commonly known as: Zofran  Take 1 tablet (4 mg total) by mouth every 8 (eight) hours as needed for nausea or vomiting.   oxyCODONE  5 MG immediate release tablet Commonly known as: Oxy IR/ROXICODONE  Take 1 tablet (5 mg total) by mouth every 6 (six) hours as needed for up to 5 days for severe pain (pain score 7-10).   polyethylene glycol 17 g packet Commonly known as: MiraLax  Take 17 g by mouth daily as needed.   rosuvastatin  5 MG tablet Commonly known as: Crestor  Take 1 tablet (5 mg total) by mouth daily.   Vitamin B 12 500 MCG Tabs Take 100 mg by mouth daily at 6 (six) AM.   vitamin E  180 MG (400 UNITS) capsule Take 400 Units by mouth daily.        Follow-up Information     Myrla Jon HERO, MD Follow up in 5 day(s).   Specialty: Family Medicine Why: Call to schedule follow up appointment. Contact information: [address] [place] [address] [phone]         Isadora Hose, MD Follow up.   Specialty: Pulmonary Disease Why: come to hospital at 12noon on 7/29 for bronchoscopy Contact information: [address] [place] [address] [phone]         [name], MD Follow up in 1 week(s).   Specialties: Internal Medicine, Oncology Why: Call to schedule follow up appointment. Contact information: [address] [place] [address] [phone]                Discharge Exam: Fredricka Weights   11/19/23 0953  Weight: 94.8 kg   Physical Exam HENT:     Head: Normocephalic.  Eyes:     General: Lids are normal.     Conjunctiva/sclera: Conjunctivae normal.  Cardiovascular:     Rate and Rhythm: Normal rate and regular rhythm.     Heart sounds: Normal heart sounds, S1 normal and S2 normal.  Pulmonary:     Breath sounds: No decreased breath sounds, wheezing, rhonchi or rales.  Abdominal:     Palpations: Abdomen is soft.     Tenderness: There is no abdominal  tenderness.  Musculoskeletal:     Right lower leg: No swelling.     Left lower leg: No swelling.  Skin:    General: Skin is warm.     Findings: No rash.  Neurological:     Mental Status: He is alert and oriented to person, place, and time.      Condition at discharge: fair  The results of significant diagnostics from this hospitalization (including imaging, microbiology, ancillary and laboratory) are listed below for reference.   Imaging Studies: MR PELVIS W WO CONTRAST Result Date: 11/21/2023 CLINICAL DATA:  Right hilar mass with osseous metastatic disease, including pelvic tumor EXAM: MRI PELVIS WITHOUT AND WITH CONTRAST TECHNIQUE: Multiplanar multisequence MR imaging of the pelvis was performed both before and after administration of intravenous contrast. CONTRAST:  9mL GADAVIST  GADOBUTROL  1 MMOL/ML IV SOLN COMPARISON:  Lumbar MRI 11/19/2023 FINDINGS: OSSEOUS STRUCTURES AND JOINTS: Substantial metastatic burden to the bilateral iliac bones, right ischium, bilateral posterior acetabular walls, left pubic bone,, central and right sacrum, bilateral femoral neck, and bilateral intertrochanteric region. In particular, tumor in the proximal femurs predisposes the patient to pathologic fracture and special cautions with weight-bearing activities is recommended. Regarding the dominant left iliac lesion, there is extensive extraosseous extension of tumor both anteriorly along the iliopsoas and upper pelvic sidewall; posteriorly along the gluteus minimus and gluteus medius, and even posteriorly along the lower paraspinal and medial left gluteus maximus musculature. A small amount of tumor is present along the left sciatic notch but does not visibly impinge on the left sciatic nerve. Likewise there is extraosseous extension of tumor anteriorly associated with the right sacral lesion, partially tracking along the upper margin of the right piriformis muscle. Mild extraosseous extension of tumor anteriorly and  posteriorly from the right iliac lesion is shown on image 20 of series 14, tracking along the right iliopsoas and right gluteus minimus. There is tumor along the attachment site of the right rectus femoris muscle, which could predispose to avulsion. MUSCULOTENDINOUS: Muscular involvement by extraosseous extension of tumor as noted above. No separate purely muscular metastatic lesion identified. There is muscular edema involving portions of the bilateral iliopsoas musculature as well as left greater than right gluteal musculature and hip adductor musculature, likely related to the above described tumors. SACRAL PLEXUS: Tumor from the left iliac bone is tangential to components of the left sacral plexus example on image 19 of series 8 but does not demonstrably involve the sacral plexus at this time. OTHER: Scattered small pelvic lymph nodes, one of the largest is a 0.9 cm left iliac node on image 17 series 14. Mild presacral edema likely related to the sacral metastatic lesion. Mildly prominent median lobe of prostate gland indents the bladder base. Mildly fatty left spermatic cord. IMPRESSION: 1. Substantial metastatic burden to the bilateral iliac bones, right ischium, bilateral posterior acetabular walls, left pubic bone, central and right sacrum, bilateral femoral neck, and bilateral intertrochanteric region. In particular, tumor in the proximal femurs predisposes the patient to pathologic fracture and special cautions with weight-bearing activities is recommended. 2. Extraosseous extension of tumor from the dominant left iliac lesion, tracking along the left iliopsoas and upper pelvic sidewall; posteriorly along the gluteus minimus and gluteus medius, and even posteriorly along the lower paraspinal and medial left gluteus maximus musculature. 3. Extraosseous extension of tumor from the right iliac lesion, tracking along the right iliopsoas and right gluteus minimus. 4. Tumor along the attachment site of the right  rectus femoris muscle, which could predispose to avulsion. 5. Scattered small pelvic lymph nodes, one of the largest is a 0.9 cm left iliac node. 6. Mildly prominent median lobe of prostate gland indents the bladder base. 7. Mildly fatty left spermatic cord. Electronically Signed   By: Ryan Salvage M.D.   On: 11/21/2023 12:09   MR Lumbar Spine W Wo Contrast Result Date: 11/19/2023 CLINICAL DATA:  Low back pain, cancer suspected EXAM: MRI LUMBAR SPINE WITHOUT AND WITH CONTRAST TECHNIQUE: Multiplanar and multiecho pulse sequences of the lumbar spine were obtained without and with intravenous contrast. CONTRAST:  9mL GADAVIST  GADOBUTROL  1 MMOL/ML IV SOLN COMPARISON:  None Available. FINDINGS: Segmentation:  Standard. Alignment:  Physiologic. Vertebrae: T1 hypointense, stir hyperintense, and enhancing lesions at L2 and involving the partially imaged sacral vertebral bodies and sacrum, compatible with metastases. Conus medullaris and cauda equina: Conus extends  to the L1 level. Conus and cauda equina appear normal. Paraspinal and other soft tissues: Partially imaged suspected extraosseous extension of tumor involving the pelvis on the left and possibly the right. Disc levels: T12-L1: No significant disc protrusion, foraminal stenosis, or canal stenosis. L1-L2: No significant disc protrusion, foraminal stenosis, or canal stenosis. L2-L3: No significant disc protrusion, foraminal stenosis, or canal stenosis. L3-L4: Mild facet arthropathy.  No significant stenosis. L4-L5: Moderate facet arthropathy and mild disc bulging. Mild left foraminal stenosis. Patent canal. L5-S1: Moderate bilateral facet arthropathy and mild disc bulging. Mild right foraminal stenosis. Pain canal. IMPRESSION: 1. 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 2. Partially imaged suspected extraosseous extension of tumor involving the pelvis on the left and potentially the right. Recommend  MRI of the pelvis with contrast for further evaluation. Electronically Signed   By: Gilmore GORMAN Molt M.D.   On: 11/19/2023 23:53   CT CHEST WO CONTRAST Result Date: 11/19/2023 CLINICAL DATA:  Generalized weakness. Dyspnea. Concern for lung cancer. * Tracking Code: BO * EXAM: CT CHEST WITHOUT CONTRAST TECHNIQUE: Multidetector CT imaging of the chest was performed following the standard protocol without IV contrast. RADIATION DOSE REDUCTION: This exam was performed according to the departmental dose-optimization program which includes automated exposure control, adjustment of the mA and/or kV according to patient size and/or use of iterative reconstruction technique. COMPARISON:  Plain film of earlier today.  No prior CT FINDINGS: Cardiovascular: Aortic atherosclerosis. Tortuous thoracic aorta. Normal heart size, without pericardial effusion. Left main and 3 vessel coronary artery calcification. Mediastinum/Nodes: No supraclavicular adenopathy. Subcarinal nodal mass measures 1.6 x 3.2 cm on 69/3. Right hilar nodal mass is poorly delineated secondary to noncontrast technique. Estimated at 3.2 x 2.8 cm on 68/3, causing endobronchial compression as detailed below. Lungs/Pleura: No pleural fluid. Endobronchial marked narrowing, near obstruction involving the bronchus intermedius including on 70/4. More mild narrowing of the proximal right middle and right lower lobe bronchi. Minimal exclusion of the inferior most right lung base. 1.1 x 1.3 cm soft tissue density within the anterior most right lower lobe is favored to be pulmonary parenchymal nodule over an infrahilar node. Example on image 68/4 Scattered calcified granulomas. Upper Abdomen: Nonspecific caudate lobe enlargement. Cholecystectomy. Normal imaged portions of the spleen, stomach, pancreas, adrenal glands, left kidney. Musculoskeletal: Mid and lower thoracic spondylosis. IMPRESSION: 1. 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 The dominant right hilar nodal mass causes narrowing of the bronchus intermedius. Recommend multidisciplinary thoracic oncology consultation for eventual PET and tissue sampling. Alternatively, sampling via bronchoscopy could be performed. 2. 1.3 cm soft tissue density along the anterior most right lower lobe is favored to represent a parenchymal pulmonary nodule and is most likely the site of primary. An enlarged infrahilar node could look similar. 3. Incidental findings, including: Coronary artery atherosclerosis. Aortic Atherosclerosis (ICD10-I70.0). Electronically Signed   By: Rockey Kilts M.D.   On: 11/19/2023 12:44   DG Chest 2 View Result Date: 11/19/2023 CLINICAL DATA:  weakness EXAM: CHEST - 2 VIEW COMPARISON:  October 16, 2023 FINDINGS: No focal airspace consolidation, pleural effusion, or pneumothorax. No cardiomegaly. Tortuous aorta with aortic atherosclerosis. No acute fracture or destructive lesions. Multilevel thoracic osteophytosis. IMPRESSION: No acute cardiopulmonary abnormality. Electronically Signed   By: Rogelia Myers M.D.   On: 11/19/2023 11:29   US  Abdomen Limited RUQ (LIVER/GB) Result Date: 11/04/2023 CLINICAL DATA:  Elevated bilirubin and alkaline phosphatase EXAM:  ULTRASOUND ABDOMEN LIMITED RIGHT UPPER QUADRANT COMPARISON:  None available FINDINGS: Gallbladder: Surgically absent Common bile duct: Diameter: 5 mm Liver: Parenchymal echogenicity: Diffusely increased Contours: Normal Lesions: None Portal vein: Patent.  Hepatopetal flow Other: None. IMPRESSION: Diffuse increased echogenicity of the hepatic parenchyma is a nonspecific indicator of hepatocellular dysfunction, most commonly steatosis. Electronically Signed   By: Aliene Lloyd M.D.   On: 11/04/2023 10:23    Microbiology: Results for orders placed or performed in visit on 09/13/19  Microscopic Examination     Status: None   Collection Time: 09/13/19  9:00 AM    URINE  Result Value Ref Range Status   WBC, UA 0-5 0 - 5 /hpf Final   RBC, Urine None seen 0 - 2 /hpf Final   Epithelial Cells (non renal) None seen 0 - 10 /hpf Final   Bacteria, UA None seen None seen/Few Final    Labs: CBC: Recent Labs  Lab 11/19/23 0957 11/20/23 0511  WBC 8.5 7.8  HGB 10.5* 8.9*  HCT 31.2* 27.0*  MCV 96.0 95.7  PLT 410* 340   Basic Metabolic Panel: Recent Labs  Lab 11/19/23 0957 11/20/23 0511 11/21/23 0244  NA 133* 137 134*  K 4.1 4.2 4.0  CL 97* 101 100  CO2 22 25 25   GLUCOSE 114* 90 100*  BUN 18 16 20   CREATININE 1.46* 1.30* 1.12  CALCIUM  10.8* 9.8 10.4*   Liver Function Tests: Recent Labs  Lab 11/19/23 0957 11/19/23 1347 11/21/23 0244  AST 39  --  70*  ALT 29  --  105*  ALKPHOS 173*  --  222*  BILITOT 2.6* 2.5* 1.3*  PROT 7.3  --  6.3*  ALBUMIN 3.6  --  3.1*   CBG: No results for input(s): GLUCAP in the last 168 hours.  Discharge time spent: greater than 30 minutes.  Signed: Charlie Patterson, MD [facility] 11/21/2023

## 2023-11-21 NOTE — Progress Notes (Signed)
 Pt discharged to home with family via wheelchair. Discharge instructions reviewed with patient and family, all questions and concerns answered. PIV x 1 removed.

## 2023-11-21 NOTE — Assessment & Plan Note (Signed)
 Calcium  10.4 upon discharge.  Received Zometa .

## 2023-11-21 NOTE — Progress Notes (Signed)
 [facility]  ROUNDING NOTE   Subjective:   Patient well-known to us  from the office. He presented with acute kidney injury in the setting of known chronic kidney disease stage III yea with baseline EGFR 54. He also was found to have hypercalcemia. Patient was also complaining of significant back pain. Patient found to have right lower lobe nodule on CT chest. 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  Update Patient seen laying in bed, wife at bedside Continues to have some discomfort Recent pelvic MRI, pending  Creatinine 1.12 Calcium  10.4   Objective:  Vital signs in last 24 hours:  Temp:  [97.6 F (36.4 C)-98.1 F (36.7 C)] 97.7 F (36.5 C) (07/26 0752) Pulse Rate:  [85-101] 94 (07/26 1159) Resp:  [16-19] 16 (07/26 0752) BP: (90-146)/(55-75) 146/75 (07/26 1159) SpO2:  [91 %-96 %] 94 % (07/26 0752)  Weight change:  Filed Weights   11/19/23 0953  Weight: 94.8 kg    Intake/Output: I/O last 3 completed shifts: In: 2904.9 [P.O.:960; I.V.:1944.9] Out: -    Intake/Output this shift:  Total I/O In: 240 [P.O.:240] Out: -   Physical Exam: General: No acute distress  Head: Normocephalic, atraumatic. Moist oral mucosal membranes  Neck: Supple  Lungs:  Clear to auscultation, normal effort  Heart: S1S2 no rubs  Abdomen:  Soft, nontender, bowel sounds present  Extremities: No peripheral edema.  Neurologic: Awake, alert, following commands  Skin: No acute rash  Access: No hemodialysis access    Basic Metabolic Panel: Recent Labs  Lab 11/19/23 0957 11/20/23 0511 11/21/23 0244  NA 133* 137 134*  K 4.1 4.2 4.0  CL 97* 101 100  CO2 22 25 25   GLUCOSE 114* 90 100*  BUN 18 16 20   CREATININE 1.46* 1.30* 1.12  CALCIUM  10.8* 9.8 10.4*    Liver Function Tests: Recent Labs  Lab 11/19/23 0957 11/19/23 1347 11/21/23 0244  AST 39  --  70*  ALT 29  --  105*  ALKPHOS 173*  --  222*  BILITOT 2.6* 2.5* 1.3*  PROT 7.3  --  6.3*  ALBUMIN 3.6   --  3.1*   No results for input(s): LIPASE, AMYLASE in the last 168 hours. No results for input(s): AMMONIA in the last 168 hours.  CBC: Recent Labs  Lab 11/19/23 0957 11/20/23 0511  WBC 8.5 7.8  HGB 10.5* 8.9*  HCT 31.2* 27.0*  MCV 96.0 95.7  PLT 410* 340    Cardiac Enzymes: Recent Labs  Lab 11/19/23 0957  CKTOTAL 23*    BNP: Invalid input(s): POCBNP  CBG: No results for input(s): GLUCAP in the last 168 hours.  Microbiology: Results for orders placed or performed in visit on 09/13/19  Microscopic Examination     Status: None   Collection Time: 09/13/19  9:00 AM   URINE  Result Value Ref Range Status   WBC, UA 0-5 0 - 5 /hpf Final   RBC, Urine None seen 0 - 2 /hpf Final   Epithelial Cells (non renal) None seen 0 - 10 /hpf Final   Bacteria, UA None seen None seen/Few Final    Coagulation Studies: No results for input(s): LABPROT, INR in the last 72 hours.  Urinalysis: Recent Labs    11/19/23 1728  COLORURINE YELLOW*  LABSPEC 1.016  PHURINE 5.0  GLUCOSEU NEGATIVE  HGBUR NEGATIVE  BILIRUBINUR NEGATIVE  KETONESUR 20*  PROTEINUR NEGATIVE  NITRITE NEGATIVE  LEUKOCYTESUR NEGATIVE      Imaging: MR PELVIS W WO CONTRAST  Result Date: 11/21/2023 CLINICAL DATA:  Right hilar mass with osseous metastatic disease, including pelvic tumor EXAM: MRI PELVIS WITHOUT AND WITH CONTRAST TECHNIQUE: Multiplanar multisequence MR imaging of the pelvis was performed both before and after administration of intravenous contrast. CONTRAST:  9mL GADAVIST  GADOBUTROL  1 MMOL/ML IV SOLN COMPARISON:  Lumbar MRI 11/19/2023 FINDINGS: OSSEOUS STRUCTURES AND JOINTS: Substantial metastatic burden to the bilateral iliac bones, right ischium, bilateral posterior acetabular walls, left pubic bone,, central and right sacrum, bilateral femoral neck, and bilateral intertrochanteric region. In particular, tumor in the proximal femurs predisposes the patient to pathologic fracture and  special cautions with weight-bearing activities is recommended. Regarding the dominant left iliac lesion, there is extensive extraosseous extension of tumor both anteriorly along the iliopsoas and upper pelvic sidewall; posteriorly along the gluteus minimus and gluteus medius, and even posteriorly along the lower paraspinal and medial left gluteus maximus musculature. A small amount of tumor is present along the left sciatic notch but does not visibly impinge on the left sciatic nerve. Likewise there is extraosseous extension of tumor anteriorly associated with the right sacral lesion, partially tracking along the upper margin of the right piriformis muscle. Mild extraosseous extension of tumor anteriorly and posteriorly from the right iliac lesion is shown on image 20 of series 14, tracking along the right iliopsoas and right gluteus minimus. There is tumor along the attachment site of the right rectus femoris muscle, which could predispose to avulsion. MUSCULOTENDINOUS: Muscular involvement by extraosseous extension of tumor as noted above. No separate purely muscular metastatic lesion identified. There is muscular edema involving portions of the bilateral iliopsoas musculature as well as left greater than right gluteal musculature and hip adductor musculature, likely related to the above described tumors. SACRAL PLEXUS: Tumor from the left iliac bone is tangential to components of the left sacral plexus example on image 19 of series 8 but does not demonstrably involve the sacral plexus at this time. OTHER: Scattered small pelvic lymph nodes, one of the largest is a 0.9 cm left iliac node on image 17 series 14. Mild presacral edema likely related to the sacral metastatic lesion. Mildly prominent median lobe of prostate gland indents the bladder base. Mildly fatty left spermatic cord. IMPRESSION: 1. Substantial metastatic burden to the bilateral iliac bones, right ischium, bilateral posterior acetabular walls, left  pubic bone, central and right sacrum, bilateral femoral neck, and bilateral intertrochanteric region. In particular, tumor in the proximal femurs predisposes the patient to pathologic fracture and special cautions with weight-bearing activities is recommended. 2. Extraosseous extension of tumor from the dominant left iliac lesion, tracking along the left iliopsoas and upper pelvic sidewall; posteriorly along the gluteus minimus and gluteus medius, and even posteriorly along the lower paraspinal and medial left gluteus maximus musculature. 3. Extraosseous extension of tumor from the right iliac lesion, tracking along the right iliopsoas and right gluteus minimus. 4. Tumor along the attachment site of the right rectus femoris muscle, which could predispose to avulsion. 5. Scattered small pelvic lymph nodes, one of the largest is a 0.9 cm left iliac node. 6. Mildly prominent median lobe of prostate gland indents the bladder base. 7. Mildly fatty left spermatic cord. Electronically Signed   By: Ryan Salvage M.D.   On: 11/21/2023 12:09   MR Lumbar Spine W Wo Contrast Result Date: 11/19/2023 CLINICAL DATA:  Low back pain, cancer suspected EXAM: MRI LUMBAR SPINE WITHOUT AND WITH CONTRAST TECHNIQUE: Multiplanar and multiecho pulse sequences of the lumbar spine were obtained without and with  intravenous contrast. CONTRAST:  9mL GADAVIST  GADOBUTROL  1 MMOL/ML IV SOLN COMPARISON:  None Available. FINDINGS: Segmentation:  Standard. Alignment:  Physiologic. Vertebrae: T1 hypointense, stir hyperintense, and enhancing lesions at L2 and involving the partially imaged sacral vertebral bodies and sacrum, compatible with metastases. Conus medullaris and cauda equina: Conus extends to the L1 level. Conus and cauda equina appear normal. Paraspinal and other soft tissues: Partially imaged suspected extraosseous extension of tumor involving the pelvis on the left and possibly the right. Disc levels: T12-L1: No significant disc  protrusion, foraminal stenosis, or canal stenosis. L1-L2: No significant disc protrusion, foraminal stenosis, or canal stenosis. L2-L3: No significant disc protrusion, foraminal stenosis, or canal stenosis. L3-L4: Mild facet arthropathy.  No significant stenosis. L4-L5: Moderate facet arthropathy and mild disc bulging. Mild left foraminal stenosis. Patent canal. L5-S1: Moderate bilateral facet arthropathy and mild disc bulging. Mild right foraminal stenosis. Pain canal. IMPRESSION: 1. 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 2. Partially imaged suspected extraosseous extension of tumor involving the pelvis on the left and potentially the right. Recommend MRI of the pelvis with contrast for further evaluation. Electronically Signed   By: Gilmore GORMAN Molt M.D.   On: 11/19/2023 23:53     Medications:     cyanocobalamin   1,000 mcg Oral Q0600   cyclobenzaprine   10 mg Oral TID   docusate sodium   100 mg Oral BID   fluticasone   2 spray Each Nare Daily   heparin   5,000 Units Subcutaneous Q8H   lisinopril   20 mg Oral Daily   rosuvastatin   5 mg Oral QHS   vitamin E   400 Units Oral Daily   acetaminophen  **OR** acetaminophen , albuterol , morphine  injection, ondansetron  **OR** ondansetron  (ZOFRAN ) IV, oxyCODONE   Assessment/ Plan:  78 y.o. male with past medical history of hypertension, history of hypokalemia, hyponatremia, hyperlipidemia, rectal dysfunction who was recently seen in office for acute kidney injury, chronic kidney disease stage IIIa baseline EGFR 54, hypercalcemia, anemia of chronic kidney disease, and diffuse skeletal pain.  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  1.  Acute kidney injury/chronic kidney disease stage IIIa.  Creatinine has returned to baseline.  2.  Hypercalcemia.  Hypercalcemia significantly improved.  Status post Zometa  infusion on 7/24.  S  calcium  10.4 today.  3.  Right hilar mass/mediastinal adenopathy concerning for malignancy.  Appreciate hematology input.   LOS: 2 Mayerli Kirst 7/26/202512:47 PM

## 2023-11-21 NOTE — Assessment & Plan Note (Signed)
 BMI 28.35 with current height and weight in computer.

## 2023-11-21 NOTE — Assessment & Plan Note (Signed)
 Hemoglobin 8.9.  Follow-up closely as outpatient.

## 2023-11-23 ENCOUNTER — Other Ambulatory Visit: Payer: Self-pay | Admitting: Radiology

## 2023-11-23 ENCOUNTER — Telehealth: Payer: Self-pay

## 2023-11-23 ENCOUNTER — Encounter: Payer: Self-pay | Admitting: *Deleted

## 2023-11-23 ENCOUNTER — Encounter: Payer: Self-pay | Admitting: Internal Medicine

## 2023-11-23 DIAGNOSIS — M7989 Other specified soft tissue disorders: Secondary | ICD-10-CM

## 2023-11-23 DIAGNOSIS — R918 Other nonspecific abnormal finding of lung field: Secondary | ICD-10-CM

## 2023-11-23 DIAGNOSIS — M6289 Other specified disorders of muscle: Secondary | ICD-10-CM

## 2023-11-23 LAB — CALCITRIOL (1,25 DI-OH VIT D): Vit D, 1,25-Dihydroxy: 198 pg/mL — ABNORMAL HIGH (ref 24.8–81.5)

## 2023-11-23 NOTE — Progress Notes (Signed)
 Spoke to patient regarding the biopsy-planned for tomorrow gluteal mass: Will follow-up later in the week to discuss results/treatment plan. GB

## 2023-11-23 NOTE — Progress Notes (Signed)
 Per Dr. Rennie, pt will need further workup with CT guided biopsy of the left psoas soft tissue mass. Pt informed of biopsy scheduled for tomorrow and that bronch with Dr. Isadora has been cancelled. Pt and family did not have any further questions at this time. Contact info given and instructed to call with any questions or needs. Pt and family verbalized understanding.

## 2023-11-23 NOTE — Progress Notes (Signed)
 Follow Up Visit   Patient Name: Ruben Garcia, male   Patient DOB: [id number] Date of Service: 11/23/2023  Patient MRN: [id number] Provider Creating Note: Bonnell Sherry, MD  [phone] Primary Care Physician:   [address] [place] [address] Additional Physicians/ Providers:    History of Present Illness Ruben Garcia is a 78 y.o. male who is following up today for chronic kidney disease stage II, hypercalcemia, lung mass, and diffuse skeletal pain.  At the last visit patient found to have acute kidney injury and chronic kidney disease in the setting of new acute hypercalcemia.  He is found to have hilar adenopathy as well as right sided lung mass.  He will be due for bronchoscopy tomorrow.  He was also found to have metastatic disease in his spine as well as his pelvis.  He is having significant pain at the moment.   Medications   Current Outpatient Medications:  .  ondansetron  (ZOFRAN ) 4 MG tablet, Take 4 mg by mouth, Disp: , Rfl:  .  oxyCODONE  (ROXICODONE ) 5 MG immediate release tablet, Take 5 mg by mouth, Disp: , Rfl:  .  albuterol  HFA (PROVENTIL  HFA;VENTOLIN  HFA) 108 (90 Base) MCG/ACT inhaler, Inhale 1-2 puffs, Disp: , Rfl:  .  alpha tocopherol (VITAMIN E ) 400 units capsule, Take 400 Units by mouth 1 (one) time each day, Disp: , Rfl:  .  aspirin (ST JOSEPH) 81 MG EC tablet, Take 81 mg by mouth 1 (one) time each day (Patient not taking: Reported on 11/17/2023), Disp: , Rfl:  .  cyanocobalamin  500 MCG tablet, Take 100 mg by mouth, Disp: , Rfl:  .  cyclobenzaprine  (FLEXERIL ) 10 MG tablet, Take 10 mg by mouth, Disp: , Rfl:  .  fluticasone  (FLONASE ) 50 MCG/ACT nasal spray, Administer 2 sprays into affected nostril(s), Disp: , Rfl:  .  GARLIC PO, Take 10 mg by mouth 1 (one) time each day, Disp: , Rfl:  .  ibuprofen  (ADVIL ,MOTRIN ) 200 MG tablet, Take 400 mg by mouth, Disp: , Rfl:  .  lidocaine  (LIDODERM ) 5 % patch, Place 1 patch on the skin, Disp: , Rfl:  .  lisinopril  20 MG  tablet, , Disp: , Rfl:  .  meloxicam  (MOBIC ) 15 MG tablet, Take 15 mg by mouth 1 (one) time each day, Disp: , Rfl:  .  nystatin -triamcinolone  (MYCOLOG II) ointment, Apply 1 Application topically, Disp: , Rfl:  .  omega-3 (FISH OIL) 1000 MG capsule, Take 1,000 mg by mouth 1 (one) time each day, Disp: , Rfl:  .  rosuvastatin  (CRESTOR ) 5 MG tablet, Take 5 mg by mouth 1 (one) time each day, Disp: , Rfl:  .  sildenafil  (VIAGRA ) 100 MG tablet, Take 100 mg by mouth, Disp: , Rfl:  .  simvastatin  (ZOCOR ) 20 MG tablet, , Disp: , Rfl:  .  terbinafine  (LamISIL ) 250 MG tablet, Take 250 mg by mouth, Disp: , Rfl:  .  triamcinolone  (KENALOG ) 0.1 % cream, Apply topically, Disp: , Rfl:    Allergies Codeine  Problem List There is no problem list on file for this patient.    Review of Systems  Constitutional:  Negative for chills and fever.  Respiratory:  Negative for cough and shortness of breath.   Cardiovascular:  Negative for chest pain and palpitations.  Gastrointestinal:  Negative for nausea and vomiting.  Genitourinary:  Negative for dysuria, hematuria and urgency.  Musculoskeletal:  Positive for back pain and joint pain.     History Past Medical History:  Diagnosis Date  .  Chronic kidney disease   . Essential hypertension     Past Surgical History:  Procedure Laterality Date  . CHOLECYSTECTOMY    . HEMORRHOID SURGERY     Family History  Problem Relation Age of Onset  . Dementia Mother   . Hypertension Mother   . Diabetes Mother   . Anemia Mother   . Hypertension Father   . Diabetes Father   . Cancer Father   . Hypertension Sister    Social History   Tobacco Use  . Smoking status: Never  . Smokeless tobacco: Not on file  Substance Use Topics  . Alcohol use: Not Currently        Physical Exam  Vitals BP 84/60 (BP Location: Right upper arm, Patient Position: Sitting)   Pulse (!) 116   Temp 98.1 F   Wt 203 lb (92.1 kg)   SpO2 92%   BMI 27.53 kg/m   PHYSICAL  EXAM: General appearance: well developed, well nourished, NAD Eyes: anicteric sclerae, moist conjunctivae; no lid-lag  HENT: Atraumatic; hearing intact Neck: Trachea midline; supple Lungs: CTAB, with normal respiratory effort  CV: S1S2, no murmurs or rubs. Abdomen: Soft, non-tender; bowel sounds present Extremities: No peripheral edema Skin: Warm and dry, normal skin turgor, no rashes noted. Psych: Appropriate affect, alert and oriented to person, place and time    Laboratory Studies   11/21/2023: Calcium  10.4, creatinine 1.12, EGFR greater than 60, alkaline phosphatase 222  Imaging and Other Studies     Orders Placed This Encounter  . Renal function panel  . CBC and differential  . Ambulatory referral to Pain Medicine       Impression/Recommendations  Ruben Garcia is a 78 y.o. male with past medical history of hypertension, history of hypokalemia, hyponatremia, hyperlipidemia, erectile dysfunction returns today in follow-up of chronic kidney disease stage II, hypercalcemia.  1.  Chronic kidney disease stage II.  It appears that the patient's renal function has improved significantly.  Creatinine down to 1.12 with EGFR greater than 60.  Repeat renal parameters today.  Suspect acute kidney injury previously was due to significant hypercalcemia.  2.  Hypercalcemia of malignancy.  Patient found to have hilar adenopathy as well as right lung mass.  He also appears to have metastatic disease in the spine as well as in the pelvis.  Patient was given Zometa .  Patient to follow-up with hematology/oncology soon.  3.  Hypercalcemia.  Serum calcium  10.4 recently.  He did receive Zometa .  Follow-up serum calcium  today.  4.  Lung mass/hilar adenopathy.  Patient due for bronchoscopy with probable biopsy tomorrow.  5.  Back and pelvic pain.  Patient with significant metastatic burden.  Refer the patient over to pain management urgently for further evaluation.   Return in about 4 weeks  (around 12/21/2023).   Munsoor Lateef, MD

## 2023-11-23 NOTE — Progress Notes (Signed)
 Patient for US  guided Core lateral gluteal extension biopsy on Tues 11/24/23, I called and spoke with the patient on the phone and gave pre-procedure instructions. Pt was made aware to be here at 10:30a, NPO after MN prior to procedure as well as driver post procedure/recovery/discharge. Pt stated understanding. Called 11/23/23

## 2023-11-23 NOTE — Transitions of Care (Post Inpatient/ED Visit) (Unsigned)
   11/23/2023  Name: Ruben Garcia MRN: [id number] DOB: 1946-01-22  Today's TOC FU Call Status: Today's TOC FU Call Status:: Unsuccessful Call (1st Attempt) Unsuccessful Call (1st Attempt) Date: 11/23/23  Attempted to reach the patient regarding the most recent Inpatient/ED visit.  Follow Up Plan: Additional outreach attempts will be made to reach the patient to complete the Transitions of Care (Post Inpatient/ED visit) call.   Signature Julian Lemmings, LPN [facility] Nurse Health Advisor Direct Dial [phone]

## 2023-11-24 ENCOUNTER — Ambulatory Visit: Admit: 2023-11-24 | Admitting: Student in an Organized Health Care Education/Training Program

## 2023-11-24 ENCOUNTER — Ambulatory Visit
Admission: RE | Admit: 2023-11-24 | Discharge: 2023-11-24 | Disposition: A | Discharge status: Home / Self Care | Source: Ambulatory Visit | Attending: Internal Medicine | Admitting: Internal Medicine

## 2023-11-24 ENCOUNTER — Other Ambulatory Visit: Payer: Self-pay | Admitting: Internal Medicine

## 2023-11-24 DIAGNOSIS — M7989 Other specified soft tissue disorders: Secondary | ICD-10-CM | POA: Insufficient documentation

## 2023-11-24 DIAGNOSIS — C8336 Diffuse large B-cell lymphoma, intrapelvic lymph nodes: Secondary | ICD-10-CM | POA: Insufficient documentation

## 2023-11-24 DIAGNOSIS — Z85828 Personal history of other malignant neoplasm of skin: Secondary | ICD-10-CM | POA: Insufficient documentation

## 2023-11-24 DIAGNOSIS — M6289 Other specified disorders of muscle: Secondary | ICD-10-CM

## 2023-11-24 DIAGNOSIS — I10 Essential (primary) hypertension: Secondary | ICD-10-CM | POA: Diagnosis not present

## 2023-11-24 DIAGNOSIS — R222 Localized swelling, mass and lump, trunk: Secondary | ICD-10-CM | POA: Insufficient documentation

## 2023-11-24 DIAGNOSIS — R918 Other nonspecific abnormal finding of lung field: Secondary | ICD-10-CM

## 2023-11-24 LAB — PROTIME-INR
INR: 1 (ref 0.8–1.2)
Prothrombin Time: 13.9 s (ref 11.4–15.2)

## 2023-11-24 LAB — CBC
HCT: 31.4 % — ABNORMAL LOW (ref 39.0–52.0)
Hemoglobin: 10.2 g/dL — ABNORMAL LOW (ref 13.0–17.0)
MCH: 31.8 pg (ref 26.0–34.0)
MCHC: 32.5 g/dL (ref 30.0–36.0)
MCV: 97.8 fL (ref 80.0–100.0)
Platelets: 366 K/uL (ref 150–400)
RBC: 3.21 MIL/uL — ABNORMAL LOW (ref 4.22–5.81)
RDW: 13.9 % (ref 11.5–15.5)
WBC: 10.3 K/uL (ref 4.0–10.5)
nRBC: 0 % (ref 0.0–0.2)

## 2023-11-24 SURGERY — BRONCHOSCOPY, WITH EBUS
Anesthesia: General | Laterality: Bilateral

## 2023-11-24 MED ORDER — SODIUM CHLORIDE 0.9 % IV SOLN
INTRAVENOUS | Status: DC
  Administered 2023-11-24: 500 mL via INTRAVENOUS

## 2023-11-24 MED ORDER — OXYCODONE HCL 5 MG PO TABS
ORAL_TABLET | ORAL | Status: AC
  Filled 2023-11-24: qty 1

## 2023-11-24 MED ORDER — MIDAZOLAM HCL 2 MG/2ML IJ SOLN
INTRAMUSCULAR | Status: DC
Start: 2023-11-24 — End: 2023-11-24
  Filled 2023-11-24: qty 4

## 2023-11-24 MED ORDER — OXYCODONE HCL 5 MG PO TABS
5.0000 mg | ORAL_TABLET | Freq: Once | ORAL | Status: AC
  Administered 2023-11-24: 5 mg via ORAL

## 2023-11-24 MED ORDER — MIDAZOLAM HCL 2 MG/2ML IJ SOLN
INTRAMUSCULAR | Status: AC | PRN
Start: 2023-11-24 — End: 2023-11-24
  Administered 2023-11-24: .5 mg via INTRAVENOUS
  Administered 2023-11-24: 1 mg via INTRAVENOUS
  Administered 2023-11-24: .5 mg via INTRAVENOUS

## 2023-11-24 MED ORDER — FENTANYL CITRATE (PF) 100 MCG/2ML IJ SOLN
INTRAMUSCULAR | Status: AC | PRN
  Administered 2023-11-24: 25 ug via INTRAVENOUS
  Administered 2023-11-24 (×2): 50 ug via INTRAVENOUS
  Administered 2023-11-24: 25 ug via INTRAVENOUS

## 2023-11-24 MED ORDER — FENTANYL CITRATE (PF) 100 MCG/2ML IJ SOLN
INTRAMUSCULAR | Status: AC
  Filled 2023-11-24: qty 4

## 2023-11-24 NOTE — Procedures (Signed)
 Interventional Radiology Procedure Note  Procedure: CT Guided Biopsy of left gluteal soft tissue mass  Complications: None  Estimated Blood Loss: < 10 mL  Findings: 18 G core biopsy of left gluteal soft tissue mass performed under CT guidance.  Three core samples obtained and sent to Pathology.  Marcey DASEN. Luverne, M.D Pager:  [phone]

## 2023-11-24 NOTE — Transitions of Care (Post Inpatient/ED Visit) (Unsigned)
   11/24/2023  Name: Ruben Garcia MRN: [id number] DOB: Apr 19, 1946  Today's TOC FU Call Status: Today's TOC FU Call Status:: Unsuccessful Call (2nd Attempt) Unsuccessful Call (1st Attempt) Date: 11/23/23 Unsuccessful Call (2nd Attempt) Date: 11/24/23  Attempted to reach the patient regarding the most recent Inpatient/ED visit.  Follow Up Plan: Additional outreach attempts will be made to reach the patient to complete the Transitions of Care (Post Inpatient/ED visit) call.   Signature Julian Lemmings, LPN [facility] Nurse Health Advisor Direct Dial [phone]

## 2023-11-24 NOTE — H&P (Signed)
 Chief Complaint: Gluteal mass; request for biopsy  Referring Provider(s): [name]   Supervising Physician: Luverne Aran  Patient Status: [facility] - Out-pt  History of Present Illness: Ruben Garcia is a 78 y.o. male with PMH significant for basal cell carcinoma, colon polyps, HTN. He was admitted to the hospital after presenting to the ER for  cough and shob x 3 months and weakness. Workup revealed hypercalcemia and hypoalbuminemia; mild renal sufficiency; hilar adenopathy as well as right sided lung mass. Low back and hip pain prompted MRI lumbar spine which revealed additional osseous malignancies including at L2 and pelvis There is extraosseous extension of tumor from the dominant left iliac lesion, tracking along the left iliopsoas and upper pelvic sidewall; posteriorly along the gluteus minimus and gluteus medius, and even posteriorly along the lower paraspinal and medial left gluteus maximus musculature. Patient was originally scheduled for bronchoscopy today, but this has since been cancelled with request for a L psoas vs left gluteal soft tissue mass biopsy be performed on aforementioned lesion.   Procedure planned with moderate sedation.   Confirms NPO since MN and ride/supervision available for 24 hours.  Patient admits to intermittent left hip pain for the past few months. States that he takes Oxy PRN which does help control his pain. Also admits to intermittent shortness of breath, worse with ambulation. Denies fever, chills, CP, sore throat, N/V, abd pain, blood in stool or urine, abnormal bruising, leg swelling, back pain.   Allergies Reviewed:  Codeine  Patient is Full Code  Past Medical History:  Diagnosis Date   Allergic rhinitis, cause unspecified    Basal cell carcinoma of skin, site unspecified 04/28/2010   Nasal; [place].   BPH (benign prostatic hypertrophy)    Colon polyps    Essential hypertension, benign    Hemorrhoids, internal     Incomplete bladder emptying    Other abnormal glucose    Over weight    Personal history of colonic polyps    Personal history of other genital system and obstetric disorders(V13.29)    Pure hypercholesterolemia    Unspecified disorder of kidney and ureter    Unspecified disorder of liver     Past Surgical History:  Procedure Laterality Date   BASAL CELL CARCINOMA EXCISION  2012   Facial        [place]   COLONOSCOPY  11/13/2010   single polyp; repeatin 5 years; [place]   COLONOSCOPY N/A 11/04/2021   Procedure: COLONOSCOPY;  Surgeon: Maryruth Ole DASEN, MD;  Location: [facility] ENDOSCOPY;  Service: Endoscopy;  Laterality: N/A;  REQUEST EARLY TIME   COLONOSCOPY WITH PROPOFOL  N/A 12/13/2015   Procedure: COLONOSCOPY WITH PROPOFOL ;  Surgeon: Lamar DASEN Viktoria, MD;  Location: [facility]COPY;  Service: Endoscopy;  Laterality: N/A;   ear surgery  05/2012   [name].  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  Benign.   HEMORRHOID SURGERY  1978   [place]   LAPAROSCOPIC CHOLECYSTECTOMY  1982   rotator cuff surgery Right 05/14/2016   VASECTOMY  1978      Medications: Prior to Admission medications   Medication Sig Start Date End Date Taking? Authorizing Provider  acetaminophen  (TYLENOL ) 325 MG tablet Take 2 tablets (650 mg total) by mouth every 6 (six) hours as needed for mild pain (pain score 1-3), fever, headache or moderate pain (pain score 4-6) (or Fever >/= 101). 11/21/23   Wieting, Richard, MD  albuterol  (VENTOLIN  HFA) 108 (90 Base) MCG/ACT inhaler Inhale 1-2 puffs into the lungs every 6 (six)  hours as needed. 09/11/23   Corlis Burnard DEL, NP  Cyanocobalamin  (VITAMIN B 12) 500 MCG TABS Take 100 mg by mouth daily at 6 (six) AM.    [provider]  cyclobenzaprine  (FLEXERIL ) 10 MG tablet Take 1 tablet (10 mg total) by mouth 3 (three) times daily as needed for muscle spasms. 11/11/23   Gasper Nancyann BRAVO, MD  lisinopril  (ZESTRIL ) 40 MG tablet Take 0.5 tablets (20 mg total) by mouth daily. 11/11/23   Gasper Nancyann BRAVO,  MD  Multiple Vitamins-Minerals (MULTIVITAMIN PO) Take by mouth daily.    [provider]  ondansetron  (ZOFRAN ) 4 MG tablet Take 1 tablet (4 mg total) by mouth every 8 (eight) hours as needed for nausea or vomiting. 11/21/23   Josette Ade, MD  oxyCODONE  (OXY IR/ROXICODONE ) 5 MG immediate release tablet Take 1 tablet (5 mg total) by mouth every 6 (six) hours as needed for up to 5 days for severe pain (pain score 7-10). 11/21/23 11/26/23  Josette Ade, MD  polyethylene glycol (MIRALAX ) 17 g packet Take 17 g by mouth daily as needed. 11/21/23   Josette Ade, MD  rosuvastatin  (CRESTOR ) 5 MG tablet Take 1 tablet (5 mg total) by mouth daily. 10/12/23   [name], Angela M, MD  vitamin E  400 UNIT capsule Take 400 Units by mouth daily.    [provider]     Family History  Problem Relation Age of Onset   Heart disease Mother        first MI in 54s   Diabetes Mother    Hyperlipidemia Mother    Arthritis Mother        rheumatoid   Cancer Father        lung   Diabetes Father    Arthritis Sister    Hyperlipidemia Sister    Hyperlipidemia Sister    Hypertension Sister    Hyperlipidemia Sister    Kidney disease Neg Hx    Prostate cancer Neg Hx    Colon cancer Neg Hx     Social History   Socioeconomic History   Marital status: Married    Spouse name: Heron   Number of children: 2   Years of education: 2 years college   Highest education level: Not on file  Occupational History   Occupation: retired    Comment: postal service in 2005  Tobacco Use   Smoking status: Never   Smokeless tobacco: Never  Vaping Use   Vaping status: Never Used  Substance and Sexual Activity   Alcohol use: Not Currently    Alcohol/week: 5.0 standard drinks of alcohol    Types: 5 Cans of beer per week    Comment: weekends beer, 4- 6 total [employer]   Drug use: No   Sexual activity: Yes    Partners: Female  Other Topics Concern   Not on file  Social History Narrative    Always uses seat belts. Smoke alarm and carbon monoxide detector in the home. Guns in the home stored in locked cabinet.Caffeine use: Coffee 2 servings per day, moderate amount. Exercise: Moderate walking 2 - 4 miles daily, 2 - 3 days per week.    Marital status:  Married x 47 years, happily.      Children:  2 children; 1 grandchild (20).      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      Tobacco: never      Alcohol:  Weekends; beer 4-6 per weekend.  Exercise:  Walking every other day 2.5 miles three times per day.      Seatbelt: 100%; no texting      Guns: secured guns.      Living Will: completed in 2014.  FULL CODE but no prolonged measures.        ADLs: independent with ADLs; no assistant devices   Social Drivers of Health   Financial Resource Strain: Not on file  Food Insecurity: No Food Insecurity (11/19/2023)   Hunger Vital Sign    Worried About Running Out of Food in the Last Year: Never true    Ran Out of Food in the Last Year: Never true  Transportation Needs: No Transportation Needs (11/19/2023)   PRAPARE - Administrator, Civil Service (Medical): No    Lack of Transportation (Non-Medical): No  Physical Activity: Not on file  Stress: Not on file  Social Connections: Unknown (11/19/2023)   Social Connection and Isolation Panel    Frequency of Communication with Friends and Family: Three times a week    Frequency of Social Gatherings with Friends and Family: Three times a week    Attends Religious Services: Patient declined    Active Member of Clubs or Organizations: Patient declined    Attends Banker Meetings: Patient declined    Marital Status: Married    Review of Systems: A 12 point ROS discussed and pertinent positives are indicated in the HPI above.  All other systems are negative.  Vital Signs: BP 136/87   Pulse (!) 109   Temp (!) 97.5 F (36.4 C) (Oral)   Resp 14   Ht 6' (1.829 m)   Wt 200 lb (90.7 kg)   SpO2 94%    BMI 27.12 kg/m   Physical Exam Vitals reviewed.  Constitutional:      Appearance: Normal appearance.  HENT:     Head: Normocephalic and atraumatic.     Mouth/Throat:     Mouth: Mucous membranes are moist.     Pharynx: Oropharynx is clear.  Cardiovascular:     Rate and Rhythm: Normal rate and regular rhythm.     Heart sounds: Normal heart sounds.  Pulmonary:     Effort: Pulmonary effort is normal.     Breath sounds: Normal breath sounds.  Abdominal:     General: Abdomen is flat.     Palpations: Abdomen is soft.     Tenderness: There is no abdominal tenderness.  Musculoskeletal:        General: Normal range of motion.     Cervical back: Normal range of motion.  Skin:    General: Skin is warm and dry.  Neurological:     General: No focal deficit present.     Mental Status: He is alert and oriented to person, place, and time. Mental status is at baseline.  Psychiatric:        Mood and Affect: Mood normal.        Behavior: Behavior normal.        Judgment: Judgment normal.     Imaging: MR PELVIS W WO CONTRAST Result Date: 11/21/2023 CLINICAL DATA:  Right hilar mass with osseous metastatic disease, including pelvic tumor EXAM: MRI PELVIS WITHOUT AND WITH CONTRAST TECHNIQUE: Multiplanar multisequence MR imaging of the pelvis was performed both before and after administration of intravenous contrast. CONTRAST:  9mL GADAVIST  GADOBUTROL  1 MMOL/ML IV SOLN COMPARISON:  Lumbar MRI 11/19/2023 FINDINGS: OSSEOUS STRUCTURES AND JOINTS: Substantial metastatic burden to the bilateral iliac bones, right  ischium, bilateral posterior acetabular walls, left pubic bone,, central and right sacrum, bilateral femoral neck, and bilateral intertrochanteric region. In particular, tumor in the proximal femurs predisposes the patient to pathologic fracture and special cautions with weight-bearing activities is recommended. Regarding the dominant left iliac lesion, there is extensive extraosseous extension  of tumor both anteriorly along the iliopsoas and upper pelvic sidewall; posteriorly along the gluteus minimus and gluteus medius, and even posteriorly along the lower paraspinal and medial left gluteus maximus musculature. A small amount of tumor is present along the left sciatic notch but does not visibly impinge on the left sciatic nerve. Likewise there is extraosseous extension of tumor anteriorly associated with the right sacral lesion, partially tracking along the upper margin of the right piriformis muscle. Mild extraosseous extension of tumor anteriorly and posteriorly from the right iliac lesion is shown on image 20 of series 14, tracking along the right iliopsoas and right gluteus minimus. There is tumor along the attachment site of the right rectus femoris muscle, which could predispose to avulsion. MUSCULOTENDINOUS: Muscular involvement by extraosseous extension of tumor as noted above. No separate purely muscular metastatic lesion identified. There is muscular edema involving portions of the bilateral iliopsoas musculature as well as left greater than right gluteal musculature and hip adductor musculature, likely related to the above described tumors. SACRAL PLEXUS: Tumor from the left iliac bone is tangential to components of the left sacral plexus example on image 19 of series 8 but does not demonstrably involve the sacral plexus at this time. OTHER: Scattered small pelvic lymph nodes, one of the largest is a 0.9 cm left iliac node on image 17 series 14. Mild presacral edema likely related to the sacral metastatic lesion. Mildly prominent median lobe of prostate gland indents the bladder base. Mildly fatty left spermatic cord. IMPRESSION: 1. Substantial metastatic burden to the bilateral iliac bones, right ischium, bilateral posterior acetabular walls, left pubic bone, central and right sacrum, bilateral femoral neck, and bilateral intertrochanteric region. In particular, tumor in the proximal femurs  predisposes the patient to pathologic fracture and special cautions with weight-bearing activities is recommended. 2. Extraosseous extension of tumor from the dominant left iliac lesion, tracking along the left iliopsoas and upper pelvic sidewall; posteriorly along the gluteus minimus and gluteus medius, and even posteriorly along the lower paraspinal and medial left gluteus maximus musculature. 3. Extraosseous extension of tumor from the right iliac lesion, tracking along the right iliopsoas and right gluteus minimus. 4. Tumor along the attachment site of the right rectus femoris muscle, which could predispose to avulsion. 5. Scattered small pelvic lymph nodes, one of the largest is a 0.9 cm left iliac node. 6. Mildly prominent median lobe of prostate gland indents the bladder base. 7. Mildly fatty left spermatic cord. Electronically Signed   By: Ryan Salvage M.D.   On: 11/21/2023 12:09   MR Lumbar Spine W Wo Contrast Result Date: 11/19/2023 CLINICAL DATA:  Low back pain, cancer suspected EXAM: MRI LUMBAR SPINE WITHOUT AND WITH CONTRAST TECHNIQUE: Multiplanar and multiecho pulse sequences of the lumbar spine were obtained without and with intravenous contrast. CONTRAST:  9mL GADAVIST  GADOBUTROL  1 MMOL/ML IV SOLN COMPARISON:  None Available. FINDINGS: Segmentation:  Standard. Alignment:  Physiologic. Vertebrae: T1 hypointense, stir hyperintense, and enhancing lesions at L2 and involving the partially imaged sacral vertebral bodies and sacrum, compatible with metastases. Conus medullaris and cauda equina: Conus extends to the L1 level. Conus and cauda equina appear normal. Paraspinal and other soft tissues: Partially imaged  suspected extraosseous extension of tumor involving the pelvis on the left and possibly the right. Disc levels: T12-L1: No significant disc protrusion, foraminal stenosis, or canal stenosis. L1-L2: No significant disc protrusion, foraminal stenosis, or canal stenosis. L2-L3: No  significant disc protrusion, foraminal stenosis, or canal stenosis. L3-L4: Mild facet arthropathy.  No significant stenosis. L4-L5: Moderate facet arthropathy and mild disc bulging. Mild left foraminal stenosis. Patent canal. L5-S1: Moderate bilateral facet arthropathy and mild disc bulging. Mild right foraminal stenosis. Pain canal. IMPRESSION: 1. 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 2. Partially imaged suspected extraosseous extension of tumor involving the pelvis on the left and potentially the right. Recommend MRI of the pelvis with contrast for further evaluation. Electronically Signed   By: Gilmore GORMAN Molt M.D.   On: 11/19/2023 23:53   CT CHEST WO CONTRAST Result Date: 11/19/2023 CLINICAL DATA:  Generalized weakness. Dyspnea. Concern for lung cancer. * Tracking Code: BO * EXAM: CT CHEST WITHOUT CONTRAST TECHNIQUE: Multidetector CT imaging of the chest was performed following the standard protocol without IV contrast. RADIATION DOSE REDUCTION: This exam was performed according to the departmental dose-optimization program which includes automated exposure control, adjustment of the mA and/or kV according to patient size and/or use of iterative reconstruction technique. COMPARISON:  Plain film of earlier today.  No prior CT FINDINGS: Cardiovascular: Aortic atherosclerosis. Tortuous thoracic aorta. Normal heart size, without pericardial effusion. Left main and 3 vessel coronary artery calcification. Mediastinum/Nodes: No supraclavicular adenopathy. Subcarinal nodal mass measures 1.6 x 3.2 cm on 69/3. Right hilar nodal mass is poorly delineated secondary to noncontrast technique. Estimated at 3.2 x 2.8 cm on 68/3, causing endobronchial compression as detailed below. Lungs/Pleura: No pleural fluid. Endobronchial marked narrowing, near obstruction involving the bronchus intermedius including on 70/4. More mild narrowing of the proximal right middle  and right lower lobe bronchi. Minimal exclusion of the inferior most right lung base. 1.1 x 1.3 cm soft tissue density within the anterior most right lower lobe is favored to be pulmonary parenchymal nodule over an infrahilar node. Example on image 68/4 Scattered calcified granulomas. Upper Abdomen: Nonspecific caudate lobe enlargement. Cholecystectomy. Normal imaged portions of the spleen, stomach, pancreas, adrenal glands, left kidney. Musculoskeletal: Mid and lower thoracic spondylosis. IMPRESSION: 1. 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 The dominant right hilar nodal mass causes narrowing of the bronchus intermedius. Recommend multidisciplinary thoracic oncology consultation for eventual PET and tissue sampling. Alternatively, sampling via bronchoscopy could be performed. 2. 1.3 cm soft tissue density along the anterior most right lower lobe is favored to represent a parenchymal pulmonary nodule and is most likely the site of primary. An enlarged infrahilar node could look similar. 3. Incidental findings, including: Coronary artery atherosclerosis. Aortic Atherosclerosis (ICD10-I70.0). Electronically Signed   By: Rockey Kilts M.D.   On: 11/19/2023 12:44   DG Chest 2 View Result Date: 11/19/2023 CLINICAL DATA:  weakness EXAM: CHEST - 2 VIEW COMPARISON:  October 16, 2023 FINDINGS: No focal airspace consolidation, pleural effusion, or pneumothorax. No cardiomegaly. Tortuous aorta with aortic atherosclerosis. No acute fracture or destructive lesions. Multilevel thoracic osteophytosis. IMPRESSION: No acute cardiopulmonary abnormality. Electronically Signed   By: Rogelia Myers M.D.   On: 11/19/2023 11:29   US  Abdomen Limited RUQ (LIVER/GB) Result Date: 11/04/2023 CLINICAL DATA:  Elevated bilirubin and alkaline phosphatase EXAM: ULTRASOUND ABDOMEN LIMITED RIGHT UPPER QUADRANT COMPARISON:  None available FINDINGS:  Gallbladder: Surgically absent Common bile  duct: Diameter: 5 mm Liver: Parenchymal echogenicity: Diffusely increased Contours: Normal Lesions: None Portal vein: Patent.  Hepatopetal flow Other: None. IMPRESSION: Diffuse increased echogenicity of the hepatic parenchyma is a nonspecific indicator of hepatocellular dysfunction, most commonly steatosis. Electronically Signed   By: Aliene Lloyd M.D.   On: 11/04/2023 10:23    Labs:  CBC: Recent Labs    10/16/23 1330 11/19/23 0957 11/20/23 0511 11/24/23 1103  WBC 11.4* 8.5 7.8 10.3  HGB 12.6* 10.5* 8.9* 10.2*  HCT 37.9* 31.2* 27.0* 31.4*  PLT 344 410* 340 366    COAGS: Recent Labs    10/16/23 1330 11/24/23 1103  INR 1.0 1.0  APTT 33  --     BMP: Recent Labs    10/16/23 1330 10/26/23 0816 11/12/23 0900 11/19/23 0957 11/20/23 0511 11/21/23 0244  NA 133*   < > 135 133* 137 134*  K 4.6   < > 5.0 4.1 4.2 4.0  CL 99   < > 96 97* 101 100  CO2 23   < > 19* 22 25 25   GLUCOSE 137*   < > 112* 114* 90 100*  BUN 22   < > 20 18 16 20   CALCIUM  9.8   < > 11.3* 10.8* 9.8 10.4*  CREATININE 1.77*   < > 1.84* 1.46* 1.30* 1.12  GFRNONAA 40*  --   --  50* 57* >60   < > = values in this interval not displayed.    LIVER FUNCTION TESTS: Recent Labs    10/26/23 0816 11/04/23 0846 11/19/23 0957 11/19/23 1347 11/21/23 0244  BILITOT 1.6* 1.8* 2.6* 2.5* 1.3*  AST 34 40 39  --  70*  ALT 24 41 29  --  105*  ALKPHOS 205* 223* 173*  --  222*  PROT 6.8 7.2 7.3  --  6.3*  ALBUMIN 4.3 4.1 3.6  --  3.1*    TUMOR MARKERS: No results for input(s): AFPTM, CEA, CA199, CHROMGRNA in the last 8760 hours.  Assessment and Plan:  Request for image guided L psoas soft tissue mass approved for 7/29 with Dr. Luverne. No contraindications for procedure identified in ROS, physical exam, or review of pre-sedation considerations. 7/29 CBC and INR ordered 7/26 MR pelvis imaging available and reviewed VSS, afebrile Patient does not take a blood  thinner     Risks and benefits of L psoas soft tissue mass biopsy was discussed with the patient and/or patient's family including, but not limited to bleeding, infection, damage to adjacent structures or low yield requiring additional tests.  All of the questions were answered and there is agreement to proceed.  Consent signed and in chart.   Thank you for allowing our service to participate in LAMONE FERRELLI 's care.    Electronically Signed: Glennon CHRISTELLA Bal, PA-C   11/24/2023, 11:32 AM     I spent a total of  30 Minutes   in face to face in clinical consultation, greater than 50% of which was counseling/coordinating care for L psoas/left gluteal soft tissue mass biopsy.

## 2023-11-24 NOTE — Discharge Instructions (Signed)
 Needle Biopsy, Care After These instructions tell you how to care for yourself after your procedure. Your doctor may also give you more specific instructions. Call your doctor if you have any problems or questions. What can I expect after the procedure? After the procedure, it is common to have: Soreness. Bruising. Mild pain. Follow these instructions at home:  Return to your normal activities tomorrow. Take over-the-counter and prescription medicines only as told by your doctor. Wash your hands with soap and water before you change your bandage (dressing). If you cannot use soap and water, use hand sanitizer. You may remove your bandage in 2 days Check your puncture site every day for signs of infection. Watch for: Redness, swelling, or pain. Fluid or blood.  Pus or a bad smell. Warmth. You can shower tomorrow Keep all follow-up visits as told by your doctor. This is important. Contact a doctor if you have: A fever. Redness, swelling, or pain at the puncture site, and it lasts longer than a few days. Fluid, blood, or pus coming from the puncture site. Warmth coming from the puncture site. Get help right away if: You have a lot of bleeding from the puncture site. Summary After the procedure, it is common to have soreness, bruising, or mild pain at the puncture site. Check your puncture site every day for signs of infection, such as redness, swelling, or pain. Get help right away if you have severe bleeding from your puncture site. This information is not intended to replace advice given to you by your health care provider. Make sure you discuss any questions you have with your health care provider. Document Revised: 04/27/2017 Document Reviewed: 04/27/2017 Elsevier Patient Education  2020 [employer].

## 2023-11-25 ENCOUNTER — Ambulatory Visit
Attending: Student in an Organized Health Care Education/Training Program | Admitting: Student in an Organized Health Care Education/Training Program

## 2023-11-25 ENCOUNTER — Other Ambulatory Visit: Payer: Self-pay | Admitting: Nephrology

## 2023-11-25 ENCOUNTER — Telehealth: Payer: Self-pay

## 2023-11-25 ENCOUNTER — Encounter: Payer: Self-pay | Admitting: Student in an Organized Health Care Education/Training Program

## 2023-11-25 VITALS — BP 88/69 | HR 116 | Temp 96.6°F | Resp 16 | Ht 72.0 in | Wt 200.0 lb

## 2023-11-25 DIAGNOSIS — C3401 Malignant neoplasm of right main bronchus: Secondary | ICD-10-CM | POA: Insufficient documentation

## 2023-11-25 DIAGNOSIS — C7951 Secondary malignant neoplasm of bone: Secondary | ICD-10-CM | POA: Diagnosis not present

## 2023-11-25 DIAGNOSIS — M5441 Lumbago with sciatica, right side: Secondary | ICD-10-CM | POA: Diagnosis present

## 2023-11-25 DIAGNOSIS — G893 Neoplasm related pain (acute) (chronic): Secondary | ICD-10-CM | POA: Insufficient documentation

## 2023-11-25 DIAGNOSIS — M5442 Lumbago with sciatica, left side: Secondary | ICD-10-CM | POA: Insufficient documentation

## 2023-11-25 DIAGNOSIS — G894 Chronic pain syndrome: Secondary | ICD-10-CM | POA: Diagnosis present

## 2023-11-25 DIAGNOSIS — N1831 Chronic kidney disease, stage 3a: Secondary | ICD-10-CM

## 2023-11-25 DIAGNOSIS — N179 Acute kidney failure, unspecified: Secondary | ICD-10-CM

## 2023-11-25 MED ORDER — OXYCODONE-ACETAMINOPHEN 5-325 MG PO TABS: 1.0000 | ORAL_TABLET | Freq: Four times a day (QID) | ORAL | 0 refills | Status: DC | PRN

## 2023-11-25 MED ORDER — GABAPENTIN 300 MG PO CAPS: ORAL_CAPSULE | ORAL | 0 refills | Status: DC

## 2023-11-25 NOTE — Transitions of Care (Post Inpatient/ED Visit) (Signed)
   11/25/2023  Name: KIENAN DOUBLIN MRN: [id number] DOB: [birth year]  Today's TOC FU Call Status: Today's TOC FU Call Status:: Unsuccessful Call (3rd Attempt) Unsuccessful Call (1st Attempt) Date: 11/23/23 Unsuccessful Call (2nd Attempt) Date: 11/24/23 Unsuccessful Call (3rd Attempt) Date: 11/25/23  Attempted to reach the patient regarding the most recent Inpatient/ED visit.  Follow Up Plan: No further outreach attempts will be made at this time. We have been unable to contact the patient.  Signature Julian Lemmings, LPN [facility] Nurse Health Advisor Direct Dial [phone]

## 2023-11-25 NOTE — Progress Notes (Signed)
 Safety precautions to be maintained throughout the outpatient stay will include: orient to surroundings, keep bed in low position, maintain call bell within reach at all times, provide assistance with transfer out of bed and ambulation.

## 2023-11-25 NOTE — Progress Notes (Signed)
 PROVIDER NOTE: Interpretation of information contained herein should be left to medically-trained personnel. Specific patient instructions are provided elsewhere under Patient Instructions section of medical record. This document was created in part using AI and STT-dictation technology, any transcriptional errors that may result from this process are unintentional.  Patient: Ruben Garcia  Service: E/M Encounter  Provider: Wallie Sherry, MD  DOB: 05-May-1945  Delivery: Face-to-face  Specialty: Interventional Pain Management  MRN: [id number]  Setting: Ambulatory outpatient facility  Specialty designation: 09  Type: New Patient  Location: Outpatient office facility  PCP: Myrla Jon HERO, MD  DOS: 11/25/2023    Referring Prov.: [name], Jon HERO, MD   Primary Reason(s) for Visit: Encounter for initial evaluation of one or more chronic problems (new to examiner) potentially causing chronic pain, and posing a threat to normal musculoskeletal function. (Level of risk: High) CC: Back Pain  HPI  Ruben Garcia is a 78 y.o. year old, male patient, who comes for the first time to our practice referred by Myrla Jon HERO, MD for our initial evaluation of his chronic pain. 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 Today he comes in for evaluation of his Back Pain  Pain Assessment: Location: Upper, Mid, Lower Back Radiating: into buttocks and  back of thighs bilat Onset: More than a month ago Duration: Chronic pain Quality: Sharp, Burning Severity: 4  (has been taking oxycodone  5mg  Q6hours)/10 (subjective, self-reported pain score)  Effect on ADL: limits daily activities Timing: Constant Modifying factors: oxycodone  BP: (!) 88/69  HR: (!) 116  Onset and Duration: Present less than 3 months Cause of pain: Unknown Severity: NAS-11 at its worse: 10/10 Timing: Not influenced by the time of the day Aggravating Factors: Prolonged standing, Squatting, and Twisting Alleviating Factors: Resting Associated Problems: Pain that wakes patient up Quality of Pain: Agonizing, Constant, and Deep Previous Examinations or Tests: Biopsy, Bone scan, CT scan, and MRI scan Previous Treatments: Narcotic medications  Ruben Garcia is being evaluated for possible interventional pain management therapies for the treatment of his chronic pain.   Discussed the use of AI scribe software for clinical note transcription with the patient, who gave verbal consent to proceed.  History of Present Illness   Ruben Garcia is a 78 year old male with a recent cancer diagnosis who presents with pain management concerns.  He has been recently diagnosed with cancer, with imaging and a biopsy performed on his pelvic area. He reports pain at the biopsy site since the procedure. The biopsy was conducted yesterday, and this is currently his primary area of pain.  He has been experiencing pain since his visit to the emergency room last Thursday, where he was started on oxycodone . He was discharged on Saturday evening. Prior to this, he was taking Tylenol , which he described as ineffective. He is currently taking oxycodone  5 mg every six hours, three times a day, and reports that it is 'sort of doing the job'. He has a few doses left and is concerned about managing his pain once it runs out.  He experiences  low back pain, which becomes more prominent when it intensifies,  overshadowing other pain. He describes the pain as intense and difficult to localize, stating that it 'takes over' when it hits. He has not been taking Tylenol  separately since starting oxycodone .  He has a history of visiting the emergency room and walk-in clinics multiple times without a diagnosis until a relative, who is a doctor, identified the issue. He mentions experiencing weakness and low blood pressure, which were not addressed during previous medical visits.  No current use of Tylenol  alone and confirms taking oxycodone  three times a day, every six hours, including at night.        Historic Controlled Substance Pharmacotherapy Review PMP and historical list of controlled substances:  11/21/2023 11/21/2023  1 Oxycodone  Hcl (Ir) 5 Mg Tablet 20.00 5 Ri Wie [id number] [facility] (1123) 0/0 30.00 MME Medicare [place]     Historical Monitoring: The patient  reports no history of drug use. List of prior UDS Testing: Lab Results  Component Value Date   [facility] <15 10/16/2023   Historical Background Evaluation: [place] PMP: PDMP reviewed during this encounter. Review of the past 62-months conducted.             [place] Department of public safety, offender search: Engineer, mining Information) Non-contributory Risk Assessment Profile: Aberrant behavior: None observed or detected today Risk factors for fatal opioid overdose: None identified today Fatal overdose hazard ratio (HR): Calculation deferred Non-fatal overdose hazard ratio (HR): Calculation deferred Risk of opioid abuse or dependence: 0.7-3.0% with doses <= 36 MME/day and 6.1-26% with doses >= 120 MME/day. Substance use disorder (SUD) risk level: See below Personal History of Substance Abuse (SUD-Substance use disorder):  Alcohol: Negative  Illegal Drugs: Negative  Rx Drugs: Negative  ORT Risk Level calculation: Low Risk  Opioid Risk Tool - 11/25/23 1433       Family History of Substance Abuse   Alcohol Negative    Illegal Drugs Negative    Rx Drugs  Negative      Personal History of Substance Abuse   Alcohol Negative    Illegal Drugs Negative    Rx Drugs Negative      Age   Age between 16-45 years  No      History of Preadolescent Sexual Abuse   History of Preadolescent Sexual Abuse Negative or Male      Psychological Disease   Psychological Disease Negative    Depression Negative      Total Score   Opioid Risk Tool Scoring 0    Opioid Risk Interpretation Low Risk         ORT Scoring interpretation table:  Score <3 = Low Risk for SUD  Score between 4-7 = Moderate Risk for SUD  Score >8 = High Risk for Opioid Abuse   PHQ-2 Depression Scale:  Total score: 0  PHQ-2 Scoring interpretation table: (Score and probability of major depressive disorder)  Score 0 = No depression  Score 1 = 15.4% Probability  Score 2 = 21.1% Probability  Score 3 = 38.4% Probability  Score 4 = 45.5% Probability  Score 5 = 56.4% Probability  Score 6 = 78.6% Probability   PHQ-9 Depression Scale:  Total score: 0  PHQ-9 Scoring interpretation table:  Score 0-4 = No depression  Score 5-9 = Mild depression  Score 10-14 = Moderate depression  Score 15-19 = Moderately severe depression  Score 20-27 = Severe depression (2.4 times higher risk of SUD and 2.89 times higher risk of overuse)  Pharmacologic Plan: Patient to sign opioid agreement, will get baseline UDS, low risk for COT              Current Outpatient Medications:    acetaminophen  (TYLENOL ) 325 MG tablet, Take 2 tablets (650 mg total) by mouth every 6 (six) hours as needed for mild pain (pain score 1-3), fever, headache or moderate pain (pain score 4-6) (or Fever >/= 101)., Disp: , Rfl:    albuterol  (VENTOLIN  HFA) 108 (90 Base) MCG/ACT inhaler, Inhale 1-2 puffs into the lungs every 6 (six) hours as needed., Disp: 18 g, Rfl: 0   Cyanocobalamin  (VITAMIN B 12) 500 MCG TABS, Take 100 mg by mouth daily at 6 (six) AM., Disp: , Rfl:    cyclobenzaprine  (FLEXERIL ) 10 MG tablet, Take 1 tablet  (10 mg total) by mouth 3 (three) times daily as needed for muscle spasms., Disp: 30 tablet, Rfl: 2   gabapentin  (NEURONTIN ) 300 MG capsule, Take 1 capsule (300 mg total) by mouth at bedtime for 7 days, THEN 1 capsule (300 mg total) 2 (two) times daily., Disp: 73 capsule, Rfl: 0   lisinopril  (ZESTRIL ) 40 MG tablet, Take 0.5 tablets (20 mg total) by mouth daily., Disp: , Rfl:    Multiple Vitamins-Minerals (MULTIVITAMIN PO), Take by mouth daily., Disp: , Rfl:    ondansetron  (ZOFRAN ) 4 MG tablet, Take 1 tablet (4 mg total) by mouth every 8 (eight) hours as needed for nausea or vomiting., Disp: 30 tablet, Rfl: 0   oxyCODONE  (OXY IR/ROXICODONE ) 5 MG immediate release tablet, Take 1 tablet (5 mg total) by mouth every 6 (six) hours as needed for up to 5 days for severe pain (pain score 7-10)., Disp: 20 tablet, Rfl: 0   oxyCODONE -acetaminophen  (PERCOCET) 5-325 MG tablet, Take 1 tablet by mouth every 6 (six) hours as needed for severe pain (pain score 7-10). Must last 30 days., Disp: 90 tablet, Rfl: 0   polyethylene glycol (MIRALAX ) 17 g packet, Take 17 g by mouth daily as needed., Disp: 30 each, Rfl: 0   rosuvastatin  (CRESTOR ) 5 MG tablet, Take 1 tablet (5 mg total) by mouth daily., Disp: 90 tablet, Rfl: 1   vitamin E  400 UNIT capsule, Take 400 Units by mouth daily., Disp: , Rfl:   Imaging Review   MR SHOULDER RIGHT WO CONTRAST  Narrative CLINICAL DATA:  Increasing right shoulder pain.  EXAM: MRI OF THE RIGHT SHOULDER WITHOUT CONTRAST  TECHNIQUE: Multiplanar, multisequence MR imaging of the shoulder was performed. No intravenous contrast was administered.  COMPARISON:  None.  FINDINGS: Rotator cuff: Moderate tendinosis of the supraspinatus tendon with a 12 mm full-thickness tear of the anterior supraspinatus tendon. Moderate tendinosis of the infraspinatus tendon. Teres minor tendon is intact. Subscapularis tendon is intact.  Muscles: No atrophy or fatty replacement of nor abnormal  signal within, the muscles of the rotator cuff.  Biceps long head:  Intact.  Acromioclavicular Joint: Normal acromioclavicular joint. Type II acromion. Small amount of subacromial/subdeltoid bursal fluid.  Glenohumeral Joint: No joint effusion.  No chondral defect.  Labrum: Increased signal in the superior posterior labrum likely reflecting degeneration.  Bones: Subcortical reactive marrow changes at the supraspinatus insertion. No fracture or dislocation.  Other: No fluid collection or hematoma.  IMPRESSION: 1. Moderate tendinosis of the supraspinatus tendon with a 12 mm full-thickness tear of the anterior supraspinatus tendon. 2. Moderate tendinosis of the infraspinatus tendon.   Electronically Signed By: Julaine Blanch On: 04/04/2016 10:06  MR Lumbar Spine W Wo Contrast  Narrative  CLINICAL DATA:  Low back pain, cancer suspected  EXAM: MRI LUMBAR SPINE WITHOUT AND WITH CONTRAST  TECHNIQUE: Multiplanar and multiecho pulse sequences of the lumbar spine were obtained without and with intravenous contrast.  CONTRAST:  9mL GADAVIST  GADOBUTROL  1 MMOL/ML IV SOLN  COMPARISON:  None Available.  FINDINGS: Segmentation:  Standard.  Alignment:  Physiologic.  Vertebrae: T1 hypointense, stir hyperintense, and enhancing lesions at L2 and involving the partially imaged sacral vertebral bodies and sacrum, compatible with metastases.  Conus medullaris and cauda equina: Conus extends to the L1 level. Conus and cauda equina appear normal.  Paraspinal and other soft tissues: Partially imaged suspected extraosseous extension of tumor involving the pelvis on the left and possibly the right.  Disc levels:  T12-L1: No significant disc protrusion, foraminal stenosis, or canal stenosis.  L1-L2: No significant disc protrusion, foraminal stenosis, or canal stenosis.  L2-L3: No significant disc protrusion, foraminal stenosis, or canal stenosis.  L3-L4: Mild facet arthropathy.   No significant stenosis.  L4-L5: Moderate facet arthropathy and mild disc bulging. Mild left foraminal stenosis. Patent canal.  L5-S1: Moderate bilateral facet arthropathy and mild disc bulging. Mild right foraminal stenosis. Pain canal.  IMPRESSION: 1. 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 2. Partially imaged suspected extraosseous extension of tumor involving the pelvis on the left and potentially the right. Recommend MRI of the pelvis with contrast for further evaluation.   Electronically Signed By: Gilmore GORMAN Molt M.D. On: 11/19/2023 23:53   CT BIOPSY  Narrative CLINICAL DATA:  Right hilar lung mass with lymphadenopathy and multiple bone lesions including the left iliac bone and soft tissue masses involving the left iliopsoas and gluteal musculature by MRI.  EXAM: CT GUIDED CORE BIOPSY OF LEFT GLUTEAL SOFT TISSUE MASS  ANESTHESIA/SEDATION: Moderate (conscious) sedation was employed during this procedure. A total of Versed  2.0 mg and Fentanyl  150 mcg was administered intravenously.  Moderate Sedation Time: 17 minutes. The patient's level of consciousness and vital signs were monitored continuously by radiology nursing throughout the procedure under my direct supervision.  PROCEDURE: The procedure risks, benefits, and alternatives were explained to the patient. Questions regarding the procedure were encouraged and answered. The patient understands and consents to the procedure. A time out was performed prior to initiating the procedure.  RADIATION DOSE REDUCTION: This exam was performed according to the departmental dose-optimization program which includes automated exposure control, adjustment of the mA and/or kV according to patient size and/or use of iterative reconstruction technique.  The left pelvic region was prepped with chlorhexidine  in a sterile fashion, and a sterile drape was applied  covering the operative field. A sterile gown and sterile gloves were used for the procedure. Local anesthesia was provided with 1% Lidocaine .  CT of the pelvis was performed in a supine position. Under CT guidance, a 17 gauge trocar needle was advanced to the level of a lateral left soft tissue mass deep to the gluteal musculature and abutting the left iliac bone. After confirming needle tip position, 3 separate coaxial 18 gauge core biopsy samples were obtained and submitted in formalin. Some Gel-Foam pledgets were advanced through the outer needle prior to its removal.  COMPLICATIONS: None  FINDINGS: Smooth, rounded configuration mass extending lateral to the left iliac bone and ileum measures up to approximately 4.1 x 7.2 cm in maximum transverse dimensions by unenhanced CT and lies deep to the gluteus minimus muscle. Biopsy was performed yielding solid tissue.  IMPRESSION: CT-guided biopsy of left  gluteal region soft tissue mass deep to the gluteus minimus muscle measuring just over approximately 7 cm in diameter by CT.   Electronically Signed By: Marcey Moan M.D. On: 11/24/2023 13:39   Complexity Note: Imaging results reviewed.                         ROS  Cardiovascular: Daily Aspirin intake and High blood pressure Pulmonary or Respiratory: No reported pulmonary signs or symptoms such as wheezing and difficulty taking a deep full breath (Asthma), difficulty blowing air out (Emphysema), coughing up mucus (Bronchitis), persistent dry cough, or temporary stoppage of breathing during sleep Neurological: No reported neurological signs or symptoms such as seizures, abnormal skin sensations, urinary and/or fecal incontinence, being born with an abnormal open spine and/or a tethered spinal cord Psychological-Psychiatric: No reported psychological or psychiatric signs or symptoms such as difficulty sleeping, anxiety, depression, delusions or hallucinations (schizophrenial),  mood swings (bipolar disorders) or suicidal ideations or attempts Gastrointestinal: No reported gastrointestinal signs or symptoms such as vomiting or evacuating blood, reflux, heartburn, alternating episodes of diarrhea and constipation, inflamed or scarred liver, or pancreas or irrregular and/or infrequent bowel movements Genitourinary: Kidney disease Hematological: No reported hematological signs or symptoms such as prolonged bleeding, low or poor functioning platelets, bruising or bleeding easily, hereditary bleeding problems, low energy levels due to low hemoglobin or being anemic Endocrine: No reported endocrine signs or symptoms such as high or low blood sugar, rapid heart rate due to high thyroid levels, obesity or weight gain due to slow thyroid or thyroid disease Rheumatologic: No reported rheumatological signs and symptoms such as fatigue, joint pain, tenderness, swelling, redness, heat, stiffness, decreased range of motion, with or without associated rash Musculoskeletal: Negative for myasthenia gravis, muscular dystrophy, multiple sclerosis or malignant hyperthermia Work History: Retired  Allergies  Ruben Garcia is allergic to codeine.  Laboratory Chemistry Profile   Renal Lab Results  Component Value Date   BUN 20 11/21/2023   CREATININE 1.12 11/21/2023   BCR 11 11/12/2023   GFRAA 78 02/16/2020   GFRNONAA >60 11/21/2023   SPECGRAV 1.015 09/13/2019   PHUR 5.5 09/13/2019   PROTEINUR NEGATIVE 11/19/2023     Electrolytes Lab Results  Component Value Date   NA 134 (L) 11/21/2023   K 4.0 11/21/2023   CL 100 11/21/2023   CALCIUM  10.4 (H) 11/21/2023     Hepatic Lab Results  Component Value Date   AST 70 (H) 11/21/2023   ALT 105 (H) 11/21/2023   ALBUMIN 3.1 (L) 11/21/2023   ALKPHOS 222 (H) 11/21/2023     ID Lab Results  Component Value Date   SARSCOV2NAA Not Detected 11/10/2018   STAPHAUREUS NEGATIVE 05/05/2016   MRSAPCR NEGATIVE 05/05/2016     Bone Lab Results   Component Value Date   VD125OH2TOT 198.0 (H) 11/20/2023     Endocrine Lab Results  Component Value Date   GLUCOSE 100 (H) 11/21/2023   GLUCOSEU NEGATIVE 11/19/2023   HGBA1C 5.5 10/08/2023     Neuropathy Lab Results  Component Value Date   HGBA1C 5.5 10/08/2023     CNS No results found for: COLORCSF, APPEARCSF, RBCCOUNTCSF, WBCCSF, POLYSCSF, LYMPHSCSF, EOSCSF, PROTEINCSF, GLUCCSF, JCVIRUS, CSFOLI, IGGCSF, LABACHR, ACETBL   Inflammation (CRP: Acute  ESR: Chronic) No results found for: CRP, ESRSEDRATE, LATICACIDVEN   Rheumatology No results found for: RF, ANA, LABURIC, URICUR, LYMEIGGIGMAB, LYMEABIGMQN, HLAB27   Coagulation Lab Results  Component Value Date   INR 1.0 11/24/2023  LABPROT 13.9 11/24/2023   APTT 33 10/16/2023   PLT 366 11/24/2023     Cardiovascular Lab Results  Component Value Date   CKTOTAL 23 (L) 11/19/2023   HGB 10.2 (L) 11/24/2023   HCT 31.4 (L) 11/24/2023     Screening Lab Results  Component Value Date   SARSCOV2NAA Not Detected 11/10/2018   STAPHAUREUS NEGATIVE 05/05/2016   MRSAPCR NEGATIVE 05/05/2016     Cancer No results found for: CEA, CA125, LABCA2   Allergens No results found for: ALMOND, APPLE, ASPARAGUS, AVOCADO, BANANA, BARLEY, BASIL, BAYLEAF, GREENBEAN, LIMABEAN, WHITEBEAN, BEEFIGE, REDBEET, BLUEBERRY, BROCCOLI, CABBAGE, MELON, CARROT, CASEIN, CASHEWNUT, CAULIFLOWER, CELERY     Note: Lab results reviewed.  PFSH  Drug: Ruben Garcia  reports no history of drug use. Alcohol:  reports that he does not currently use alcohol after a past usage of about 5.0 standard drinks of alcohol per week. Tobacco:  reports that he has never smoked. He has never used smokeless tobacco. Medical:  has a past medical history of Allergic rhinitis, cause unspecified, Basal cell carcinoma of skin, site unspecified (04/28/2010), BPH (benign prostatic  hypertrophy), Colon polyps, Essential hypertension, benign, Hemorrhoids, internal, Incomplete bladder emptying, Other abnormal glucose, Over weight, Personal history of colonic polyps, Personal history of other genital system and obstetric disorders(V13.29), Pure hypercholesterolemia, Unspecified disorder of kidney and ureter, and Unspecified disorder of liver. Family: family history includes Arthritis in his mother and sister; Cancer in his father; Diabetes in his father and mother; Heart disease in his mother; Hyperlipidemia in his mother, sister, sister, and sister; Hypertension in his sister.  Past Surgical History:  Procedure Laterality Date   BASAL CELL CARCINOMA EXCISION  2012   Facial        [place]   COLONOSCOPY  11/13/2010   single polyp; repeatin 5 years; [place]   COLONOSCOPY N/A 11/04/2021   Procedure: COLONOSCOPY;  Surgeon: Maryruth Ole DASEN, MD;  Location: [facility] ENDOSCOPY;  Service: Endoscopy;  Laterality: N/A;  REQUEST EARLY TIME   COLONOSCOPY WITH PROPOFOL  N/A 12/13/2015   Procedure: COLONOSCOPY WITH PROPOFOL ;  Surgeon: Lamar DASEN Viktoria, MD;  Location[facility] ENDOSCOPY;  Service: Endoscopy;  Laterality: N/A;   ear surgery  05/2012   [name].  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  Benign.   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) 06/26/2015   Obesity 02/03/2017   Full thickness rotator cuff tear 04/08/2016   Impingement syndrome of shoulder region 04/08/2016   Hyperglycemia 10/08/2023   Inadequate pain control 11/19/2023   Nausea 11/19/2023   Acute midline low back pain with bilateral sciatica 11/20/2023   Hypercalcemia 11/20/2023   Lung  mass 11/21/2023   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) 11/21/2023   Overweight (BMI 25.0-29.9) 11/21/2023   Anemia of chronic disease 11/21/2023   Elevated liver function tests 11/21/2023   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) 11/20/2023   Chronic pain syndrome 11/25/2023   Resolved Ambulatory Problems    Diagnosis Date Noted   Encounter for general adult medical examination without abnormal findings 11/29/2012   Other abnormal glucose 11/29/2012   Glucose intolerance (impaired glucose tolerance) 12/11/2014   Prediabetes 02/03/2017  Past Medical History:  Diagnosis Date   Allergic rhinitis, cause unspecified    Basal cell carcinoma of skin, site unspecified 04/28/2010   BPH (benign prostatic hypertrophy)    Colon polyps    Essential hypertension, benign    Hemorrhoids, internal    Incomplete bladder emptying    Over weight    Personal history of colonic polyps    Personal history of other genital system and obstetric disorders(V13.29)    Unspecified disorder of kidney and ureter    Unspecified disorder of liver    Constitutional Exam  General appearance: Well nourished, well developed, and well hydrated. In no apparent acute distress Vitals:   11/25/23 1436  BP: (!) 88/69  Pulse: (!) 116  Resp: 16  Temp: (!) 96.6 F (35.9 C)  SpO2: 98%  Weight: 200 lb (90.7 kg)  Height: 6' (1.829 m)   BMI Assessment: Estimated body mass index is 27.12 kg/m as calculated from the following:   Height as of this encounter: 6' (1.829 m).   Weight as of this encounter: 200 lb (90.7 kg).  BMI interpretation table: BMI level Category Range association with higher incidence of chronic pain  <18 kg/m2 Underweight   18.5-24.9 kg/m2 Ideal body weight   25-29.9 kg/m2 Overweight Increased incidence by 20%  30-34.9 kg/m2 Obese (Class I) Increased incidence by 68%  35-39.9 kg/m2 Severe obesity (Class II) Increased incidence by 136%  >40 kg/m2 Extreme obesity (Class III) Increased  incidence by 254%   Patient's current BMI Ideal Body weight  Body mass index is 27.12 kg/m. Ideal body weight: 77.6 kg (171 lb 1.2 oz) Adjusted ideal body weight: 82.8 kg (182 lb 10.3 oz)   BMI Readings from Last 4 Encounters:  11/25/23 27.12 kg/m  11/24/23 27.12 kg/m  11/19/23 28.35 kg/m  11/11/23 28.39 kg/m   Wt Readings from Last 4 Encounters:  11/25/23 200 lb (90.7 kg)  11/24/23 200 lb (90.7 kg)  11/19/23 209 lb (94.8 kg)  11/11/23 209 lb 4.8 oz (94.9 kg)    Psych/Mental status: Alert, oriented x 3 (person, place, & time)       Eyes: PERLA Respiratory: No evidence of acute respiratory distress  Thoracic Spine Area Exam  Skin & Axial Inspection: No masses, redness, or swelling Alignment: Symmetrical Functional ROM: Unrestricted ROM Stability: No instability detected Muscle Tone/Strength: Functionally intact. No obvious neuro-muscular anomalies detected. Sensory (Neurological): Unimpaired Muscle strength & Tone: No palpable anomalies Lumbar Spine Area Exam  Skin & Axial Inspection: No masses, redness, or swelling Alignment: Symmetrical Functional ROM: Pain restricted ROM       Stability: No instability detected Muscle Tone/Strength: Functionally intact. No obvious neuro-muscular anomalies detected. Sensory (Neurological): Musculoskeletal pain pattern  Gait & Posture Assessment  Ambulation: Unassisted Gait: Relatively normal for age and body habitus Posture: WNL  Lower Extremity Exam    Side: Right lower extremity  Side: Left lower extremity  Stability: No instability observed          Stability: No instability observed          Skin & Extremity Inspection: Skin color, temperature, and hair growth are WNL. No peripheral edema or cyanosis. No masses, redness, swelling, asymmetry, or associated skin lesions. No contractures.  Skin & Extremity Inspection: Skin color, temperature, and hair growth are WNL. No peripheral edema or cyanosis. No masses, redness, swelling,  asymmetry, or associated skin lesions. No contractures.  Functional ROM: Unrestricted ROM  Functional ROM: Unrestricted ROM                  Muscle Tone/Strength: Functionally intact. No obvious neuro-muscular anomalies detected.  Muscle Tone/Strength: Functionally intact. No obvious neuro-muscular anomalies detected.  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  Visit Diagnosis (New problems to examiner): 1. 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 Acute midline low back pain with bilateral sciatica   4. 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 A recent biopsy has exacerbated the pain. He currently manages the severe pain with oxycodone , initiated during an emergency room visit. Gabapentin  is added to address nerve pain and potentially reduce opioid use. Perform a urine screen. Prescribe Percocet (oxycodone  5 mg with acetaminophen  325 mg) every six hours as needed, with 90 tablets for one month. Continue current oxycodone  until depleted, then switch to Percocet. Start gabapentin  300 mg at  night for three days, increasing to twice daily if tolerated.  Low back pain with lumbar spondylosis   Low back pain is linked to lumbar spondylosis, with intermittent episodes that can become intense, overshadowing other pain. Arthritis in the lower back is possible, but the primary focus remains on managing metastatic pain. Consider interventional procedures for low back pain if necessary.      Future considerations for medication trials include Lyrica, Journavax  Lab Orders         Compliance Drug Analysis, Ur      Pharmacotherapy (current): Medications ordered:  Meds ordered this encounter  Medications   oxyCODONE -acetaminophen  (PERCOCET) 5-325 MG tablet    Sig: Take 1 tablet by mouth every 6 (six) hours as needed for severe pain (pain score 7-10). Must last 30 days.    Dispense:  90 tablet    Refill:  0    Chronic Pain: STOP Act (Not applicable) Fill 1 day early if closed on refill date. Avoid benzodiazepines within 8 hours of opioids   gabapentin  (NEURONTIN ) 300 MG capsule    Sig: Take 1 capsule (300 mg total) by mouth at bedtime for 7 days, THEN 1 capsule (300 mg total) 2 (two) times daily.    Dispense:  73 capsule    Refill:  0   Medications administered during this visit: Ruben Garcia had no medications administered during this visit.  Provider-requested follow-up: Return in about 4 weeks (around 12/23/2023) for MM, F2F.  Future Appointments  Date Time Provider Department Center  11/27/2023  1:00 PM [name], MD [facility] None  11/27/2023  1:30 PM [facility] None  12/01/2023  1:00 PM [facility]  12/22/2023  9:00 AM Miken Stecher, Wallie,  MD [facility] None  12/24/2023  9:00 AM Myrla, Jon HERO, MD [facility]  04/11/2024  9:00 AM Myrla, Jon HERO, MD [facility]  10/20/2024  8:40 AM Helon, Clotilda LABOR, PA-C [facility] None   I discussed the assessment and treatment plan with the patient. The patient was provided an opportunity to ask  questions and all were answered. The patient agreed with the plan and demonstrated an understanding of the instructions.  Patient advised to call back or seek an in-person evaluation if the symptoms or condition worsens.  Duration of encounter: .  Total time on encounter, as per AMA guidelines included both the face-to-face and non-face-to-face time personally spent by the physician and/or other qualified health care professional(s) on the day of the encounter (includes time in activities that require the physician or other qualified health care professional and does not include time in activities normally performed by clinical staff). Physician's time may include the following activities when performed: Preparing to see the patient (e.g., pre-charting review of records, searching for previously ordered imaging, lab work, and nerve conduction tests) Review of prior analgesic pharmacotherapies. Reviewing PMP Interpreting ordered tests (e.g., lab work, imaging, nerve conduction tests) Performing post-procedure evaluations, including interpretation of diagnostic procedures Obtaining and/or reviewing separately obtained history Performing a medically appropriate examination and/or evaluation Counseling and educating the patient/family/caregiver Ordering medications, tests, or procedures Referring and communicating with other health care professionals (when not separately reported) Documenting clinical information in the electronic or other health record Independently interpreting results (not separately reported) and communicating results to the patient/ family/caregiver Care coordination (not separately reported)  Note by: Wallie Sherry, MD (TTS and AI technology used. I apologize for any typographical errors that were not detected and corrected.) Date: 11/25/2023; Time: 3:47 PM

## 2023-11-25 NOTE — Telephone Encounter (Signed)
 Patient called and was upset about his current situation.  He states he has made multiple calls to our office and spoke with triage nurses but wasn't sure that you were aware of what was going on.  He said he likes you a lot but was disappointed that he couldn't get in to see you or talk to you.  Patient said he now has cancer all over and felt no one was really doing anything other than checking kidney function.

## 2023-11-26 NOTE — Telephone Encounter (Signed)
 Called patient to check in on him after hospital discharge.  Spoke with patient and listened to his re-counting about his symptoms and the many urgent/ER visits leading up to this diagnosis.  He doesn't need anything from uscurrently and believes he will know his plan better after Onc appt tomorrow.

## 2023-11-27 ENCOUNTER — Encounter: Payer: Self-pay | Admitting: *Deleted

## 2023-11-27 ENCOUNTER — Encounter: Payer: Self-pay | Admitting: Internal Medicine

## 2023-11-27 ENCOUNTER — Inpatient Hospital Stay: Attending: Internal Medicine | Admitting: Internal Medicine

## 2023-11-27 ENCOUNTER — Inpatient Hospital Stay

## 2023-11-27 VITALS — BP 115/73 | HR 113 | Temp 96.6°F | Resp 18 | Ht 72.0 in | Wt 198.7 lb

## 2023-11-27 DIAGNOSIS — Z5112 Encounter for antineoplastic immunotherapy: Secondary | ICD-10-CM | POA: Diagnosis present

## 2023-11-27 DIAGNOSIS — Z5189 Encounter for other specified aftercare: Secondary | ICD-10-CM | POA: Insufficient documentation

## 2023-11-27 DIAGNOSIS — C7951 Secondary malignant neoplasm of bone: Secondary | ICD-10-CM | POA: Insufficient documentation

## 2023-11-27 DIAGNOSIS — C83398 Diffuse large b-cell lymphoma of other extranodal and solid organ sites: Secondary | ICD-10-CM | POA: Diagnosis present

## 2023-11-27 DIAGNOSIS — Z5111 Encounter for antineoplastic chemotherapy: Secondary | ICD-10-CM | POA: Insufficient documentation

## 2023-11-27 MED ORDER — FENTANYL 25 MCG/HR TD PT72: 1.0000 | MEDICATED_PATCH | TRANSDERMAL | 0 refills | Status: DC

## 2023-11-27 MED ORDER — PREDNISONE 20 MG PO TABS: ORAL_TABLET | ORAL | 0 refills | Status: DC

## 2023-11-27 NOTE — Progress Notes (Signed)
 Met with patient during follow up visit with Dr. Rennie. All questions answered during visit. Reviewed upcoming appts. Informed that will call with his appt for port placement once scheduled. Pathology results pending at the time of visit. Message left with Dr. Rebbecca to call Dr. B directly. Pt made aware that he will be notified with results once finalized by pathology. Contact info given to patient and instructed to call with any questions or needs. Pt verbalized understanding.

## 2023-11-27 NOTE — Progress Notes (Signed)
 [facility] CONSULT NOTE  Patient Care Team: Myrla Jon HERO, MD as PCP - General (Family Medicine) Verdene Gills, RN as Oncology Nurse Navigator [name], MD as Consulting Physician (Oncology)  CHIEF COMPLAINTS/PURPOSE OF CONSULTATION: Lung mass/pelvic masses concerning for cancer  Oncology History   No history exists.    HISTORY OF PRESENTING ILLNESS: Patient ambulating-in wheel chair. accompanied by wife.   Ruben Garcia 78 y.o.  male pleasant patient with no prior history of smoking-is here for follow-up/of recent hospitalization with imaging concerns for cancer.  Patient was admitted to hospital for worsening back pain/hypercalcemia.  Of note patient was noted to have orsening shortness of breath and cough over the last few months.    However given his progressive symptoms patient presented to the hospital-the CT scan noncontrast showed hilar mass and also mediastinal adenopathy concerning for malignancy.  Also noted to have hypercalcemia and hypoalbuminemia; mild renal sufficiency.  MRI lower spine showed L5 lesion; and also pelvic MRI showed masses concerning for malignancy.  Patient was evaluated by pulmonary.  However given easy accessibility-patient underwent biopsy of the pelvic lesion.  Patient continues to have pain needing to take Percocet every 6 hours.  As per the wife patient has been wheezing intermittently.  Also having cough.  Patient is here to review his treatment options.   Review of Systems  Constitutional:  Positive for malaise/fatigue and weight loss. Negative for chills, diaphoresis and fever.  HENT:  Negative for nosebleeds and sore throat.   Eyes:  Negative for double vision.  Respiratory:  Positive for cough. Negative for hemoptysis, sputum production, shortness of breath and wheezing.   Cardiovascular:  Negative for chest pain, palpitations, orthopnea and leg swelling.  Gastrointestinal:  Negative for abdominal pain,  blood in stool, constipation, diarrhea, heartburn, melena, nausea and vomiting.  Genitourinary:  Negative for dysuria, frequency and urgency.  Musculoskeletal:  Positive for back pain, joint pain and myalgias.  Skin: Negative.  Negative for itching and rash.  Neurological:  Negative for dizziness, tingling, focal weakness, weakness and headaches.  Endo/Heme/Allergies:  Does not bruise/bleed easily.  Psychiatric/Behavioral:  Negative for depression. The patient is not nervous/anxious and does not have insomnia.     MEDICAL HISTORY:  Past Medical History:  Diagnosis Date   Allergic rhinitis, cause unspecified    Basal cell carcinoma of skin, site unspecified 04/28/2010   Nasal; [place].   BPH (benign prostatic hypertrophy)    Colon polyps    Essential hypertension, benign    Hemorrhoids, internal    Incomplete bladder emptying    Other abnormal glucose    Over weight    Personal history of colonic polyps    Personal history of other genital system and obstetric disorders(V13.29)    Pure hypercholesterolemia    Unspecified disorder of kidney and ureter    Unspecified disorder of liver     SURGICAL HISTORY: Past Surgical History:  Procedure Laterality Date   BASAL CELL CARCINOMA EXCISION  2012   Facial        [place]   COLONOSCOPY  11/13/2010   single polyp; repeatin 5 years; [place]   COLONOSCOPY N/A 11/04/2021   Procedure: COLONOSCOPY;  Surgeon: Maryruth Ole DASEN, MD;  Location: [facility] ENDOSCOPY;  Service: Endoscopy;  Laterality: N/A;  REQUEST EARLY TIME   COLONOSCOPY WITH PROPOFOL  N/A 12/13/2015   Procedure: COLONOSCOPY WITH PROPOFOL ;  Surgeon: Lamar DASEN Viktoria, MD;  Location: [facility] ENDOSCOPY;  Service: Endoscopy;  Laterality: N/A;   ear surgery  05/2012   [name].  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  Benign.   HEMORRHOID SURGERY  1978   [place]   LAPAROSCOPIC CHOLECYSTECTOMY  1982   rotator cuff surgery Right 05/14/2016   VASECTOMY  1978    SOCIAL HISTORY: Social History    Socioeconomic History   Marital status: Married    Spouse name: Heron   Number of children: 2   Years of education: 2 years college   Highest education level: Not on file  Occupational History   Occupation: retired    Comment: postal service in 2005  Tobacco Use   Smoking status: Never   Smokeless tobacco: Never  Vaping Use   Vaping status: Never Used  Substance and Sexual Activity   Alcohol use: Not Currently    Alcohol/week: 5.0 standard drinks of alcohol    Types: 5 Cans of beer per week    Comment: weekends beer, 4- 6 total Cleotilde Mallory   Drug use: No   Sexual activity: Not Currently    Partners: Female  Other Topics Concern   Not on file  Social History Narrative   Always uses seat belts. Smoke alarm and carbon monoxide detector in the home. Guns in the home stored in locked cabinet.Caffeine use: Coffee 2 servings per day, moderate amount. Exercise: Moderate walking 2 - 4 miles daily, 2 - 3 days per week.    Marital status:  Married x 47 years, happily.      Children:  2 children; 1 grandchild (20).      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      Tobacco: never      Alcohol:  Weekends; beer 4-6 per weekend.      Exercise:  Walking every other day 2.5 miles three times per day.      Seatbelt: 100%; no texting      Guns: secured guns.      Living Will: completed in 2014.  FULL CODE but no prolonged measures.        ADLs: independent with ADLs; no assistant devices   Social Drivers of Health   Financial Resource Strain: Not on file  Food Insecurity: No Food Insecurity (11/19/2023)   Hunger Vital Sign    Worried About Running Out of Food in the Last Year: Never true    Ran Out of Food in the Last Year: Never true  Transportation Needs: No Transportation Needs (11/19/2023)   PRAPARE - Administrator, Civil Service (Medical): No    Lack of Transportation (Non-Medical): No  Physical Activity: Not on file  Stress: Not on file  Social  Connections: Unknown (11/19/2023)   Social Connection and Isolation Panel    Frequency of Communication with Friends and Family: Three times a week    Frequency of Social Gatherings with Friends and Family: Three times a week    Attends Religious Services: Patient declined    Active Member of Clubs or Organizations: Patient declined    Attends Banker Meetings: Patient declined    Marital Status: Married  Catering manager Violence: Patient Declined (11/19/2023)   Humiliation, Afraid, Rape, and Kick questionnaire    Fear of Current or Ex-Partner: Patient declined    Emotionally Abused: Patient declined    Physically Abused: Patient declined    Sexually Abused: Patient declined    FAMILY HISTORY: Family History  Problem Relation Age of Onset   Heart disease Mother        first MI  in 93s   Diabetes Mother    Hyperlipidemia Mother    Arthritis Mother        rheumatoid   Cancer Father        lung   Diabetes Father    Arthritis Sister    Hyperlipidemia Sister    Hyperlipidemia Sister    Hypertension Sister    Hyperlipidemia Sister    Kidney disease Neg Hx    Prostate cancer Neg Hx    Colon cancer Neg Hx     ALLERGIES:  is allergic to codeine.  MEDICATIONS:  Current Outpatient Medications  Medication Sig Dispense Refill   acetaminophen  (TYLENOL ) 325 MG tablet Take 2 tablets (650 mg total) by mouth every 6 (six) hours as needed for mild pain (pain score 1-3), fever, headache or moderate pain (pain score 4-6) (or Fever >/= 101).     albuterol  (VENTOLIN  HFA) 108 (90 Base) MCG/ACT inhaler Inhale 1-2 puffs into the lungs every 6 (six) hours as needed. 18 g 0   cyclobenzaprine  (FLEXERIL ) 10 MG tablet Take 1 tablet (10 mg total) by mouth 3 (three) times daily as needed for muscle spasms. 30 tablet 2   fentaNYL  (DURAGESIC ) 25 MCG/HR Place 1 patch onto the skin every 3 (three) days. 10 patch 0   ondansetron  (ZOFRAN ) 4 MG tablet Take 1 tablet (4 mg total) by mouth every 8  (eight) hours as needed for nausea or vomiting. 30 tablet 0   oxyCODONE -acetaminophen  (PERCOCET) 5-325 MG tablet Take 1 tablet by mouth every 6 (six) hours as needed for severe pain (pain score 7-10). Must last 30 days. 90 tablet 0   polyethylene glycol (MIRALAX ) 17 g packet Take 17 g by mouth daily as needed. 30 each 0   predniSONE  (DELTASONE ) 20 MG tablet Take 3 pills a day for 1 week; 2 pills a day 1 week; 1 pill a day for 1 week and stop. 45 tablet 0   rosuvastatin  (CRESTOR ) 5 MG tablet Take 1 tablet (5 mg total) by mouth daily. 90 tablet 1   Cyanocobalamin  (VITAMIN B 12) 500 MCG TABS Take 100 mg by mouth daily at 6 (six) AM. (Patient not taking: Reported on 11/27/2023)     fluticasone  (FLONASE ) 50 MCG/ACT nasal spray Place 2 sprays into the nose. (Patient not taking: Reported on 11/27/2023)     gabapentin  (NEURONTIN ) 300 MG capsule Take 1 capsule (300 mg total) by mouth at bedtime for 7 days, THEN 1 capsule (300 mg total) 2 (two) times daily. (Patient not taking: No sig reported) 73 capsule 0   lisinopril  (ZESTRIL ) 40 MG tablet Take 0.5 tablets (20 mg total) by mouth daily. (Patient not taking: Reported on 11/27/2023)     Multiple Vitamins-Minerals (MULTIVITAMIN PO) Take by mouth daily. (Patient not taking: Reported on 11/27/2023)     vitamin E  400 UNIT capsule Take 400 Units by mouth daily. (Patient not taking: Reported on 11/27/2023)     No current facility-administered medications for this visit.    PHYSICAL EXAMINATION:   Vitals:   11/27/23 1305  BP: 115/73  Pulse: (!) 113  Resp: 18  Temp: (!) 96.6 F (35.9 C)  SpO2: 97%   Filed Weights   11/27/23 1305  Weight: 198 lb 11.2 oz (90.1 kg)   Patient is wheezing bilaterally.  Physical Exam Vitals and nursing note reviewed.  HENT:     Head: Normocephalic and atraumatic.     Mouth/Throat:     Pharynx: Oropharynx is clear.  Eyes:  Extraocular Movements: Extraocular movements intact.     Pupils: Pupils are equal, round, and reactive  to light.  Cardiovascular:     Rate and Rhythm: Normal rate and regular rhythm.  Pulmonary:     Comments: Decreased breath sounds bilaterally.  Abdominal:     Palpations: Abdomen is soft.  Musculoskeletal:        General: Normal range of motion.     Cervical back: Normal range of motion.  Skin:    General: Skin is warm.  Neurological:     General: No focal deficit present.     Mental Status: He is alert and oriented to person, place, and time.  Psychiatric:        Behavior: Behavior normal.        Judgment: Judgment normal.     LABORATORY DATA:  I have reviewed the data as listed Lab Results  Component Value Date   WBC 10.3 11/24/2023   HGB 10.2 (L) 11/24/2023   HCT 31.4 (L) 11/24/2023   MCV 97.8 11/24/2023   PLT 366 11/24/2023   Recent Labs    03/31/23 0930 10/08/23 0900 11/04/23 0846 11/12/23 0900 11/19/23 0957 11/19/23 1347 11/20/23 0511 11/21/23 0244  NA 143   < > 132*   < > 133*  --  137 134*  K 4.9   < > 5.7*   < > 4.1  --  4.2 4.0  CL 103   < > 94*   < > 97*  --  101 100  CO2 26   < > 19*   < > 22  --  25 25  GLUCOSE 103*   < > 111*   < > 114*  --  90 100*  BUN 11   < > 18   < > 18  --  16 20  CREATININE 1.21   < > 1.68*   < > 1.46*  --  1.30* 1.12  CALCIUM  10.0   < > 11.5*   < > 10.8*  --  9.8 10.4*  GFRNONAA  --    < >  --   --  50*  --  57* >60  PROT 7.4   < > 7.2  --  7.3  --   --  6.3*  ALBUMIN 4.7   < > 4.1  --  3.6  --   --  3.1*  AST 34   < > 40  --  39  --   --  70*  ALT 38   < > 41  --  29  --   --  105*  ALKPHOS 52   < > 223*  --  173*  --   --  222*  BILITOT 1.7*  1.9*   < > 1.8*  --  2.6* 2.5*  --  1.3*  BILIDIR 0.40  --   --   --   --  0.3*  --   --   IBILI 1.30*  --   --   --   --  2.2*  --   --    < > = values in this interval not displayed.    RADIOGRAPHIC STUDIES: I have personally reviewed the radiological images as listed and agreed with the findings in the report. CT BIOPSY Result Date: 11/24/2023 CLINICAL DATA:  Right hilar  lung mass with lymphadenopathy and multiple bone lesions including the left iliac bone and soft tissue masses involving the left iliopsoas and gluteal musculature by MRI. EXAM:  CT GUIDED CORE BIOPSY OF LEFT GLUTEAL SOFT TISSUE MASS ANESTHESIA/SEDATION: Moderate (conscious) sedation was employed during this procedure. A total of Versed  2.0 mg and Fentanyl  150 mcg was administered intravenously. Moderate Sedation Time: 17 minutes. The patient's level of consciousness and vital signs were monitored continuously by radiology nursing throughout the procedure under my direct supervision. PROCEDURE: The procedure risks, benefits, and alternatives were explained to the patient. Questions regarding the procedure were encouraged and answered. The patient understands and consents to the procedure. A time out was performed prior to initiating the procedure. RADIATION DOSE REDUCTION: This exam was performed according to the departmental dose-optimization program which includes automated exposure control, adjustment of the mA and/or kV according to patient size and/or use of iterative reconstruction technique. The left pelvic region was prepped with chlorhexidine  in a sterile fashion, and a sterile drape was applied covering the operative field. A sterile gown and sterile gloves were used for the procedure. Local anesthesia was provided with 1% Lidocaine . CT of the pelvis was performed in a supine position. Under CT guidance, a 17 gauge trocar needle was advanced to the level of a lateral left soft tissue mass deep to the gluteal musculature and abutting the left iliac bone. After confirming needle tip position, 3 separate coaxial 18 gauge core biopsy samples were obtained and submitted in formalin. Some Gel-Foam pledgets were advanced through the outer needle prior to its removal. COMPLICATIONS: None FINDINGS: Smooth, rounded configuration mass extending lateral to the left iliac bone and ileum measures up to approximately 4.1 x  7.2 cm in maximum transverse dimensions by unenhanced CT and lies deep to the gluteus minimus muscle. Biopsy was performed yielding solid tissue. IMPRESSION: CT-guided biopsy of left gluteal region soft tissue mass deep to the gluteus minimus muscle measuring just over approximately 7 cm in diameter by CT. Electronically Signed   By: Marcey Moan M.D.   On: 11/24/2023 13:39   MR PELVIS W WO CONTRAST Result Date: 11/21/2023 CLINICAL DATA:  Right hilar mass with osseous metastatic disease, including pelvic tumor EXAM: MRI PELVIS WITHOUT AND WITH CONTRAST TECHNIQUE: Multiplanar multisequence MR imaging of the pelvis was performed both before and after administration of intravenous contrast. CONTRAST:  9mL GADAVIST  GADOBUTROL  1 MMOL/ML IV SOLN COMPARISON:  Lumbar MRI 11/19/2023 FINDINGS: OSSEOUS STRUCTURES AND JOINTS: Substantial metastatic burden to the bilateral iliac bones, right ischium, bilateral posterior acetabular walls, left pubic bone,, central and right sacrum, bilateral femoral neck, and bilateral intertrochanteric region. In particular, tumor in the proximal femurs predisposes the patient to pathologic fracture and special cautions with weight-bearing activities is recommended. Regarding the dominant left iliac lesion, there is extensive extraosseous extension of tumor both anteriorly along the iliopsoas and upper pelvic sidewall; posteriorly along the gluteus minimus and gluteus medius, and even posteriorly along the lower paraspinal and medial left gluteus maximus musculature. A small amount of tumor is present along the left sciatic notch but does not visibly impinge on the left sciatic nerve. Likewise there is extraosseous extension of tumor anteriorly associated with the right sacral lesion, partially tracking along the upper margin of the right piriformis muscle. Mild extraosseous extension of tumor anteriorly and posteriorly from the right iliac lesion is shown on image 20 of series 14, tracking  along the right iliopsoas and right gluteus minimus. There is tumor along the attachment site of the right rectus femoris muscle, which could predispose to avulsion. MUSCULOTENDINOUS: Muscular involvement by extraosseous extension of tumor as noted above. No separate purely muscular metastatic  lesion identified. There is muscular edema involving portions of the bilateral iliopsoas musculature as well as left greater than right gluteal musculature and hip adductor musculature, likely related to the above described tumors. SACRAL PLEXUS: Tumor from the left iliac bone is tangential to components of the left sacral plexus example on image 19 of series 8 but does not demonstrably involve the sacral plexus at this time. OTHER: Scattered small pelvic lymph nodes, one of the largest is a 0.9 cm left iliac node on image 17 series 14. Mild presacral edema likely related to the sacral metastatic lesion. Mildly prominent median lobe of prostate gland indents the bladder base. Mildly fatty left spermatic cord. IMPRESSION: 1. Substantial metastatic burden to the bilateral iliac bones, right ischium, bilateral posterior acetabular walls, left pubic bone, central and right sacrum, bilateral femoral neck, and bilateral intertrochanteric region. In particular, tumor in the proximal femurs predisposes the patient to pathologic fracture and special cautions with weight-bearing activities is recommended. 2. Extraosseous extension of tumor from the dominant left iliac lesion, tracking along the left iliopsoas and upper pelvic sidewall; posteriorly along the gluteus minimus and gluteus medius, and even posteriorly along the lower paraspinal and medial left gluteus maximus musculature. 3. Extraosseous extension of tumor from the right iliac lesion, tracking along the right iliopsoas and right gluteus minimus. 4. Tumor along the attachment site of the right rectus femoris muscle, which could predispose to avulsion. 5. Scattered small  pelvic lymph nodes, one of the largest is a 0.9 cm left iliac node. 6. Mildly prominent median lobe of prostate gland indents the bladder base. 7. Mildly fatty left spermatic cord. Electronically Signed   By: Ryan Salvage M.D.   On: 11/21/2023 12:09   MR Lumbar Spine W Wo Contrast Result Date: 11/19/2023 CLINICAL DATA:  Low back pain, cancer suspected EXAM: MRI LUMBAR SPINE WITHOUT AND WITH CONTRAST TECHNIQUE: Multiplanar and multiecho pulse sequences of the lumbar spine were obtained without and with intravenous contrast. CONTRAST:  9mL GADAVIST  GADOBUTROL  1 MMOL/ML IV SOLN COMPARISON:  None Available. FINDINGS: Segmentation:  Standard. Alignment:  Physiologic. Vertebrae: T1 hypointense, stir hyperintense, and enhancing lesions at L2 and involving the partially imaged sacral vertebral bodies and sacrum, compatible with metastases. Conus medullaris and cauda equina: Conus extends to the L1 level. Conus and cauda equina appear normal. Paraspinal and other soft tissues: Partially imaged suspected extraosseous extension of tumor involving the pelvis on the left and possibly the right. Disc levels: T12-L1: No significant disc protrusion, foraminal stenosis, or canal stenosis. L1-L2: No significant disc protrusion, foraminal stenosis, or canal stenosis. L2-L3: No significant disc protrusion, foraminal stenosis, or canal stenosis. L3-L4: Mild facet arthropathy.  No significant stenosis. L4-L5: Moderate facet arthropathy and mild disc bulging. Mild left foraminal stenosis. Patent canal. L5-S1: Moderate bilateral facet arthropathy and mild disc bulging. Mild right foraminal stenosis. Pain canal. IMPRESSION: 1. 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 2. Partially imaged suspected extraosseous extension of tumor involving the pelvis on the left and potentially the right. Recommend MRI of the pelvis with contrast for further evaluation. Electronically Signed    By: Gilmore GORMAN Molt M.D.   On: 11/19/2023 23:53   CT CHEST WO CONTRAST Result Date: 11/19/2023 CLINICAL DATA:  Generalized weakness. Dyspnea. Concern for lung cancer. * Tracking Code: BO * EXAM: CT CHEST WITHOUT CONTRAST TECHNIQUE: Multidetector CT imaging of the chest was performed following the standard protocol without IV contrast. RADIATION DOSE REDUCTION: This exam was performed  according to the departmental dose-optimization program which includes automated exposure control, adjustment of the mA and/or kV according to patient size and/or use of iterative reconstruction technique. COMPARISON:  Plain film of earlier today.  No prior CT FINDINGS: Cardiovascular: Aortic atherosclerosis. Tortuous thoracic aorta. Normal heart size, without pericardial effusion. Left main and 3 vessel coronary artery calcification. Mediastinum/Nodes: No supraclavicular adenopathy. Subcarinal nodal mass measures 1.6 x 3.2 cm on 69/3. Right hilar nodal mass is poorly delineated secondary to noncontrast technique. Estimated at 3.2 x 2.8 cm on 68/3, causing endobronchial compression as detailed below. Lungs/Pleura: No pleural fluid. Endobronchial marked narrowing, near obstruction involving the bronchus intermedius including on 70/4. More mild narrowing of the proximal right middle and right lower lobe bronchi. Minimal exclusion of the inferior most right lung base. 1.1 x 1.3 cm soft tissue density within the anterior most right lower lobe is favored to be pulmonary parenchymal nodule over an infrahilar node. Example on image 68/4 Scattered calcified granulomas. Upper Abdomen: Nonspecific caudate lobe enlargement. Cholecystectomy. Normal imaged portions of the spleen, stomach, pancreas, adrenal glands, left kidney. Musculoskeletal: Mid and lower thoracic spondylosis. IMPRESSION: 1. 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 The dominant  right hilar nodal mass causes narrowing of the bronchus intermedius. Recommend multidisciplinary thoracic oncology consultation for eventual PET and tissue sampling. Alternatively, sampling via bronchoscopy could be performed. 2. 1.3 cm soft tissue density along the anterior most right lower lobe is favored to represent a parenchymal pulmonary nodule and is most likely the site of primary. An enlarged infrahilar node could look similar. 3. Incidental findings, including: Coronary artery atherosclerosis. Aortic Atherosclerosis (ICD10-I70.0). Electronically Signed   By: Rockey Kilts M.D.   On: 11/19/2023 12:44   DG Chest 2 View Result Date: 11/19/2023 CLINICAL DATA:  weakness EXAM: CHEST - 2 VIEW COMPARISON:  October 16, 2023 FINDINGS: No focal airspace consolidation, pleural effusion, or pneumothorax. No cardiomegaly. Tortuous aorta with aortic atherosclerosis. No acute fracture or destructive lesions. Multilevel thoracic osteophytosis. IMPRESSION: No acute cardiopulmonary abnormality. Electronically Signed   By: Rogelia Myers M.D.   On: 11/19/2023 11:29   US  Abdomen Limited RUQ (LIVER/GB) Result Date: 11/04/2023 CLINICAL DATA:  Elevated bilirubin and alkaline phosphatase EXAM: ULTRASOUND ABDOMEN LIMITED RIGHT UPPER QUADRANT COMPARISON:  None available FINDINGS: Gallbladder: Surgically absent Common bile duct: Diameter: 5 mm Liver: Parenchymal echogenicity: Diffusely increased Contours: Normal Lesions: None Portal vein: Patent.  Hepatopetal flow Other: None. IMPRESSION: Diffuse increased echogenicity of the hepatic parenchyma is a nonspecific indicator of hepatocellular dysfunction, most commonly steatosis. Electronically Signed   By: Aliene Lloyd M.D.   On: 11/04/2023 10:23     Cancer, metastatic to bone [facility]) # JULY [id number]- 78 year old male patient non-smoker and no prior history of malignancy recently admitted to hospital for hypercalcemia/renal insufficiency-worsening back pain- CT chest- Right hilar  mass/mediastinal adenopathy.  MRI lumbar spine shows-L2 lesion.  MRI of the pelvis- Substantial metastatic burden to the bilateral iliac bones, right ischium, bilateral posterior acetabular walls, left pubic bone, central and right sacrum, bilateral femoral neck, and bilateral intertrochanteric region. In particular, tumor in the proximal femurs  Extraosseous extension of tumor from the dominant left iliac lesion, tracking along the left iliopsoas and upper pelvic sidewall; posteriorly along the gluteus minimus and gluteus medius, and even posteriorly along the lower paraspinal and medial left gluteus maximus musculature; Extraosseous extension of tumor from the right iliac lesion, tracking along the right iliopsoas and right gluteus minimus.  Tumor along the attachment site of the right rectus femoris muscle, which could predispose to avulsion. Scattered small pelvic lymph nodes; Mildly prominent median lobe of prostate gland indents the bladder base.  # After discussion with pulmonary [ Dr. Hardy- ] and IR patient underwent biopsy of the pelvic mass.  Pathology is pending-IHC stains pending.  I have reached out to the pathologist awaiting a callback.  Will review at tumor conference.  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  Discussed regarding potential side effects of narcotics including but not limited to dizziness and constipation.  Given the concern for predisposition of patient to pathologic fracture-recommend evaluation with radiation oncology ASAP.  Continue conservative measures like walking with a walker.  Consider evaluation at [facility] orthopedics for stabilization.  # Wheezing-reactive airway/cough-recommend prednisone .  Prescription sent.   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  Calcitriol  level  198-question etiology recommend holding any supplemental calcium  plus vitamin D.  # Elevated AST ALT-question etiology.  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  Elevated alkaline phosphatase likely due to bone mets.   # IV access: Poor IV access recommend Mediport placement.  # DISPOSITION: # refer to Dr.Chrystal re: pelvic mass # referral to IR re; port placement # follow up TBD- Dr.B      Above plan of care was discussed with patient/family in detail.  My contact information was given to the patient/family.       Cindy JONELLE Joe, MD 11/27/2023 1:48 PM

## 2023-11-27 NOTE — Patient Instructions (Signed)
#   HOLD cholesterol medication/rosuvastatin   # Hold blood pressure medication-

## 2023-11-27 NOTE — Progress Notes (Signed)
 Patient is a hospital follow up/new patient.

## 2023-11-27 NOTE — Assessment & Plan Note (Addendum)
#   JULY 7620- 78 year old male patient non-smoker and no prior history of malignancy recently admitted to hospital for hypercalcemia/renal insufficiency-worsening back pain- CT chest- Right hilar mass/mediastinal adenopathy.  MRI lumbar spine shows-L2 lesion.  MRI of the pelvis- Substantial metastatic burden to the bilateral iliac bones, right ischium, bilateral posterior acetabular walls, left pubic bone, central and right sacrum, bilateral femoral neck, and bilateral intertrochanteric region. In particular, tumor in the proximal femurs  Extraosseous extension of tumor from the dominant left iliac lesion, tracking along the left iliopsoas and upper pelvic sidewall; posteriorly along the gluteus minimus and gluteus medius, and even posteriorly along the lower paraspinal and medial left gluteus maximus musculature; Extraosseous extension of tumor from the right iliac lesion, tracking along the right iliopsoas and right gluteus minimus. Tumor along the attachment site of the right rectus femoris muscle, which could predispose to avulsion. Scattered small pelvic lymph nodes; Mildly prominent median lobe of prostate gland indents the bladder base.  # After discussion with pulmonary [ Dr. Hardy- ] and IR patient underwent biopsy of the pelvic mass.  Pathology is pending-IHC stains pending.  I have reached out to the pathologist awaiting a callback.  Will review at tumor conference.  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  Discussed regarding potential side effects of narcotics including but not limited to dizziness and constipation.  Given the concern for predisposition of patient to pathologic fracture-recommend evaluation with radiation oncology ASAP.  Continue conservative measures like walking with a walker.  Consider evaluation at [facility] orthopedics for stabilization.  #  Wheezing-reactive airway/cough-recommend prednisone .  Prescription sent.   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  Calcitriol  level 198-question etiology recommend holding any supplemental calcium  plus vitamin D.  # Elevated AST ALT-question etiology.  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  Elevated alkaline phosphatase likely due to bone mets.   # IV access: Poor IV access recommend Mediport placement.  # DISPOSITION: # refer to Dr.Chrystal re: pelvic mass # referral to IR re; port placement # follow up TBD- Dr.B

## 2023-11-28 LAB — COMPLIANCE DRUG ANALYSIS, UR

## 2023-11-30 ENCOUNTER — Telehealth: Payer: Self-pay

## 2023-11-30 ENCOUNTER — Other Ambulatory Visit: Payer: Self-pay | Admitting: Radiology

## 2023-11-30 ENCOUNTER — Telehealth: Payer: Self-pay | Admitting: *Deleted

## 2023-11-30 NOTE — Telephone Encounter (Signed)
 Called and reviewed port a cath insertion details. All questions answered.  Port a cath placement scheduled for December 04, 2023  Arrive at 0730  for 0830 appointment Come into the Heart and Vascular entrance at [facility]. This entrance is located in the front of the hospital. Do not eat or drink anything after midnight Take your regularly scheduled blood pressure, pain, or seizure medication. You do not need to hold you blood thinners. You will need a driver for this procedure.

## 2023-11-30 NOTE — Telephone Encounter (Signed)
 Need prior authorization on fentanyl  per [facility] pharmacy.

## 2023-12-01 ENCOUNTER — Telehealth: Payer: Self-pay | Admitting: Internal Medicine

## 2023-12-01 ENCOUNTER — Encounter: Payer: Self-pay | Admitting: Radiation Oncology

## 2023-12-01 ENCOUNTER — Inpatient Hospital Stay

## 2023-12-01 ENCOUNTER — Other Ambulatory Visit: Payer: Self-pay | Admitting: *Deleted

## 2023-12-01 ENCOUNTER — Other Ambulatory Visit: Payer: Self-pay

## 2023-12-01 ENCOUNTER — Ambulatory Visit

## 2023-12-01 ENCOUNTER — Encounter: Payer: Self-pay | Admitting: Internal Medicine

## 2023-12-01 ENCOUNTER — Ambulatory Visit
Admission: RE | Admit: 2023-12-01 | Discharge: 2023-12-01 | Disposition: A | Discharge status: Home / Self Care | Source: Ambulatory Visit | Attending: Radiation Oncology | Admitting: Radiation Oncology

## 2023-12-01 ENCOUNTER — Inpatient Hospital Stay: Admitting: Internal Medicine

## 2023-12-01 VITALS — BP 114/76 | HR 110 | Temp 96.7°F | Resp 18 | Ht 72.0 in | Wt 197.8 lb

## 2023-12-01 VITALS — BP 106/73 | HR 120 | Temp 97.8°F | Resp 16

## 2023-12-01 DIAGNOSIS — Z79899 Other long term (current) drug therapy: Secondary | ICD-10-CM | POA: Insufficient documentation

## 2023-12-01 DIAGNOSIS — C8333 Diffuse large B-cell lymphoma, intra-abdominal lymph nodes: Secondary | ICD-10-CM

## 2023-12-01 DIAGNOSIS — C7951 Secondary malignant neoplasm of bone: Secondary | ICD-10-CM | POA: Diagnosis not present

## 2023-12-01 DIAGNOSIS — M899 Disorder of bone, unspecified: Secondary | ICD-10-CM | POA: Insufficient documentation

## 2023-12-01 DIAGNOSIS — R918 Other nonspecific abnormal finding of lung field: Secondary | ICD-10-CM | POA: Insufficient documentation

## 2023-12-01 DIAGNOSIS — Z5112 Encounter for antineoplastic immunotherapy: Secondary | ICD-10-CM | POA: Diagnosis not present

## 2023-12-01 DIAGNOSIS — C8330 Diffuse large B-cell lymphoma, unspecified site: Secondary | ICD-10-CM | POA: Insufficient documentation

## 2023-12-01 DIAGNOSIS — E7800 Pure hypercholesterolemia, unspecified: Secondary | ICD-10-CM | POA: Insufficient documentation

## 2023-12-01 DIAGNOSIS — R17 Unspecified jaundice: Secondary | ICD-10-CM

## 2023-12-01 DIAGNOSIS — I10 Essential (primary) hypertension: Secondary | ICD-10-CM | POA: Insufficient documentation

## 2023-12-01 DIAGNOSIS — Z860100 Personal history of colon polyps, unspecified: Secondary | ICD-10-CM | POA: Insufficient documentation

## 2023-12-01 DIAGNOSIS — Z8719 Personal history of other diseases of the digestive system: Secondary | ICD-10-CM | POA: Insufficient documentation

## 2023-12-01 DIAGNOSIS — Z9221 Personal history of antineoplastic chemotherapy: Secondary | ICD-10-CM | POA: Diagnosis not present

## 2023-12-01 LAB — CBC WITH DIFFERENTIAL (CANCER CENTER ONLY)
Abs Immature Granulocytes: 0.21 K/uL — ABNORMAL HIGH (ref 0.00–0.07)
Basophils Absolute: 0 K/uL (ref 0.0–0.1)
Basophils Relative: 0 %
Eosinophils Absolute: 0 K/uL (ref 0.0–0.5)
Eosinophils Relative: 0 %
HCT: 30.8 % — ABNORMAL LOW (ref 39.0–52.0)
Hemoglobin: 10.4 g/dL — ABNORMAL LOW (ref 13.0–17.0)
Immature Granulocytes: 1 %
Lymphocytes Relative: 8 %
Lymphs Abs: 1.8 K/uL (ref 0.7–4.0)
MCH: 32 pg (ref 26.0–34.0)
MCHC: 33.8 g/dL (ref 30.0–36.0)
MCV: 94.8 fL (ref 80.0–100.0)
Monocytes Absolute: 1 K/uL (ref 0.1–1.0)
Monocytes Relative: 5 %
Neutro Abs: 19.5 K/uL — ABNORMAL HIGH (ref 1.7–7.7)
Neutrophils Relative %: 86 %
Platelet Count: 440 K/uL — ABNORMAL HIGH (ref 150–400)
RBC: 3.25 MIL/uL — ABNORMAL LOW (ref 4.22–5.81)
RDW: 14.1 % (ref 11.5–15.5)
WBC Count: 22.6 K/uL — ABNORMAL HIGH (ref 4.0–10.5)
nRBC: 0 % (ref 0.0–0.2)

## 2023-12-01 LAB — CMP (CANCER CENTER ONLY)
ALT: 50 U/L — ABNORMAL HIGH (ref 0–44)
AST: 42 U/L — ABNORMAL HIGH (ref 15–41)
Albumin: 3.8 g/dL (ref 3.5–5.0)
Alkaline Phosphatase: 291 U/L — ABNORMAL HIGH (ref 38–126)
Anion gap: 11 (ref 5–15)
BUN: 26 mg/dL — ABNORMAL HIGH (ref 8–23)
CO2: 20 mmol/L — ABNORMAL LOW (ref 22–32)
Calcium: 9.1 mg/dL (ref 8.9–10.3)
Chloride: 100 mmol/L (ref 98–111)
Creatinine: 1.64 mg/dL — ABNORMAL HIGH (ref 0.61–1.24)
GFR, Estimated: 43 mL/min — ABNORMAL LOW (ref >60)
Glucose, Bld: 141 mg/dL — ABNORMAL HIGH (ref 70–99)
Potassium: 4.3 mmol/L (ref 3.5–5.1)
Sodium: 131 mmol/L — ABNORMAL LOW (ref 135–145)
Total Bilirubin: 1.7 mg/dL — ABNORMAL HIGH (ref 0.0–1.2)
Total Protein: 7.6 g/dL (ref 6.5–8.1)

## 2023-12-01 LAB — HEPATITIS PANEL, ACUTE
HCV Ab: NONREACTIVE
Hep A IgM: NONREACTIVE
Hep B C IgM: NONREACTIVE
Hepatitis B Surface Ag: NONREACTIVE

## 2023-12-01 LAB — SURGICAL PATHOLOGY

## 2023-12-01 LAB — LACTATE DEHYDROGENASE: LDH: 710 U/L — ABNORMAL HIGH (ref 98–192)

## 2023-12-01 MED ORDER — SODIUM CHLORIDE 0.9 % IV SOLN
Freq: Once | INTRAVENOUS | Status: DC
  Filled 2023-12-01: qty 250

## 2023-12-01 NOTE — Progress Notes (Signed)
START ON PATHWAY REGIMEN - Lymphoma and CLL     Cycles 1 through 6: A cycle is every 21 days:     Prednisone      Rituximab-xxxx      Polatuzumab vedotin-piiq      Cyclophosphamide      Doxorubicin      Pegfilgrastim-xxxx    Cycles 7 and 8: A cycle is every 21 days:     Rituximab-xxxx   **Always confirm dose/schedule in your pharmacy ordering system**  Patient Characteristics: Diffuse Large B-Cell Lymphoma or Follicular Lymphoma, Grade 3B, First Line, Stage III and IV Disease Type: Not Applicable Disease Type: Diffuse Large B-Cell Lymphoma Disease Type: Not Applicable Line of therapy: First Line Intent of Therapy: Curative Intent, Discussed with Patient 

## 2023-12-01 NOTE — Telephone Encounter (Signed)
 I spoke to patient regarding results of the biopsy suggestive of lymphoma recommend urgent evaluation follow-up in the clinic today.

## 2023-12-01 NOTE — Assessment & Plan Note (Addendum)
#   JULY 2025- CT chest- Right hilar mass/mediastinal adenopathy.  MRI lumbar spine shows-L2 lesion.  MRI of the pelvis- Substantial metastatic burden to the bilateral iliac bones, right ischium, bilateral posterior acetabular walls, left pubic bone, central and right sacrum, bilateral femoral neck, and bilateral intertrochanteric region. In particular, tumor in the proximal femurs  Extraosseous extension of tumor from the dominant left iliac lesion, tracking along the left iliopsoas and upper pelvic sidewall; posteriorly along the gluteus minimus and gluteus medius, and even posteriorly along the lower paraspinal and medial left gluteus maximus musculature; Extraosseous extension of tumor from the right iliac lesion, tracking along the right iliopsoas and right gluteus minimus. Tumor along the attachment site of the right rectus femoris muscle, which could predispose to avulsion. Scattered small pelvic lymph nodes; Mildly prominent median lobe of prostate gland indents the bladder base.  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   KI-67 IS ESSENTIALLY 100%. TO EXCLUDE A SO-CALLED HIGH-GRADE B-CELL  LYMPHOMA/DOUBLE HIT LYMPHOMA FISH IS PENDING.  # PET scan pending on August 11. MUGA SCAN- 8/19- # Check uric acid; check hepatitis panel.      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  Discussed therapies cure. I would do interim scan after 2-3 cycles of chemo.  I discussed at length regarding the individual drugs in POLA R-CHP.  Discussed the potential side effects including but not limited to-increasing fatigue, nausea vomiting, diarrhea, hair loss, sores in the mouth, increase risk of infection and also  neuropathy. Also discussed regarding potential side effects of heart failure/leukemia with Adriamycin. # Check uric acid; check hepatitis panel.      # ID prophylaxis-recommend acyclovir 400 twice daily; Bactrim  Monday Wednesday Friday.  Tumor lysis prophylaxis-allopurinol 300 mg a day   # Lower back pain-secondary to presumed malignancy involving the L2/sacrum-started patient on fentanyl  patch 12.5 mcg; continue Percocet 1 pill every 6 hours as needed- s/p evaluation with radiation oncology- however given Dx of lymphoma recommend holding off radiation. Proceed with chemo ASAP. Discussed with Dr.Chrystal.    # Wheezing-reactive airway/cough- improved on  prednisone .     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    # Elevated AST ALT-question etiology.  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  Elevated alkaline phosphatase likely due to bone mets- check UGTA-1     # IV access: awaiting- Mediport placement.  # Chemotherapy education/nutrition evaluation: port placement. Plan start chemotherapy Sent the scripts for antiemetics-Zofran  and Compazine; EMLA cream sent to pharmacy   # DISPOSITION: # MUGA scan ASAP # Recommend IVFs- 1 lit today-  # refer to Chemo education ASAP- re: chemo- POLA- RCHP # 8/14- MD; labs-  labs- cbc/cmp; uric acid; LDH; chemo-rituxan-  # 8/20- MD; labs-cbc/cmp; uric acid; LDH;  8-21- chemo POLAR-CHP--8-23-injection- Dr.B  # 40 minutes face-to-face with the patient discussing the above plan of care; more than 50% of time spent on prognosis/ natural history; counseling and coordination.

## 2023-12-01 NOTE — Progress Notes (Signed)
 Here for bx results.

## 2023-12-01 NOTE — Consult Note (Signed)
 NEW PATIENT EVALUATION  Name: Ruben Garcia  MRN: [id number]  Date:   12/01/2023     DOB: 24-Jun-1945   This 78 y.o. 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  REFERRING PHYSICIAN: Myrla Jon HERO, MD  CHIEF COMPLAINT:  Chief Complaint  Patient presents with   Bone Mets    DIAGNOSIS: The encounter diagnosis was Cancer, metastatic to bone [facility]).   PREVIOUS INVESTIGATIONS:  MRI of pelvis and CT scan of chest reviewed PET scan ordered Pathology pending Clinical notes reviewed  HPI: Patient is a 78 year old male who presented to the ER and was admitted to the hospital recently with worsening lower back pain.  He had developed a cough and some increased shortness of breath over the past several weeks.  Chest CT demonstrated lower thoracic and right hilar infiltrative adenopathy favoring nodal metastasis from primary bronchogenic carcinoma likely small cell.  Also has 1.3 cm soft tissue density along the anteriormost right lower lobe favoring to represent possible parenchymal pulmonary primary.  MRI scan of his lumbar spine shows findings compatible with osseous metastatic disease at L2 and involving the imaged sacrum.  There is also extension of tumor involving the pelvis on the left and potentially the right.  MRI of his pelvis showed substantial metastatic disease with bilateral iliac bones right ischium and bilateral posterior acetabular walls left pubic bone central and right sacrum bilateral femoral neck.  He has had a CT-guided biopsy of pelvic mass although pathology is pending.  He is ambulating although is in significant pain on his left side.  He is wheelchair-bound today.  He has a slight nonproductive cough no hemoptysis.  PLANNED TREATMENT REGIMEN: Palliative radiation therapy to possible elbow or L-spine as well as pelvis.  PAST MEDICAL HISTORY:  has a past medical history of Allergic rhinitis, cause  unspecified, Basal cell carcinoma of skin, site unspecified (04/28/2010), BPH (benign prostatic hypertrophy), Colon polyps, Essential hypertension, benign, Hemorrhoids, internal, Incomplete bladder emptying, Other abnormal glucose, Over weight, Personal history of colonic polyps, Personal history of other genital system and obstetric disorders(V13.29), Pure hypercholesterolemia, Unspecified disorder of kidney and ureter, and Unspecified disorder of liver.    PAST SURGICAL HISTORY:  Past Surgical History:  Procedure Laterality Date   BASAL CELL CARCINOMA EXCISION  2012   Facial        [place]   COLONOSCOPY  11/13/2010   single polyp; repeatin 5 years; [place]   COLONOSCOPY N/A 11/04/2021   Procedure: COLONOSCOPY;  Surgeon: Maryruth Ole DASEN, MD;  Location: [facility] ENDOSCOPY;  Service: Endoscopy;  Laterality: N/A;  REQUEST EARLY TIME   COLONOSCOPY WITH PROPOFOL  N/A 12/13/2015   Procedure: COLONOSCOPY WITH PROPOFOL ;  Surgeon: Lamar DASEN Viktoria, MD;  Location: [facility] ENDOSCOPY;  Service: Endoscopy;  Laterality: N/A;   ear surgery  05/2012   [name].  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  Benign.   HEMORRHOID SURGERY  1978   [place]   LAPAROSCOPIC CHOLECYSTECTOMY  1982   rotator cuff surgery Right 05/14/2016   VASECTOMY  1978    FAMILY HISTORY: family history includes Arthritis in his mother and sister; Cancer in his father; Diabetes in his father and mother; Heart disease in his mother; Hyperlipidemia in his mother, sister, sister, and sister; Hypertension in his sister.  SOCIAL HISTORY:  reports that he has never smoked. He has never used smokeless tobacco. He reports that he does not currently use alcohol after a past usage of about 5.0 standard  drinks of alcohol per week. He reports that he does not use drugs.  ALLERGIES: Codeine  MEDICATIONS:  Current Outpatient Medications  Medication Sig Dispense Refill   acetaminophen  (TYLENOL ) 325 MG tablet Take 2 tablets (650 mg total) by mouth every 6 (six) hours as  needed for mild pain (pain score 1-3), fever, headache or moderate pain (pain score 4-6) (or Fever >/= 101).     Cyanocobalamin  (VITAMIN B 12) 500 MCG TABS Take 100 mg by mouth daily at 6 (six) AM.     cyclobenzaprine  (FLEXERIL ) 10 MG tablet Take 1 tablet (10 mg total) by mouth 3 (three) times daily as needed for muscle spasms. 30 tablet 2   fentaNYL  (DURAGESIC ) 25 MCG/HR Place 1 patch onto the skin every 3 (three) days. 10 patch 0   fluticasone  (FLONASE ) 50 MCG/ACT nasal spray Place 2 sprays into the nose.     gabapentin  (NEURONTIN ) 300 MG capsule Take 1 capsule (300 mg total) by mouth at bedtime for 7 days, THEN 1 capsule (300 mg total) 2 (two) times daily. 73 capsule 0   lisinopril  (ZESTRIL ) 40 MG tablet Take 0.5 tablets (20 mg total) by mouth daily.     Multiple Vitamins-Minerals (MULTIVITAMIN PO) Take by mouth daily.     ondansetron  (ZOFRAN ) 4 MG tablet Take 1 tablet (4 mg total) by mouth every 8 (eight) hours as needed for nausea or vomiting. 30 tablet 0   oxyCODONE -acetaminophen  (PERCOCET) 5-325 MG tablet Take 1 tablet by mouth every 6 (six) hours as needed for severe pain (pain score 7-10). Must last 30 days. 90 tablet 0   polyethylene glycol (MIRALAX ) 17 g packet Take 17 g by mouth daily as needed. 30 each 0   predniSONE  (DELTASONE ) 20 MG tablet Take 3 pills a day for 1 week; 2 pills a day 1 week; 1 pill a day for 1 week and stop. 45 tablet 0   rosuvastatin  (CRESTOR ) 5 MG tablet Take 1 tablet (5 mg total) by mouth daily. 90 tablet 1   vitamin E  400 UNIT capsule Take 400 Units by mouth daily.     albuterol  (VENTOLIN  HFA) 108 (90 Base) MCG/ACT inhaler Inhale 1-2 puffs into the lungs every 6 (six) hours as needed. 18 g 0   No current facility-administered medications for this encounter.    ECOG PERFORMANCE STATUS:  2 - Symptomatic, <50% confined to bed  REVIEW OF SYSTEMS: Patient denies any weight loss, fatigue, weakness, fever, chills or night sweats. Patient denies any loss of vision,  blurred vision. Patient denies any ringing  of the ears or hearing loss. No irregular heartbeat. Patient denies heart murmur or history of fainting. Patient denies any chest pain or pain radiating to her upper extremities. Patient denies any shortness of breath, difficulty breathing at night, cough or hemoptysis. Patient denies any swelling in the lower legs. Patient denies any nausea vomiting, vomiting of blood, or coffee ground material in the vomitus. Patient denies any stomach pain. Patient states has had normal bowel movements no significant constipation or diarrhea. Patient denies any dysuria, hematuria or significant nocturia. Patient denies any problems walking, swelling in the joints or loss of balance. Patient denies any skin changes, loss of hair or loss of weight. Patient denies any excessive worrying or anxiety or significant depression. Patient denies any problems with insomnia. Patient denies excessive thirst, polyuria, polydipsia. Patient denies any swollen glands, patient denies easy bruising or easy bleeding. Patient denies any recent infections, allergies or URI. Patient s visual fields have  not changed significantly in recent time.   PHYSICAL EXAM: BP 106/73   Pulse (!) 120   Temp 97.8 F (36.6 C) (Tympanic)   Resp 16  Range of motion of his lower extremities does not elicit pain.  Motor and sensory levels are equal and symmetric in the lower extremities proprioception is intact.  Well-developed well-nourished patient in NAD. HEENT reveals PERLA, EOMI, discs not visualized.  Oral cavity is clear. No oral mucosal lesions are identified. Neck is clear without evidence of cervical or supraclavicular adenopathy. Lungs show some expiratory wheezes bilaterally.. Cardiac examination is essentially unremarkable with regular rate and rhythm without murmur rub or thrill. Abdomen is benign with no organomegaly or masses noted. Motor sensory and DTR levels are equal and symmetric in the upper and  lower extremities. Cranial nerves II through XII are grossly intact. Proprioception is intact. No peripheral adenopathy or edema is identified. No motor or sensory levels are noted. Crude visual fields are within normal range.  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  We have expedited that and will be performed this Friday and I will see him early next week for simulation.  Will target areas of hypermetabolic activity on PET scan and cover as much metastatic disease is possible.  Would plan on delivering 30 Gray in 10 fractions.  Risks and benefits of treated including possible increased lower Neri tract symptoms diarrhea fatigue alteration blood counts skin reaction all were reviewed with the patient.  He comprehends my treatment plan well.  There is tight constriction of his right mainstem takeoff palliative radiation therapy may be indicated should that progress prior to initiating systemic treatment.  I would like to take this opportunity to thank you for allowing me to participate in the care of your patient.SABRA Marcey Penton, MD

## 2023-12-01 NOTE — Progress Notes (Signed)
 [facility] CONSULT NOTE  Patient Care Team: [name], Jon HERO, MD as PCP - General (Family Medicine) Verdene Gills, RN as Oncology Nurse Navigator [name], MD as Consulting Physician (Oncology)  CHIEF COMPLAINTS/PURPOSE OF CONSULTATION: Lung mass/pelvic masses concerning for cancer  Oncology History  DLBCL (diffuse large B cell lymphoma) (HCC)  12/01/2023 Initial Diagnosis   DLBCL (diffuse large B cell lymphoma) (HCC)   12/01/2023 Cancer Staging   Staging form: Hodgkin and Non-Hodgkin Lymphoma, AJCC 8th Edition - Clinical: Stage IV (Diffuse large B-cell lymphoma) - Signed by Makylee Sanborn R, MD on 12/01/2023   12/10/2023 -  Chemotherapy   Patient is on Treatment Plan : NON-HODGKINS LYMPHOMA POLA-R-CHP D1 X 6 cycles / Rituximab D1 q21d X 2 cycles       HISTORY OF PRESENTING ILLNESS: Patient ambulating-in wheel chair. accompanied by wife.   Ruben Garcia 78 y.o.  male pleasant patient with a new diagnosis of cancer is here to review the biopsy results.  Patient states his pain is better controlled on prednisone .  His wheezing also improved.  He continues to have Percocet.  Currently status post evaluation radiation oncology.  Otherwise complains of poor appetite.  No nausea no vomiting.  Review of Systems  Constitutional:  Positive for malaise/fatigue and weight loss. Negative for chills, diaphoresis and fever.  HENT:  Negative for nosebleeds and sore throat.   Eyes:  Negative for double vision.  Respiratory:  Positive for cough. Negative for hemoptysis, sputum production, shortness of breath and wheezing.   Cardiovascular:  Negative for chest pain, palpitations, orthopnea and leg swelling.  Gastrointestinal:  Negative for abdominal pain, blood in stool, constipation, diarrhea, heartburn, melena, nausea and vomiting.  Genitourinary:  Negative for dysuria, frequency and urgency.  Musculoskeletal:  Positive for back pain, joint pain and myalgias.   Skin: Negative.  Negative for itching and rash.  Neurological:  Negative for dizziness, tingling, focal weakness, weakness and headaches.  Endo/Heme/Allergies:  Does not bruise/bleed easily.  Psychiatric/Behavioral:  Negative for depression. The patient is not nervous/anxious and does not have insomnia.     MEDICAL HISTORY:  Past Medical History:  Diagnosis Date   Allergic rhinitis, cause unspecified    Basal cell carcinoma of skin, site unspecified 04/28/2010   Nasal; [place].   BPH (benign prostatic hypertrophy)    Colon polyps    Essential hypertension, benign    Hemorrhoids, internal    Incomplete bladder emptying    Other abnormal glucose    Over weight    Personal history of colonic polyps    Personal history of other genital system and obstetric disorders(V13.29)    Pure hypercholesterolemia    Unspecified disorder of kidney and ureter    Unspecified disorder of liver     SURGICAL HISTORY: Past Surgical History:  Procedure Laterality Date   BASAL CELL CARCINOMA EXCISION  2012   Facial        [place]   COLONOSCOPY  11/13/2010   single polyp; repeatin 5 years; [place]   COLONOSCOPY N/A 11/04/2021   Procedure: COLONOSCOPY;  Surgeon: Maryruth Ole DASEN, MD;  Location: [facility] ENDOSCOPY;  Service: Endoscopy;  Laterality: N/A;  REQUEST EARLY TIME   COLONOSCOPY WITH PROPOFOL  N/A 12/13/2015   Procedure: COLONOSCOPY WITH PROPOFOL ;  Surgeon: Lamar DASEN Viktoria, MD;  Location: [facility] ENDOSCOPY;  Service: Endoscopy;  Laterality: N/A;   ear surgery  05/2012   [name].  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  Benign.   HEMORRHOID SURGERY  1978   [place]  LAPAROSCOPIC CHOLECYSTECTOMY  1982   rotator cuff surgery Right 05/14/2016   VASECTOMY  1978    SOCIAL HISTORY: Social History   Socioeconomic History   Marital status: Married    Spouse name: Heron   Number of children: 2   Years of education: 2 years college   Highest education level: Not on file  Occupational History   Occupation:  retired    Comment: postal service in 2005  Tobacco Use   Smoking status: Never   Smokeless tobacco: Never  Vaping Use   Vaping status: Never Used  Substance and Sexual Activity   Alcohol use: Not Currently    Alcohol/week: 5.0 standard drinks of alcohol    Types: 5 Cans of beer per week    Comment: weekends beer, 4- 6 total Cleotilde Mallory   Drug use: No   Sexual activity: Not Currently    Partners: Female  Other Topics Concern   Not on file  Social History Narrative   Always uses seat belts. Smoke alarm and carbon monoxide detector in the home. Guns in the home stored in locked cabinet.Caffeine use: Coffee 2 servings per day, moderate amount. Exercise: Moderate walking 2 - 4 miles daily, 2 - 3 days per week.    Marital status:  Married x 47 years, happily.      Children:  2 children; 1 grandchild (20).      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      Tobacco: never      Alcohol:  Weekends; beer 4-6 per weekend.      Exercise:  Walking every other day 2.5 miles three times per day.      Seatbelt: 100%; no texting      Guns: secured guns.      Living Will: completed in 2014.  FULL CODE but no prolonged measures.        ADLs: independent with ADLs; no assistant devices   Social Drivers of Health   Financial Resource Strain: Not on file  Food Insecurity: No Food Insecurity (11/27/2023)   Hunger Vital Sign    Worried About Running Out of Food in the Last Year: Never true    Ran Out of Food in the Last Year: Never true  Transportation Needs: No Transportation Needs (11/27/2023)   PRAPARE - Administrator, Civil Service (Medical): No    Lack of Transportation (Non-Medical): No  Physical Activity: Not on file  Stress: Not on file  Social Connections: Unknown (11/19/2023)   Social Connection and Isolation Panel    Frequency of Communication with Friends and Family: Three times a week    Frequency of Social Gatherings with Friends and Family: Three times a  week    Attends Religious Services: Patient declined    Active Member of Clubs or Organizations: Patient declined    Attends Banker Meetings: Patient declined    Marital Status: Married  Catering manager Violence: Not At Risk (11/27/2023)   Humiliation, Afraid, Rape, and Kick questionnaire    Fear of Current or Ex-Partner: No    Emotionally Abused: No    Physically Abused: No    Sexually Abused: No    FAMILY HISTORY: Family History  Problem Relation Age of Onset   Heart disease Mother        first MI in 61s   Diabetes Mother    Hyperlipidemia Mother    Arthritis Mother        rheumatoid  Cancer Father        lung   Diabetes Father    Arthritis Sister    Hyperlipidemia Sister    Hyperlipidemia Sister    Hypertension Sister    Hyperlipidemia Sister    Kidney disease Neg Hx    Prostate cancer Neg Hx    Colon cancer Neg Hx     ALLERGIES:  is allergic to codeine.  MEDICATIONS:  Current Outpatient Medications  Medication Sig Dispense Refill   acetaminophen  (TYLENOL ) 325 MG tablet Take 2 tablets (650 mg total) by mouth every 6 (six) hours as needed for mild pain (pain score 1-3), fever, headache or moderate pain (pain score 4-6) (or Fever >/= 101).     albuterol  (VENTOLIN  HFA) 108 (90 Base) MCG/ACT inhaler Inhale 1-2 puffs into the lungs every 6 (six) hours as needed. 18 g 0   Cyanocobalamin  (VITAMIN B 12) 500 MCG TABS Take 100 mg by mouth daily at 6 (six) AM.     cyclobenzaprine  (FLEXERIL ) 10 MG tablet Take 1 tablet (10 mg total) by mouth 3 (three) times daily as needed for muscle spasms. 30 tablet 2   fentaNYL  (DURAGESIC ) 25 MCG/HR Place 1 patch onto the skin every 3 (three) days. 10 patch 0   fluticasone  (FLONASE ) 50 MCG/ACT nasal spray Place 2 sprays into the nose.     gabapentin  (NEURONTIN ) 300 MG capsule Take 1 capsule (300 mg total) by mouth at bedtime for 7 days, THEN 1 capsule (300 mg total) 2 (two) times daily. 73 capsule 0   lisinopril  (ZESTRIL ) 40 MG  tablet Take 0.5 tablets (20 mg total) by mouth daily.     Multiple Vitamins-Minerals (MULTIVITAMIN PO) Take by mouth daily.     ondansetron  (ZOFRAN ) 4 MG tablet Take 1 tablet (4 mg total) by mouth every 8 (eight) hours as needed for nausea or vomiting. 30 tablet 0   oxyCODONE -acetaminophen  (PERCOCET) 5-325 MG tablet Take 1 tablet by mouth every 6 (six) hours as needed for severe pain (pain score 7-10). Must last 30 days. 90 tablet 0   polyethylene glycol (MIRALAX ) 17 g packet Take 17 g by mouth daily as needed. 30 each 0   predniSONE  (DELTASONE ) 20 MG tablet Take 3 pills a day for 1 week; 2 pills a day 1 week; 1 pill a day for 1 week and stop. 45 tablet 0   rosuvastatin  (CRESTOR ) 5 MG tablet Take 1 tablet (5 mg total) by mouth daily. 90 tablet 1   vitamin E  400 UNIT capsule Take 400 Units by mouth daily.     No current facility-administered medications for this visit.   Facility-Administered Medications Ordered in Other Visits  Medication Dose Route Frequency Provider Last Rate Last Admin   0.9 %  sodium chloride  infusion   Intravenous Once Ruther Ephraim R, MD        PHYSICAL EXAMINATION:   Vitals:   12/01/23 1344  BP: 114/76  Pulse: (!) 110  Resp: 18  Temp: (!) 96.7 F (35.9 C)  SpO2: 98%   Filed Weights   12/01/23 1344  Weight: 197 lb 12.8 oz (89.7 kg)   Patient is wheezing bilaterally.  Physical Exam Vitals and nursing note reviewed.  HENT:     Head: Normocephalic and atraumatic.     Mouth/Throat:     Pharynx: Oropharynx is clear.  Eyes:     Extraocular Movements: Extraocular movements intact.     Pupils: Pupils are equal, round, and reactive to light.  Cardiovascular:  Rate and Rhythm: Normal rate and regular rhythm.  Pulmonary:     Comments: Decreased breath sounds bilaterally.  Abdominal:     Palpations: Abdomen is soft.  Musculoskeletal:        General: Normal range of motion.     Cervical back: Normal range of motion.  Skin:    General: Skin is  warm.  Neurological:     General: No focal deficit present.     Mental Status: He is alert and oriented to person, place, and time.  Psychiatric:        Behavior: Behavior normal.        Judgment: Judgment normal.     LABORATORY DATA:  I have reviewed the data as listed Lab Results  Component Value Date   WBC 22.6 (H) 12/01/2023   HGB 10.4 (L) 12/01/2023   HCT 30.8 (L) 12/01/2023   MCV 94.8 12/01/2023   PLT 440 (H) 12/01/2023   Recent Labs    03/31/23 0930 10/08/23 0900 11/19/23 0957 11/19/23 1347 11/20/23 0511 11/21/23 0244 12/01/23 1351  NA 143   < > 133*  --  137 134* 131*  K 4.9   < > 4.1  --  4.2 4.0 4.3  CL 103   < > 97*  --  101 100 100  CO2 26   < > 22  --  25 25 20*  GLUCOSE 103*   < > 114*  --  90 100* 141*  BUN 11   < > 18  --  16 20 26*  CREATININE 1.21   < > 1.46*  --  1.30* 1.12 1.64*  CALCIUM  10.0   < > 10.8*  --  9.8 10.4* 9.1  GFRNONAA  --    < > 50*  --  57* >60 43*  PROT 7.4   < > 7.3  --   --  6.3* 7.6  ALBUMIN 4.7   < > 3.6  --   --  3.1* 3.8  AST 34   < > 39  --   --  70* 42*  ALT 38   < > 29  --   --  105* 50*  ALKPHOS 52   < > 173*  --   --  222* 291*  BILITOT 1.7*  1.9*   < > 2.6* 2.5*  --  1.3* 1.7*  BILIDIR 0.40  --   --  0.3*  --   --   --   IBILI 1.30*  --   --  2.2*  --   --   --    < > = values in this interval not displayed.    RADIOGRAPHIC STUDIES: I have personally reviewed the radiological images as listed and agreed with the findings in the report. CT BIOPSY Result Date: 11/24/2023 CLINICAL DATA:  Right hilar lung mass with lymphadenopathy and multiple bone lesions including the left iliac bone and soft tissue masses involving the left iliopsoas and gluteal musculature by MRI. EXAM: CT GUIDED CORE BIOPSY OF LEFT GLUTEAL SOFT TISSUE MASS ANESTHESIA/SEDATION: Moderate (conscious) sedation was employed during this procedure. A total of Versed  2.0 mg and Fentanyl  150 mcg was administered intravenously. Moderate Sedation Time: 17  minutes. The patient's level of consciousness and vital signs were monitored continuously by radiology nursing throughout the procedure under my direct supervision. PROCEDURE: The procedure risks, benefits, and alternatives were explained to the patient. Questions regarding the procedure were encouraged and answered. The patient understands and consents to the  procedure. A time out was performed prior to initiating the procedure. RADIATION DOSE REDUCTION: This exam was performed according to the departmental dose-optimization program which includes automated exposure control, adjustment of the mA and/or kV according to patient size and/or use of iterative reconstruction technique. The left pelvic region was prepped with chlorhexidine  in a sterile fashion, and a sterile drape was applied covering the operative field. A sterile gown and sterile gloves were used for the procedure. Local anesthesia was provided with 1% Lidocaine . CT of the pelvis was performed in a supine position. Under CT guidance, a 17 gauge trocar needle was advanced to the level of a lateral left soft tissue mass deep to the gluteal musculature and abutting the left iliac bone. After confirming needle tip position, 3 separate coaxial 18 gauge core biopsy samples were obtained and submitted in formalin. Some Gel-Foam pledgets were advanced through the outer needle prior to its removal. COMPLICATIONS: None FINDINGS: Smooth, rounded configuration mass extending lateral to the left iliac bone and ileum measures up to approximately 4.1 x 7.2 cm in maximum transverse dimensions by unenhanced CT and lies deep to the gluteus minimus muscle. Biopsy was performed yielding solid tissue. IMPRESSION: CT-guided biopsy of left gluteal region soft tissue mass deep to the gluteus minimus muscle measuring just over approximately 7 cm in diameter by CT. Electronically Signed   By: Marcey Moan M.D.   On: 11/24/2023 13:39   MR PELVIS W WO CONTRAST Result Date:  11/21/2023 CLINICAL DATA:  Right hilar mass with osseous metastatic disease, including pelvic tumor EXAM: MRI PELVIS WITHOUT AND WITH CONTRAST TECHNIQUE: Multiplanar multisequence MR imaging of the pelvis was performed both before and after administration of intravenous contrast. CONTRAST:  9mL GADAVIST  GADOBUTROL  1 MMOL/ML IV SOLN COMPARISON:  Lumbar MRI 11/19/2023 FINDINGS: OSSEOUS STRUCTURES AND JOINTS: Substantial metastatic burden to the bilateral iliac bones, right ischium, bilateral posterior acetabular walls, left pubic bone,, central and right sacrum, bilateral femoral neck, and bilateral intertrochanteric region. In particular, tumor in the proximal femurs predisposes the patient to pathologic fracture and special cautions with weight-bearing activities is recommended. Regarding the dominant left iliac lesion, there is extensive extraosseous extension of tumor both anteriorly along the iliopsoas and upper pelvic sidewall; posteriorly along the gluteus minimus and gluteus medius, and even posteriorly along the lower paraspinal and medial left gluteus maximus musculature. A small amount of tumor is present along the left sciatic notch but does not visibly impinge on the left sciatic nerve. Likewise there is extraosseous extension of tumor anteriorly associated with the right sacral lesion, partially tracking along the upper margin of the right piriformis muscle. Mild extraosseous extension of tumor anteriorly and posteriorly from the right iliac lesion is shown on image 20 of series 14, tracking along the right iliopsoas and right gluteus minimus. There is tumor along the attachment site of the right rectus femoris muscle, which could predispose to avulsion. MUSCULOTENDINOUS: Muscular involvement by extraosseous extension of tumor as noted above. No separate purely muscular metastatic lesion identified. There is muscular edema involving portions of the bilateral iliopsoas musculature as well as left greater  than right gluteal musculature and hip adductor musculature, likely related to the above described tumors. SACRAL PLEXUS: Tumor from the left iliac bone is tangential to components of the left sacral plexus example on image 19 of series 8 but does not demonstrably involve the sacral plexus at this time. OTHER: Scattered small pelvic lymph nodes, one of the largest is a 0.9 cm left iliac node  on image 17 series 14. Mild presacral edema likely related to the sacral metastatic lesion. Mildly prominent median lobe of prostate gland indents the bladder base. Mildly fatty left spermatic cord. IMPRESSION: 1. Substantial metastatic burden to the bilateral iliac bones, right ischium, bilateral posterior acetabular walls, left pubic bone, central and right sacrum, bilateral femoral neck, and bilateral intertrochanteric region. In particular, tumor in the proximal femurs predisposes the patient to pathologic fracture and special cautions with weight-bearing activities is recommended. 2. Extraosseous extension of tumor from the dominant left iliac lesion, tracking along the left iliopsoas and upper pelvic sidewall; posteriorly along the gluteus minimus and gluteus medius, and even posteriorly along the lower paraspinal and medial left gluteus maximus musculature. 3. Extraosseous extension of tumor from the right iliac lesion, tracking along the right iliopsoas and right gluteus minimus. 4. Tumor along the attachment site of the right rectus femoris muscle, which could predispose to avulsion. 5. Scattered small pelvic lymph nodes, one of the largest is a 0.9 cm left iliac node. 6. Mildly prominent median lobe of prostate gland indents the bladder base. 7. Mildly fatty left spermatic cord. Electronically Signed   By: Ryan Salvage M.D.   On: 11/21/2023 12:09   MR Lumbar Spine W Wo Contrast Result Date: 11/19/2023 CLINICAL DATA:  Low back pain, cancer suspected EXAM: MRI LUMBAR SPINE WITHOUT AND WITH CONTRAST TECHNIQUE:  Multiplanar and multiecho pulse sequences of the lumbar spine were obtained without and with intravenous contrast. CONTRAST:  9mL GADAVIST  GADOBUTROL  1 MMOL/ML IV SOLN COMPARISON:  None Available. FINDINGS: Segmentation:  Standard. Alignment:  Physiologic. Vertebrae: T1 hypointense, stir hyperintense, and enhancing lesions at L2 and involving the partially imaged sacral vertebral bodies and sacrum, compatible with metastases. Conus medullaris and cauda equina: Conus extends to the L1 level. Conus and cauda equina appear normal. Paraspinal and other soft tissues: Partially imaged suspected extraosseous extension of tumor involving the pelvis on the left and possibly the right. Disc levels: T12-L1: No significant disc protrusion, foraminal stenosis, or canal stenosis. L1-L2: No significant disc protrusion, foraminal stenosis, or canal stenosis. L2-L3: No significant disc protrusion, foraminal stenosis, or canal stenosis. L3-L4: Mild facet arthropathy.  No significant stenosis. L4-L5: Moderate facet arthropathy and mild disc bulging. Mild left foraminal stenosis. Patent canal. L5-S1: Moderate bilateral facet arthropathy and mild disc bulging. Mild right foraminal stenosis. Pain canal. IMPRESSION: 1. 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 2. Partially imaged suspected extraosseous extension of tumor involving the pelvis on the left and potentially the right. Recommend MRI of the pelvis with contrast for further evaluation. Electronically Signed   By: Gilmore GORMAN Molt M.D.   On: 11/19/2023 23:53   CT CHEST WO CONTRAST Result Date: 11/19/2023 CLINICAL DATA:  Generalized weakness. Dyspnea. Concern for lung cancer. * Tracking Code: BO * EXAM: CT CHEST WITHOUT CONTRAST TECHNIQUE: Multidetector CT imaging of the chest was performed following the standard protocol without IV contrast. RADIATION DOSE REDUCTION: This exam was performed according to the  departmental dose-optimization program which includes automated exposure control, adjustment of the mA and/or kV according to patient size and/or use of iterative reconstruction technique. COMPARISON:  Plain film of earlier today.  No prior CT FINDINGS: Cardiovascular: Aortic atherosclerosis. Tortuous thoracic aorta. Normal heart size, without pericardial effusion. Left main and 3 vessel coronary artery calcification. Mediastinum/Nodes: No supraclavicular adenopathy. Subcarinal nodal mass measures 1.6 x 3.2 cm on 69/3. Right hilar nodal mass is poorly delineated secondary to noncontrast technique. Estimated  at 3.2 x 2.8 cm on 68/3, causing endobronchial compression as detailed below. Lungs/Pleura: No pleural fluid. Endobronchial marked narrowing, near obstruction involving the bronchus intermedius including on 70/4. More mild narrowing of the proximal right middle and right lower lobe bronchi. Minimal exclusion of the inferior most right lung base. 1.1 x 1.3 cm soft tissue density within the anterior most right lower lobe is favored to be pulmonary parenchymal nodule over an infrahilar node. Example on image 68/4 Scattered calcified granulomas. Upper Abdomen: Nonspecific caudate lobe enlargement. Cholecystectomy. Normal imaged portions of the spleen, stomach, pancreas, adrenal glands, left kidney. Musculoskeletal: Mid and lower thoracic spondylosis. IMPRESSION: 1. 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 The dominant right hilar nodal mass causes narrowing of the bronchus intermedius. Recommend multidisciplinary thoracic oncology consultation for eventual PET and tissue sampling. Alternatively, sampling via bronchoscopy could be performed. 2. 1.3 cm soft tissue density along the anterior most right lower lobe is favored to represent a parenchymal pulmonary nodule and is most likely the site of primary. An enlarged  infrahilar node could look similar. 3. Incidental findings, including: Coronary artery atherosclerosis. Aortic Atherosclerosis (ICD10-I70.0). Electronically Signed   By: Rockey Kilts M.D.   On: 11/19/2023 12:Garcia   DG Chest 2 View Result Date: 11/19/2023 CLINICAL DATA:  weakness EXAM: CHEST - 2 VIEW COMPARISON:  October 16, 2023 FINDINGS: No focal airspace consolidation, pleural effusion, or pneumothorax. No cardiomegaly. Tortuous aorta with aortic atherosclerosis. No acute fracture or destructive lesions. Multilevel thoracic osteophytosis. IMPRESSION: No acute cardiopulmonary abnormality. Electronically Signed   By: Rogelia Myers M.D.   On: 11/19/2023 11:29   US  Abdomen Limited RUQ (LIVER/GB) Result Date: 11/04/2023 CLINICAL DATA:  Elevated bilirubin and alkaline phosphatase EXAM: ULTRASOUND ABDOMEN LIMITED RIGHT UPPER QUADRANT COMPARISON:  None available FINDINGS: Gallbladder: Surgically absent Common bile duct: Diameter: 5 mm Liver: Parenchymal echogenicity: Diffusely increased Contours: Normal Lesions: None Portal vein: Patent.  Hepatopetal flow Other: None. IMPRESSION: Diffuse increased echogenicity of the hepatic parenchyma is a nonspecific indicator of hepatocellular dysfunction, most commonly steatosis. Electronically Signed   By: Aliene Lloyd M.D.   On: 11/04/2023 10:23     DLBCL (diffuse large B cell lymphoma) (HCC)   # JULY 2025- CT chest- Right hilar mass/mediastinal adenopathy.  MRI lumbar spine shows-L2 lesion.  MRI of the pelvis- Substantial metastatic burden to the bilateral iliac bones, right ischium, bilateral posterior acetabular walls, left pubic bone, central and right sacrum, bilateral femoral neck, and bilateral intertrochanteric region. In particular, tumor in the proximal femurs  Extraosseous extension of tumor from the dominant left iliac lesion, tracking along the left iliopsoas and upper pelvic sidewall; posteriorly along the gluteus minimus and gluteus medius, and even posteriorly  along the lower paraspinal and medial left gluteus maximus musculature; Extraosseous extension of tumor from the right iliac lesion, tracking along the right iliopsoas and right gluteus minimus. Tumor along the attachment site of the right rectus femoris muscle, which could predispose to avulsion. Scattered small pelvic lymph nodes; Mildly prominent median lobe of prostate gland indents the bladder base.  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   KI-67 IS ESSENTIALLY 100%. TO EXCLUDE A SO-CALLED HIGH-GRADE B-CELL  LYMPHOMA/DOUBLE HIT LYMPHOMA FISH IS PENDING.  # PET scan pending on  August 11. MUGA SCAN- 8/19- # Check uric acid; check hepatitis panel.      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  Discussed therapies cure. I would do interim scan after 2-3 cycles of chemo.  I discussed at length regarding the individual drugs in POLA R-CHP.  Discussed the potential side effects including but not limited to-increasing fatigue, nausea vomiting, diarrhea, hair loss, sores in the mouth, increase risk of infection and also neuropathy. Also discussed regarding potential side effects of heart failure/leukemia with Adriamycin. # Check uric acid; check hepatitis panel.      # ID prophylaxis-recommend acyclovir 400 twice daily; Bactrim  Monday Wednesday Friday.  Tumor lysis prophylaxis-allopurinol 300 mg a day   # Lower back pain-secondary to presumed malignancy involving the L2/sacrum-started patient on fentanyl  patch 12.5 mcg; continue Percocet 1 pill every 6 hours as needed- s/p evaluation with radiation oncology- however given Dx of lymphoma recommend holding off radiation. Proceed with chemo ASAP.  Discussed with Dr.Chrystal.    # Wheezing-reactive airway/cough- improved on  prednisone .     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    # Elevated AST ALT-question etiology.  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  Elevated alkaline phosphatase likely due to bone mets- check UGTA-1     # IV access: awaiting- Mediport placement.  # Chemotherapy education/nutrition evaluation: port placement. Plan start chemotherapy Sent the scripts for antiemetics-Zofran  and Compazine; EMLA cream sent to pharmacy   # DISPOSITION: # MUGA scan ASAP # Recommend IVFs- 1 lit today-  # refer to Chemo education ASAP- re: chemo- POLA- RCHP # 8/14- MD; labs-  labs- cbc/cmp; uric acid; LDH; chemo-rituxan-  # 8/20- MD; labs-cbc/cmp; uric acid; LDH;  8-21- chemo POLAR-CHP--8-23-injection- Dr.B  # 40 minutes face-to-face with the patient discussing the above plan of care; more than 50% of time spent on prognosis/ natural history; counseling and coordination.    Above plan of care was discussed with patient/family in detail.  My contact information was given to the patient/family.     Cindy JONELLE Joe, MD 12/01/2023 4:58 PM

## 2023-12-01 NOTE — Progress Notes (Signed)
 Unable to get an IV attempted two times and pt did not want to try again. Notified Dr. Rennie and ok with not giving IVF today. Pt discharge at 1530 wvia w/C with wife.

## 2023-12-02 ENCOUNTER — Other Ambulatory Visit: Payer: Self-pay

## 2023-12-02 ENCOUNTER — Encounter: Payer: Self-pay | Admitting: Internal Medicine

## 2023-12-02 ENCOUNTER — Other Ambulatory Visit: Payer: Self-pay | Admitting: Internal Medicine

## 2023-12-02 LAB — PTH-RELATED PEPTIDE: PTH-related peptide: <2 pmol/L

## 2023-12-02 NOTE — H&P (Signed)
 Chief Complaint: Patient was seen in consultation today for diffuse large B cell lymphoma   Procedure: Port-a-catheter insertion  Referring Physician(s): [name]  Supervising Physician: Philip Cornet  Patient Status: [facility] - Out-pt  History of Present Illness: Ruben Garcia is a 78 y.o. male with a history of basal cell carcinoma, colon polyps, HTN, and recent diagnosis of diffuse large B cell lymphoma. Patient is known to IR from previous gluteal mass biopsy leading to aforementioned diagnosis. He follows with Dr. Rennie and has plans to undergo chemotherapy.  Referred to IR for port-a-catheter placement.   Patient is currently resting in bed with his wife at the bedside. He continues to have some left hip pain. States that he tolerated his last biopsy well and denies any post-procedure complication or concerns. He denies any fevers/chills, nausea/vomiting, chest pain, or changes in bowel/bladder habits. All questions and concerns answered at the bedside.   Code Status: Full Code  Past Medical History:  Diagnosis Date   Allergic rhinitis, cause unspecified    Basal cell carcinoma of skin, site unspecified 04/28/2010   Nasal; [place].   BPH (benign prostatic hypertrophy)    Colon polyps    Essential hypertension, benign    Hemorrhoids, internal    Incomplete bladder emptying    Other abnormal glucose    Over weight    Personal history of colonic polyps    Personal history of other genital system and obstetric disorders(V13.29)    Pure hypercholesterolemia    Unspecified disorder of kidney and ureter    Unspecified disorder of liver     Past Surgical History:  Procedure Laterality Date   BASAL CELL CARCINOMA EXCISION  2012   Facial        [place]   COLONOSCOPY  11/13/2010   single polyp; repeatin 5 years; [place]   COLONOSCOPY N/A 11/04/2021   Procedure: COLONOSCOPY;  Surgeon: Maryruth Ole DASEN, MD;  Location: [facility] ENDOSCOPY;  Service: Endoscopy;   Laterality: N/A;  REQUEST EARLY TIME   COLONOSCOPY WITH PROPOFOL  N/A 12/13/2015   Procedure: COLONOSCOPY WITH PROPOFOL ;  Surgeon: Lamar DASEN Viktoria, MD;  Location: [facility] ENDOSCOPY;  Service: Endoscopy;  Laterality: N/A;   ear surgery  05/2012   [name].  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  Benign.   HEMORRHOID SURGERY  1978   [place]   LAPAROSCOPIC CHOLECYSTECTOMY  1982   rotator cuff surgery Right 05/14/2016   VASECTOMY  1978    Allergies: Codeine  Medications: Prior to Admission medications   Medication Sig Start Date End Date Taking? Authorizing Provider  acetaminophen  (TYLENOL ) 325 MG tablet Take 2 tablets (650 mg total) by mouth every 6 (six) hours as needed for mild pain (pain score 1-3), fever, headache or moderate pain (pain score 4-6) (or Fever >/= 101). 11/21/23   Wieting, Richard, MD  albuterol  (VENTOLIN  HFA) 108 (90 Base) MCG/ACT inhaler Inhale 1-2 puffs into the lungs every 6 (six) hours as needed. 09/11/23   Corlis Burnard DEL, NP  Cyanocobalamin  (VITAMIN B 12) 500 MCG TABS Take 100 mg by mouth daily at 6 (six) AM.    [provider]  cyclobenzaprine  (FLEXERIL ) 10 MG tablet Take 1 tablet (10 mg total) by mouth 3 (three) times daily as needed for muscle spasms. 11/11/23   Gasper Nancyann BRAVO, MD  fentaNYL  (DURAGESIC ) 25 MCG/HR Place 1 patch onto the skin every 3 (three) days. 11/27/23   Brahmanday, Govinda R, MD  fluticasone  (FLONASE ) 50 MCG/ACT nasal spray Place 2 sprays into the nose. 02/21/21  [provider]  gabapentin  (NEURONTIN ) 300 MG capsule Take 1 capsule (300 mg total) by mouth at bedtime for 7 days, THEN 1 capsule (300 mg total) 2 (two) times daily. 11/25/23 01/04/24  Marcelino Nurse, MD  lisinopril  (ZESTRIL ) 40 MG tablet Take 0.5 tablets (20 mg total) by mouth daily. 11/11/23   Gasper Nancyann BRAVO, MD  Multiple Vitamins-Minerals (MULTIVITAMIN PO) Take by mouth daily.    [provider]  ondansetron  (ZOFRAN ) 4 MG tablet Take 1 tablet (4 mg total) by mouth every 8 (eight) hours  as needed for nausea or vomiting. 11/21/23   Josette Ade, MD  oxyCODONE -acetaminophen  (PERCOCET) 5-325 MG tablet Take 1 tablet by mouth every 6 (six) hours as needed for severe pain (pain score 7-10). Must last 30 days. 11/25/23 12/25/23  Lateef, Bilal, MD  polyethylene glycol (MIRALAX ) 17 g packet Take 17 g by mouth daily as needed. 11/21/23   Josette Ade, MD  predniSONE  (DELTASONE ) 20 MG tablet Take 3 pills a day for 1 week; 2 pills a day 1 week; 1 pill a day for 1 week and stop. 11/27/23   Brahmanday, Govinda R, MD  rosuvastatin  (CRESTOR ) 5 MG tablet Take 1 tablet (5 mg total) by mouth daily. 10/12/23   [name], Angela M, MD  vitamin E  400 UNIT capsule Take 400 Units by mouth daily.    [provider]     Family History  Problem Relation Age of Onset   Heart disease Mother        first MI in 74s   Diabetes Mother    Hyperlipidemia Mother    Arthritis Mother        rheumatoid   Cancer Father        lung   Diabetes Father    Arthritis Sister    Hyperlipidemia Sister    Hyperlipidemia Sister    Hypertension Sister    Hyperlipidemia Sister    Kidney disease Neg Hx    Prostate cancer Neg Hx    Colon cancer Neg Hx     Social History   Socioeconomic History   Marital status: Married    Spouse name: Heron   Number of children: 2   Years of education: 2 years college   Highest education level: Not on file  Occupational History   Occupation: retired    Comment: postal service in 2005  Tobacco Use   Smoking status: Never   Smokeless tobacco: Never  Vaping Use   Vaping status: Never Used  Substance and Sexual Activity   Alcohol use: Not Currently    Alcohol/week: 5.0 standard drinks of alcohol    Types: 5 Cans of beer per week    Comment: weekends beer, 4- 6 total Cleotilde Mallory   Drug use: No   Sexual activity: Not Currently    Partners: Female  Other Topics Concern   Not on file  Social History Narrative   Always uses seat belts. Smoke alarm and carbon  monoxide detector in the home. Guns in the home stored in locked cabinet.Caffeine use: Coffee 2 servings per day, moderate amount. Exercise: Moderate walking 2 - 4 miles daily, 2 - 3 days per week.    Marital status:  Married x 47 years, happily.      Children:  2 children; 1 grandchild (20).      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      Tobacco: never      Alcohol:  Weekends; beer 4-6 per  weekend.      Exercise:  Walking every other day 2.5 miles three times per day.      Seatbelt: 100%; no texting      Guns: secured guns.      Living Will: completed in 2014.  FULL CODE but no prolonged measures.        ADLs: independent with ADLs; no assistant devices   Social Drivers of Health   Financial Resource Strain: Not on file  Food Insecurity: No Food Insecurity (11/27/2023)   Hunger Vital Sign    Worried About Running Out of Food in the Last Year: Never true    Ran Out of Food in the Last Year: Never true  Transportation Needs: No Transportation Needs (11/27/2023)   PRAPARE - Administrator, Civil Service (Medical): No    Lack of Transportation (Non-Medical): No  Physical Activity: Not on file  Stress: Not on file  Social Connections: Unknown (11/19/2023)   Social Connection and Isolation Panel    Frequency of Communication with Friends and Family: Three times a week    Frequency of Social Gatherings with Friends and Family: Three times a week    Attends Religious Services: Patient declined    Active Member of Clubs or Organizations: Patient declined    Attends Banker Meetings: Patient declined    Marital Status: Married    Review of Systems  Musculoskeletal:  Positive for arthralgias (Left hip pain).  Denies any N/V, chest pain, shortness of breath, fevers/chills. All other ROS negative.  Vital Signs: BP (!) 159/99   Pulse 93   Temp (!) 97.4 F (36.3 C) (Temporal)   Resp 18   Ht 6' (1.829 m)   Wt 195 lb 8 oz (88.7 kg)   SpO2 96%    BMI 26.51 kg/m    Physical Exam Constitutional:      Appearance: Normal appearance.  HENT:     Mouth/Throat:     Mouth: Mucous membranes are moist.     Pharynx: Oropharynx is clear.  Cardiovascular:     Rate and Rhythm: Normal rate and regular rhythm.     Heart sounds: Normal heart sounds.  Pulmonary:     Effort: Pulmonary effort is normal.     Breath sounds: Normal breath sounds.  Abdominal:     General: Abdomen is flat.     Palpations: Abdomen is soft.     Tenderness: There is no abdominal tenderness.  Musculoskeletal:     Comments: Tenderness to left hip  Skin:    General: Skin is warm and dry.  Neurological:     Mental Status: He is alert and oriented to person, place, and time.  Psychiatric:        Mood and Affect: Mood normal.     Imaging: CT BIOPSY Result Date: 11/24/2023 CLINICAL DATA:  Right hilar lung mass with lymphadenopathy and multiple bone lesions including the left iliac bone and soft tissue masses involving the left iliopsoas and gluteal musculature by MRI. EXAM: CT GUIDED CORE BIOPSY OF LEFT GLUTEAL SOFT TISSUE MASS ANESTHESIA/SEDATION: Moderate (conscious) sedation was employed during this procedure. A total of Versed  2.0 mg and Fentanyl  150 mcg was administered intravenously. Moderate Sedation Time: 17 minutes. The patient's level of consciousness and vital signs were monitored continuously by radiology nursing throughout the procedure under my direct supervision. PROCEDURE: The procedure risks, benefits, and alternatives were explained to the patient. Questions regarding the procedure were encouraged and answered. The patient understands and consents  to the procedure. A time out was performed prior to initiating the procedure. RADIATION DOSE REDUCTION: This exam was performed according to the departmental dose-optimization program which includes automated exposure control, adjustment of the mA and/or kV according to patient size and/or use of iterative  reconstruction technique. The left pelvic region was prepped with chlorhexidine  in a sterile fashion, and a sterile drape was applied covering the operative field. A sterile gown and sterile gloves were used for the procedure. Local anesthesia was provided with 1% Lidocaine . CT of the pelvis was performed in a supine position. Under CT guidance, a 17 gauge trocar needle was advanced to the level of a lateral left soft tissue mass deep to the gluteal musculature and abutting the left iliac bone. After confirming needle tip position, 3 separate coaxial 18 gauge core biopsy samples were obtained and submitted in formalin. Some Gel-Foam pledgets were advanced through the outer needle prior to its removal. COMPLICATIONS: None FINDINGS: Smooth, rounded configuration mass extending lateral to the left iliac bone and ileum measures up to approximately 4.1 x 7.2 cm in maximum transverse dimensions by unenhanced CT and lies deep to the gluteus minimus muscle. Biopsy was performed yielding solid tissue. IMPRESSION: CT-guided biopsy of left gluteal region soft tissue mass deep to the gluteus minimus muscle measuring just over approximately 7 cm in diameter by CT. Electronically Signed   By: Marcey Moan M.D.   On: 11/24/2023 13:39   MR PELVIS W WO CONTRAST Result Date: 11/21/2023 CLINICAL DATA:  Right hilar mass with osseous metastatic disease, including pelvic tumor EXAM: MRI PELVIS WITHOUT AND WITH CONTRAST TECHNIQUE: Multiplanar multisequence MR imaging of the pelvis was performed both before and after administration of intravenous contrast. CONTRAST:  9mL GADAVIST  GADOBUTROL  1 MMOL/ML IV SOLN COMPARISON:  Lumbar MRI 11/19/2023 FINDINGS: OSSEOUS STRUCTURES AND JOINTS: Substantial metastatic burden to the bilateral iliac bones, right ischium, bilateral posterior acetabular walls, left pubic bone,, central and right sacrum, bilateral femoral neck, and bilateral intertrochanteric region. In particular, tumor in the  proximal femurs predisposes the patient to pathologic fracture and special cautions with weight-bearing activities is recommended. Regarding the dominant left iliac lesion, there is extensive extraosseous extension of tumor both anteriorly along the iliopsoas and upper pelvic sidewall; posteriorly along the gluteus minimus and gluteus medius, and even posteriorly along the lower paraspinal and medial left gluteus maximus musculature. A small amount of tumor is present along the left sciatic notch but does not visibly impinge on the left sciatic nerve. Likewise there is extraosseous extension of tumor anteriorly associated with the right sacral lesion, partially tracking along the upper margin of the right piriformis muscle. Mild extraosseous extension of tumor anteriorly and posteriorly from the right iliac lesion is shown on image 20 of series 14, tracking along the right iliopsoas and right gluteus minimus. There is tumor along the attachment site of the right rectus femoris muscle, which could predispose to avulsion. MUSCULOTENDINOUS: Muscular involvement by extraosseous extension of tumor as noted above. No separate purely muscular metastatic lesion identified. There is muscular edema involving portions of the bilateral iliopsoas musculature as well as left greater than right gluteal musculature and hip adductor musculature, likely related to the above described tumors. SACRAL PLEXUS: Tumor from the left iliac bone is tangential to components of the left sacral plexus example on image 19 of series 8 but does not demonstrably involve the sacral plexus at this time. OTHER: Scattered small pelvic lymph nodes, one of the largest is a 0.9 cm left  iliac node on image 17 series 14. Mild presacral edema likely related to the sacral metastatic lesion. Mildly prominent median lobe of prostate gland indents the bladder base. Mildly fatty left spermatic cord. IMPRESSION: 1. Substantial metastatic burden to the bilateral  iliac bones, right ischium, bilateral posterior acetabular walls, left pubic bone, central and right sacrum, bilateral femoral neck, and bilateral intertrochanteric region. In particular, tumor in the proximal femurs predisposes the patient to pathologic fracture and special cautions with weight-bearing activities is recommended. 2. Extraosseous extension of tumor from the dominant left iliac lesion, tracking along the left iliopsoas and upper pelvic sidewall; posteriorly along the gluteus minimus and gluteus medius, and even posteriorly along the lower paraspinal and medial left gluteus maximus musculature. 3. Extraosseous extension of tumor from the right iliac lesion, tracking along the right iliopsoas and right gluteus minimus. 4. Tumor along the attachment site of the right rectus femoris muscle, which could predispose to avulsion. 5. Scattered small pelvic lymph nodes, one of the largest is a 0.9 cm left iliac node. 6. Mildly prominent median lobe of prostate gland indents the bladder base. 7. Mildly fatty left spermatic cord. Electronically Signed   By: Ryan Salvage M.D.   On: 11/21/2023 12:09   MR Lumbar Spine W Wo Contrast Result Date: 11/19/2023 CLINICAL DATA:  Low back pain, cancer suspected EXAM: MRI LUMBAR SPINE WITHOUT AND WITH CONTRAST TECHNIQUE: Multiplanar and multiecho pulse sequences of the lumbar spine were obtained without and with intravenous contrast. CONTRAST:  9mL GADAVIST  GADOBUTROL  1 MMOL/ML IV SOLN COMPARISON:  None Available. FINDINGS: Segmentation:  Standard. Alignment:  Physiologic. Vertebrae: T1 hypointense, stir hyperintense, and enhancing lesions at L2 and involving the partially imaged sacral vertebral bodies and sacrum, compatible with metastases. Conus medullaris and cauda equina: Conus extends to the L1 level. Conus and cauda equina appear normal. Paraspinal and other soft tissues: Partially imaged suspected extraosseous extension of tumor involving the pelvis on the  left and possibly the right. Disc levels: T12-L1: No significant disc protrusion, foraminal stenosis, or canal stenosis. L1-L2: No significant disc protrusion, foraminal stenosis, or canal stenosis. L2-L3: No significant disc protrusion, foraminal stenosis, or canal stenosis. L3-L4: Mild facet arthropathy.  No significant stenosis. L4-L5: Moderate facet arthropathy and mild disc bulging. Mild left foraminal stenosis. Patent canal. L5-S1: Moderate bilateral facet arthropathy and mild disc bulging. Mild right foraminal stenosis. Pain canal. IMPRESSION: 1. 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 2. Partially imaged suspected extraosseous extension of tumor involving the pelvis on the left and potentially the right. Recommend MRI of the pelvis with contrast for further evaluation. Electronically Signed   By: Gilmore GORMAN Molt M.D.   On: 11/19/2023 23:53   CT CHEST WO CONTRAST Result Date: 11/19/2023 CLINICAL DATA:  Generalized weakness. Dyspnea. Concern for lung cancer. * Tracking Code: BO * EXAM: CT CHEST WITHOUT CONTRAST TECHNIQUE: Multidetector CT imaging of the chest was performed following the standard protocol without IV contrast. RADIATION DOSE REDUCTION: This exam was performed according to the departmental dose-optimization program which includes automated exposure control, adjustment of the mA and/or kV according to patient size and/or use of iterative reconstruction technique. COMPARISON:  Plain film of earlier today.  No prior CT FINDINGS: Cardiovascular: Aortic atherosclerosis. Tortuous thoracic aorta. Normal heart size, without pericardial effusion. Left main and 3 vessel coronary artery calcification. Mediastinum/Nodes: No supraclavicular adenopathy. Subcarinal nodal mass measures 1.6 x 3.2 cm on 69/3. Right hilar nodal mass is poorly delineated secondary to noncontrast  technique. Estimated at 3.2 x 2.8 cm on 68/3, causing endobronchial  compression as detailed below. Lungs/Pleura: No pleural fluid. Endobronchial marked narrowing, near obstruction involving the bronchus intermedius including on 70/4. More mild narrowing of the proximal right middle and right lower lobe bronchi. Minimal exclusion of the inferior most right lung base. 1.1 x 1.3 cm soft tissue density within the anterior most right lower lobe is favored to be pulmonary parenchymal nodule over an infrahilar node. Example on image 68/4 Scattered calcified granulomas. Upper Abdomen: Nonspecific caudate lobe enlargement. Cholecystectomy. Normal imaged portions of the spleen, stomach, pancreas, adrenal glands, left kidney. Musculoskeletal: Mid and lower thoracic spondylosis. IMPRESSION: 1. 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 The dominant right hilar nodal mass causes narrowing of the bronchus intermedius. Recommend multidisciplinary thoracic oncology consultation for eventual PET and tissue sampling. Alternatively, sampling via bronchoscopy could be performed. 2. 1.3 cm soft tissue density along the anterior most right lower lobe is favored to represent a parenchymal pulmonary nodule and is most likely the site of primary. An enlarged infrahilar node could look similar. 3. Incidental findings, including: Coronary artery atherosclerosis. Aortic Atherosclerosis (ICD10-I70.0). Electronically Signed   By: Rockey Kilts M.D.   On: 11/19/2023 12:44   DG Chest 2 View Result Date: 11/19/2023 CLINICAL DATA:  weakness EXAM: CHEST - 2 VIEW COMPARISON:  October 16, 2023 FINDINGS: No focal airspace consolidation, pleural effusion, or pneumothorax. No cardiomegaly. Tortuous aorta with aortic atherosclerosis. No acute fracture or destructive lesions. Multilevel thoracic osteophytosis. IMPRESSION: No acute cardiopulmonary abnormality. Electronically Signed   By: Rogelia Myers M.D.   On: 11/19/2023 11:29     Labs:  CBC: Recent Labs    11/19/23 0957 11/20/23 0511 11/24/23 1103 12/01/23 1351  WBC 8.5 7.8 10.3 22.6*  HGB 10.5* 8.9* 10.2* 10.4*  HCT 31.2* 27.0* 31.4* 30.8*  PLT 410* 340 366 440*    COAGS: Recent Labs    10/16/23 1330 11/24/23 1103  INR 1.0 1.0  APTT 33  --     BMP: Recent Labs    11/19/23 0957 11/20/23 0511 11/21/23 0244 12/01/23 1351  NA 133* 137 134* 131*  K 4.1 4.2 4.0 4.3  CL 97* 101 100 100  CO2 22 25 25  20*  GLUCOSE 114* 90 100* 141*  BUN 18 16 20  26*  CALCIUM  10.8* 9.8 10.4* 9.1  CREATININE 1.46* 1.30* 1.12 1.64*  GFRNONAA 50* 57* >60 43*    LIVER FUNCTION TESTS: Recent Labs    11/04/23 0846 11/19/23 0957 11/19/23 1347 11/21/23 0244 12/01/23 1351  BILITOT 1.8* 2.6* 2.5* 1.3* 1.7*  AST 40 39  --  70* 42*  ALT 41 29  --  105* 50*  ALKPHOS 223* 173*  --  222* 291*  PROT 7.2 7.3  --  6.3* 7.6  ALBUMIN 4.1 3.6  --  3.1* 3.8    TUMOR MARKERS: No results for input(s): AFPTM, CEA, CA199, CHROMGRNA in the last 8760 hours.  Assessment and Plan:  Diffuse Large B Cell Lymphoma: MORDCHE HEDGLIN is a 78 y.o. male with a history of basal cell carcinoma, colonic polyps, HTN, and recent diagnosis of diffuse large B cell lymphoma who presents to [facility] Interventional Radiology department for an image-guided port-a-catheter insertion with Dr. DELENA Balder. Procedure to be performed under moderate sedation.   Risks and benefits of image guided port-a-catheter placement was discussed with the patient including, but not limited to bleeding, infection, pneumothorax, or fibrin  sheath development and need for additional procedures.  All of the patient's questions were answered, patient is agreeable to proceed. Consent signed and in chart.   Thank you for this interesting consult. I greatly enjoyed meeting TYRICK DUNAGAN and look forward to participating in their care. A copy of this report was sent to the requesting provider on this  date.  Electronically Signed: Glennon CHRISTELLA Bal, PA-C 12/04/2023, 8:29 AM   I spent a total of 15 Minutes in face to face clinical consultation, greater than 50% of which was counseling/coordinating care for port-a-catheter insertion.

## 2023-12-03 ENCOUNTER — Other Ambulatory Visit

## 2023-12-03 ENCOUNTER — Other Ambulatory Visit: Payer: Self-pay | Admitting: Internal Medicine

## 2023-12-03 ENCOUNTER — Encounter: Payer: Self-pay | Admitting: Internal Medicine

## 2023-12-03 ENCOUNTER — Inpatient Hospital Stay

## 2023-12-03 DIAGNOSIS — C8333 Diffuse large B-cell lymphoma, intra-abdominal lymph nodes: Secondary | ICD-10-CM

## 2023-12-03 NOTE — Progress Notes (Signed)
 Patient for IR Port Placement on Fri 12/04/23, I called and spoke with the patient on the phone and gave pre-procedure instructions. Pt was made aware to be here at 7:30a, NPO after MN prior to procedure as well as driver post procedure/recovery/discharge. Pt stated understanding.  Called   12/03/23

## 2023-12-03 NOTE — Progress Notes (Signed)
 [facility] CSW Progress Note  Clinical Social Work introduced self to patient during Patient Education with Raoul Moats, Charity fundraiser.  Provided information regarding CSW role, including counseling, advanced care planning and support group.  Answered questions as needed.   Follow Up Plan:  CSW will follow-up with patient by phone     Macario CHRISTELLA Au, LCSW Clinical Social Worker [facility]

## 2023-12-03 NOTE — Progress Notes (Signed)
 Pharmacist Chemotherapy Monitoring - Initial Assessment    Anticipated start date: 12/10/23   The following has been reviewed per standard work regarding the patient's treatment regimen: The patient's diagnosis, treatment plan and drug doses, and organ/hematologic function Lab orders and baseline tests specific to treatment regimen  The treatment plan start date, drug sequencing, and pre-medications Prior authorization status  Patient's documented medication list, including drug-drug interaction screen and prescriptions for anti-emetics and supportive care specific to the treatment regimen The drug concentrations, fluid compatibility, administration routes, and timing of the medications to be used The patient's access for treatment and lifetime cumulative dose history, if applicable  The patient's medication allergies and previous infusion related reactions, if applicable   Changes made to treatment plan:  Cycle 1 made into 28 days to give rituxan early D1, following by regular Pola-R-CHP regimen starting D8 and continuing Q21 days Patient unable to get baseline EF until 12/15/23 Updated infusion times to reflect rituximab infusion times changes with cycle 1   Follow up needed:  Patient unable to get baseline EF until 12/15/23 Port placement planned 8/8 Monitor liver test, may need reduction in doxorubicin/Cytoxan   Maudie FORBES Andreas, PharmD, BCPS Clinical Pharmacist   12/03/2023  12:06 PM

## 2023-12-04 ENCOUNTER — Encounter: Payer: Self-pay | Admitting: Radiology

## 2023-12-04 ENCOUNTER — Ambulatory Visit
Admission: RE | Admit: 2023-12-04 | Discharge: 2023-12-04 | Disposition: A | Discharge status: Home / Self Care | Source: Ambulatory Visit | Attending: Internal Medicine | Admitting: Internal Medicine

## 2023-12-04 ENCOUNTER — Other Ambulatory Visit: Payer: Self-pay

## 2023-12-04 ENCOUNTER — Other Ambulatory Visit

## 2023-12-04 DIAGNOSIS — I10 Essential (primary) hypertension: Secondary | ICD-10-CM | POA: Insufficient documentation

## 2023-12-04 DIAGNOSIS — C7951 Secondary malignant neoplasm of bone: Secondary | ICD-10-CM | POA: Diagnosis present

## 2023-12-04 HISTORY — PX: IR IMAGING GUIDED PORT INSERTION: IMG5740

## 2023-12-04 MED ORDER — MIDAZOLAM HCL 2 MG/2ML IJ SOLN
INTRAMUSCULAR | Status: AC | PRN
  Administered 2023-12-04 (×2): .5 mg via INTRAVENOUS
  Administered 2023-12-04: 1 mg via INTRAVENOUS

## 2023-12-04 MED ORDER — LIDOCAINE-EPINEPHRINE 1 %-1:100000 IJ SOLN
INTRAMUSCULAR | Status: AC
  Filled 2023-12-04: qty 1

## 2023-12-04 MED ORDER — FENTANYL CITRATE (PF) 100 MCG/2ML IJ SOLN
INTRAMUSCULAR | Status: AC
  Filled 2023-12-04: qty 2

## 2023-12-04 MED ORDER — LIDOCAINE HCL 1 % IJ SOLN
INTRAMUSCULAR | Status: AC
  Filled 2023-12-04: qty 20

## 2023-12-04 MED ORDER — LIDOCAINE HCL 1 % IJ SOLN
20.0000 mL | Freq: Once | INTRAMUSCULAR | Status: AC
  Administered 2023-12-04: 17 mL via INTRADERMAL

## 2023-12-04 MED ORDER — SODIUM CHLORIDE 0.9 % IV SOLN: INTRAVENOUS | Status: DC

## 2023-12-04 MED ORDER — MIDAZOLAM HCL 2 MG/2ML IJ SOLN
INTRAMUSCULAR | Status: AC
  Filled 2023-12-04: qty 2

## 2023-12-04 MED ORDER — HEPARIN SOD (PORK) LOCK FLUSH 100 UNIT/ML IV SOLN
500.0000 [IU] | Freq: Once | INTRAVENOUS | Status: AC
  Administered 2023-12-04: 500 [IU] via INTRAVENOUS

## 2023-12-04 MED ORDER — FENTANYL CITRATE (PF) 100 MCG/2ML IJ SOLN
INTRAMUSCULAR | Status: AC | PRN
  Administered 2023-12-04: 25 ug via INTRAVENOUS
  Administered 2023-12-04: 50 ug via INTRAVENOUS
  Administered 2023-12-04: 25 ug via INTRAVENOUS

## 2023-12-04 MED ORDER — MIDAZOLAM HCL 2 MG/2ML IJ SOLN
INTRAMUSCULAR | Status: AC
Start: 2023-12-04 — End: 2023-12-04
  Filled 2023-12-04: qty 2

## 2023-12-04 MED ORDER — HEPARIN SOD (PORK) LOCK FLUSH 100 UNIT/ML IV SOLN
INTRAVENOUS | Status: AC
  Filled 2023-12-04: qty 5

## 2023-12-04 MED ORDER — LIDOCAINE-EPINEPHRINE 1 %-1:100000 IJ SOLN
20.0000 mL | Freq: Once | INTRAMUSCULAR | Status: AC
  Administered 2023-12-04: 10 mL via INTRADERMAL

## 2023-12-04 MED ORDER — LIDOCAINE-EPINEPHRINE 1 %-1:100000 IJ SOLN
INTRAMUSCULAR | Status: AC
Start: 2023-12-04 — End: 2023-12-04
  Filled 2023-12-04: qty 1

## 2023-12-04 NOTE — Procedures (Signed)
 Interventional Radiology Procedure:   Indications:   Procedure:   Findings:   Complications: None     EBL: Minimal  Plan:   Juliene SAUNDERS. Philip, MD  Pager: [phone]  Interventional Radiology Procedure:   Indications: Diffuse large B-cell lymphoma  Procedure: Port placement  Findings: Right jugular port, tip at SVC/RA junction.    Complications: None     EBL: Minimal, less than 10 ml  Plan: Discharge in one hour.  Keep port site and incisions dry for at least 24 hours.     Reshaun Briseno R. Philip, MD  Pager: [phone]

## 2023-12-05 ENCOUNTER — Other Ambulatory Visit: Payer: Self-pay | Admitting: Family Medicine

## 2023-12-05 DIAGNOSIS — I10 Essential (primary) hypertension: Secondary | ICD-10-CM

## 2023-12-07 ENCOUNTER — Ambulatory Visit

## 2023-12-07 ENCOUNTER — Ambulatory Visit
Admission: RE | Admit: 2023-12-07 | Discharge: 2023-12-07 | Disposition: A | Discharge status: Home / Self Care | Source: Ambulatory Visit | Attending: Radiation Oncology | Admitting: Radiation Oncology

## 2023-12-07 ENCOUNTER — Encounter: Payer: Self-pay | Admitting: *Deleted

## 2023-12-07 ENCOUNTER — Telehealth: Payer: Self-pay | Admitting: *Deleted

## 2023-12-07 DIAGNOSIS — I700 Atherosclerosis of aorta: Secondary | ICD-10-CM | POA: Diagnosis not present

## 2023-12-07 DIAGNOSIS — R590 Localized enlarged lymph nodes: Secondary | ICD-10-CM | POA: Insufficient documentation

## 2023-12-07 DIAGNOSIS — C7951 Secondary malignant neoplasm of bone: Secondary | ICD-10-CM | POA: Diagnosis present

## 2023-12-07 LAB — GLUCOSE, CAPILLARY: Glucose-Capillary: 117 mg/dL — ABNORMAL HIGH (ref 70–99)

## 2023-12-07 MED ORDER — LIDOCAINE-PRILOCAINE 2.5-2.5 % EX CREA: 1.0000 | TOPICAL_CREAM | CUTANEOUS | 1 refills | Status: DC | PRN

## 2023-12-07 MED ORDER — FLUDEOXYGLUCOSE F - 18 (FDG) INJECTION
10.1000 | Freq: Once | INTRAVENOUS | Status: AC | PRN
  Administered 2023-12-07 (×2): 10.83 via INTRAVENOUS

## 2023-12-07 NOTE — Telephone Encounter (Signed)
 Pt notified be message.

## 2023-12-07 NOTE — Telephone Encounter (Signed)
 I put an order for the Emla  cream and he wants to know should he get his flu shot before he starts his treatment and his treatment starts is 8/14

## 2023-12-08 ENCOUNTER — Telehealth: Payer: Self-pay | Admitting: *Deleted

## 2023-12-08 NOTE — Telephone Encounter (Signed)
 Patient called today because he says he had not seen his Emla  cream.  I called [facility] and they said it is ready to be picked up and it was sent in yesterday.  Patient is great now that he can come and get his Emla  cream

## 2023-12-09 ENCOUNTER — Other Ambulatory Visit: Payer: Self-pay

## 2023-12-09 ENCOUNTER — Ambulatory Visit: Admitting: Radiation Oncology

## 2023-12-09 ENCOUNTER — Ambulatory Visit

## 2023-12-09 DIAGNOSIS — C8333 Diffuse large B-cell lymphoma, intra-abdominal lymph nodes: Secondary | ICD-10-CM

## 2023-12-10 ENCOUNTER — Ambulatory Visit

## 2023-12-10 ENCOUNTER — Encounter: Payer: Self-pay | Admitting: Internal Medicine

## 2023-12-10 ENCOUNTER — Inpatient Hospital Stay

## 2023-12-10 ENCOUNTER — Encounter: Payer: Self-pay | Admitting: *Deleted

## 2023-12-10 ENCOUNTER — Inpatient Hospital Stay (HOSPITAL_BASED_OUTPATIENT_CLINIC_OR_DEPARTMENT_OTHER): Admitting: Internal Medicine

## 2023-12-10 VITALS — BP 123/94 | HR 97 | Temp 97.0°F | Resp 18 | Ht 72.0 in | Wt 190.9 lb

## 2023-12-10 VITALS — BP 106/72 | HR 81 | Temp 96.8°F | Resp 16

## 2023-12-10 DIAGNOSIS — Z5112 Encounter for antineoplastic immunotherapy: Secondary | ICD-10-CM | POA: Diagnosis not present

## 2023-12-10 DIAGNOSIS — C8333 Diffuse large B-cell lymphoma, intra-abdominal lymph nodes: Secondary | ICD-10-CM | POA: Diagnosis not present

## 2023-12-10 DIAGNOSIS — R17 Unspecified jaundice: Secondary | ICD-10-CM

## 2023-12-10 LAB — CBC WITH DIFFERENTIAL (CANCER CENTER ONLY)
Abs Immature Granulocytes: 1.41 K/uL — ABNORMAL HIGH (ref 0.00–0.07)
Basophils Absolute: 0.1 K/uL (ref 0.0–0.1)
Basophils Relative: 1 %
Eosinophils Absolute: 0 K/uL (ref 0.0–0.5)
Eosinophils Relative: 0 %
HCT: 35.1 % — ABNORMAL LOW (ref 39.0–52.0)
Hemoglobin: 12.3 g/dL — ABNORMAL LOW (ref 13.0–17.0)
Immature Granulocytes: 5 %
Lymphocytes Relative: 18 %
Lymphs Abs: 5.1 K/uL — ABNORMAL HIGH (ref 0.7–4.0)
MCH: 32.8 pg (ref 26.0–34.0)
MCHC: 35 g/dL (ref 30.0–36.0)
MCV: 93.6 fL (ref 80.0–100.0)
Monocytes Absolute: 2.1 K/uL — ABNORMAL HIGH (ref 0.1–1.0)
Monocytes Relative: 8 %
Neutro Abs: 19.1 K/uL — ABNORMAL HIGH (ref 1.7–7.7)
Neutrophils Relative %: 68 %
Platelet Count: 447 K/uL — ABNORMAL HIGH (ref 150–400)
RBC: 3.75 MIL/uL — ABNORMAL LOW (ref 4.22–5.81)
RDW: 14.9 % (ref 11.5–15.5)
WBC Count: 27.8 K/uL — ABNORMAL HIGH (ref 4.0–10.5)
nRBC: 0 % (ref 0.0–0.2)

## 2023-12-10 LAB — CMP (CANCER CENTER ONLY)
ALT: 99 U/L — ABNORMAL HIGH (ref 0–44)
AST: 43 U/L — ABNORMAL HIGH (ref 15–41)
Albumin: 3.4 g/dL — ABNORMAL LOW (ref 3.5–5.0)
Alkaline Phosphatase: 141 U/L — ABNORMAL HIGH (ref 38–126)
Anion gap: 8 (ref 5–15)
BUN: 26 mg/dL — ABNORMAL HIGH (ref 8–23)
CO2: 24 mmol/L (ref 22–32)
Calcium: 8.9 mg/dL (ref 8.9–10.3)
Chloride: 101 mmol/L (ref 98–111)
Creatinine: 1.01 mg/dL (ref 0.61–1.24)
GFR, Estimated: >60 mL/min (ref >60)
Glucose, Bld: 87 mg/dL (ref 70–99)
Potassium: 4.1 mmol/L (ref 3.5–5.1)
Sodium: 133 mmol/L — ABNORMAL LOW (ref 135–145)
Total Bilirubin: 1.4 mg/dL — ABNORMAL HIGH (ref 0.0–1.2)
Total Protein: 6.8 g/dL (ref 6.5–8.1)

## 2023-12-10 LAB — URIC ACID: Uric Acid, Serum: 5 mg/dL (ref 3.7–8.6)

## 2023-12-10 LAB — LACTATE DEHYDROGENASE: LDH: 343 U/L — ABNORMAL HIGH (ref 98–192)

## 2023-12-10 MED ORDER — SODIUM CHLORIDE 0.9% FLUSH
10.0000 mL | Freq: Once | INTRAVENOUS | Status: DC
  Filled 2023-12-10: qty 10

## 2023-12-10 MED ORDER — MONTELUKAST SODIUM 10 MG PO TABS
10.0000 mg | ORAL_TABLET | Freq: Once | ORAL | Status: AC
  Administered 2023-12-10: 10 mg via ORAL
  Filled 2023-12-10: qty 1

## 2023-12-10 MED ORDER — ACETAMINOPHEN 325 MG PO TABS
650.0000 mg | ORAL_TABLET | Freq: Once | ORAL | Status: AC
  Administered 2023-12-10: 650 mg via ORAL
  Filled 2023-12-10: qty 2

## 2023-12-10 MED ORDER — DIPHENHYDRAMINE HCL 25 MG PO CAPS
50.0000 mg | ORAL_CAPSULE | Freq: Once | ORAL | Status: AC
  Administered 2023-12-10: 50 mg via ORAL
  Filled 2023-12-10: qty 2

## 2023-12-10 MED ORDER — FAMOTIDINE IN NACL 20-0.9 MG/50ML-% IV SOLN
20.0000 mg | Freq: Once | INTRAVENOUS | Status: AC
  Administered 2023-12-10: 20 mg via INTRAVENOUS
  Filled 2023-12-10: qty 50

## 2023-12-10 MED ORDER — SODIUM CHLORIDE 0.9 % IV SOLN
INTRAVENOUS | Status: DC
  Filled 2023-12-10: qty 250

## 2023-12-10 MED ORDER — DEXAMETHASONE SODIUM PHOSPHATE 10 MG/ML IJ SOLN
10.0000 mg | Freq: Once | INTRAMUSCULAR | Status: AC
  Administered 2023-12-10: 10 mg via INTRAVENOUS
  Filled 2023-12-10: qty 1

## 2023-12-10 MED ORDER — SODIUM CHLORIDE 0.9 % IV SOLN
375.0000 mg/m2 | Freq: Once | INTRAVENOUS | Status: AC
  Administered 2023-12-10: 800 mg via INTRAVENOUS
  Filled 2023-12-10: qty 30

## 2023-12-10 NOTE — Progress Notes (Signed)
 Nutrition Assessment:  Patient identified on Malnutrition Screening report for weight loss.  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  Past medical history of HTN, HLD.  Patient receiving rituximab  today (POLA-R-[facility]P)  Met with patient and wife.  Patient reports over the last week appetite has improved.  Prior to this past week appetite has been poor for the last several months due to pain he was having.  Reports breakfast maybe a sausage biscuit.  Lunch maybe hot dog or barbecue (family bringing food).  Dinner maybe chicken pie, vegetables.  Has not tried oral nutrition supplements.  Takes metamucil to keep bowels moving.     Medications: reviewed  Labs: reviewed  Anthropometrics:   Height: 72 inches Weight: 190 lb 14.7 oz 219 lb 9.6 oz on 10/08/23 BMI: 25 14% weight loss in the last 2 months, significant  Estimated Energy Needs  Kcals: 2150-2580 Protein: 107-129 g Fluid: > 2 L  NUTRITION DIAGNOSIS: Inadequate oral intake related to cancer  and associated pain as evidenced by 14% weight loss in the last 2 months and poor po intake    INTERVENTION:  Discussed importance of nutrition and weight stability during treatment.  Recommend 350 calorie oral nutrition supplement. Samples of [employer] 1.4, boost [facility] and ensure complete given.  Handout provided on Nutrition During Cancer Treatment Encouraged calories and protein.  Contact information provided    MONITORING, EVALUATION, GOAL: weight trends, intake   NEXT VISIT: Thursday, Aug 21 during infusion  Perl Folmar B. Dasie SOLON, CSO, LDN Registered Dietitian [phone]

## 2023-12-10 NOTE — Progress Notes (Signed)
 [facility] CONSULT NOTE  Patient Care Team: Myrla Jon HERO, MD as PCP - General (Family Medicine) Verdene Gills, RN as Oncology Nurse Navigator Rennie Ruben SAUNDERS, MD as Consulting Physician (Oncology)  CHIEF COMPLAINTS/PURPOSE OF CONSULTATION: Lung mass/pelvic masses concerning for cancer  Oncology History Overview Note    #  STAGE IV- JULY 2025- CT chest- Right hilar mass/mediastinal adenopathy.  MRI lumbar spine shows-L2 lesion.  MRI of the pelvis- Substantial metastatic burden to the bilateral iliac bones, right ischium, bilateral posterior acetabular walls, left pubic bone, central and right sacrum, bilateral femoral neck, and bilateral intertrochanteric region. In particular, tumor in the proximal femurs  Extraosseous extension of tumor from the dominant left iliac lesion, tracking along the left iliopsoas and upper pelvic sidewall; posteriorly along the gluteus minimus and gluteus medius, and even posteriorly along the lower paraspinal and medial left gluteus maximus musculature; Extraosseous extension of tumor from the right iliac lesion, tracking along the right iliopsoas and right gluteus minimus. Tumor along the attachment site of the right rectus femoris muscle, which could predispose to avulsion. Scattered small pelvic lymph nodes; Mildly prominent median lobe of prostate gland indents the bladder base.  12/07/2023- PET scan-   Hypermetabolic right hilar and subcarinal lymphadenopathy consistent with lymphoma. Hypermetabolic disease involving the left iliac bone and adjacent iliopsoas and gluteus musculature; Hypermetabolic disease involving the bony anatomy of the pelvis and proximal femurs bilaterally, proximal left femoral diaphysis, left-sided ribs, lumbar spine, and sacral spine as well as parasacral soft tissues.  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   KI-67 IS ESSENTIALLY 100%. TO EXCLUDE A SO-CALLED HIGH-GRADE B-CELL  LYMPHOMA/DOUBLE HIT LYMPHOMA FISH IS PENDING.   MUGA SCAN- 8/19-  hepatitis panel_NEG.     # AUG 14th, 2025-  rituximab  today-pending MUGA scan.   # AUG 21st, 2025- cycle #1 of chemotherapy POLA R-CHP   DLBCL (diffuse large B cell lymphoma) (HCC)  12/01/2023 Initial Diagnosis   DLBCL (diffuse large B cell lymphoma) (HCC)   12/01/2023 Cancer Staging   Staging form: Hodgkin and Non-Hodgkin Lymphoma, AJCC 8th Edition - Clinical: Stage IV (Diffuse large B-cell lymphoma) - Signed by Dasja Brase R, MD on 12/01/2023   12/10/2023 -  Chemotherapy   Patient is on Treatment Plan : NON-HODGKINS LYMPHOMA POLA-R-CHP D1 X 6 cycles / Rituximab  D1 q21d X 2 cycles       HISTORY OF PRESENTING ILLNESS: Patient ambulating-in wheel chair. accompanied by wife.   Ruben Garcia 78 y.o.  male pleasant patient with a diffuse large B-cell lymphoma stage IV is here to proceed with chemotherapy  In the interim patient had a port placement.  Patient states he's doing well this morning. He also says he was a little restless last night due to today being his first treatment.  Patient states his pain is better controlled on prednisone .  His wheezing also improved.  He continues to have Percocet.   Review of Systems  Constitutional:  Positive for malaise/fatigue and weight loss. Negative for chills, diaphoresis and fever.  HENT:  Negative for nosebleeds and sore throat.   Eyes:  Negative for double vision.  Respiratory:  Positive for cough. Negative for hemoptysis, sputum production, shortness of breath and wheezing.   Cardiovascular:  Negative for chest pain, palpitations, orthopnea  and leg swelling.  Gastrointestinal:  Negative for abdominal pain, blood in stool, constipation, diarrhea, heartburn, melena, nausea and vomiting.  Genitourinary:   Negative for dysuria, frequency and urgency.  Musculoskeletal:  Positive for back pain, joint pain and myalgias.  Skin: Negative.  Negative for itching and rash.  Neurological:  Negative for dizziness, tingling, focal weakness, weakness and headaches.  Endo/Heme/Allergies:  Does not bruise/bleed easily.  Psychiatric/Behavioral:  Negative for depression. The patient is not nervous/anxious and does not have insomnia.     MEDICAL HISTORY:  Past Medical History:  Diagnosis Date   Allergic rhinitis, cause unspecified    Basal cell carcinoma of skin, site unspecified 04/28/2010   Nasal; [place].   BPH (benign prostatic hypertrophy)    Colon polyps    Essential hypertension, benign    Hemorrhoids, internal    Incomplete bladder emptying    Other abnormal glucose    Over weight    Personal history of colonic polyps    Personal history of other genital system and obstetric disorders(V13.29)    Pure hypercholesterolemia    Unspecified disorder of kidney and ureter    Unspecified disorder of liver     SURGICAL HISTORY: Past Surgical History:  Procedure Laterality Date   BASAL CELL CARCINOMA EXCISION  2012   Facial        [place]   COLONOSCOPY  11/13/2010   single polyp; repeatin 5 years; [place]   COLONOSCOPY N/A 11/04/2021   Procedure: COLONOSCOPY;  Surgeon: Maryruth Ole DASEN, MD;  Location: [facility] ENDOSCOPY;  Service: Endoscopy;  Laterality: N/A;  REQUEST EARLY TIME   COLONOSCOPY WITH PROPOFOL  N/A 12/13/2015   Procedure: COLONOSCOPY WITH PROPOFOL ;  Surgeon: Lamar DASEN Viktoria, MD;  Location: [facility] ENDOSCOPY;  Service: Endoscopy;  Laterality: N/A;   ear surgery  05/2012   [name].  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  Benign.   HEMORRHOID SURGERY  1978   [name]   IR IMAGING GUIDED PORT INSERTION  12/04/2023   LAPAROSCOPIC CHOLECYSTECTOMY  1982   rotator cuff surgery Right 05/14/2016   VASECTOMY  1978    SOCIAL HISTORY: Social History   Socioeconomic History   Marital status: Married    Spouse  name: Heron   Number of children: 2   Years of education: 2 years college   Highest education level: Not on file  Occupational History   Occupation: retired    Comment: postal service in 2005  Tobacco Use   Smoking status: Never   Smokeless tobacco: Never  Vaping Use   Vaping status: Never Used  Substance and Sexual Activity   Alcohol use: Not Currently    Alcohol/week: 5.0 standard drinks of alcohol    Types: 5 Cans of beer per week    Comment: weekends beer, 4- 6 total Cleotilde Mallory   Drug use: No   Sexual activity: Not Currently    Partners: Female  Other Topics Concern   Not on file  Social History Narrative   Always uses seat belts. Smoke alarm and carbon monoxide detector in the home. Guns in the home stored in locked cabinet.Caffeine use: Coffee 2 servings per day, moderate amount. Exercise: Moderate walking 2 - 4 miles daily, 2 - 3 days per week.    Marital status:  Married x 47 years, happily.      Children:  2 children; 1 grandchild (20).      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      Tobacco: never  Alcohol:  Weekends; beer 4-6 per weekend.      Exercise:  Walking every other day 2.5 miles three times per day.      Seatbelt: 100%; no texting      Guns: secured guns.      Living Will: completed in 2014.  FULL CODE but no prolonged measures.        ADLs: independent with ADLs; no assistant devices   Social Drivers of Health   Financial Resource Strain: Not on file  Food Insecurity: No Food Insecurity (11/27/2023)   Hunger Vital Sign    Worried About Running Out of Food in the Last Year: Never true    Ran Out of Food in the Last Year: Never true  Transportation Needs: No Transportation Needs (11/27/2023)   PRAPARE - Administrator, Civil Service (Medical): No    Lack of Transportation (Non-Medical): No  Physical Activity: Not on file  Stress: Not on file  Social Connections: Unknown (11/19/2023)   Social Connection and Isolation  Panel    Frequency of Communication with Friends and Family: Three times a week    Frequency of Social Gatherings with Friends and Family: Three times a week    Attends Religious Services: Patient declined    Active Member of Clubs or Organizations: Patient declined    Attends Banker Meetings: Patient declined    Marital Status: Married  Catering manager Violence: Not At Risk (11/27/2023)   Humiliation, Afraid, Rape, and Kick questionnaire    Fear of Current or Ex-Partner: No    Emotionally Abused: No    Physically Abused: No    Sexually Abused: No    FAMILY HISTORY: Family History  Problem Relation Age of Onset   Heart disease Mother        first MI in 7s   Diabetes Mother    Hyperlipidemia Mother    Arthritis Mother        rheumatoid   Cancer Father        lung   Diabetes Father    Arthritis Sister    Hyperlipidemia Sister    Hyperlipidemia Sister    Hypertension Sister    Hyperlipidemia Sister    Kidney disease Neg Hx    Prostate cancer Neg Hx    Colon cancer Neg Hx     ALLERGIES:  is allergic to codeine.  MEDICATIONS:  Current Outpatient Medications  Medication Sig Dispense Refill   acetaminophen  (TYLENOL ) 325 MG tablet Take 2 tablets (650 mg total) by mouth every 6 (six) hours as needed for mild pain (pain score 1-3), fever, headache or moderate pain (pain score 4-6) (or Fever >/= 101).     albuterol  (VENTOLIN  HFA) 108 (90 Base) MCG/ACT inhaler Inhale 1-2 puffs into the lungs every 6 (six) hours as needed. 18 g 0   cyclobenzaprine  (FLEXERIL ) 10 MG tablet Take 1 tablet (10 mg total) by mouth 3 (three) times daily as needed for muscle spasms. 30 tablet 2   lidocaine -prilocaine  (EMLA ) cream Apply 1 Application topically as needed. Apply to port and cover with saran wrap 1-2 hours prior to port access 30 g 1   predniSONE  (DELTASONE ) 20 MG tablet Take 3 pills a day for 1 week; 2 pills a day 1 week; 1 pill a day for 1 week and stop. 45 tablet 0    Cyanocobalamin  (VITAMIN B 12) 500 MCG TABS Take 100 mg by mouth daily at 6 (six) AM. (Patient not taking: Reported on 12/10/2023)  fentaNYL  (DURAGESIC ) 25 MCG/HR Place 1 patch onto the skin every 3 (three) days. (Patient not taking: Reported on 12/10/2023) 10 patch 0   fluticasone  (FLONASE ) 50 MCG/ACT nasal spray Place 2 sprays into the nose. (Patient not taking: Reported on 12/10/2023)     gabapentin  (NEURONTIN ) 300 MG capsule Take 1 capsule (300 mg total) by mouth at bedtime for 7 days, THEN 1 capsule (300 mg total) 2 (two) times daily. (Patient not taking: No sig reported) 73 capsule 0   lisinopril  (ZESTRIL ) 40 MG tablet Take 1 tablet by mouth once daily (Patient not taking: Reported on 12/10/2023) 90 tablet 0   Multiple Vitamins-Minerals (MULTIVITAMIN PO) Take by mouth daily. (Patient not taking: Reported on 12/10/2023)     ondansetron  (ZOFRAN ) 4 MG tablet Take 1 tablet (4 mg total) by mouth every 8 (eight) hours as needed for nausea or vomiting. (Patient not taking: Reported on 12/10/2023) 30 tablet 0   oxyCODONE -acetaminophen  (PERCOCET) 5-325 MG tablet Take 1 tablet by mouth every 6 (six) hours as needed for severe pain (pain score 7-10). Must last 30 days. (Patient not taking: Reported on 12/10/2023) 90 tablet 0   polyethylene glycol (MIRALAX ) 17 g packet Take 17 g by mouth daily as needed. (Patient not taking: Reported on 12/10/2023) 30 each 0   rosuvastatin  (CRESTOR ) 5 MG tablet Take 1 tablet (5 mg total) by mouth daily. (Patient not taking: Reported on 12/10/2023) 90 tablet 1   vitamin E  400 UNIT capsule Take 400 Units by mouth daily. (Patient not taking: Reported on 12/10/2023)     No current facility-administered medications for this visit.   Facility-Administered Medications Ordered in Other Visits  Medication Dose Route Frequency Provider Last Rate Last Admin   0.9 %  sodium chloride  infusion   Intravenous Continuous Talyn Eddie R, MD 10 mL/hr at 12/10/23 0930 New Bag at 12/10/23 0930     PHYSICAL EXAMINATION:   Vitals:   12/10/23 0822  BP: (!) 123/94  Pulse: 97  Resp: 18  Temp: (!) 97 F (36.1 C)  SpO2: 99%   Filed Weights   12/10/23 0822  Weight: 190 lb 14.7 oz (86.6 kg)   Patient is wheezing bilaterally.  Physical Exam Vitals and nursing note reviewed.  HENT:     Head: Normocephalic and atraumatic.     Mouth/Throat:     Pharynx: Oropharynx is clear.  Eyes:     Extraocular Movements: Extraocular movements intact.     Pupils: Pupils are equal, round, and reactive to light.  Cardiovascular:     Rate and Rhythm: Normal rate and regular rhythm.  Pulmonary:     Comments: Decreased breath sounds bilaterally.  Abdominal:     Palpations: Abdomen is soft.  Musculoskeletal:        General: Normal range of motion.     Cervical back: Normal range of motion.  Skin:    General: Skin is warm.  Neurological:     General: No focal deficit present.     Mental Status: He is alert and oriented to person, place, and time.  Psychiatric:        Behavior: Behavior normal.        Judgment: Judgment normal.     LABORATORY DATA:  I have reviewed the data as listed Lab Results  Component Value Date   WBC 27.8 (H) 12/10/2023   HGB 12.3 (L) 12/10/2023   HCT 35.1 (L) 12/10/2023   MCV 93.6 12/10/2023   PLT 447 (H) 12/10/2023   Recent Labs  03/31/23 0930 10/08/23 0900 11/19/23 1347 11/20/23 0511 11/21/23 0244 12/01/23 1351 12/10/23 0816  NA 143   < >  --    < > 134* 131* 133*  K 4.9   < >  --    < > 4.0 4.3 4.1  CL 103   < >  --    < > 100 100 101  CO2 26   < >  --    < > 25 20* 24  GLUCOSE 103*   < >  --    < > 100* 141* 87  BUN 11   < >  --    < > 20 26* 26*  CREATININE 1.21   < >  --    < > 1.12 1.64* 1.01  CALCIUM  10.0   < >  --    < > 10.4* 9.1 8.9  GFRNONAA  --    < >  --    < > >60 43* >60  PROT 7.4   < >  --   --  6.3* 7.6 6.8  ALBUMIN 4.7   < >  --   --  3.1* 3.8 3.4*  AST 34   < >  --   --  70* 42* 43*  ALT 38   < >  --   --  105* 50*  99*  ALKPHOS 52   < >  --   --  222* 291* 141*  BILITOT 1.7*  1.9*   < > 2.5*  --  1.3* 1.7* 1.4*  BILIDIR 0.40  --  0.3*  --   --   --   --   IBILI 1.30*  --  2.2*  --   --   --   --    < > = values in this interval not displayed.    RADIOGRAPHIC STUDIES: I have personally reviewed the radiological images as listed and agreed with the findings in the report. NM PET Image Initial (PI) Whole Body Result Date: 12/07/2023 CLINICAL DATA:  Initial treatment strategy for diffuse large B-cell lymphoma. EXAM: NUCLEAR MEDICINE PET WHOLE BODY TECHNIQUE: 10.8 mCi F-18 FDG was injected intravenously. Full-ring PET imaging was performed from the head to foot after the radiotracer. CT data was obtained and used for attenuation correction and anatomic localization. Fasting blood glucose: 117 mg/dl COMPARISON:  Chest CT 92/75/7974. MRI lumbar spine 11/19/2023. MR pelvis 11/21/2023. FINDINGS: Mediastinal blood pool activity: SUV max 2.8 Liver activity: SUV max = 3.5 HEAD/NECK: No hypermetabolic activity in the scalp. No hypermetabolic cervical lymph nodes. Incidental CT findings: none CHEST: Previously characterized right none hilar nodal mass is hypermetabolic with SUV max = 17.7. Subcarinal hypermetabolism demonstrates SUV max = 12.4. No left hilar or axillary hypermetabolism. Incidental CT findings: There is mild atherosclerotic calcification of the abdominal aorta without aneurysm. Right Port-A-Cath tip is positioned at the junction of the SVC and RA. Coronary artery calcification is evident. No suspicious pulmonary nodule or mass. No focal consolidation or effusion. ABDOMEN/PELVIS: No abnormal hypermetabolic activity within the liver, pancreas, adrenal glands, or spleen. No hypermetabolic lymph nodes in the abdomen or pelvis. Disease involving the left iliac bone in adjacent iliopsoas and gluteus musculature demonstrates SUV max = 33.2. Right-sided presacral disease demonstrates SUV max = 27.6. Hypermetabolic  lesions are seen in the humeral heads bilaterally posterior left fourth rib, posterior left twelfth rib, lumbar spine and bony/muscular anatomy of the pelvis better characterized on recent MRI. Hypermetabolic lesions are seen in the femoral  necks bilaterally and in the pubic rami. Incidental CT findings: Changes in the bony anatomy and pelvic muscular anatomy are relatively subtle on noncontrast CT imaging compared to the degree of hypermetabolism. There is some asymmetry of pelvic musculature compatible disease involvement. SKELETON: See above. Incidental CT findings: See above. EXTREMITIES: Hypermetabolic marrow lesion identified in the left femoral diaphysis. No suspicious hypermetabolism below the knees. Incidental CT findings: none IMPRESSION: 1. Hypermetabolic right hilar and subcarinal lymphadenopathy consistent with lymphoma. 2. Hypermetabolic disease involving the left iliac bone and adjacent iliopsoas and gluteus musculature. 3. Hypermetabolic disease involving the bony anatomy of the pelvis and proximal femurs bilaterally, proximal left femoral diaphysis, left-sided ribs, lumbar spine, and sacral spine as well as parasacral soft tissues. 4.  Aortic Atherosclerosis (ICD10-I70.0). Electronically Signed   By: Camellia Candle M.D.   On: 12/07/2023 16:14   IR IMAGING GUIDED PORT INSERTION Result Date: 12/04/2023 INDICATION: Diffuse large B-cell lymphoma. EXAM: FLUOROSCOPIC AND ULTRASOUND GUIDED PLACEMENT OF A SUBCUTANEOUS PORT MEDICATIONS: Moderate sedation ANESTHESIA/SEDATION: Moderate (conscious) sedation was employed during this procedure. A total of Versed  2 mg and fentanyl  100 mcg was administered intravenously at the order of the provider performing the procedure. Total intra-service moderate sedation time: 32 minutes. Patient's level of consciousness and vital signs were monitored continuously by radiology nurse throughout the procedure under the supervision of the provider performing the procedure.  FLUOROSCOPY TIME:  Radiation Exposure Index (as provided by the fluoroscopic device): 1 mGy Kerma COMPLICATIONS: None immediate. PROCEDURE: The procedure, risks, benefits, and alternatives were explained to the patient. Questions regarding the procedure were encouraged and answered. The patient understands and consents to the procedure. Patient was placed supine on the interventional table. Ultrasound confirmed a patent right internal jugular vein. Ultrasound image was saved for documentation. The right chest and neck were cleaned with a skin antiseptic and a sterile drape was placed. Maximal barrier sterile technique was utilized including caps, mask, sterile gowns, sterile gloves, sterile drape, hand hygiene and skin antiseptic. The right neck was anesthetized with 1% lidocaine . Small incision was made in the right neck with a blade. Micropuncture set was placed in the right internal jugular vein with ultrasound guidance. The micropuncture wire was used for measurement purposes. The right chest was anesthetized with 1% lidocaine  with epinephrine . #15 blade was used to make an incision and a subcutaneous port pocket was formed. 8 french Power Port was assembled. Subcutaneous tunnel was formed with a stiff tunneling device. The port catheter was brought through the subcutaneous tunnel. The port was placed in the subcutaneous pocket. The micropuncture set was exchanged for a peel-away sheath. The catheter was placed through the peel-away sheath and the tip was positioned at the superior cavoatrial junction. Catheter placement was confirmed with fluoroscopy. The port was accessed and flushed with heparinized saline. The port pocket was closed using two layers of absorbable sutures and Dermabond. The vein skin site was closed using a single layer of absorbable suture and Dermabond. Sterile dressings were applied. Patient tolerated the procedure well without an immediate complication. Ultrasound and fluoroscopic images  were taken and saved for this procedure. IMPRESSION: Placement of a subcutaneous power-injectable port device. Catheter tip at the superior cavoatrial junction. Electronically Signed   By: Juliene Balder M.D.   On: 12/04/2023 09:46   CT BIOPSY Result Date: 11/24/2023 CLINICAL DATA:  Right hilar lung mass with lymphadenopathy and multiple bone lesions including the left iliac bone and soft tissue masses involving the left iliopsoas and gluteal musculature by  MRI. EXAM: CT GUIDED CORE BIOPSY OF LEFT GLUTEAL SOFT TISSUE MASS ANESTHESIA/SEDATION: Moderate (conscious) sedation was employed during this procedure. A total of Versed  2.0 mg and Fentanyl  150 mcg was administered intravenously. Moderate Sedation Time: 17 minutes. The patient's level of consciousness and vital signs were monitored continuously by radiology nursing throughout the procedure under my direct supervision. PROCEDURE: The procedure risks, benefits, and alternatives were explained to the patient. Questions regarding the procedure were encouraged and answered. The patient understands and consents to the procedure. A time out was performed prior to initiating the procedure. RADIATION DOSE REDUCTION: This exam was performed according to the departmental dose-optimization program which includes automated exposure control, adjustment of the mA and/or kV according to patient size and/or use of iterative reconstruction technique. The left pelvic region was prepped with chlorhexidine  in a sterile fashion, and a sterile drape was applied covering the operative field. A sterile gown and sterile gloves were used for the procedure. Local anesthesia was provided with 1% Lidocaine . CT of the pelvis was performed in a supine position. Under CT guidance, a 17 gauge trocar needle was advanced to the level of a lateral left soft tissue mass deep to the gluteal musculature and abutting the left iliac bone. After confirming needle tip position, 3 separate coaxial 18 gauge  core biopsy samples were obtained and submitted in formalin. Some Gel-Foam pledgets were advanced through the outer needle prior to its removal. COMPLICATIONS: None FINDINGS: Smooth, rounded configuration mass extending lateral to the left iliac bone and ileum measures up to approximately 4.1 x 7.2 cm in maximum transverse dimensions by unenhanced CT and lies deep to the gluteus minimus muscle. Biopsy was performed yielding solid tissue. IMPRESSION: CT-guided biopsy of left gluteal region soft tissue mass deep to the gluteus minimus muscle measuring just over approximately 7 cm in diameter by CT. Electronically Signed   By: Marcey Moan M.D.   On: 11/24/2023 13:39   MR PELVIS W WO CONTRAST Result Date: 11/21/2023 CLINICAL DATA:  Right hilar mass with osseous metastatic disease, including pelvic tumor EXAM: MRI PELVIS WITHOUT AND WITH CONTRAST TECHNIQUE: Multiplanar multisequence MR imaging of the pelvis was performed both before and after administration of intravenous contrast. CONTRAST:  9mL GADAVIST  GADOBUTROL  1 MMOL/ML IV SOLN COMPARISON:  Lumbar MRI 11/19/2023 FINDINGS: OSSEOUS STRUCTURES AND JOINTS: Substantial metastatic burden to the bilateral iliac bones, right ischium, bilateral posterior acetabular walls, left pubic bone,, central and right sacrum, bilateral femoral neck, and bilateral intertrochanteric region. In particular, tumor in the proximal femurs predisposes the patient to pathologic fracture and special cautions with weight-bearing activities is recommended. Regarding the dominant left iliac lesion, there is extensive extraosseous extension of tumor both anteriorly along the iliopsoas and upper pelvic sidewall; posteriorly along the gluteus minimus and gluteus medius, and even posteriorly along the lower paraspinal and medial left gluteus maximus musculature. A small amount of tumor is present along the left sciatic notch but does not visibly impinge on the left sciatic nerve. Likewise there  is extraosseous extension of tumor anteriorly associated with the right sacral lesion, partially tracking along the upper margin of the right piriformis muscle. Mild extraosseous extension of tumor anteriorly and posteriorly from the right iliac lesion is shown on image 20 of series 14, tracking along the right iliopsoas and right gluteus minimus. There is tumor along the attachment site of the right rectus femoris muscle, which could predispose to avulsion. MUSCULOTENDINOUS: Muscular involvement by extraosseous extension of tumor as noted above. No separate purely  muscular metastatic lesion identified. There is muscular edema involving portions of the bilateral iliopsoas musculature as well as left greater than right gluteal musculature and hip adductor musculature, likely related to the above described tumors. SACRAL PLEXUS: Tumor from the left iliac bone is tangential to components of the left sacral plexus example on image 19 of series 8 but does not demonstrably involve the sacral plexus at this time. OTHER: Scattered small pelvic lymph nodes, one of the largest is a 0.9 cm left iliac node on image 17 series 14. Mild presacral edema likely related to the sacral metastatic lesion. Mildly prominent median lobe of prostate gland indents the bladder base. Mildly fatty left spermatic cord. IMPRESSION: 1. Substantial metastatic burden to the bilateral iliac bones, right ischium, bilateral posterior acetabular walls, left pubic bone, central and right sacrum, bilateral femoral neck, and bilateral intertrochanteric region. In particular, tumor in the proximal femurs predisposes the patient to pathologic fracture and special cautions with weight-bearing activities is recommended. 2. Extraosseous extension of tumor from the dominant left iliac lesion, tracking along the left iliopsoas and upper pelvic sidewall; posteriorly along the gluteus minimus and gluteus medius, and even posteriorly along the lower paraspinal and  medial left gluteus maximus musculature. 3. Extraosseous extension of tumor from the right iliac lesion, tracking along the right iliopsoas and right gluteus minimus. 4. Tumor along the attachment site of the right rectus femoris muscle, which could predispose to avulsion. 5. Scattered small pelvic lymph nodes, one of the largest is a 0.9 cm left iliac node. 6. Mildly prominent median lobe of prostate gland indents the bladder base. 7. Mildly fatty left spermatic cord. Electronically Signed   By: Ryan Salvage M.D.   On: 11/21/2023 12:09   MR Lumbar Spine W Wo Contrast Result Date: 11/19/2023 CLINICAL DATA:  Low back pain, cancer suspected EXAM: MRI LUMBAR SPINE WITHOUT AND WITH CONTRAST TECHNIQUE: Multiplanar and multiecho pulse sequences of the lumbar spine were obtained without and with intravenous contrast. CONTRAST:  9mL GADAVIST  GADOBUTROL  1 MMOL/ML IV SOLN COMPARISON:  None Available. FINDINGS: Segmentation:  Standard. Alignment:  Physiologic. Vertebrae: T1 hypointense, stir hyperintense, and enhancing lesions at L2 and involving the partially imaged sacral vertebral bodies and sacrum, compatible with metastases. Conus medullaris and cauda equina: Conus extends to the L1 level. Conus and cauda equina appear normal. Paraspinal and other soft tissues: Partially imaged suspected extraosseous extension of tumor involving the pelvis on the left and possibly the right. Disc levels: T12-L1: No significant disc protrusion, foraminal stenosis, or canal stenosis. L1-L2: No significant disc protrusion, foraminal stenosis, or canal stenosis. L2-L3: No significant disc protrusion, foraminal stenosis, or canal stenosis. L3-L4: Mild facet arthropathy.  No significant stenosis. L4-L5: Moderate facet arthropathy and mild disc bulging. Mild left foraminal stenosis. Patent canal. L5-S1: Moderate bilateral facet arthropathy and mild disc bulging. Mild right foraminal stenosis. Pain canal. IMPRESSION: 1. 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 2. Partially imaged suspected extraosseous extension of tumor involving the pelvis on the left and potentially the right. Recommend MRI of the pelvis with contrast for further evaluation. Electronically Signed   By: Gilmore GORMAN Molt M.D.   On: 11/19/2023 23:53   CT CHEST WO CONTRAST Result Date: 11/19/2023 CLINICAL DATA:  Generalized weakness. Dyspnea. Concern for lung cancer. * Tracking Code: BO * EXAM: CT CHEST WITHOUT CONTRAST TECHNIQUE: Multidetector CT imaging of the chest was performed following the standard protocol without IV contrast. RADIATION DOSE REDUCTION: This exam  was performed according to the departmental dose-optimization program which includes automated exposure control, adjustment of the mA and/or kV according to patient size and/or use of iterative reconstruction technique. COMPARISON:  Plain film of earlier today.  No prior CT FINDINGS: Cardiovascular: Aortic atherosclerosis. Tortuous thoracic aorta. Normal heart size, without pericardial effusion. Left main and 3 vessel coronary artery calcification. Mediastinum/Nodes: No supraclavicular adenopathy. Subcarinal nodal mass measures 1.6 x 3.2 cm on 69/3. Right hilar nodal mass is poorly delineated secondary to noncontrast technique. Estimated at 3.2 x 2.8 cm on 68/3, causing endobronchial compression as detailed below. Lungs/Pleura: No pleural fluid. Endobronchial marked narrowing, near obstruction involving the bronchus intermedius including on 70/4. More mild narrowing of the proximal right middle and right lower lobe bronchi. Minimal exclusion of the inferior most right lung base. 1.1 x 1.3 cm soft tissue density within the anterior most right lower lobe is favored to be pulmonary parenchymal nodule over an infrahilar node. Example on image 68/4 Scattered calcified granulomas. Upper Abdomen: Nonspecific caudate lobe enlargement. Cholecystectomy.  Normal imaged portions of the spleen, stomach, pancreas, adrenal glands, left kidney. Musculoskeletal: Mid and lower thoracic spondylosis. IMPRESSION: 1. 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 The dominant right hilar nodal mass causes narrowing of the bronchus intermedius. Recommend multidisciplinary thoracic oncology consultation for eventual PET and tissue sampling. Alternatively, sampling via bronchoscopy could be performed. 2. 1.3 cm soft tissue density along the anterior most right lower lobe is favored to represent a parenchymal pulmonary nodule and is most likely the site of primary. An enlarged infrahilar node could look similar. 3. Incidental findings, including: Coronary artery atherosclerosis. Aortic Atherosclerosis (ICD10-I70.0). Electronically Signed   By: Rockey Kilts M.D.   On: 11/19/2023 12:44   DG Chest 2 View Result Date: 11/19/2023 CLINICAL DATA:  weakness EXAM: CHEST - 2 VIEW COMPARISON:  October 16, 2023 FINDINGS: No focal airspace consolidation, pleural effusion, or pneumothorax. No cardiomegaly. Tortuous aorta with aortic atherosclerosis. No acute fracture or destructive lesions. Multilevel thoracic osteophytosis. IMPRESSION: No acute cardiopulmonary abnormality. Electronically Signed   By: Rogelia Myers M.D.   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  MRI lumbar spine shows-L2 lesion.  MRI of the pelvis- Substantial metastatic burden to the bilateral iliac bones, right ischium, bilateral posterior acetabular walls, left pubic bone, central and right sacrum, bilateral femoral neck, and bilateral intertrochanteric region. In particular, tumor in the proximal femurs  Extraosseous extension of tumor from the dominant left iliac lesion, tracking along the left iliopsoas and upper pelvic  sidewall; posteriorly along the gluteus minimus and gluteus medius, and even posteriorly along the lower paraspinal and medial left gluteus maximus musculature; Extraosseous extension of tumor from the right iliac lesion, tracking along the right iliopsoas and right gluteus minimus. Tumor along the attachment site of the right rectus femoris muscle, which could predispose to avulsion. Scattered small pelvic lymph nodes; Mildly prominent median lobe of prostate gland indents the bladder base.  12/07/2023- PET scan-   Hypermetabolic right hilar and subcarinal lymphadenopathy consistent with lymphoma. Hypermetabolic disease involving the left iliac bone and adjacent iliopsoas and gluteus musculature; Hypermetabolic disease involving the bony anatomy of the pelvis and proximal femurs bilaterally, proximal left femoral diaphysis, left-sided ribs, lumbar spine, and sacral spine as well as parasacral soft tissues.  # JULY 2025- Soft tissue, biopsy, left  gluteal soft tissue mass 7.5 cm: MORPHOLOGICALLY CONSISTENT WITH DIFFUSE LARGE B-CELL LYMPHOMA - THE CELLS OF   INTEREST ARE B CELLS (CD20/CD45 POSITIVE WHICH COEXPRESS MUM1 AND BCL6, BUT ARE NEGATIVE FOR CD10.   KI-67 IS ESSENTIALLY 100%. TO EXCLUDE A SO-CALLED HIGH-GRADE B-CELL  LYMPHOMA/DOUBLE HIT LYMPHOMA FISH IS PENDING.   MUGA SCAN- 8/19-  hepatitis panel_NEG.     # Proceed with rituximab  today-pending MUGA scan.   # In 1 week plan proceed with cycle #1 of chemotherapy POLA R-CHP chemotherapy every 3 weeks x 6 cycles.  Discussed therapies cure. I would do interim scan after 2-3 cycles of chemo.    # ID prophylaxis-recommend acyclovir  400 twice daily; Bactrim  Monday Wednesday Friday.  Tumor lysis prophylaxis-allopurinol  300 mg a day   # Lower back pain-secondary to presumed malignancy involving the L2/sacrum-started patient on fentanyl  patch 12.5 mcg; continue Percocet 1 pill every 6 hours as needed-- improved-again discussed importance of walking with  support given involvement of the femur.  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    # Elevated AST ALT-question etiology.  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  Elevated alkaline phosphatase likely due to bone mets- check UGTA-1     # IV access: s/p Mediport placement.   # DISPOSITION: # chemo today- and follow up as planned- per IS- Dr.B     Above plan of care was discussed with patient/family in detail.  My contact information was given to the patient/family.     Ruben JONELLE Joe, MD 12/10/2023 12:28 PM

## 2023-12-10 NOTE — Patient Instructions (Signed)
 [facility] - A DEPT OF [facility]  Discharge Instructions: Thank you for choosing [facility] to provide your oncology and hematology care.  If you have a lab appointment with the Cancer Center, please go directly to the Cancer Center and check in at the registration area.  Wear comfortable clothing and clothing appropriate for easy access to any Portacath or PICC line.   We strive to give you quality time with your provider. You may need to reschedule your appointment if you arrive late (15 or more minutes).  Arriving late affects you and other patients whose appointments are after yours.  Also, if you miss three or more appointments without notifying the office, you may be dismissed from the clinic at the provider's discretion.      For prescription refill requests, have your pharmacy contact our office and allow 72 hours for refills to be completed.    Today you received the following chemotherapy and/or immunotherapy agents Ruxience       To help prevent nausea and vomiting after your treatment, we encourage you to take your nausea medication as directed.  BELOW ARE SYMPTOMS THAT SHOULD BE REPORTED IMMEDIATELY: *FEVER GREATER THAN 100.4 F (38 C) OR HIGHER *CHILLS OR SWEATING *NAUSEA AND VOMITING THAT IS NOT CONTROLLED WITH YOUR NAUSEA MEDICATION *UNUSUAL SHORTNESS OF BREATH *UNUSUAL BRUISING OR BLEEDING *URINARY PROBLEMS (pain or burning when urinating, or frequent urination) *BOWEL PROBLEMS (unusual diarrhea, constipation, pain near the anus) TENDERNESS IN MOUTH AND THROAT WITH OR WITHOUT PRESENCE OF ULCERS (sore throat, sores in mouth, or a toothache) UNUSUAL RASH, SWELLING OR PAIN  UNUSUAL VAGINAL DISCHARGE OR ITCHING   Items with * indicate a potential emergency and should be followed up as soon as possible or go to the Emergency Department if any problems should occur.  Please show the CHEMOTHERAPY ALERT CARD or IMMUNOTHERAPY  ALERT CARD at check-in to the Emergency Department and triage nurse.  Should you have questions after your visit or need to cancel or reschedule your appointment, please contact [facility] - A DEPT OF [facility]  [phone] and follow the prompts.  Office hours are 8:00 a.m. to 4:30 p.m. Monday - Friday. Please note that voicemails left after 4:00 p.m. may not be returned until the following business day.  We are closed weekends and major holidays. You have access to a nurse at all times for urgent questions. Please call the main number to the clinic [phone] and follow the prompts.  For any non-urgent questions, you may also contact your provider using MyChart. We now offer e-Visits for anyone 19 and older to request care online for non-urgent symptoms. For details visit [email or web address].   Also download the MyChart app! Go to the app store, search "MyChart", open the app, select , and log in with your MyChart username and password.

## 2023-12-10 NOTE — Assessment & Plan Note (Addendum)
#    STAGE IV- JULY 2025- CT chest- Right hilar mass/mediastinal adenopathy.  MRI lumbar spine shows-L2 lesion.  MRI of the pelvis- Substantial metastatic burden to the bilateral iliac bones, right ischium, bilateral posterior acetabular walls, left pubic bone, central and right sacrum, bilateral femoral neck, and bilateral intertrochanteric region. In particular, tumor in the proximal femurs  Extraosseous extension of tumor from the dominant left iliac lesion, tracking along the left iliopsoas and upper pelvic sidewall; posteriorly along the gluteus minimus and gluteus medius, and even posteriorly along the lower paraspinal and medial left gluteus maximus musculature; Extraosseous extension of tumor from the right iliac lesion, tracking along the right iliopsoas and right gluteus minimus. Tumor along the attachment site of the right rectus femoris muscle, which could predispose to avulsion. Scattered small pelvic lymph nodes; Mildly prominent median lobe of prostate gland indents the bladder base.  12/07/2023- PET scan-   Hypermetabolic right hilar and subcarinal lymphadenopathy consistent with lymphoma. Hypermetabolic disease involving the left iliac bone and adjacent iliopsoas and gluteus musculature; Hypermetabolic disease involving the bony anatomy of the pelvis and proximal femurs bilaterally, proximal left femoral diaphysis, left-sided ribs, lumbar spine, and sacral spine as well as parasacral soft tissues.  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   KI-67 IS ESSENTIALLY 100%. TO EXCLUDE A SO-CALLED HIGH-GRADE B-CELL  LYMPHOMA/DOUBLE HIT LYMPHOMA FISH IS PENDING.   MUGA SCAN- 8/19-  hepatitis panel_NEG.     # Proceed with rituximab  today-pending MUGA scan.  Leukocytosis likely from recent  prednisone .  # In 1 week plan proceed with cycle #1 of chemotherapy POLA R-CHP chemotherapy every 3 weeks x 6 cycles.  Discussed therapies cure. I would do interim scan after 2-3 cycles of chemo.    # ID prophylaxis-recommend acyclovir  400 twice daily; Bactrim  Monday Wednesday Friday.  Tumor lysis prophylaxis-allopurinol  300 mg a day   # Lower back pain-secondary to presumed malignancy involving the L2/sacrum-started patient on fentanyl  patch 12.5 mcg; continue Percocet 1 pill every 6 hours as needed-- improved-again discussed importance of walking with support given involvement of the femur.  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    # Elevated AST ALT-question etiology.  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  Elevated alkaline phosphatase likely due to bone mets- check UGTA-1     # IV access: s/p Mediport placement.   # DISPOSITION: # chemo today- and follow up as planned- per IS- Dr.B

## 2023-12-10 NOTE — Progress Notes (Signed)
 Patient states he's doing well this morning. He also says he was a little restless last night due to today being his first treatment. Patient along with his wife would like to know the results of his PET that was done on 12/07/23.

## 2023-12-14 ENCOUNTER — Encounter: Payer: Self-pay | Admitting: Internal Medicine

## 2023-12-14 ENCOUNTER — Telehealth: Payer: Self-pay

## 2023-12-14 NOTE — Telephone Encounter (Signed)
Telephone call to patient for follow up after receiving first infusion.   Patient states infusion went great.  States eating good and drinking plenty of fluids.   Denies any nausea or vomiting.  Encouraged patient to call for any concerns or questions. 

## 2023-12-15 ENCOUNTER — Encounter
Admission: RE | Admit: 2023-12-15 | Discharge: 2023-12-15 | Disposition: A | Discharge status: Home / Self Care | Source: Ambulatory Visit | Attending: Internal Medicine | Admitting: Internal Medicine

## 2023-12-15 DIAGNOSIS — Z9221 Personal history of antineoplastic chemotherapy: Secondary | ICD-10-CM | POA: Insufficient documentation

## 2023-12-15 DIAGNOSIS — C8333 Diffuse large B-cell lymphoma, intra-abdominal lymph nodes: Secondary | ICD-10-CM | POA: Diagnosis present

## 2023-12-15 LAB — MISC LABCORP TEST (SEND OUT): Labcorp test code: 511200

## 2023-12-15 MED ORDER — TECHNETIUM TC 99M-LABELED RED BLOOD CELLS IV KIT
20.0000 | PACK | Freq: Once | INTRAVENOUS | Status: AC | PRN
  Administered 2023-12-15: 21.52 via INTRAVENOUS

## 2023-12-15 MED FILL — Fosaprepitant Dimeglumine For IV Infusion 150 MG (Base Eq): INTRAVENOUS | Qty: 5 | Status: AC

## 2023-12-17 ENCOUNTER — Inpatient Hospital Stay

## 2023-12-17 ENCOUNTER — Ambulatory Visit: Admitting: Internal Medicine

## 2023-12-17 ENCOUNTER — Encounter: Payer: Self-pay | Admitting: Interventional Radiology

## 2023-12-17 ENCOUNTER — Other Ambulatory Visit

## 2023-12-17 ENCOUNTER — Ambulatory Visit

## 2023-12-17 ENCOUNTER — Encounter: Payer: Self-pay | Admitting: Internal Medicine

## 2023-12-17 ENCOUNTER — Inpatient Hospital Stay (HOSPITAL_BASED_OUTPATIENT_CLINIC_OR_DEPARTMENT_OTHER): Admitting: Internal Medicine

## 2023-12-17 VITALS — BP 128/107 | HR 114 | Temp 98.3°F | Resp 18 | Ht 72.0 in | Wt 189.9 lb

## 2023-12-17 VITALS — BP 133/70 | HR 72 | Temp 97.0°F | Resp 18

## 2023-12-17 DIAGNOSIS — Z5112 Encounter for antineoplastic immunotherapy: Secondary | ICD-10-CM | POA: Diagnosis not present

## 2023-12-17 DIAGNOSIS — C8333 Diffuse large B-cell lymphoma, intra-abdominal lymph nodes: Secondary | ICD-10-CM

## 2023-12-17 LAB — CBC WITH DIFFERENTIAL (CANCER CENTER ONLY)
Abs Immature Granulocytes: 0.98 K/uL — ABNORMAL HIGH (ref 0.00–0.07)
Basophils Absolute: 0.1 K/uL (ref 0.0–0.1)
Basophils Relative: 1 %
Eosinophils Absolute: 0.1 K/uL (ref 0.0–0.5)
Eosinophils Relative: 0 %
HCT: 36.1 % — ABNORMAL LOW (ref 39.0–52.0)
Hemoglobin: 12.6 g/dL — ABNORMAL LOW (ref 13.0–17.0)
Immature Granulocytes: 4 %
Lymphocytes Relative: 16 %
Lymphs Abs: 3.8 K/uL (ref 0.7–4.0)
MCH: 32.5 pg (ref 26.0–34.0)
MCHC: 34.9 g/dL (ref 30.0–36.0)
MCV: 93 fL (ref 80.0–100.0)
Monocytes Absolute: 1.6 K/uL — ABNORMAL HIGH (ref 0.1–1.0)
Monocytes Relative: 7 %
Neutro Abs: 16.5 K/uL — ABNORMAL HIGH (ref 1.7–7.7)
Neutrophils Relative %: 72 %
Platelet Count: 324 K/uL (ref 150–400)
RBC: 3.88 MIL/uL — ABNORMAL LOW (ref 4.22–5.81)
RDW: 15.3 % (ref 11.5–15.5)
WBC Count: 23.1 K/uL — ABNORMAL HIGH (ref 4.0–10.5)
nRBC: 0 % (ref 0.0–0.2)

## 2023-12-17 LAB — CMP (CANCER CENTER ONLY)
ALT: 54 U/L — ABNORMAL HIGH (ref 0–44)
AST: 33 U/L (ref 15–41)
Albumin: 3.4 g/dL — ABNORMAL LOW (ref 3.5–5.0)
Alkaline Phosphatase: 109 U/L (ref 38–126)
Anion gap: 9 (ref 5–15)
BUN: 22 mg/dL (ref 8–23)
CO2: 26 mmol/L (ref 22–32)
Calcium: 9.1 mg/dL (ref 8.9–10.3)
Chloride: 100 mmol/L (ref 98–111)
Creatinine: 1.08 mg/dL (ref 0.61–1.24)
GFR, Estimated: >60 mL/min (ref >60)
Glucose, Bld: 87 mg/dL (ref 70–99)
Potassium: 4.1 mmol/L (ref 3.5–5.1)
Sodium: 135 mmol/L (ref 135–145)
Total Bilirubin: 1.6 mg/dL — ABNORMAL HIGH (ref 0.0–1.2)
Total Protein: 6.8 g/dL (ref 6.5–8.1)

## 2023-12-17 MED ORDER — SODIUM CHLORIDE 0.9 % IV SOLN
750.0000 mg/m2 | Freq: Once | INTRAVENOUS | Status: AC
  Administered 2023-12-17: 1500 mg via INTRAVENOUS
  Filled 2023-12-17: qty 75

## 2023-12-17 MED ORDER — SULFAMETHOXAZOLE-TRIMETHOPRIM 800-160 MG PO TABS: ORAL_TABLET | ORAL | 1 refills | Status: DC

## 2023-12-17 MED ORDER — PREDNISONE 50 MG PO TABS: ORAL_TABLET | ORAL | 5 refills | Status: DC

## 2023-12-17 MED ORDER — DOXORUBICIN HCL CHEMO IV INJECTION 2 MG/ML
50.0000 mg/m2 | Freq: Once | INTRAVENOUS | Status: AC
  Administered 2023-12-17: 106 mg via INTRAVENOUS
  Filled 2023-12-17: qty 53

## 2023-12-17 MED ORDER — SODIUM CHLORIDE 0.9 % IV SOLN
375.0000 mg/m2 | Freq: Once | INTRAVENOUS | Status: AC
  Administered 2023-12-17: 800 mg via INTRAVENOUS
  Filled 2023-12-17: qty 30

## 2023-12-17 MED ORDER — PALONOSETRON HCL INJECTION 0.25 MG/5ML
0.2500 mg | Freq: Once | INTRAVENOUS | Status: AC
  Administered 2023-12-17: 0.25 mg via INTRAVENOUS
  Filled 2023-12-17: qty 5

## 2023-12-17 MED ORDER — MONTELUKAST SODIUM 10 MG PO TABS
10.0000 mg | ORAL_TABLET | Freq: Once | ORAL | Status: AC
  Administered 2023-12-17: 10 mg via ORAL
  Filled 2023-12-17: qty 1

## 2023-12-17 MED ORDER — ACETAMINOPHEN 325 MG PO TABS
650.0000 mg | ORAL_TABLET | Freq: Once | ORAL | Status: AC
  Administered 2023-12-17: 650 mg via ORAL
  Filled 2023-12-17: qty 2

## 2023-12-17 MED ORDER — ACYCLOVIR 400 MG PO TABS: 400.0000 mg | ORAL_TABLET | Freq: Two times a day (BID) | ORAL | 3 refills | Status: AC

## 2023-12-17 MED ORDER — SODIUM CHLORIDE 0.9 % IV SOLN
INTRAVENOUS | Status: DC
  Filled 2023-12-17: qty 250

## 2023-12-17 MED ORDER — SODIUM CHLORIDE 0.9 % IV SOLN
150.0000 mg | Freq: Once | INTRAVENOUS | Status: AC
  Administered 2023-12-17: 150 mg via INTRAVENOUS
  Filled 2023-12-17: qty 150

## 2023-12-17 MED ORDER — DIPHENHYDRAMINE HCL 25 MG PO CAPS
50.0000 mg | ORAL_CAPSULE | Freq: Once | ORAL | Status: AC
  Administered 2023-12-17: 50 mg via ORAL
  Filled 2023-12-17: qty 2

## 2023-12-17 MED ORDER — FAMOTIDINE IN NACL 20-0.9 MG/50ML-% IV SOLN
20.0000 mg | Freq: Once | INTRAVENOUS | Status: AC
  Administered 2023-12-17: 20 mg via INTRAVENOUS
  Filled 2023-12-17: qty 50

## 2023-12-17 MED ORDER — SODIUM CHLORIDE 0.9 % IV SOLN
1.8000 mg/kg | Freq: Once | INTRAVENOUS | Status: AC
  Administered 2023-12-17: 170 mg via INTRAVENOUS
  Filled 2023-12-17: qty 7

## 2023-12-17 MED ORDER — ALLOPURINOL 300 MG PO TABS: 300.0000 mg | ORAL_TABLET | Freq: Two times a day (BID) | ORAL | 0 refills | Status: DC

## 2023-12-17 NOTE — Addendum Note (Signed)
 Addended by: JOSHUA ALFONSO CROME on: 12/17/2023 09:37 AM   Modules accepted: Orders

## 2023-12-17 NOTE — Patient Instructions (Signed)
 [facility] - A DEPT OF [facility]  Discharge Instructions: Thank you for choosing [facility] to provide your oncology and hematology care.  If you have a lab appointment with the Cancer Center, please go directly to the Cancer Center and check in at the registration area.  Wear comfortable clothing and clothing appropriate for easy access to any Portacath or PICC line.   We strive to give you quality time with your provider. You may need to reschedule your appointment if you arrive late (15 or more minutes).  Arriving late affects you and other patients whose appointments are after yours.  Also, if you miss three or more appointments without notifying the office, you may be dismissed from the clinic at the provider's discretion.      For prescription refill requests, have your pharmacy contact our office and allow 72 hours for refills to be completed.    Today you received the following chemotherapy and/or immunotherapy agents rituximab , polivy , doxorubicin , and cytoxan       To help prevent nausea and vomiting after your treatment, we encourage you to take your nausea medication as directed.  BELOW ARE SYMPTOMS THAT SHOULD BE REPORTED IMMEDIATELY: *FEVER GREATER THAN 100.4 F (38 C) OR HIGHER *CHILLS OR SWEATING *NAUSEA AND VOMITING THAT IS NOT CONTROLLED WITH YOUR NAUSEA MEDICATION *UNUSUAL SHORTNESS OF BREATH *UNUSUAL BRUISING OR BLEEDING *URINARY PROBLEMS (pain or burning when urinating, or frequent urination) *BOWEL PROBLEMS (unusual diarrhea, constipation, pain near the anus) TENDERNESS IN MOUTH AND THROAT WITH OR WITHOUT PRESENCE OF ULCERS (sore throat, sores in mouth, or a toothache) UNUSUAL RASH, SWELLING OR PAIN  UNUSUAL VAGINAL DISCHARGE OR ITCHING   Items with * indicate a potential emergency and should be followed up as soon as possible or go to the Emergency Department if any problems should occur.  Please show the  CHEMOTHERAPY ALERT CARD or IMMUNOTHERAPY ALERT CARD at check-in to the Emergency Department and triage nurse.  Should you have questions after your visit or need to cancel or reschedule your appointment, please contact [facility] - A DEPT OF [facility]  [phone] and follow the prompts.  Office hours are 8:00 a.m. to 4:30 p.m. Monday - Friday. Please note that voicemails left after 4:00 p.m. may not be returned until the following business day.  We are closed weekends and major holidays. You have access to a nurse at all times for urgent questions. Please call the main number to the clinic [phone] and follow the prompts.  For any non-urgent questions, you may also contact your provider using MyChart. We now offer e-Visits for anyone 73 and older to request care online for non-urgent symptoms. For details visit [email or web address].   Also download the MyChart app! Go to the app store, search MyChart, open the app, select [facility], and log in with your MyChart username and password.

## 2023-12-17 NOTE — Progress Notes (Signed)
 12/15/23 MUGA, needs results.  Should his clothes be washed separate from his wife's since on chemo?  Pt asking once his 1st round of chemo is done, will he have 2 weeks off?

## 2023-12-17 NOTE — Progress Notes (Signed)
 Nutrition Follow-up:  Patient with diffuse large B-Cell lymphoma.  Receiving POLA-R-CHP.    Met with patient and wife during infusion.  Patient reports that appetite is good.  Yesterday ate sausage biscuit for breakfast, chicken sandwich with milkshake for lunch and casserole dish with garlic toast for dinner.  Tried oral nutrition supplements and has boost and premier protein shakes on hand.     Medications: reviewed  Labs: reviewed  Anthropometrics:   Weight 189 lb 14.4 oz  190 lb 14.7 oz on 8/14 219 lb 9.6 oz on 10/08/23   NUTRITION DIAGNOSIS: Inadequate oral intake stable    INTERVENTION:  Continue well balanced diet including protein foods Continue oral nutrition supplements as needed     MONITORING, EVALUATION, GOAL: weight trends, intake   NEXT VISIT: Thursday, Sept 11 during infusion  Ruben Garcia B. Dasie SOLON, CSO, LDN Registered Dietitian [phone]

## 2023-12-17 NOTE — Progress Notes (Signed)
 [facility] CONSULT NOTE  Patient Care Team: Myrla Jon HERO, MD as PCP - General (Family Medicine) Verdene Gills, RN as Oncology Nurse Navigator [name], MD as Consulting Physician (Oncology)  CHIEF COMPLAINTS/PURPOSE OF CONSULTATION: Lung mass/pelvic masses concerning for cancer  Oncology History Overview Note    #  STAGE IV- JULY 2025- CT chest- Right hilar mass/mediastinal adenopathy.  MRI lumbar spine shows-L2 lesion.  MRI of the pelvis- Substantial metastatic burden to the bilateral iliac bones, right ischium, bilateral posterior acetabular walls, left pubic bone, central and right sacrum, bilateral femoral neck, and bilateral intertrochanteric region. In particular, tumor in the proximal femurs  Extraosseous extension of tumor from the dominant left iliac lesion, tracking along the left iliopsoas and upper pelvic sidewall; posteriorly along the gluteus minimus and gluteus medius, and even posteriorly along the lower paraspinal and medial left gluteus maximus musculature; Extraosseous extension of tumor from the right iliac lesion, tracking along the right iliopsoas and right gluteus minimus. Tumor along the attachment site of the right rectus femoris muscle, which could predispose to avulsion. Scattered small pelvic lymph nodes; Mildly prominent median lobe of prostate gland indents the bladder base.  12/07/2023- PET scan-   Hypermetabolic right hilar and subcarinal lymphadenopathy consistent with lymphoma. Hypermetabolic disease involving the left iliac bone and adjacent iliopsoas and gluteus musculature; Hypermetabolic disease involving the bony anatomy of the pelvis and proximal femurs bilaterally, proximal left femoral diaphysis, left-sided ribs, lumbar spine, and sacral spine as well as parasacral soft tissues.  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   KI-67 IS ESSENTIALLY 100%. TO EXCLUDE A SO-CALLED HIGH-GRADE B-CELL  LYMPHOMA/DOUBLE HIT LYMPHOMA FISH IS PENDING.   MUGA SCAN- 8/19-  hepatitis panel_NEG.     # AUG 14th, 2025-  rituximab  today-pending MUGA scan.   # AUG 21st, 2025- cycle #1 of chemotherapy POLA R-CHP   DLBCL (diffuse large B cell lymphoma) (HCC)  12/01/2023 Initial Diagnosis   DLBCL (diffuse large B cell lymphoma) (HCC)   12/01/2023 Cancer Staging   Staging form: Hodgkin and Non-Hodgkin Lymphoma, AJCC 8th Edition - Clinical: Stage IV (Diffuse large B-cell lymphoma) - Signed by Lorenzo Pereyra R, MD on 12/01/2023   12/10/2023 -  Chemotherapy   Patient is on Treatment Plan : NON-HODGKINS LYMPHOMA POLA-R-CHP D1 X 6 cycles / Rituximab  D1 q21d X 2 cycles       HISTORY OF PRESENTING ILLNESS: Patient ambulating-in with a cane. accompanied by wife.   Ruben Garcia 78 y.o.  male pleasant patient with a diffuse large B-cell lymphoma stage IV is here to proceed with chemotherapy.  Patient tolerated prechemotherapy-rituximab  infusions without any major side effects.  Patient is currently on prednisone  20 mg a day.  Patient taking intermittent Percocet.  Overall pain significantly improved.  Review of Systems  Constitutional:  Positive for malaise/fatigue and weight loss. Negative for chills, diaphoresis and fever.  HENT:  Negative for nosebleeds and sore throat.   Eyes:  Negative for double vision.  Respiratory:  Negative for hemoptysis, sputum production, shortness of breath and wheezing.   Cardiovascular:  Negative for chest pain, palpitations, orthopnea and leg swelling.  Gastrointestinal:  Negative for abdominal pain, blood in stool, constipation, diarrhea, heartburn, melena, nausea and vomiting.  Genitourinary:  Negative for dysuria, frequency and  urgency.  Musculoskeletal:  Positive for back pain, joint pain and  myalgias.  Skin: Negative.  Negative for itching and rash.  Neurological:  Negative for dizziness, tingling, focal weakness, weakness and headaches.  Endo/Heme/Allergies:  Does not bruise/bleed easily.  Psychiatric/Behavioral:  Negative for depression. The patient is not nervous/anxious and does not have insomnia.     MEDICAL HISTORY:  Past Medical History:  Diagnosis Date   Allergic rhinitis, cause unspecified    Basal cell carcinoma of skin, site unspecified 04/28/2010   Nasal; [place].   BPH (benign prostatic hypertrophy)    Colon polyps    Essential hypertension, benign    Hemorrhoids, internal    Incomplete bladder emptying    Other abnormal glucose    Over weight    Personal history of colonic polyps    Personal history of other genital system and obstetric disorders(V13.29)    Pure hypercholesterolemia    Unspecified disorder of kidney and ureter    Unspecified disorder of liver     SURGICAL HISTORY: Past Surgical History:  Procedure Laterality Date   BASAL CELL CARCINOMA EXCISION  2012   Facial        [place]   COLONOSCOPY  11/13/2010   single polyp; repeatin 5 years; [place]   COLONOSCOPY N/A 11/04/2021   Procedure: COLONOSCOPY;  Surgeon: Maryruth Ole DASEN, MD;  Location: [facility] ENDOSCOPY;  Service: Endoscopy;  Laterality: N/A;  REQUEST EARLY TIME   COLONOSCOPY WITH PROPOFOL  N/A 12/13/2015   Procedure: COLONOSCOPY WITH PROPOFOL ;  Surgeon: Lamar DASEN Viktoria, MD;  Location: [facility] ENDOSCOPY;  Service: Endoscopy;  Laterality: N/A;   ear surgery  05/2012   [name].  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  Benign.   HEMORRHOID SURGERY  1978   [name]   IR IMAGING GUIDED PORT INSERTION  12/04/2023   LAPAROSCOPIC CHOLECYSTECTOMY  1982   rotator cuff surgery Right 05/14/2016   VASECTOMY  1978    SOCIAL HISTORY: Social History   Socioeconomic History   Marital status: Married    Spouse name: Heron   Number of children: 2   Years of education: 2 years college   Highest education  level: Not on file  Occupational History   Occupation: retired    Comment: postal service in 2005  Tobacco Use   Smoking status: Never   Smokeless tobacco: Never  Vaping Use   Vaping status: Never Used  Substance and Sexual Activity   Alcohol use: Not Currently    Alcohol/week: 5.0 standard drinks of alcohol    Types: 5 Cans of beer per week    Comment: weekends beer, 4- 6 total Cleotilde Mallory   Drug use: No   Sexual activity: Not Currently    Partners: Female  Other Topics Concern   Not on file  Social History Narrative   Always uses seat belts. Smoke alarm and carbon monoxide detector in the home. Guns in the home stored in locked cabinet.Caffeine use: Coffee 2 servings per day, moderate amount. Exercise: Moderate walking 2 - 4 miles daily, 2 - 3 days per week.    Marital status:  Married x 47 years, happily.      Children:  2 children; 1 grandchild (20).      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      Tobacco: never      Alcohol:  Weekends; beer 4-6 per weekend.      Exercise:  Walking every other day 2.5 miles three times per day.  Seatbelt: 100%; no texting      Guns: secured guns.      Living Will: completed in 2014.  FULL CODE but no prolonged measures.        ADLs: independent with ADLs; no assistant devices   Social Drivers of Health   Financial Resource Strain: Not on file  Food Insecurity: No Food Insecurity (11/27/2023)   Hunger Vital Sign    Worried About Running Out of Food in the Last Year: Never true    Ran Out of Food in the Last Year: Never true  Transportation Needs: No Transportation Needs (11/27/2023)   PRAPARE - Administrator, Civil Service (Medical): No    Lack of Transportation (Non-Medical): No  Physical Activity: Not on file  Stress: Not on file  Social Connections: Unknown (11/19/2023)   Social Connection and Isolation Panel    Frequency of Communication with Friends and Family: Three times a week    Frequency of  Social Gatherings with Friends and Family: Three times a week    Attends Religious Services: Patient declined    Active Member of Clubs or Organizations: Patient declined    Attends Banker Meetings: Patient declined    Marital Status: Married  Catering manager Violence: Not At Risk (11/27/2023)   Humiliation, Afraid, Rape, and Kick questionnaire    Fear of Current or Ex-Partner: No    Emotionally Abused: No    Physically Abused: No    Sexually Abused: No    FAMILY HISTORY: Family History  Problem Relation Age of Onset   Heart disease Mother        first MI in 83s   Diabetes Mother    Hyperlipidemia Mother    Arthritis Mother        rheumatoid   Cancer Father        lung   Diabetes Father    Arthritis Sister    Hyperlipidemia Sister    Hyperlipidemia Sister    Hypertension Sister    Hyperlipidemia Sister    Kidney disease Neg Hx    Prostate cancer Neg Hx    Colon cancer Neg Hx     ALLERGIES:  is allergic to codeine.  MEDICATIONS:  Current Outpatient Medications  Medication Sig Dispense Refill   acetaminophen  (TYLENOL ) 325 MG tablet Take 2 tablets (650 mg total) by mouth every 6 (six) hours as needed for mild pain (pain score 1-3), fever, headache or moderate pain (pain score 4-6) (or Fever >/= 101).     acyclovir  (ZOVIRAX ) 400 MG tablet Take 1 tablet (400 mg total) by mouth 2 (two) times daily. [to prevent shingles] 60 tablet 3   albuterol  (VENTOLIN  HFA) 108 (90 Base) MCG/ACT inhaler Inhale 1-2 puffs into the lungs every 6 (six) hours as needed. 18 g 0   allopurinol  (ZYLOPRIM ) 300 MG tablet Take 1 tablet (300 mg total) by mouth 2 (two) times daily. 120 tablet 0   cyclobenzaprine  (FLEXERIL ) 10 MG tablet Take 1 tablet (10 mg total) by mouth 3 (three) times daily as needed for muscle spasms. 30 tablet 2   lidocaine -prilocaine  (EMLA ) cream Apply 1 Application topically as needed. Apply to port and cover with saran wrap 1-2 hours prior to port access 30 g 1    predniSONE  (DELTASONE ) 50 MG tablet Take 2 tablets once day x 5 days. START on day of your chemotherapy. Take in AM; with FOOD. 10 tablet 5   sulfamethoxazole -trimethoprim  (BACTRIM  DS) 800-160 MG tablet One pill on Monday;wed;Friday  30 tablet 1   Cyanocobalamin  (VITAMIN B 12) 500 MCG TABS Take 100 mg by mouth daily at 6 (six) AM. (Patient not taking: Reported on 12/10/2023)     fentaNYL  (DURAGESIC ) 25 MCG/HR Place 1 patch onto the skin every 3 (three) days. (Patient not taking: Reported on 12/10/2023) 10 patch 0   fluticasone  (FLONASE ) 50 MCG/ACT nasal spray Place 2 sprays into the nose. (Patient not taking: Reported on 12/10/2023)     gabapentin  (NEURONTIN ) 300 MG capsule Take 1 capsule (300 mg total) by mouth at bedtime for 7 days, THEN 1 capsule (300 mg total) 2 (two) times daily. (Patient not taking: No sig reported) 73 capsule 0   lisinopril  (ZESTRIL ) 40 MG tablet Take 1 tablet by mouth once daily (Patient not taking: Reported on 12/10/2023) 90 tablet 0   Multiple Vitamins-Minerals (MULTIVITAMIN PO) Take by mouth daily. (Patient not taking: Reported on 12/10/2023)     ondansetron  (ZOFRAN ) 4 MG tablet Take 1 tablet (4 mg total) by mouth every 8 (eight) hours as needed for nausea or vomiting. (Patient not taking: Reported on 12/10/2023) 30 tablet 0   oxyCODONE -acetaminophen  (PERCOCET) 5-325 MG tablet Take 1 tablet by mouth every 6 (six) hours as needed for severe pain (pain score 7-10). Must last 30 days. (Patient not taking: Reported on 12/10/2023) 90 tablet 0   polyethylene glycol (MIRALAX ) 17 g packet Take 17 g by mouth daily as needed. (Patient not taking: Reported on 12/10/2023) 30 each 0   rosuvastatin  (CRESTOR ) 5 MG tablet Take 1 tablet (5 mg total) by mouth daily. (Patient not taking: Reported on 12/10/2023) 90 tablet 1   vitamin E  400 UNIT capsule Take 400 Units by mouth daily. (Patient not taking: Reported on 12/10/2023)     No current facility-administered medications for this visit.    Facility-Administered Medications Ordered in Other Visits  Medication Dose Route Frequency Provider Last Rate Last Admin   0.9 %  sodium chloride  infusion   Intravenous Continuous Estera Ozier R, MD 10 mL/hr at 12/17/23 0921 New Bag at 12/17/23 9078   acetaminophen  (TYLENOL ) tablet 650 mg  650 mg Oral Once Amylah Will R, MD       cyclophosphamide  (CYTOXAN ) 1,500 mg in sodium chloride  0.9 % 250 mL chemo infusion  750 mg/m2 (Treatment Plan Recorded) Intravenous Once Arihana Ambrocio R, MD       diphenhydrAMINE  (BENADRYL ) capsule 50 mg  50 mg Oral Once Yuji Walth R, MD       DOXOrubicin  (ADRIAMYCIN ) chemo injection 106 mg  50 mg/m2 (Treatment Plan Recorded) Intravenous Once Rakayla Ricklefs R, MD       famotidine  (PEPCID ) IVPB 20 mg premix  20 mg Intravenous Once Marcella Charlson R, MD       fosaprepitant  (EMEND) 150 mg in sodium chloride  0.9 % 145 mL IVPB  150 mg Intravenous Once Kytzia Gienger R, MD 450 mL/hr at 12/17/23 0922 150 mg at 12/17/23 9077   montelukast  (SINGULAIR ) tablet 10 mg  10 mg Oral Once Colton Tassin R, MD       palonosetron  (ALOXI ) injection 0.25 mg  0.25 mg Intravenous Once Ileigh Mettler R, MD       polatuzumab vedotin -piiq (POLIVY ) 170 mg in sodium chloride  0.9 % 100 mL (1.5668 mg/mL) chemo infusion  1.8 mg/kg (Treatment Plan Recorded) Intravenous Once Nyzir Dubois R, MD       riTUXimab -pvvr (RUXIENCE ) 800 mg in sodium chloride  0.9 % 170 mL infusion  375 mg/m2 (Treatment Plan Recorded) Intravenous Once Aleksandr Pellow  R, MD        PHYSICAL EXAMINATION:   Vitals:   12/17/23 0828 12/17/23 0839  BP: (!) 137/96 (!) 128/107  Pulse: (!) 114   Resp: 18   Temp: 98.3 F (36.8 C)   SpO2: 100%    Filed Weights   12/17/23 0828  Weight: 189 lb 14.4 oz (86.1 kg)   Patient is wheezing bilaterally.  Physical Exam Vitals and nursing note reviewed.  HENT:     Head: Normocephalic and atraumatic.     Mouth/Throat:      Pharynx: Oropharynx is clear.  Eyes:     Extraocular Movements: Extraocular movements intact.     Pupils: Pupils are equal, round, and reactive to light.  Cardiovascular:     Rate and Rhythm: Normal rate and regular rhythm.  Pulmonary:     Comments: Decreased breath sounds bilaterally.  Abdominal:     Palpations: Abdomen is soft.  Musculoskeletal:        General: Normal range of motion.     Cervical back: Normal range of motion.  Skin:    General: Skin is warm.  Neurological:     General: No focal deficit present.     Mental Status: He is alert and oriented to person, place, and time.  Psychiatric:        Behavior: Behavior normal.        Judgment: Judgment normal.     LABORATORY DATA:  I have reviewed the data as listed Lab Results  Component Value Date   WBC 23.1 (H) 12/17/2023   HGB 12.6 (L) 12/17/2023   HCT 36.1 (L) 12/17/2023   MCV 93.0 12/17/2023   PLT 324 12/17/2023   Recent Labs    03/31/23 0930 10/08/23 0900 11/19/23 1347 11/20/23 0511 12/01/23 1351 12/10/23 0816 12/17/23 0819  NA 143   < >  --    < > 131* 133* 135  K 4.9   < >  --    < > 4.3 4.1 4.1  CL 103   < >  --    < > 100 101 100  CO2 26   < >  --    < > 20* 24 26  GLUCOSE 103*   < >  --    < > 141* 87 87  BUN 11   < >  --    < > 26* 26* 22  CREATININE 1.21   < >  --    < > 1.64* 1.01 1.08  CALCIUM  10.0   < >  --    < > 9.1 8.9 9.1  GFRNONAA  --    < >  --    < > 43* >60 >60  PROT 7.4   < >  --    < > 7.6 6.8 6.8  ALBUMIN 4.7   < >  --    < > 3.8 3.4* 3.4*  AST 34   < >  --    < > 42* 43* 33  ALT 38   < >  --    < > 50* 99* 54*  ALKPHOS 52   < >  --    < > 291* 141* 109  BILITOT 1.7*  1.9*   < > 2.5*   < > 1.7* 1.4* 1.6*  BILIDIR 0.40  --  0.3*  --   --   --   --   IBILI 1.30*  --  2.2*  --   --   --   --    < > =  values in this interval not displayed.    RADIOGRAPHIC STUDIES: I have personally reviewed the radiological images as listed and agreed with the findings in the  report. NM Cardiac Muga Rest Result Date: 12/15/2023 CLINICAL DATA:  Large B-cell lymphoma.  Assess cardiac function EXAM: NUCLEAR MEDICINE CARDIAC BLOOD POOL IMAGING (MUGA) TECHNIQUE: Cardiac multi-gated acquisition was performed at rest following intravenous injection of Tc-50m labeled red blood cells. RADIOPHARMACEUTICALS:  21.5 mCi Tc-70m pertechnetate in-vitro labeled red blood cells IV COMPARISON:  None Available. FINDINGS: No  focal wall motion abnormality of the left ventricle. Calculated left ventricular ejection fraction equals 53.5 % IMPRESSION: Left ventricular ejection fraction equals53.5 %. Electronically Signed   By: Jackquline Boxer M.D.   On: 12/15/2023 13:45   NM PET Image Initial (PI) Whole Body Result Date: 12/07/2023 CLINICAL DATA:  Initial treatment strategy for diffuse large B-cell lymphoma. EXAM: NUCLEAR MEDICINE PET WHOLE BODY TECHNIQUE: 10.8 mCi F-18 FDG was injected intravenously. Full-ring PET imaging was performed from the head to foot after the radiotracer. CT data was obtained and used for attenuation correction and anatomic localization. Fasting blood glucose: 117 mg/dl COMPARISON:  Chest CT 92/75/7974. MRI lumbar spine 11/19/2023. MR pelvis 11/21/2023. FINDINGS: Mediastinal blood pool activity: SUV max 2.8 Liver activity: SUV max = 3.5 HEAD/NECK: No hypermetabolic activity in the scalp. No hypermetabolic cervical lymph nodes. Incidental CT findings: none CHEST: Previously characterized right none hilar nodal mass is hypermetabolic with SUV max = 17.7. Subcarinal hypermetabolism demonstrates SUV max = 12.4. No left hilar or axillary hypermetabolism. Incidental CT findings: There is mild atherosclerotic calcification of the abdominal aorta without aneurysm. Right Port-A-Cath tip is positioned at the junction of the SVC and RA. Coronary artery calcification is evident. No suspicious pulmonary nodule or mass. No focal consolidation or effusion. ABDOMEN/PELVIS: No abnormal  hypermetabolic activity within the liver, pancreas, adrenal glands, or spleen. No hypermetabolic lymph nodes in the abdomen or pelvis. Disease involving the left iliac bone in adjacent iliopsoas and gluteus musculature demonstrates SUV max = 33.2. Right-sided presacral disease demonstrates SUV max = 27.6. Hypermetabolic lesions are seen in the humeral heads bilaterally posterior left fourth rib, posterior left twelfth rib, lumbar spine and bony/muscular anatomy of the pelvis better characterized on recent MRI. Hypermetabolic lesions are seen in the femoral necks bilaterally and in the pubic rami. Incidental CT findings: Changes in the bony anatomy and pelvic muscular anatomy are relatively subtle on noncontrast CT imaging compared to the degree of hypermetabolism. There is some asymmetry of pelvic musculature compatible disease involvement. SKELETON: See above. Incidental CT findings: See above. EXTREMITIES: Hypermetabolic marrow lesion identified in the left femoral diaphysis. No suspicious hypermetabolism below the knees. Incidental CT findings: none IMPRESSION: 1. Hypermetabolic right hilar and subcarinal lymphadenopathy consistent with lymphoma. 2. Hypermetabolic disease involving the left iliac bone and adjacent iliopsoas and gluteus musculature. 3. Hypermetabolic disease involving the bony anatomy of the pelvis and proximal femurs bilaterally, proximal left femoral diaphysis, left-sided ribs, lumbar spine, and sacral spine as well as parasacral soft tissues. 4.  Aortic Atherosclerosis (ICD10-I70.0). Electronically Signed   By: Camellia Candle M.D.   On: 12/07/2023 16:14   IR IMAGING GUIDED PORT INSERTION Result Date: 12/04/2023 INDICATION: Diffuse large B-cell lymphoma. EXAM: FLUOROSCOPIC AND ULTRASOUND GUIDED PLACEMENT OF A SUBCUTANEOUS PORT MEDICATIONS: Moderate sedation ANESTHESIA/SEDATION: Moderate (conscious) sedation was employed during this procedure. A total of Versed  2 mg and fentanyl  100 mcg was  administered intravenously at the order of the provider performing the procedure. Total intra-service moderate  sedation time: 32 minutes. Patient's level of consciousness and vital signs were monitored continuously by radiology nurse throughout the procedure under the supervision of the provider performing the procedure. FLUOROSCOPY TIME:  Radiation Exposure Index (as provided by the fluoroscopic device): 1 mGy Kerma COMPLICATIONS: None immediate. PROCEDURE: The procedure, risks, benefits, and alternatives were explained to the patient. Questions regarding the procedure were encouraged and answered. The patient understands and consents to the procedure. Patient was placed supine on the interventional table. Ultrasound confirmed a patent right internal jugular vein. Ultrasound image was saved for documentation. The right chest and neck were cleaned with a skin antiseptic and a sterile drape was placed. Maximal barrier sterile technique was utilized including caps, mask, sterile gowns, sterile gloves, sterile drape, hand hygiene and skin antiseptic. The right neck was anesthetized with 1% lidocaine . Small incision was made in the right neck with a blade. Micropuncture set was placed in the right internal jugular vein with ultrasound guidance. The micropuncture wire was used for measurement purposes. The right chest was anesthetized with 1% lidocaine  with epinephrine . #15 blade was used to make an incision and a subcutaneous port pocket was formed. 8 french Power Port was assembled. Subcutaneous tunnel was formed with a stiff tunneling device. The port catheter was brought through the subcutaneous tunnel. The port was placed in the subcutaneous pocket. The micropuncture set was exchanged for a peel-away sheath. The catheter was placed through the peel-away sheath and the tip was positioned at the superior cavoatrial junction. Catheter placement was confirmed with fluoroscopy. The port was accessed and flushed with  heparinized saline. The port pocket was closed using two layers of absorbable sutures and Dermabond. The vein skin site was closed using a single layer of absorbable suture and Dermabond. Sterile dressings were applied. Patient tolerated the procedure well without an immediate complication. Ultrasound and fluoroscopic images were taken and saved for this procedure. IMPRESSION: Placement of a subcutaneous power-injectable port device. Catheter tip at the superior cavoatrial junction. Electronically Signed   By: Juliene Balder M.D.   On: 12/04/2023 09:46   CT BIOPSY Result Date: 11/24/2023 CLINICAL DATA:  Right hilar lung mass with lymphadenopathy and multiple bone lesions including the left iliac bone and soft tissue masses involving the left iliopsoas and gluteal musculature by MRI. EXAM: CT GUIDED CORE BIOPSY OF LEFT GLUTEAL SOFT TISSUE MASS ANESTHESIA/SEDATION: Moderate (conscious) sedation was employed during this procedure. A total of Versed  2.0 mg and Fentanyl  150 mcg was administered intravenously. Moderate Sedation Time: 17 minutes. The patient's level of consciousness and vital signs were monitored continuously by radiology nursing throughout the procedure under my direct supervision. PROCEDURE: The procedure risks, benefits, and alternatives were explained to the patient. Questions regarding the procedure were encouraged and answered. The patient understands and consents to the procedure. A time out was performed prior to initiating the procedure. RADIATION DOSE REDUCTION: This exam was performed according to the departmental dose-optimization program which includes automated exposure control, adjustment of the mA and/or kV according to patient size and/or use of iterative reconstruction technique. The left pelvic region was prepped with chlorhexidine  in a sterile fashion, and a sterile drape was applied covering the operative field. A sterile gown and sterile gloves were used for the procedure. Local  anesthesia was provided with 1% Lidocaine . CT of the pelvis was performed in a supine position. Under CT guidance, a 17 gauge trocar needle was advanced to the level of a lateral left soft tissue mass deep to the gluteal musculature  and abutting the left iliac bone. After confirming needle tip position, 3 separate coaxial 18 gauge core biopsy samples were obtained and submitted in formalin. Some Gel-Foam pledgets were advanced through the outer needle prior to its removal. COMPLICATIONS: None FINDINGS: Smooth, rounded configuration mass extending lateral to the left iliac bone and ileum measures up to approximately 4.1 x 7.2 cm in maximum transverse dimensions by unenhanced CT and lies deep to the gluteus minimus muscle. Biopsy was performed yielding solid tissue. IMPRESSION: CT-guided biopsy of left gluteal region soft tissue mass deep to the gluteus minimus muscle measuring just over approximately 7 cm in diameter by CT. Electronically Signed   By: Marcey Moan M.D.   On: 11/24/2023 13:39   MR PELVIS W WO CONTRAST Result Date: 11/21/2023 CLINICAL DATA:  Right hilar mass with osseous metastatic disease, including pelvic tumor EXAM: MRI PELVIS WITHOUT AND WITH CONTRAST TECHNIQUE: Multiplanar multisequence MR imaging of the pelvis was performed both before and after administration of intravenous contrast. CONTRAST:  9mL GADAVIST  GADOBUTROL  1 MMOL/ML IV SOLN COMPARISON:  Lumbar MRI 11/19/2023 FINDINGS: OSSEOUS STRUCTURES AND JOINTS: Substantial metastatic burden to the bilateral iliac bones, right ischium, bilateral posterior acetabular walls, left pubic bone,, central and right sacrum, bilateral femoral neck, and bilateral intertrochanteric region. In particular, tumor in the proximal femurs predisposes the patient to pathologic fracture and special cautions with weight-bearing activities is recommended. Regarding the dominant left iliac lesion, there is extensive extraosseous extension of tumor both anteriorly  along the iliopsoas and upper pelvic sidewall; posteriorly along the gluteus minimus and gluteus medius, and even posteriorly along the lower paraspinal and medial left gluteus maximus musculature. A small amount of tumor is present along the left sciatic notch but does not visibly impinge on the left sciatic nerve. Likewise there is extraosseous extension of tumor anteriorly associated with the right sacral lesion, partially tracking along the upper margin of the right piriformis muscle. Mild extraosseous extension of tumor anteriorly and posteriorly from the right iliac lesion is shown on image 20 of series 14, tracking along the right iliopsoas and right gluteus minimus. There is tumor along the attachment site of the right rectus femoris muscle, which could predispose to avulsion. MUSCULOTENDINOUS: Muscular involvement by extraosseous extension of tumor as noted above. No separate purely muscular metastatic lesion identified. There is muscular edema involving portions of the bilateral iliopsoas musculature as well as left greater than right gluteal musculature and hip adductor musculature, likely related to the above described tumors. SACRAL PLEXUS: Tumor from the left iliac bone is tangential to components of the left sacral plexus example on image 19 of series 8 but does not demonstrably involve the sacral plexus at this time. OTHER: Scattered small pelvic lymph nodes, one of the largest is a 0.9 cm left iliac node on image 17 series 14. Mild presacral edema likely related to the sacral metastatic lesion. Mildly prominent median lobe of prostate gland indents the bladder base. Mildly fatty left spermatic cord. IMPRESSION: 1. Substantial metastatic burden to the bilateral iliac bones, right ischium, bilateral posterior acetabular walls, left pubic bone, central and right sacrum, bilateral femoral neck, and bilateral intertrochanteric region. In particular, tumor in the proximal femurs predisposes the patient to  pathologic fracture and special cautions with weight-bearing activities is recommended. 2. Extraosseous extension of tumor from the dominant left iliac lesion, tracking along the left iliopsoas and upper pelvic sidewall; posteriorly along the gluteus minimus and gluteus medius, and even posteriorly along the lower paraspinal and medial left  gluteus maximus musculature. 3. Extraosseous extension of tumor from the right iliac lesion, tracking along the right iliopsoas and right gluteus minimus. 4. Tumor along the attachment site of the right rectus femoris muscle, which could predispose to avulsion. 5. Scattered small pelvic lymph nodes, one of the largest is a 0.9 cm left iliac node. 6. Mildly prominent median lobe of prostate gland indents the bladder base. 7. Mildly fatty left spermatic cord. Electronically Signed   By: Ryan Salvage M.D.   On: 11/21/2023 12:09   MR Lumbar Spine W Wo Contrast Result Date: 11/19/2023 CLINICAL DATA:  Low back pain, cancer suspected EXAM: MRI LUMBAR SPINE WITHOUT AND WITH CONTRAST TECHNIQUE: Multiplanar and multiecho pulse sequences of the lumbar spine were obtained without and with intravenous contrast. CONTRAST:  9mL GADAVIST  GADOBUTROL  1 MMOL/ML IV SOLN COMPARISON:  None Available. FINDINGS: Segmentation:  Standard. Alignment:  Physiologic. Vertebrae: T1 hypointense, stir hyperintense, and enhancing lesions at L2 and involving the partially imaged sacral vertebral bodies and sacrum, compatible with metastases. Conus medullaris and cauda equina: Conus extends to the L1 level. Conus and cauda equina appear normal. Paraspinal and other soft tissues: Partially imaged suspected extraosseous extension of tumor involving the pelvis on the left and possibly the right. Disc levels: T12-L1: No significant disc protrusion, foraminal stenosis, or canal stenosis. L1-L2: No significant disc protrusion, foraminal stenosis, or canal stenosis. L2-L3: No significant disc protrusion,  foraminal stenosis, or canal stenosis. L3-L4: Mild facet arthropathy.  No significant stenosis. L4-L5: Moderate facet arthropathy and mild disc bulging. Mild left foraminal stenosis. Patent canal. L5-S1: Moderate bilateral facet arthropathy and mild disc bulging. Mild right foraminal stenosis. Pain canal. IMPRESSION: 1. 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 2. Partially imaged suspected extraosseous extension of tumor involving the pelvis on the left and potentially the right. Recommend MRI of the pelvis with contrast for further evaluation. Electronically Signed   By: Gilmore GORMAN Molt M.D.   On: 11/19/2023 23:53   CT CHEST WO CONTRAST Result Date: 11/19/2023 CLINICAL DATA:  Generalized weakness. Dyspnea. Concern for lung cancer. * Tracking Code: BO * EXAM: CT CHEST WITHOUT CONTRAST TECHNIQUE: Multidetector CT imaging of the chest was performed following the standard protocol without IV contrast. RADIATION DOSE REDUCTION: This exam was performed according to the departmental dose-optimization program which includes automated exposure control, adjustment of the mA and/or kV according to patient size and/or use of iterative reconstruction technique. COMPARISON:  Plain film of earlier today.  No prior CT FINDINGS: Cardiovascular: Aortic atherosclerosis. Tortuous thoracic aorta. Normal heart size, without pericardial effusion. Left main and 3 vessel coronary artery calcification. Mediastinum/Nodes: No supraclavicular adenopathy. Subcarinal nodal mass measures 1.6 x 3.2 cm on 69/3. Right hilar nodal mass is poorly delineated secondary to noncontrast technique. Estimated at 3.2 x 2.8 cm on 68/3, causing endobronchial compression as detailed below. Lungs/Pleura: No pleural fluid. Endobronchial marked narrowing, near obstruction involving the bronchus intermedius including on 70/4. More mild narrowing of the proximal right middle and right lower lobe  bronchi. Minimal exclusion of the inferior most right lung base. 1.1 x 1.3 cm soft tissue density within the anterior most right lower lobe is favored to be pulmonary parenchymal nodule over an infrahilar node. Example on image 68/4 Scattered calcified granulomas. Upper Abdomen: Nonspecific caudate lobe enlargement. Cholecystectomy. Normal imaged portions of the spleen, stomach, pancreas, adrenal glands, left kidney. Musculoskeletal: Mid and lower thoracic spondylosis. IMPRESSION: 1. 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 The dominant right hilar nodal mass causes narrowing of the bronchus intermedius. Recommend multidisciplinary thoracic oncology consultation for eventual PET and tissue sampling. Alternatively, sampling via bronchoscopy could be performed. 2. 1.3 cm soft tissue density along the anterior most right lower lobe is favored to represent a parenchymal pulmonary nodule and is most likely the site of primary. An enlarged infrahilar node could look similar. 3. Incidental findings, including: Coronary artery atherosclerosis. Aortic Atherosclerosis (ICD10-I70.0). Electronically Signed   By: Rockey Kilts M.D.   On: 11/19/2023 12:44   DG Chest 2 View Result Date: 11/19/2023 CLINICAL DATA:  weakness EXAM: CHEST - 2 VIEW COMPARISON:  October 16, 2023 FINDINGS: No focal airspace consolidation, pleural effusion, or pneumothorax. No cardiomegaly. Tortuous aorta with aortic atherosclerosis. No acute fracture or destructive lesions. Multilevel thoracic osteophytosis. IMPRESSION: No acute cardiopulmonary abnormality. Electronically Signed   By: Rogelia Myers M.D.   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  MRI lumbar spine shows-L2 lesion.  MRI of the pelvis- Substantial metastatic burden to the bilateral  iliac bones, right ischium, bilateral posterior acetabular walls, left pubic bone, central and right sacrum, bilateral femoral neck, and bilateral intertrochanteric region. In particular, tumor in the proximal femurs  Extraosseous extension of tumor from the dominant left iliac lesion, tracking along the left iliopsoas and upper pelvic sidewall; posteriorly along the gluteus minimus and gluteus medius, and even posteriorly along the lower paraspinal and medial left gluteus maximus musculature; Extraosseous extension of tumor from the right iliac lesion, tracking along the right iliopsoas and right gluteus minimus. Tumor along the attachment site of the right rectus femoris muscle, which could predispose to avulsion. Scattered small pelvic lymph nodes; Mildly prominent median lobe of prostate gland indents the bladder base.  12/07/2023- PET scan-   Hypermetabolic right hilar and subcarinal lymphadenopathy consistent with lymphoma. Hypermetabolic disease involving the left iliac bone and adjacent iliopsoas and gluteus musculature; Hypermetabolic disease involving the bony anatomy of the pelvis and proximal femurs bilaterally, proximal left femoral diaphysis, left-sided ribs, lumbar spine, and sacral spine as well as parasacral soft tissues.   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   KI-67 IS ESSENTIALLY 100%. TO EXCLUDE A SO-CALLED HIGH-GRADE B-CELL  LYMPHOMA/DOUBLE HIT LYMPHOMA FISH IS PENDING-reached out to path.   hepatitis panel NEG. AUG 19th, 2025-  MUGA scan- left ventricular ejection fraction equals 53.5 %. 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  Discussed therapies cure. I would do  interim scan after 2-3 cycles of chemo.    # ID prophylaxis-recommend acyclovir  400 twice daily; Bactrim  Monday Wednesday Friday.  Tumor lysis prophylaxis-allopurinol  300 mg a day   # Lower back pain-secondary to presumed malignancy involving the L2/sacrum--again discussed importance of walking with support given involvement of the femur. Stable. continue Percocet prn.   # ELEVATED BP-secondary to steroids/anxiety.  Recommend repeating blood pressures at home.  Call us  if still elevated.re-start lisinopril .   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  Calcitriol  level  198- paraneoplastic- monitor for znow- zometa  as needed.    # Elevated AST ALT-question etiology.   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   # IV access: s/p Mediport placement.   PS-  # DISPOSITION: # chemo today-; d-2 injection #  follow in 3 weeks- MD; labs- cbc/cmp; LDH; chemo- d-2 injection-    Dr.B     Above plan of care was discussed with patient/family in detail.  My contact information was given to the patient/family.     Cindy JONELLE Joe, MD 12/17/2023 9:24 AM

## 2023-12-17 NOTE — Progress Notes (Signed)
 Rapid Infusion Rituximab  Pharmacist Evaluation  SIRE POET is a 78 y.o. male being treated with rituximab  for non-Hodgkins Lymphoma. This patient may be considered for RIR.   A pharmacist has verified the patient tolerated rituximab  infusions per the [facility] standard infusion protocol without grade 3-4 infusion reactions. The treatment plan will be updated to reflect RIR if the patient qualifies per the checklist below:   Age > 30 years old Yes   Clinically significant cardiovascular disease No   Circulating lymphocyte count < 5000/uL prior to cycle two Yes 3.1 12/17/23 Lab Results  Component Value Date   LYMPHSABS 5.1 (H) 12/10/2023    Prior documented grade 3-4 infusion reaction to rituximab  No   Prior documented grade 1-2 infusion reaction to rituximab  (If YES, Pharmacist will confirm with Physician if patient is still a candidate for RIR) No   Previous rituximab  infusion within the past 6 months Yes   Treatment Plan updated orders to reflect RIR Yes    Elsie JINNY Leech does meet the criteria for Rapid Infusion Rituximab . This patient is going to be switched to rapid infusion rituximab .   Maudie FORBES Andreas, PharmD, BCPS Clinical Pharmacist   12/17/23 8:21 AM

## 2023-12-17 NOTE — Progress Notes (Signed)
 Patient with gilbert sydrome -- OK to continue with elevated T. Bili without dose adjustments per Dr. Rennie Maudie FORBES Darcie, PharmD, BCPS Clinical Pharmacist

## 2023-12-17 NOTE — Assessment & Plan Note (Addendum)
#    STAGE IV- JULY 2025- CT chest- Right hilar mass/mediastinal adenopathy.  MRI lumbar spine shows-L2 lesion.  MRI of the pelvis- Substantial metastatic burden to the bilateral iliac bones, right ischium, bilateral posterior acetabular walls, left pubic bone, central and right sacrum, bilateral femoral neck, and bilateral intertrochanteric region. In particular, tumor in the proximal femurs  Extraosseous extension of tumor from the dominant left iliac lesion, tracking along the left iliopsoas and upper pelvic sidewall; posteriorly along the gluteus minimus and gluteus medius, and even posteriorly along the lower paraspinal and medial left gluteus maximus musculature; Extraosseous extension of tumor from the right iliac lesion, tracking along the right iliopsoas and right gluteus minimus. Tumor along the attachment site of the right rectus femoris muscle, which could predispose to avulsion. Scattered small pelvic lymph nodes; Mildly prominent median lobe of prostate gland indents the bladder base.  12/07/2023- PET scan-   Hypermetabolic right hilar and subcarinal lymphadenopathy consistent with lymphoma. Hypermetabolic disease involving the left iliac bone and adjacent iliopsoas and gluteus musculature; Hypermetabolic disease involving the bony anatomy of the pelvis and proximal femurs bilaterally, proximal left femoral diaphysis, left-sided ribs, lumbar spine, and sacral spine as well as parasacral soft tissues.   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   KI-67 IS ESSENTIALLY 100%. TO EXCLUDE A SO-CALLED HIGH-GRADE B-CELL  LYMPHOMA/DOUBLE HIT LYMPHOMA FISH IS PENDING-reached out to path.   hepatitis panel NEG. AUG 19th, 2025-  MUGA scan- left ventricular ejection fraction equals 53.5 %. UGTA- Two copies of  the *28 allele were detected in this individual (homozygous pattern)   # proceed with POLA R-CHP chemotherapy every 3 weeks x 6 cycles.  Discussed therapies cure. I would do interim scan after 2-3 cycles of chemo.    # ID prophylaxis-recommend acyclovir  400 twice daily; Bactrim  Monday Wednesday Friday.  Tumor lysis prophylaxis-allopurinol  300 mg a day   # Lower back pain-secondary to presumed malignancy involving the L2/sacrum--again discussed importance of walking with support given involvement of the femur. Stable. continue Percocet prn.   # ELEVATED BP-secondary to steroids/anxiety.  Recommend repeating blood pressures at home.  Call us  if still elevated.re-start lisinopril .   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    # Elevated AST ALT-question etiology.   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   # IV access: s/p Mediport placement.   PS-  # DISPOSITION: # chemo today-; d-2 injection #  follow in 3 weeks- MD; labs- cbc/cmp; LDH; chemo- d-2 injection-    Dr.B

## 2023-12-18 ENCOUNTER — Inpatient Hospital Stay

## 2023-12-18 DIAGNOSIS — Z5112 Encounter for antineoplastic immunotherapy: Secondary | ICD-10-CM | POA: Diagnosis not present

## 2023-12-18 DIAGNOSIS — C8333 Diffuse large B-cell lymphoma, intra-abdominal lymph nodes: Secondary | ICD-10-CM

## 2023-12-18 MED ORDER — PEGFILGRASTIM-JMDB 6 MG/0.6ML ~~LOC~~ SOSY
6.0000 mg | PREFILLED_SYRINGE | Freq: Once | SUBCUTANEOUS | Status: AC
Start: 2023-12-18 — End: 2023-12-18
  Administered 2023-12-18: 6 mg via SUBCUTANEOUS
  Filled 2023-12-18: qty 0.6

## 2023-12-21 ENCOUNTER — Telehealth: Payer: Self-pay | Admitting: *Deleted

## 2023-12-21 NOTE — Telephone Encounter (Signed)
 Note to the team for Dr. Rennie and says he should get that MiraLAX  that he already has at home and take 1 now wait about 3 hours take another 1 at 1 take another 1.  If this does not work just give us  a call back

## 2023-12-22 ENCOUNTER — Telehealth: Payer: Self-pay | Admitting: *Deleted

## 2023-12-22 ENCOUNTER — Encounter: Admitting: Student in an Organized Health Care Education/Training Program

## 2023-12-22 MED ORDER — LACTULOSE 10 GM/15ML PO SOLN: 10.0000 g | Freq: Every day | ORAL | 0 refills | Status: AC | PRN

## 2023-12-22 NOTE — Telephone Encounter (Signed)
 The patient is drinking water and some other things and he did the MiraLAX  as it never helped the constipation.  He said last night he had to take it out with his fingers.  He would like to have another medicine to see if that could help with the constipation.  He is miserable he says

## 2023-12-22 NOTE — Telephone Encounter (Signed)
 I spoke with Dr. Mela and he said they will send him some lactulose  and he can have 3 a day and also he should get a suppository and you can get it from over-the-counter Dulcolax.  I suggested in the evening to do the Dulcolax but the patient said that he tried that yesterday they might as far as trying to get the stool out and it is did not give him a good night sleep.  I told him that if that is the case start with the lactulose  and you can see if any things happening by evening time and then you can determine if you want another lactulose  or try the Dulcolax to.  He will work it out

## 2023-12-24 ENCOUNTER — Ambulatory Visit: Admitting: Family Medicine

## 2023-12-25 ENCOUNTER — Telehealth: Payer: Self-pay | Admitting: *Deleted

## 2023-12-25 MED ORDER — PROCHLORPERAZINE MALEATE 10 MG PO TABS: 10.0000 mg | ORAL_TABLET | Freq: Four times a day (QID) | ORAL | 0 refills | Status: DC | PRN

## 2023-12-25 MED ORDER — PANTOPRAZOLE SODIUM 20 MG PO TBEC: 20.0000 mg | DELAYED_RELEASE_TABLET | Freq: Every day | ORAL | 0 refills | Status: DC

## 2023-12-25 NOTE — Telephone Encounter (Addendum)
 The patient now has diarrhea rather than constipation. He said he stopped the med that was helping for constipation 2 days ok. He is having 2 a day for diarrhea, he gets nauseated and he knows that he has that medicine at his house and it is Zofran .  He says it is hard to get some nourishment in him.  Spoke to him about soft stuff like rice or mashed potatoes, soups.  He can get beef or chicken broth.  Get those drinks which is Ensure and boost has lots of protein in it which is good for you.  I did not know if he can take the Imodium because of how much constipation he has had in the past.  He just wants to know what can help him best.  I told him that he should take the Zofran  every 8 hours for nausea, that he just feels like his stomach is churning.  I have asked Dr.  if it is okay to get him some pantoprazole  to see if that could help with his stomach.  Dr. Rennie says that we can also send in another nausea medicine that is called Compazine .pantoprazole .I called and let patient know that  we are giving him Compazine  that he can take 4 times a day every 6 hours, then we are putting him on him  pantoprazole  once a day in the morning.  Sure you try to drink and you have to get some eating but make sure you get it like soft and we have already went over a whole list of that would be able to eat good for you and you do not have to cut it up and things like that soft things and we talked about different foods, liquids he is doing the Premier shake.  I told the patient that he should go get the meds and see if that helps you over the weekend.  He will get it today he said

## 2023-12-27 ENCOUNTER — Encounter: Payer: Self-pay | Admitting: Internal Medicine

## 2023-12-27 NOTE — Progress Notes (Signed)
 Patient called regarding ongoing diarrhea/nausea poor appetite.  Recommend starting Imodium.  If not improved call us  back later in the evening.   GB

## 2023-12-30 ENCOUNTER — Encounter: Payer: Self-pay | Admitting: Internal Medicine

## 2023-12-31 ENCOUNTER — Telehealth: Payer: Self-pay | Admitting: *Deleted

## 2023-12-31 ENCOUNTER — Ambulatory Visit
Admission: RE | Admit: 2023-12-31 | Discharge: 2023-12-31 | Disposition: A | Discharge status: Home / Self Care | Source: Ambulatory Visit | Attending: Hospice and Palliative Medicine | Admitting: Hospice and Palliative Medicine

## 2023-12-31 ENCOUNTER — Inpatient Hospital Stay: Attending: Internal Medicine | Admitting: Hospice and Palliative Medicine

## 2023-12-31 ENCOUNTER — Other Ambulatory Visit: Payer: Self-pay | Admitting: Hospice and Palliative Medicine

## 2023-12-31 DIAGNOSIS — C8333 Diffuse large B-cell lymphoma, intra-abdominal lymph nodes: Secondary | ICD-10-CM

## 2023-12-31 DIAGNOSIS — M25552 Pain in left hip: Secondary | ICD-10-CM | POA: Diagnosis not present

## 2023-12-31 DIAGNOSIS — Z5111 Encounter for antineoplastic chemotherapy: Secondary | ICD-10-CM | POA: Insufficient documentation

## 2023-12-31 DIAGNOSIS — C83398 Diffuse large b-cell lymphoma of other extranodal and solid organ sites: Secondary | ICD-10-CM | POA: Insufficient documentation

## 2023-12-31 DIAGNOSIS — K5900 Constipation, unspecified: Secondary | ICD-10-CM | POA: Insufficient documentation

## 2023-12-31 DIAGNOSIS — Z5112 Encounter for antineoplastic immunotherapy: Secondary | ICD-10-CM | POA: Insufficient documentation

## 2023-12-31 DIAGNOSIS — R197 Diarrhea, unspecified: Secondary | ICD-10-CM | POA: Insufficient documentation

## 2023-12-31 DIAGNOSIS — Z5189 Encounter for other specified aftercare: Secondary | ICD-10-CM | POA: Insufficient documentation

## 2023-12-31 MED ORDER — TRAMADOL HCL 50 MG PO TABS: 50.0000 mg | ORAL_TABLET | Freq: Four times a day (QID) | ORAL | 0 refills | Status: DC | PRN

## 2023-12-31 NOTE — Telephone Encounter (Signed)
 Spoke with Sidra, NP. Patient will need a dg xray-hip and femur 2 hours prior to his [facility] apt.  I contacted patient and made him aware of the plan. Orders entered. Pt will see Josh at 215.

## 2023-12-31 NOTE — Progress Notes (Signed)
 Virtual Visit via Telephone Note  I connected with Ruben Garcia on 12/31/23 at  2:15 PM EDT by telephone and verified that I am speaking with the correct person using two identifiers.  Location: Patient: Home Provider: Clinic   I discussed the limitations, risks, security and privacy concerns of performing an evaluation and management service by telephone and the availability of in person appointments. I also discussed with the patient that there may be a patient responsible charge related to this service. The patient expressed understanding and agreed to proceed.   History of Present Illness: Ruben Garcia is a 78 year old man with multiple medical problems including diffuse large B-cell lymphoma on treatment with Pola R-CHOP chemotherapy.   Observations/Objective: Patient was scheduled for SMT visit today in clinic to evaluate left hip/leg pain.  However, he did not show for clinic appointment.  I called and spoke with him instead.  Patient says that he has history of back pain (previously seen by interventional pain management) but more recently has had pain in the left hip and leg.  He denies falls or trauma.  No swelling or redness.  Patient says that he has taken oxycodone  with good effect but cannot stand the taste or to swallow the pills.  He asks if there are any other options for pain medications.  He says he cannot take Norco.  Patient was recently prescribed transdermal fentanyl  but says that he is not taking it.  Of note, recent PET scan did demonstrate hypermetabolic activity in the left hip and femur.  I did obtain x-rays of pelvis today which shows sclerotic activity but no pathologic fracture.  Assessment and Plan: DLBCL - on active treatment with Pola R-CHOP.  Has MD follow-up next week.  Neoplasm related pain -start tramadol  50 mg every 6 hours as needed.  Discussed importance of daily bowel regimen to prevent opioid-induced constipation.  Follow Up Instructions: MD  follow-up next week   I discussed the assessment and treatment plan with the patient. The patient was provided an opportunity to ask questions and all were answered. The patient agreed with the plan and demonstrated an understanding of the instructions.   The patient was advised to call back or seek an in-person evaluation if the symptoms worsen or if the condition fails to improve as anticipated.  I provided 15 minutes of non-face-to-face time during this encounter.   FONDA JONELLE MOWER, NP

## 2023-12-31 NOTE — Telephone Encounter (Signed)
 The patient called in saying that he is having deterioration in the left hip and thigh bone.  He wants to know what he can used for this pain or if you pain medicine to give to him

## 2024-01-01 ENCOUNTER — Encounter: Payer: Self-pay | Admitting: Internal Medicine

## 2024-01-07 ENCOUNTER — Encounter: Payer: Self-pay | Admitting: Internal Medicine

## 2024-01-07 ENCOUNTER — Inpatient Hospital Stay (HOSPITAL_BASED_OUTPATIENT_CLINIC_OR_DEPARTMENT_OTHER): Admitting: Internal Medicine

## 2024-01-07 ENCOUNTER — Inpatient Hospital Stay

## 2024-01-07 VITALS — BP 112/78 | HR 82 | Temp 97.4°F

## 2024-01-07 VITALS — BP 97/83 | HR 91 | Temp 97.5°F | Resp 18 | Ht 72.0 in | Wt 189.5 lb

## 2024-01-07 DIAGNOSIS — C8333 Diffuse large B-cell lymphoma, intra-abdominal lymph nodes: Secondary | ICD-10-CM

## 2024-01-07 DIAGNOSIS — C83398 Diffuse large b-cell lymphoma of other extranodal and solid organ sites: Secondary | ICD-10-CM | POA: Diagnosis present

## 2024-01-07 DIAGNOSIS — Z5112 Encounter for antineoplastic immunotherapy: Secondary | ICD-10-CM | POA: Diagnosis present

## 2024-01-07 DIAGNOSIS — R197 Diarrhea, unspecified: Secondary | ICD-10-CM | POA: Diagnosis not present

## 2024-01-07 DIAGNOSIS — K5900 Constipation, unspecified: Secondary | ICD-10-CM | POA: Diagnosis not present

## 2024-01-07 DIAGNOSIS — Z5111 Encounter for antineoplastic chemotherapy: Secondary | ICD-10-CM | POA: Diagnosis present

## 2024-01-07 DIAGNOSIS — Z5189 Encounter for other specified aftercare: Secondary | ICD-10-CM | POA: Diagnosis not present

## 2024-01-07 LAB — CMP (CANCER CENTER ONLY)
ALT: 31 U/L (ref 0–44)
AST: 29 U/L (ref 15–41)
Albumin: 3.3 g/dL — ABNORMAL LOW (ref 3.5–5.0)
Alkaline Phosphatase: 69 U/L (ref 38–126)
Anion gap: 9 (ref 5–15)
BUN: 5 mg/dL — ABNORMAL LOW (ref 8–23)
CO2: 24 mmol/L (ref 22–32)
Calcium: 8.1 mg/dL — ABNORMAL LOW (ref 8.9–10.3)
Chloride: 104 mmol/L (ref 98–111)
Creatinine: 1.02 mg/dL (ref 0.61–1.24)
GFR, Estimated: >60 mL/min (ref >60)
Glucose, Bld: 101 mg/dL — ABNORMAL HIGH (ref 70–99)
Potassium: 3.7 mmol/L (ref 3.5–5.1)
Sodium: 137 mmol/L (ref 135–145)
Total Bilirubin: 0.7 mg/dL (ref 0.0–1.2)
Total Protein: 6.5 g/dL (ref 6.5–8.1)

## 2024-01-07 LAB — CBC WITH DIFFERENTIAL (CANCER CENTER ONLY)
Abs Immature Granulocytes: 0.55 K/uL — ABNORMAL HIGH (ref 0.00–0.07)
Basophils Absolute: 0.1 K/uL (ref 0.0–0.1)
Basophils Relative: 2 %
Eosinophils Absolute: 0.1 K/uL (ref 0.0–0.5)
Eosinophils Relative: 1 %
HCT: 29.9 % — ABNORMAL LOW (ref 39.0–52.0)
Hemoglobin: 10.2 g/dL — ABNORMAL LOW (ref 13.0–17.0)
Immature Granulocytes: 7 %
Lymphocytes Relative: 21 %
Lymphs Abs: 1.6 K/uL (ref 0.7–4.0)
MCH: 33.1 pg (ref 26.0–34.0)
MCHC: 34.1 g/dL (ref 30.0–36.0)
MCV: 97.1 fL (ref 80.0–100.0)
Monocytes Absolute: 0.7 K/uL (ref 0.1–1.0)
Monocytes Relative: 9 %
Neutro Abs: 4.8 K/uL (ref 1.7–7.7)
Neutrophils Relative %: 60 %
Platelet Count: 304 K/uL (ref 150–400)
RBC: 3.08 MIL/uL — ABNORMAL LOW (ref 4.22–5.81)
RDW: 16.4 % — ABNORMAL HIGH (ref 11.5–15.5)
Smear Review: NORMAL
WBC Count: 7.9 K/uL (ref 4.0–10.5)
nRBC: 0 % (ref 0.0–0.2)

## 2024-01-07 LAB — LACTATE DEHYDROGENASE: LDH: 222 U/L — ABNORMAL HIGH (ref 98–192)

## 2024-01-07 MED ORDER — DEXAMETHASONE SODIUM PHOSPHATE 10 MG/ML IJ SOLN
10.0000 mg | Freq: Once | INTRAMUSCULAR | Status: AC
  Administered 2024-01-07: 10 mg via INTRAVENOUS
  Filled 2024-01-07: qty 1

## 2024-01-07 MED ORDER — ACETAMINOPHEN 325 MG PO TABS
650.0000 mg | ORAL_TABLET | Freq: Once | ORAL | Status: AC
  Administered 2024-01-07: 650 mg via ORAL
  Filled 2024-01-07: qty 2

## 2024-01-07 MED ORDER — DIPHENHYDRAMINE HCL 25 MG PO CAPS
50.0000 mg | ORAL_CAPSULE | Freq: Once | ORAL | Status: AC
  Administered 2024-01-07: 50 mg via ORAL
  Filled 2024-01-07: qty 2

## 2024-01-07 MED ORDER — SODIUM CHLORIDE 0.9 % IV SOLN
1.8000 mg/kg | Freq: Once | INTRAVENOUS | Status: AC
  Administered 2024-01-07: 170 mg via INTRAVENOUS
  Filled 2024-01-07: qty 7

## 2024-01-07 MED ORDER — ALTEPLASE 2 MG IJ SOLR
2.0000 mg | Freq: Once | INTRAMUSCULAR | Status: AC | PRN
  Administered 2024-01-07: 2 mg
  Filled 2024-01-07: qty 2

## 2024-01-07 MED ORDER — PALONOSETRON HCL INJECTION 0.25 MG/5ML
0.2500 mg | Freq: Once | INTRAVENOUS | Status: AC
  Administered 2024-01-07: 0.25 mg via INTRAVENOUS
  Filled 2024-01-07: qty 5

## 2024-01-07 MED ORDER — APREPITANT 130 MG/18ML IV EMUL
130.0000 mg | Freq: Once | INTRAVENOUS | Status: AC
  Administered 2024-01-07: 130 mg via INTRAVENOUS
  Filled 2024-01-07: qty 18

## 2024-01-07 MED ORDER — SODIUM CHLORIDE 0.9 % IV SOLN
INTRAVENOUS | Status: DC
  Filled 2024-01-07 (×2): qty 250

## 2024-01-07 MED ORDER — SODIUM CHLORIDE 0.9 % IV SOLN
375.0000 mg/m2 | Freq: Once | INTRAVENOUS | Status: AC
  Administered 2024-01-07: 800 mg via INTRAVENOUS
  Filled 2024-01-07: qty 30

## 2024-01-07 MED ORDER — DOXORUBICIN HCL CHEMO IV INJECTION 2 MG/ML
50.0000 mg/m2 | Freq: Once | INTRAVENOUS | Status: AC
  Administered 2024-01-07: 106 mg via INTRAVENOUS
  Filled 2024-01-07: qty 53

## 2024-01-07 MED ORDER — FAMOTIDINE IN NACL 20-0.9 MG/50ML-% IV SOLN
20.0000 mg | Freq: Once | INTRAVENOUS | Status: AC
  Administered 2024-01-07: 20 mg via INTRAVENOUS
  Filled 2024-01-07: qty 50

## 2024-01-07 MED ORDER — MONTELUKAST SODIUM 10 MG PO TABS
10.0000 mg | ORAL_TABLET | Freq: Once | ORAL | Status: AC
  Administered 2024-01-07: 10 mg via ORAL
  Filled 2024-01-07: qty 1

## 2024-01-07 MED ORDER — SODIUM CHLORIDE 0.9 % IV SOLN
750.0000 mg/m2 | Freq: Once | INTRAVENOUS | Status: AC
  Administered 2024-01-07: 1500 mg via INTRAVENOUS
  Filled 2024-01-07: qty 75

## 2024-01-07 NOTE — Progress Notes (Signed)
 Nutrition Follow-up:  Patient with diffuse large B-cell lymphoma.  Receiving POLA-R-CHP.    Met with patient and wife during infusion.  Patient reports for the first 2 weeks after treatment had bad issues with constipation, nausea, metallic/bitter taste in mouth, then issues with diarrhea.  Appetite improved after the first few weeks.  Mostly ate soups, shakes.  Feels like shakes contribute to constipation.      Medications: lactulose   Labs: reviewed  Anthropometrics:   Weight 189 lb 8 oz today  189 lb 14.4 oz on 8/21 190 lb 14.7 oz on 8/14 219 lb 9.6 oz on 10/08/23   NUTRITION DIAGNOSIS: Inadequate oral intake continues but stable weight   INTERVENTION:  Provided information regarding Metaqil for taste.  Patient and wife will review  Continue bowel regimen Encouraged nausea medication to help ease symptoms    MONITORING, EVALUATION, GOAL: weight trends, intake   NEXT VISIT: Thursday, Oct 2nd during infusion  Malaki Koury B. Dasie SOLON, CSO, LDN Registered Dietitian [phone]

## 2024-01-07 NOTE — Progress Notes (Signed)
 12/31/23 xray pelvis and femur.

## 2024-01-07 NOTE — Progress Notes (Signed)
 [facility] CONSULT NOTE  Patient Care Team: Myrla Jon HERO, MD as PCP - General (Family Medicine) Verdene Gills, RN as Oncology Nurse Navigator [name], MD as Consulting Physician (Oncology)  CHIEF COMPLAINTS/PURPOSE OF CONSULTATION: Lung mass/pelvic masses concerning for cancer  Oncology History Overview Note    #  STAGE IV- JULY 2025- CT chest- Right hilar mass/mediastinal adenopathy.  MRI lumbar spine shows-L2 lesion.  MRI of the pelvis- Substantial metastatic burden to the bilateral iliac bones, right ischium, bilateral posterior acetabular walls, left pubic bone, central and right sacrum, bilateral femoral neck, and bilateral intertrochanteric region. In particular, tumor in the proximal femurs  Extraosseous extension of tumor from the dominant left iliac lesion, tracking along the left iliopsoas and upper pelvic sidewall; posteriorly along the gluteus minimus and gluteus medius, and even posteriorly along the lower paraspinal and medial left gluteus maximus musculature; Extraosseous extension of tumor from the right iliac lesion, tracking along the right iliopsoas and right gluteus minimus. Tumor along the attachment site of the right rectus femoris muscle, which could predispose to avulsion. Scattered small pelvic lymph nodes; Mildly prominent median lobe of prostate gland indents the bladder base.  12/07/2023- PET scan-   Hypermetabolic right hilar and subcarinal lymphadenopathy consistent with lymphoma. Hypermetabolic disease involving the left iliac bone and adjacent iliopsoas and gluteus musculature; Hypermetabolic disease involving the bony anatomy of the pelvis and proximal femurs bilaterally, proximal left femoral diaphysis, left-sided ribs, lumbar spine, and sacral spine as well as parasacral soft tissues.  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   KI-67 IS ESSENTIALLY 100%. TO EXCLUDE A SO-CALLED HIGH-GRADE B-CELL  LYMPHOMA/DOUBLE HIT LYMPHOMA FISH IS PENDING.   MUGA SCAN- 8/19-  hepatitis panel_NEG.     # AUG 14th, 2025-  rituximab  today-pending MUGA scan.   # AUG 21st, 2025- cycle #1 of chemotherapy POLA R-CHP    # Elevated AST ALT-question etiology.   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   DLBCL (diffuse large B cell lymphoma) (HCC)  12/01/2023 Initial Diagnosis   DLBCL (diffuse large B cell lymphoma) (HCC)   12/01/2023 Cancer Staging   Staging form: Hodgkin and Non-Hodgkin Lymphoma, AJCC 8th Edition - Clinical: Stage IV (Diffuse large B-cell lymphoma) - Signed by Genevive Printup R, MD on 12/01/2023   12/10/2023 -  Chemotherapy   Patient is on Treatment Plan : NON-HODGKINS LYMPHOMA POLA-R-CHP D1 X 6 cycles / Rituximab  D1 q21d X 2 cycles       HISTORY OF PRESENTING ILLNESS: Patient ambulating-in with a cane. accompanied by wife.   Ruben Garcia 78 y.o.  male pleasant patient with a diffuse large B-cell lymphoma stage IV is here to proceed with on Pola- RHP is here for a follow up.   Patient had constipation followed by diarrhea. Currently, improved on stool softer.   Patient taking intermittent Percocet.  Overall pain significantly improved.  Review of Systems  Constitutional:  Positive for malaise/fatigue and weight loss. Negative for chills, diaphoresis and fever.  HENT:  Negative for nosebleeds and sore throat.   Eyes:  Negative for double vision.  Respiratory:  Negative for hemoptysis, sputum production, shortness of breath and wheezing.   Cardiovascular:  Negative for chest  pain, palpitations, orthopnea and leg swelling.  Gastrointestinal:  Negative for abdominal pain, blood in stool, constipation, diarrhea, heartburn, melena, nausea and  vomiting.  Genitourinary:  Negative for dysuria, frequency and urgency.  Musculoskeletal:  Positive for back pain, joint pain and myalgias.  Skin: Negative.  Negative for itching and rash.  Neurological:  Negative for dizziness, tingling, focal weakness, weakness and headaches.  Endo/Heme/Allergies:  Does not bruise/bleed easily.  Psychiatric/Behavioral:  Negative for depression. The patient is not nervous/anxious and does not have insomnia.     MEDICAL HISTORY:  Past Medical History:  Diagnosis Date   Allergic rhinitis, cause unspecified    Basal cell carcinoma of skin, site unspecified 04/28/2010   Nasal; [place].   BPH (benign prostatic hypertrophy)    Colon polyps    Essential hypertension, benign    Hemorrhoids, internal    Incomplete bladder emptying    Other abnormal glucose    Over weight    Personal history of colonic polyps    Personal history of other genital system and obstetric disorders(V13.29)    Pure hypercholesterolemia    Unspecified disorder of kidney and ureter    Unspecified disorder of liver     SURGICAL HISTORY: Past Surgical History:  Procedure Laterality Date   BASAL CELL CARCINOMA EXCISION  2012   Facial        [place]   COLONOSCOPY  11/13/2010   single polyp; repeatin 5 years; [place]   COLONOSCOPY N/A 11/04/2021   Procedure: COLONOSCOPY;  Surgeon: Maryruth Ole DASEN, MD;  Location: [facility] ENDOSCOPY;  Service: Endoscopy;  Laterality: N/A;  REQUEST EARLY TIME   COLONOSCOPY WITH PROPOFOL  N/A 12/13/2015   Procedure: COLONOSCOPY WITH PROPOFOL ;  Surgeon: Lamar DASEN Viktoria, MD;  Location: [facility] ENDOSCOPY;  Service: Endoscopy;  Laterality: N/A;   ear surgery  05/2012   [name].  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  Benign.   HEMORRHOID SURGERY  1978   [name]   IR IMAGING GUIDED PORT INSERTION  12/04/2023   LAPAROSCOPIC CHOLECYSTECTOMY  1982   rotator cuff surgery Right 05/14/2016   VASECTOMY  1978    SOCIAL HISTORY: Social History   Socioeconomic History   Marital  status: Married    Spouse name: Heron   Number of children: 2   Years of education: 2 years college   Highest education level: Not on file  Occupational History   Occupation: retired    Comment: postal service in 2005  Tobacco Use   Smoking status: Never   Smokeless tobacco: Never  Vaping Use   Vaping status: Never Used  Substance and Sexual Activity   Alcohol use: Not Currently    Alcohol/week: 5.0 standard drinks of alcohol    Types: 5 Cans of beer per week    Comment: weekends beer, 4- 6 total Cleotilde Mallory   Drug use: No   Sexual activity: Not Currently    Partners: Female  Other Topics Concern   Not on file  Social History Narrative   Always uses seat belts. Smoke alarm and carbon monoxide detector in the home. Guns in the home stored in locked cabinet.Caffeine use: Coffee 2 servings per day, moderate amount. Exercise: Moderate walking 2 - 4 miles daily, 2 - 3 days per week.    Marital status:  Married x 47 years, happily.      Children:  2 children; 1 grandchild (20).      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      Tobacco: never  Alcohol:  Weekends; beer 4-6 per weekend.      Exercise:  Walking every other day 2.5 miles three times per day.      Seatbelt: 100%; no texting      Guns: secured guns.      Living Will: completed in 2014.  FULL CODE but no prolonged measures.        ADLs: independent with ADLs; no assistant devices   Social Drivers of Health   Financial Resource Strain: Not on file  Food Insecurity: No Food Insecurity (11/27/2023)   Hunger Vital Sign    Worried About Running Out of Food in the Last Year: Never true    Ran Out of Food in the Last Year: Never true  Transportation Needs: No Transportation Needs (11/27/2023)   PRAPARE - Administrator, Civil Service (Medical): No    Lack of Transportation (Non-Medical): No  Physical Activity: Not on file  Stress: Not on file  Social Connections: Unknown (11/19/2023)    Social Connection and Isolation Panel    Frequency of Communication with Friends and Family: Three times a week    Frequency of Social Gatherings with Friends and Family: Three times a week    Attends Religious Services: Patient declined    Active Member of Clubs or Organizations: Patient declined    Attends Banker Meetings: Patient declined    Marital Status: Married  Catering manager Violence: Not At Risk (11/27/2023)   Humiliation, Afraid, Rape, and Kick questionnaire    Fear of Current or Ex-Partner: No    Emotionally Abused: No    Physically Abused: No    Sexually Abused: No    FAMILY HISTORY: Family History  Problem Relation Age of Onset   Heart disease Mother        first MI in 102s   Diabetes Mother    Hyperlipidemia Mother    Arthritis Mother        rheumatoid   Cancer Father        lung   Diabetes Father    Arthritis Sister    Hyperlipidemia Sister    Hyperlipidemia Sister    Hypertension Sister    Hyperlipidemia Sister    Kidney disease Neg Hx    Prostate cancer Neg Hx    Colon cancer Neg Hx     ALLERGIES:  is allergic to codeine.  MEDICATIONS:  Current Outpatient Medications  Medication Sig Dispense Refill   acetaminophen  (TYLENOL ) 325 MG tablet Take 2 tablets (650 mg total) by mouth every 6 (six) hours as needed for mild pain (pain score 1-3), fever, headache or moderate pain (pain score 4-6) (or Fever >/= 101).     acyclovir  (ZOVIRAX ) 400 MG tablet Take 1 tablet (400 mg total) by mouth 2 (two) times daily. [to prevent shingles] 60 tablet 3   albuterol  (VENTOLIN  HFA) 108 (90 Base) MCG/ACT inhaler Inhale 1-2 puffs into the lungs every 6 (six) hours as needed. 18 g 0   allopurinol  (ZYLOPRIM ) 300 MG tablet Take 1 tablet (300 mg total) by mouth 2 (two) times daily. 120 tablet 0   cyclobenzaprine  (FLEXERIL ) 10 MG tablet Take 1 tablet (10 mg total) by mouth 3 (three) times daily as needed for muscle spasms. 30 tablet 2   lactulose  (CHRONULAC ) 10  GM/15ML solution Take 15 mLs (10 g total) by mouth daily as needed for mild constipation. 236 mL 0   lidocaine -prilocaine  (EMLA ) cream Apply 1 Application topically as needed. Apply to port and cover  with saran wrap 1-2 hours prior to port access 30 g 1   ondansetron  (ZOFRAN ) 4 MG tablet Take 1 tablet (4 mg total) by mouth every 8 (eight) hours as needed for nausea or vomiting. 30 tablet 0   pantoprazole  (PROTONIX ) 20 MG tablet Take 1 tablet (20 mg total) by mouth daily. 30 tablet 0   predniSONE  (DELTASONE ) 50 MG tablet Take 2 tablets once day x 5 days. START on day of your chemotherapy. Take in AM; with FOOD. 10 tablet 5   prochlorperazine  (COMPAZINE ) 10 MG tablet Take 1 tablet (10 mg total) by mouth every 6 (six) hours as needed for nausea or vomiting. 30 tablet 0   traMADol  (ULTRAM ) 50 MG tablet Take 1 tablet (50 mg total) by mouth every 6 (six) hours as needed. 30 tablet 0   No current facility-administered medications for this visit.   Facility-Administered Medications Ordered in Other Visits  Medication Dose Route Frequency Provider Last Rate Last Admin   0.9 %  sodium chloride  infusion   Intravenous Continuous Rennie, Trystyn Dolley R, MD       acetaminophen  (TYLENOL ) tablet 650 mg  650 mg Oral Once Donis Pinder R, MD       aprepitant  (CINVANTI ) injection 130 mg  130 mg Intravenous Once Tawanna Funk R, MD       cyclophosphamide  (CYTOXAN ) 1,500 mg in sodium chloride  0.9 % 250 mL chemo infusion  750 mg/m2 (Treatment Plan Recorded) Intravenous Once Fenix Ruppe R, MD       dexamethasone  (DECADRON ) injection 10 mg  10 mg Intravenous Once Fedor Kazmierski R, MD       diphenhydrAMINE  (BENADRYL ) capsule 50 mg  50 mg Oral Once Eddy Termine R, MD       DOXOrubicin  (ADRIAMYCIN ) chemo injection 106 mg  50 mg/m2 (Treatment Plan Recorded) Intravenous Once Patrick Sohm R, MD       famotidine  (PEPCID ) IVPB 20 mg premix  20 mg Intravenous Once Lukas Pelcher R, MD        montelukast  (SINGULAIR ) tablet 10 mg  10 mg Oral Once Jodean Valade R, MD       palonosetron  (ALOXI ) injection 0.25 mg  0.25 mg Intravenous Once Sharmila Wrobleski R, MD       polatuzumab vedotin -piiq (POLIVY ) 170 mg in sodium chloride  0.9 % 100 mL (1.5668 mg/mL) chemo infusion  1.8 mg/kg (Treatment Plan Recorded) Intravenous Once Marria Mathison R, MD       riTUXimab -pvvr (RUXIENCE ) 800 mg in sodium chloride  0.9 % 170 mL infusion  375 mg/m2 (Treatment Plan Recorded) Intravenous Once Akasia Ahmad R, MD        PHYSICAL EXAMINATION:   Vitals:   01/07/24 0805  BP: 97/83  Pulse: 91  Resp: 18  Temp: (!) 97.5 F (36.4 C)  SpO2: 96%   Filed Weights   01/07/24 0805  Weight: 189 lb 8 oz (86 kg)   Patient is wheezing bilaterally.  Physical Exam Vitals and nursing note reviewed.  HENT:     Head: Normocephalic and atraumatic.     Mouth/Throat:     Pharynx: Oropharynx is clear.  Eyes:     Extraocular Movements: Extraocular movements intact.     Pupils: Pupils are equal, round, and reactive to light.  Cardiovascular:     Rate and Rhythm: Normal rate and regular rhythm.  Pulmonary:     Comments: Decreased breath sounds bilaterally.  Abdominal:     Palpations: Abdomen is soft.  Musculoskeletal:  General: Normal range of motion.     Cervical back: Normal range of motion.  Skin:    General: Skin is warm.  Neurological:     General: No focal deficit present.     Mental Status: He is alert and oriented to person, place, and time.  Psychiatric:        Behavior: Behavior normal.        Judgment: Judgment normal.     LABORATORY DATA:  I have reviewed the data as listed Lab Results  Component Value Date   WBC 7.9 01/07/2024   HGB 10.2 (L) 01/07/2024   HCT 29.9 (L) 01/07/2024   MCV 97.1 01/07/2024   PLT 304 01/07/2024   Recent Labs    03/31/23 0930 10/08/23 0900 11/19/23 1347 11/20/23 0511 12/10/23 0816 12/17/23 0819 01/07/24 0805  NA 143   <  >  --    < > 133* 135 137  K 4.9   < >  --    < > 4.1 4.1 3.7  CL 103   < >  --    < > 101 100 104  CO2 26   < >  --    < > 24 26 24   GLUCOSE 103*   < >  --    < > 87 87 101*  BUN 11   < >  --    < > 26* 22 5*  CREATININE 1.21   < >  --    < > 1.01 1.08 1.02  CALCIUM  10.0   < >  --    < > 8.9 9.1 8.1*  GFRNONAA  --    < >  --    < > >60 >60 >60  PROT 7.4   < >  --    < > 6.8 6.8 6.5  ALBUMIN 4.7   < >  --    < > 3.4* 3.4* 3.3*  AST 34   < >  --    < > 43* 33 29  ALT 38   < >  --    < > 99* 54* 31  ALKPHOS 52   < >  --    < > 141* 109 69  BILITOT 1.7*  1.9*   < > 2.5*   < > 1.4* 1.6* 0.7  BILIDIR 0.40  --  0.3*  --   --   --   --   IBILI 1.30*  --  2.2*  --   --   --   --    < > = values in this interval not displayed.    RADIOGRAPHIC STUDIES: I have personally reviewed the radiological images as listed and agreed with the findings in the report. DG FEMUR MIN 2 VIEWS LEFT Result Date: 12/31/2023 CLINICAL DATA:  History of diffuse large B-cell lymphoma with left hip pain EXAM: LEFT FEMUR 2 VIEWS; PELVIS - 1 VIEW COMPARISON:  Nuclear medicine PET dated 12/07/2023 FINDINGS: There is no evidence of fracture. Heterogeneously sclerotic appearance of the left pelvis and subtle sclerosis of the left intertrochanteric femur. Soft tissues are unremarkable. Surgical clips project over the left hemipelvis. IMPRESSION: Heterogeneously sclerotic appearance of the left pelvis and subtle sclerosis of the left intertrochanteric femur, likely related to known lymphomatous involvement. No pathologic fracture. Electronically Signed   By: Limin  Xu M.D.   On: 12/31/2023 13:31   DG Pelvis 1-2 Views Result Date: 12/31/2023 CLINICAL DATA:  History of diffuse large B-cell lymphoma with left  hip pain EXAM: LEFT FEMUR 2 VIEWS; PELVIS - 1 VIEW COMPARISON:  Nuclear medicine PET dated 12/07/2023 FINDINGS: There is no evidence of fracture. Heterogeneously sclerotic appearance of the left pelvis and subtle sclerosis of the  left intertrochanteric femur. Soft tissues are unremarkable. Surgical clips project over the left hemipelvis. IMPRESSION: Heterogeneously sclerotic appearance of the left pelvis and subtle sclerosis of the left intertrochanteric femur, likely related to known lymphomatous involvement. No pathologic fracture. Electronically Signed   By: Limin  Xu M.D.   On: 12/31/2023 13:31   NM Cardiac Muga Rest Result Date: 12/15/2023 CLINICAL DATA:  Large B-cell lymphoma.  Assess cardiac function EXAM: NUCLEAR MEDICINE CARDIAC BLOOD POOL IMAGING (MUGA) TECHNIQUE: Cardiac multi-gated acquisition was performed at rest following intravenous injection of Tc-39m labeled red blood cells. RADIOPHARMACEUTICALS:  21.5 mCi Tc-54m pertechnetate in-vitro labeled red blood cells IV COMPARISON:  None Available. FINDINGS: No  focal wall motion abnormality of the left ventricle. Calculated left ventricular ejection fraction equals 53.5 % IMPRESSION: Left ventricular ejection fraction equals53.5 %. Electronically Signed   By: Jackquline Boxer M.D.   On: 12/15/2023 13:45     DLBCL (diffuse large B cell lymphoma) (HCC)   #  STAGE IV- JULY 2025- CT chest- Right hilar mass/mediastinal adenopathy.  MRI lumbar spine shows-L2 lesion.  MRI of the pelvis- Substantial metastatic burden to the bilateral iliac bones, right ischium, bilateral posterior acetabular walls, left pubic bone, central and right sacrum, bilateral femoral neck, and bilateral intertrochanteric region. In particular, tumor in the proximal femurs  Extraosseous extension of tumor from the dominant left iliac lesion, tracking along the left iliopsoas and upper pelvic sidewall; posteriorly along the gluteus minimus and gluteus medius, and even posteriorly along the lower paraspinal and medial left gluteus maximus musculature; Extraosseous extension of tumor from the right iliac lesion, tracking along the right iliopsoas and right gluteus minimus. Tumor along the attachment site of  the right rectus femoris muscle, which could predispose to avulsion. Scattered small pelvic lymph nodes; Mildly prominent median lobe of prostate gland indents the bladder base.  12/07/2023- PET scan-   Hypermetabolic right hilar and subcarinal lymphadenopathy consistent with lymphoma. Hypermetabolic disease involving the left iliac bone and adjacent iliopsoas and gluteus musculature; Hypermetabolic disease involving the bony anatomy of the pelvis and proximal femurs bilaterally, proximal left femoral diaphysis, left-sided ribs, lumbar spine, and sacral spine as well as parasacral soft tissues.   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   KI-67 IS ESSENTIALLY 100%. TO EXCLUDE A SO-CALLED HIGH-GRADE B-CELL  LYMPHOMA/DOUBLE HIT LYMPHOMA FISH IS PENDING-reached out to path.   hepatitis panel NEG. AUG 19th, 2025-  MUGA scan- left ventricular ejection fraction equals 53.5 %. 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  Labs-CBC/chemistries were reviewed with the patient. I would do interim scan after 2 cycles of chemo.  Ordered today- if costs issues will get CT scan.   # Constipation followed by diarrhea- sec to chemo- continue prn stool softeners-   # ID prophylaxis-recommend acyclovir  400 twice daily; Bactrim  Monday Wednesday Friday.  Tumor lysis prophylaxis-allopurinol  300 mg a day   # Lower back pain-secondary to presumed  malignancy involving the L2/sacrum--again discussed importance of walking with support given involvement of the femur. Stable. continue Percocet prn. No fractures- noted.   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    # IV access: s/p Mediport placement.   PS-  # DISPOSITION: # chemo today-; d-2 injection #  follow in 3 weeks- MD; labs- cbc/cmp; LDH; chemo- d-2 injection- PET scan-  Dr.B   Above plan of care was discussed with patient/family in detail.  My contact information was given to the patient/family.     Cindy JONELLE Joe, MD 01/07/2024 9:21 AM

## 2024-01-07 NOTE — Patient Instructions (Signed)

## 2024-01-07 NOTE — Assessment & Plan Note (Signed)
#    STAGE IV- JULY 2025- CT chest- Right hilar mass/mediastinal adenopathy.  MRI lumbar spine shows-L2 lesion.  MRI of the pelvis- Substantial metastatic burden to the bilateral iliac bones, right ischium, bilateral posterior acetabular walls, left pubic bone, central and right sacrum, bilateral femoral neck, and bilateral intertrochanteric region. In particular, tumor in the proximal femurs  Extraosseous extension of tumor from the dominant left iliac lesion, tracking along the left iliopsoas and upper pelvic sidewall; posteriorly along the gluteus minimus and gluteus medius, and even posteriorly along the lower paraspinal and medial left gluteus maximus musculature; Extraosseous extension of tumor from the right iliac lesion, tracking along the right iliopsoas and right gluteus minimus. Tumor along the attachment site of the right rectus femoris muscle, which could predispose to avulsion. Scattered small pelvic lymph nodes; Mildly prominent median lobe of prostate gland indents the bladder base.  12/07/2023- PET scan-   Hypermetabolic right hilar and subcarinal lymphadenopathy consistent with lymphoma. Hypermetabolic disease involving the left iliac bone and adjacent iliopsoas and gluteus musculature; Hypermetabolic disease involving the bony anatomy of the pelvis and proximal femurs bilaterally, proximal left femoral diaphysis, left-sided ribs, lumbar spine, and sacral spine as well as parasacral soft tissues.   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   KI-67 IS ESSENTIALLY 100%. TO EXCLUDE A SO-CALLED HIGH-GRADE B-CELL  LYMPHOMA/DOUBLE HIT LYMPHOMA FISH IS PENDING-reached out to path.   hepatitis panel NEG. AUG 19th, 2025-  MUGA scan- left ventricular ejection fraction equals 53.5 %.  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  Labs-CBC/chemistries were reviewed with the patient. I would do interim scan after 2 cycles of chemo.  Ordered today- if costs issues will get CT scan.   # Constipation followed by diarrhea- sec to chemo- continue prn stool softeners-   # ID prophylaxis-recommend acyclovir  400 twice daily; Bactrim  Monday Wednesday Friday.  Tumor lysis prophylaxis-allopurinol  300 mg a day   # Lower back pain-secondary to presumed malignancy involving the L2/sacrum--again discussed importance of walking with support given involvement of the femur. Stable. continue Percocet prn. No fractures- noted.   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    # IV access: s/p Mediport placement.   PS-  # DISPOSITION: # chemo today-; d-2 injection #  follow in 3 weeks- MD; labs- cbc/cmp; LDH; chemo- d-2 injection- PET scan-  Dr.B

## 2024-01-08 ENCOUNTER — Inpatient Hospital Stay

## 2024-01-08 DIAGNOSIS — C8333 Diffuse large B-cell lymphoma, intra-abdominal lymph nodes: Secondary | ICD-10-CM

## 2024-01-08 DIAGNOSIS — Z5112 Encounter for antineoplastic immunotherapy: Secondary | ICD-10-CM | POA: Diagnosis not present

## 2024-01-08 MED ORDER — PEGFILGRASTIM-JMDB 6 MG/0.6ML ~~LOC~~ SOSY
6.0000 mg | PREFILLED_SYRINGE | Freq: Once | SUBCUTANEOUS | Status: AC
  Administered 2024-01-08: 6 mg via SUBCUTANEOUS
  Filled 2024-01-08: qty 0.6

## 2024-01-12 NOTE — Addendum Note (Signed)
 Encounter addended by: Janice Lynwood BROCKS on: 01/12/2024 10:35 AM  Actions taken: Imaging Exam ended

## 2024-01-18 ENCOUNTER — Telehealth: Payer: Self-pay | Admitting: *Deleted

## 2024-01-18 NOTE — Telephone Encounter (Signed)
 He would like to have Dr. To prescribe something that is going to help with his hacking coughing

## 2024-01-20 ENCOUNTER — Ambulatory Visit
Admission: RE | Admit: 2024-01-20 | Discharge: 2024-01-20 | Disposition: A | Discharge status: Home / Self Care | Source: Ambulatory Visit | Attending: Internal Medicine | Admitting: Internal Medicine

## 2024-01-20 DIAGNOSIS — J9809 Other diseases of bronchus, not elsewhere classified: Secondary | ICD-10-CM | POA: Diagnosis not present

## 2024-01-20 DIAGNOSIS — N289 Disorder of kidney and ureter, unspecified: Secondary | ICD-10-CM | POA: Insufficient documentation

## 2024-01-20 DIAGNOSIS — I700 Atherosclerosis of aorta: Secondary | ICD-10-CM | POA: Diagnosis not present

## 2024-01-20 DIAGNOSIS — C8333 Diffuse large B-cell lymphoma, intra-abdominal lymph nodes: Secondary | ICD-10-CM | POA: Insufficient documentation

## 2024-01-20 DIAGNOSIS — I2510 Atherosclerotic heart disease of native coronary artery without angina pectoris: Secondary | ICD-10-CM | POA: Diagnosis not present

## 2024-01-20 LAB — GLUCOSE, CAPILLARY: Glucose-Capillary: 129 mg/dL — ABNORMAL HIGH (ref 70–99)

## 2024-01-20 MED ORDER — FLUDEOXYGLUCOSE F - 18 (FDG) INJECTION
9.8000 | Freq: Once | INTRAVENOUS | Status: AC | PRN
Start: 2024-01-20 — End: 2024-01-20
  Administered 2024-01-20: 9.9 via INTRAVENOUS

## 2024-01-21 NOTE — Progress Notes (Signed)
 Spoke to patient regarding results of the PET scan-significant response noted/very encouraging results.  Keep appointments as planned-continue chemotherapy as planned.  Will follow-up next week GB

## 2024-01-27 ENCOUNTER — Other Ambulatory Visit: Payer: Self-pay | Admitting: *Deleted

## 2024-01-27 DIAGNOSIS — C8333 Diffuse large B-cell lymphoma, intra-abdominal lymph nodes: Secondary | ICD-10-CM

## 2024-01-27 NOTE — Assessment & Plan Note (Signed)
 #  STAGE IV- JULY 2025- CT chest- Right hilar mass/mediastinal adenopathy.  MRI lumbar spine shows-L2 lesion.  MRI of the pelvis- Substantial metastatic burden to the bilateral iliac bones, right ischium, bilateral posterior acetabular walls, left pubic bone, central and right sacrum, bilateral femoral neck, and bilateral intertrochanteric region. In particular, tumor in the proximal femurs  Extraosseous extension of tumor from the dominant left iliac lesion, tracking along the left iliopsoas and upper pelvic sidewall; posteriorly along the gluteus minimus and gluteus medius, and even posteriorly along the lower paraspinal and medial left gluteus maximus musculature; Extraosseous extension of tumor from the right iliac lesion, tracking along the right iliopsoas and right gluteus minimus. Tumor along the attachment site of the right rectus femoris muscle, which could predispose to avulsion. Scattered small pelvic lymph nodes; Mildly prominent median lobe of prostate gland indents the bladder base.  12/07/2023- PET scan-   Hypermetabolic right hilar and subcarinal lymphadenopathy consistent with lymphoma. Hypermetabolic disease involving the left iliac bone and adjacent iliopsoas and gluteus musculature; Hypermetabolic disease involving the bony anatomy of the pelvis and proximal femurs bilaterally, proximal left femoral diaphysis, left-sided ribs, lumbar spine, and sacral spine as well as parasacral soft tissues.   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   KI-67 IS ESSENTIALLY 100%. 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   hepatitis panel  NEG. AUG 19th, 2025-  MUGA scan- left ventricular ejection fraction equals 53.5 %. 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 Marked, near complete response to therapy of lymphoma. The only residual active disease is centered about the proximal right main stem bronchus, as evidenced by soft tissue thickening and hypermetabolism. dauville score 4.   # proceed with cycle # 2  POLA R-CHP chemotherapy every 3 weeks x 6 cycles.  Labs-CBC/chemistries were reviewed with the patient. I would do interim scan after 2 cycles of chemo.  Ordered today- if costs issues will get CT scan.   # Constipation followed by diarrhea- sec to chemo- continue prn stool softeners-   # ID prophylaxis-recommend acyclovir  400 twice daily; Bactrim  Monday Wednesday Friday.  Tumor lysis prophylaxis-allopurinol  300 mg a day   # Lower back pain-secondary to presumed malignancy involving the L2/sacrum--again discussed importance of walking with support given involvement of the femur. Stable. continue Percocet prn. No fractures- noted.   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   #Incidental findings on Imaging  CT , 2025:  Similar size of a 1.5 cm left renal lesion which measures greater than fluiddensity. Complex cyst is favored over a solid neoplasm, given lack of significant activity. This could either be reevaluated on follow up staging cts / pets or further evaluated with pre and post-contrast abdominal MRI I reviewed/discussed/counseled the patient.      # IV access: s/p Mediport placement.   PS-  # DISPOSITION: # chemo today-; d-2 injection #  follow in 3 weeks- MD; labs- cbc/cmp; LDH; chemo- d-2 injection-  PET scan-  Dr.B

## 2024-01-28 ENCOUNTER — Encounter: Payer: Self-pay | Admitting: Internal Medicine

## 2024-01-28 ENCOUNTER — Inpatient Hospital Stay

## 2024-01-28 ENCOUNTER — Inpatient Hospital Stay: Attending: Internal Medicine

## 2024-01-28 ENCOUNTER — Inpatient Hospital Stay: Admitting: Internal Medicine

## 2024-01-28 VITALS — BP 114/76 | HR 88 | Temp 96.2°F | Resp 18

## 2024-01-28 VITALS — BP 128/78 | HR 112 | Temp 98.4°F | Resp 16 | Ht 72.0 in | Wt 188.1 lb

## 2024-01-28 DIAGNOSIS — N289 Disorder of kidney and ureter, unspecified: Secondary | ICD-10-CM | POA: Insufficient documentation

## 2024-01-28 DIAGNOSIS — R197 Diarrhea, unspecified: Secondary | ICD-10-CM | POA: Diagnosis not present

## 2024-01-28 DIAGNOSIS — Z5189 Encounter for other specified aftercare: Secondary | ICD-10-CM | POA: Insufficient documentation

## 2024-01-28 DIAGNOSIS — Z5111 Encounter for antineoplastic chemotherapy: Secondary | ICD-10-CM | POA: Insufficient documentation

## 2024-01-28 DIAGNOSIS — M545 Low back pain, unspecified: Secondary | ICD-10-CM | POA: Insufficient documentation

## 2024-01-28 DIAGNOSIS — E876 Hypokalemia: Secondary | ICD-10-CM | POA: Diagnosis not present

## 2024-01-28 DIAGNOSIS — Z5112 Encounter for antineoplastic immunotherapy: Secondary | ICD-10-CM | POA: Diagnosis present

## 2024-01-28 DIAGNOSIS — C8333 Diffuse large B-cell lymphoma, intra-abdominal lymph nodes: Secondary | ICD-10-CM

## 2024-01-28 DIAGNOSIS — C83398 Diffuse large b-cell lymphoma of other extranodal and solid organ sites: Secondary | ICD-10-CM | POA: Insufficient documentation

## 2024-01-28 LAB — CMP (CANCER CENTER ONLY)
ALT: 23 U/L (ref 0–44)
AST: 27 U/L (ref 15–41)
Albumin: 3.4 g/dL — ABNORMAL LOW (ref 3.5–5.0)
Alkaline Phosphatase: 59 U/L (ref 38–126)
Anion gap: 9 (ref 5–15)
BUN: 9 mg/dL (ref 8–23)
CO2: 24 mmol/L (ref 22–32)
Calcium: 8.5 mg/dL — ABNORMAL LOW (ref 8.9–10.3)
Chloride: 105 mmol/L (ref 98–111)
Creatinine: 1.1 mg/dL (ref 0.61–1.24)
GFR, Estimated: >60 mL/min (ref >60)
Glucose, Bld: 102 mg/dL — ABNORMAL HIGH (ref 70–99)
Potassium: 3.8 mmol/L (ref 3.5–5.1)
Sodium: 138 mmol/L (ref 135–145)
Total Bilirubin: 0.6 mg/dL (ref 0.0–1.2)
Total Protein: 6.6 g/dL (ref 6.5–8.1)

## 2024-01-28 LAB — CBC WITH DIFFERENTIAL (CANCER CENTER ONLY)
Abs Immature Granulocytes: 0.86 K/uL — ABNORMAL HIGH (ref 0.00–0.07)
Basophils Absolute: 0.1 K/uL (ref 0.0–0.1)
Basophils Relative: 1 %
Eosinophils Absolute: 0 K/uL (ref 0.0–0.5)
Eosinophils Relative: 0 %
HCT: 31.1 % — ABNORMAL LOW (ref 39.0–52.0)
Hemoglobin: 10.3 g/dL — ABNORMAL LOW (ref 13.0–17.0)
Immature Granulocytes: 8 %
Lymphocytes Relative: 15 %
Lymphs Abs: 1.5 K/uL (ref 0.7–4.0)
MCH: 32.5 pg (ref 26.0–34.0)
MCHC: 33.1 g/dL (ref 30.0–36.0)
MCV: 98.1 fL (ref 80.0–100.0)
Monocytes Absolute: 0.8 K/uL (ref 0.1–1.0)
Monocytes Relative: 8 %
Neutro Abs: 7.2 K/uL (ref 1.7–7.7)
Neutrophils Relative %: 68 %
Platelet Count: 480 K/uL — ABNORMAL HIGH (ref 150–400)
RBC: 3.17 MIL/uL — ABNORMAL LOW (ref 4.22–5.81)
RDW: 16.5 % — ABNORMAL HIGH (ref 11.5–15.5)
WBC Count: 10.5 K/uL (ref 4.0–10.5)
nRBC: 0.4 % — ABNORMAL HIGH (ref 0.0–0.2)

## 2024-01-28 LAB — LACTATE DEHYDROGENASE: LDH: 220 U/L — ABNORMAL HIGH (ref 98–192)

## 2024-01-28 MED ORDER — APREPITANT 130 MG/18ML IV EMUL
130.0000 mg | Freq: Once | INTRAVENOUS | Status: AC
  Administered 2024-01-28: 130 mg via INTRAVENOUS
  Filled 2024-01-28: qty 18

## 2024-01-28 MED ORDER — DIPHENOXYLATE-ATROPINE 2.5-0.025 MG PO TABS: 1.0000 | ORAL_TABLET | Freq: Four times a day (QID) | ORAL | 1 refills | Status: DC | PRN

## 2024-01-28 MED ORDER — FAMOTIDINE IN NACL 20-0.9 MG/50ML-% IV SOLN
20.0000 mg | Freq: Once | INTRAVENOUS | Status: AC
  Administered 2024-01-28: 20 mg via INTRAVENOUS
  Filled 2024-01-28: qty 50

## 2024-01-28 MED ORDER — SODIUM CHLORIDE 0.9 % IV SOLN
INTRAVENOUS | Status: DC
  Filled 2024-01-28: qty 250

## 2024-01-28 MED ORDER — SODIUM CHLORIDE 0.9 % IV SOLN
750.0000 mg/m2 | Freq: Once | INTRAVENOUS | Status: AC
  Administered 2024-01-28: 1500 mg via INTRAVENOUS
  Filled 2024-01-28: qty 75

## 2024-01-28 MED ORDER — DOXORUBICIN HCL CHEMO IV INJECTION 2 MG/ML
50.0000 mg/m2 | Freq: Once | INTRAVENOUS | Status: AC
  Administered 2024-01-28: 106 mg via INTRAVENOUS
  Filled 2024-01-28: qty 53

## 2024-01-28 MED ORDER — PALONOSETRON HCL INJECTION 0.25 MG/5ML
0.2500 mg | Freq: Once | INTRAVENOUS | Status: AC
  Administered 2024-01-28: 0.25 mg via INTRAVENOUS
  Filled 2024-01-28: qty 5

## 2024-01-28 MED ORDER — SODIUM CHLORIDE 0.9 % IV SOLN
375.0000 mg/m2 | Freq: Once | INTRAVENOUS | Status: AC
  Administered 2024-01-28: 800 mg via INTRAVENOUS
  Filled 2024-01-28 (×2): qty 80
  Filled 2024-01-28: qty 30

## 2024-01-28 MED ORDER — DIPHENHYDRAMINE HCL 25 MG PO CAPS
50.0000 mg | ORAL_CAPSULE | Freq: Once | ORAL | Status: AC
  Administered 2024-01-28: 50 mg via ORAL
  Filled 2024-01-28: qty 2

## 2024-01-28 MED ORDER — MONTELUKAST SODIUM 10 MG PO TABS
10.0000 mg | ORAL_TABLET | Freq: Once | ORAL | Status: AC
  Administered 2024-01-28: 10 mg via ORAL
  Filled 2024-01-28: qty 1

## 2024-01-28 MED ORDER — DEXAMETHASONE SODIUM PHOSPHATE 10 MG/ML IJ SOLN
10.0000 mg | Freq: Once | INTRAMUSCULAR | Status: AC
  Administered 2024-01-28: 10 mg via INTRAVENOUS
  Filled 2024-01-28: qty 1

## 2024-01-28 MED ORDER — ACETAMINOPHEN 325 MG PO TABS
650.0000 mg | ORAL_TABLET | Freq: Once | ORAL | Status: AC
  Administered 2024-01-28: 650 mg via ORAL
  Filled 2024-01-28: qty 2

## 2024-01-28 MED ORDER — SODIUM CHLORIDE 0.9 % IV SOLN
160.0000 mg | Freq: Once | INTRAVENOUS | Status: AC
  Administered 2024-01-28: 160 mg via INTRAVENOUS
  Filled 2024-01-28: qty 6.5

## 2024-01-28 NOTE — Progress Notes (Signed)
 Nutrition Follow-up:  Patient with diffuse large B-cell lymphoma.  Receiving POLA-R-CHP.    Met with patient during infusion with wife.  Reports that appetite was still decreased week after treatment.  Has metallic taste in his mouth.  Was unable to find metaqil.  Says that sweets still taste ok, meats with sauce do better (meatballs, chicken pie).  No issues with constipation this time.     Medications: reviewed  Labs: reviewed  Anthropometrics:   Weight 188 lb 1.6 on 10/2 189 lb 8 oz on 9/11 189 lb 14.4 oz on 8/21 190 lb 14.7 oz on 8/14 219 lb 9.6 oz on 10/08/23   NUTRITION DIAGNOSIS: Inadequate oral intake improved, stable weight   INTERVENTION:  Reviewed where to purchase metaqil Continue to focus on foods that are working Continue bowel regimen    MONITORING, EVALUATION, GOAL: weight trends, intake   NEXT VISIT: Thursday, Oct 23 during infusion  Linet Brash B. Dasie SOLON, CSO, LDN Registered Dietitian [phone]

## 2024-01-28 NOTE — Progress Notes (Signed)
 [facility] CONSULT NOTE  Patient Care Team: Myrla Jon HERO, MD as PCP - General (Family Medicine) Verdene Gills, RN as Oncology Nurse Navigator [name], MD as Consulting Physician (Oncology)  CHIEF COMPLAINTS/PURPOSE OF CONSULTATION: DLBCL   Oncology History Overview Note    #  STAGE IV- JULY 2025- CT chest- Right hilar mass/mediastinal adenopathy.  MRI lumbar spine shows-L2 lesion.  MRI of the pelvis- Substantial metastatic burden to the bilateral iliac bones, right ischium, bilateral posterior acetabular walls, left pubic bone, central and right sacrum, bilateral femoral neck, and bilateral intertrochanteric region. In particular, tumor in the proximal femurs  Extraosseous extension of tumor from the dominant left iliac lesion, tracking along the left iliopsoas and upper pelvic sidewall; posteriorly along the gluteus minimus and gluteus medius, and even posteriorly along the lower paraspinal and medial left gluteus maximus musculature; Extraosseous extension of tumor from the right iliac lesion, tracking along the right iliopsoas and right gluteus minimus. Tumor along the attachment site of the right rectus femoris muscle, which could predispose to avulsion. Scattered small pelvic lymph nodes; Mildly prominent median lobe of prostate gland indents the bladder base.  12/07/2023- PET scan-   Hypermetabolic right hilar and subcarinal lymphadenopathy consistent with lymphoma. Hypermetabolic disease involving the left iliac bone and adjacent iliopsoas and gluteus musculature; Hypermetabolic disease involving the bony anatomy of the pelvis and proximal femurs bilaterally, proximal left femoral diaphysis, left-sided ribs, lumbar spine, and sacral spine as well as parasacral soft tissues.  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   KI-67 IS ESSENTIALLY 100%. TO EXCLUDE A SO-CALLED HIGH-GRADE B-CELL  LYMPHOMA/DOUBLE HIT LYMPHOMA FISH IS PENDING.   MUGA SCAN- 8/19-  hepatitis panel_NEG.     # AUG 14th, 2025-  rituximab  today-pending MUGA scan.   # AUG 21st, 2025- cycle #1 of chemotherapy POLA R-CHP    # Elevated AST ALT-question etiology.   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   DLBCL (diffuse large B cell lymphoma) (HCC)  12/01/2023 Initial Diagnosis   DLBCL (diffuse large B cell lymphoma) (HCC)   12/01/2023 Cancer Staging   Staging form: Hodgkin and Non-Hodgkin Lymphoma, AJCC 8th Edition - Clinical: Stage IV (Diffuse large B-cell lymphoma) - Signed by Pammy Vesey R, MD on 12/01/2023   12/10/2023 -  Chemotherapy   Patient is on Treatment Plan : NON-HODGKINS LYMPHOMA POLA-R-CHP D1 X 6 cycles / Rituximab  D1 q21d X 2 cycles       HISTORY OF PRESENTING ILLNESS: Patient ambulating-in with a cane. accompanied by wife.   Ruben Garcia 78 y.o.  male pleasant patient with a diffuse large B-cell lymphoma stage IV is here to proceed with on [place]a- RHP is here for a follow up and review the results of the interim PET scan.    Patient has intermittent diarrhea. Taking anti-diarrheals. Improved. Patient taking intermittent Percocet.  Overall pain significantly improved.  Review of Systems  Constitutional:  Positive for malaise/fatigue and weight loss. Negative for chills, diaphoresis and fever.  HENT:  Negative for nosebleeds and sore throat.   Eyes:  Negative for double vision.  Respiratory:  Negative for hemoptysis, sputum production, shortness of breath and wheezing.   Cardiovascular:  Negative for chest  pain, palpitations, orthopnea and leg swelling.  Gastrointestinal:  Negative for abdominal pain, blood in stool, constipation, diarrhea, heartburn, melena, nausea and  vomiting.  Genitourinary:  Negative for dysuria, frequency and urgency.  Musculoskeletal:  Positive for back pain, joint pain and myalgias.  Skin: Negative.  Negative for itching and rash.  Neurological:  Negative for dizziness, tingling, focal weakness, weakness and headaches.  Endo/Heme/Allergies:  Does not bruise/bleed easily.  Psychiatric/Behavioral:  Negative for depression. The patient is not nervous/anxious and does not have insomnia.     MEDICAL HISTORY:  Past Medical History:  Diagnosis Date   Allergic rhinitis, cause unspecified    Basal cell carcinoma of skin, site unspecified 04/28/2010   Nasal; [place].   BPH (benign prostatic hypertrophy)    Colon polyps    Essential hypertension, benign    Hemorrhoids, internal    Incomplete bladder emptying    Other abnormal glucose    Over weight    Personal history of colonic polyps    Personal history of other genital system and obstetric disorders(V13.29)    Pure hypercholesterolemia    Unspecified disorder of kidney and ureter    Unspecified disorder of liver     SURGICAL HISTORY: Past Surgical History:  Procedure Laterality Date   BASAL CELL CARCINOMA EXCISION  2012   Facial        [place]   COLONOSCOPY  11/13/2010   single polyp; repeatin 5 years; [place]   COLONOSCOPY N/A 11/04/2021   Procedure: COLONOSCOPY;  Surgeon: Maryruth Ole DASEN, MD;  Location: [facility] ENDOSCOPY;  Service: Endoscopy;  Laterality: N/A;  REQUEST EARLY TIME   COLONOSCOPY WITH PROPOFOL  N/A 12/13/2015   Procedure: COLONOSCOPY WITH PROPOFOL ;  Surgeon: Lamar DASEN Viktoria, MD;  Location: [facility] ENDOSCOPY;  Service: Endoscopy;  Laterality: N/A;   ear surgery  05/2012   [name].  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  Benign.   HEMORRHOID SURGERY  1978   [name]   IR IMAGING GUIDED PORT INSERTION  12/04/2023   LAPAROSCOPIC CHOLECYSTECTOMY  1982   rotator cuff surgery Right 05/14/2016   VASECTOMY  1978    SOCIAL HISTORY: Social History   Socioeconomic History   Marital  status: Married    Spouse name: Heron   Number of children: 2   Years of education: 2 years college   Highest education level: Not on file  Occupational History   Occupation: retired    Comment: postal service in 2005  Tobacco Use   Smoking status: Never   Smokeless tobacco: Never  Vaping Use   Vaping status: Never Used  Substance and Sexual Activity   Alcohol use: Not Currently    Alcohol/week: 5.0 standard drinks of alcohol    Types: 5 Cans of beer per week    Comment: weekends beer, 4- 6 total Cleotilde Mallory   Drug use: No   Sexual activity: Not Currently    Partners: Female  Other Topics Concern   Not on file  Social History Narrative   Always uses seat belts. Smoke alarm and carbon monoxide detector in the home. Guns in the home stored in locked cabinet.Caffeine use: Coffee 2 servings per day, moderate amount. Exercise: Moderate walking 2 - 4 miles daily, 2 - 3 days per week.    Marital status:  Married x 47 years, happily.      Children:  2 children; 1 grandchild (20).      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      Tobacco: never  Alcohol:  Weekends; beer 4-6 per weekend.      Exercise:  Walking every other day 2.5 miles three times per day.      Seatbelt: 100%; no texting      Guns: secured guns.      Living Will: completed in 2014.  FULL CODE but no prolonged measures.        ADLs: independent with ADLs; no assistant devices   Social Drivers of Health   Financial Resource Strain: Not on file  Food Insecurity: No Food Insecurity (11/27/2023)   Hunger Vital Sign    Worried About Running Out of Food in the Last Year: Never true    Ran Out of Food in the Last Year: Never true  Transportation Needs: No Transportation Needs (11/27/2023)   PRAPARE - Administrator, Civil Service (Medical): No    Lack of Transportation (Non-Medical): No  Physical Activity: Not on file  Stress: Not on file  Social Connections: Unknown (11/19/2023)    Social Connection and Isolation Panel    Frequency of Communication with Friends and Family: Three times a week    Frequency of Social Gatherings with Friends and Family: Three times a week    Attends Religious Services: Patient declined    Active Member of Clubs or Organizations: Patient declined    Attends Banker Meetings: Patient declined    Marital Status: Married  Catering manager Violence: Not At Risk (11/27/2023)   Humiliation, Afraid, Rape, and Kick questionnaire    Fear of Current or Ex-Partner: No    Emotionally Abused: No    Physically Abused: No    Sexually Abused: No    FAMILY HISTORY: Family History  Problem Relation Age of Onset   Heart disease Mother        first MI in 65s   Diabetes Mother    Hyperlipidemia Mother    Arthritis Mother        rheumatoid   Cancer Father        lung   Diabetes Father    Arthritis Sister    Hyperlipidemia Sister    Hyperlipidemia Sister    Hypertension Sister    Hyperlipidemia Sister    Kidney disease Neg Hx    Prostate cancer Neg Hx    Colon cancer Neg Hx     ALLERGIES:  is allergic to codeine.  MEDICATIONS:  Current Outpatient Medications  Medication Sig Dispense Refill   acetaminophen  (TYLENOL ) 325 MG tablet Take 2 tablets (650 mg total) by mouth every 6 (six) hours as needed for mild pain (pain score 1-3), fever, headache or moderate pain (pain score 4-6) (or Fever >/= 101).     acyclovir  (ZOVIRAX ) 400 MG tablet Take 1 tablet (400 mg total) by mouth 2 (two) times daily. [to prevent shingles] 60 tablet 3   albuterol  (VENTOLIN  HFA) 108 (90 Base) MCG/ACT inhaler Inhale 1-2 puffs into the lungs every 6 (six) hours as needed. 18 g 0   allopurinol  (ZYLOPRIM ) 300 MG tablet Take 1 tablet (300 mg total) by mouth 2 (two) times daily. 120 tablet 0   cyclobenzaprine  (FLEXERIL ) 10 MG tablet Take 1 tablet (10 mg total) by mouth 3 (three) times daily as needed for muscle spasms. 30 tablet 2   diphenoxylate-atropine  (LOMOTIL) 2.5-0.025 MG tablet Take 1 tablet by mouth 4 (four) times daily as needed for diarrhea or loose stools. 45 tablet 1   lactulose  (CHRONULAC ) 10 GM/15ML solution Take 15 mLs (10 g total) by  mouth daily as needed for mild constipation. 236 mL 0   lidocaine -prilocaine  (EMLA ) cream Apply 1 Application topically as needed. Apply to port and cover with saran wrap 1-2 hours prior to port access 30 g 1   ondansetron  (ZOFRAN ) 4 MG tablet Take 1 tablet (4 mg total) by mouth every 8 (eight) hours as needed for nausea or vomiting. 30 tablet 0   pantoprazole  (PROTONIX ) 20 MG tablet Take 1 tablet (20 mg total) by mouth daily. 30 tablet 0   predniSONE  (DELTASONE ) 50 MG tablet Take 2 tablets once day x 5 days. START on day of your chemotherapy. Take in AM; with FOOD. 10 tablet 5   prochlorperazine  (COMPAZINE ) 10 MG tablet Take 1 tablet (10 mg total) by mouth every 6 (six) hours as needed for nausea or vomiting. 30 tablet 0   traMADol  (ULTRAM ) 50 MG tablet Take 1 tablet (50 mg total) by mouth every 6 (six) hours as needed. 30 tablet 0   No current facility-administered medications for this visit.   Facility-Administered Medications Ordered in Other Visits  Medication Dose Route Frequency Provider Last Rate Last Admin   0.9 %  sodium chloride  infusion   Intravenous Continuous Rennie, Antron Seth R, MD       aprepitant  (CINVANTI ) injection 130 mg  130 mg Intravenous Once Yuritzi Kamp R, MD       cyclophosphamide  (CYTOXAN ) 1,500 mg in sodium chloride  0.9 % 250 mL chemo infusion  750 mg/m2 (Treatment Plan Recorded) Intravenous Once Cydni Reddoch R, MD       dexamethasone  (DECADRON ) injection 10 mg  10 mg Intravenous Once Emmaleigh Longo R, MD       DOXOrubicin  (ADRIAMYCIN ) chemo injection 106 mg  50 mg/m2 (Treatment Plan Recorded) Intravenous Once Tocara Mennen R, MD       famotidine  (PEPCID ) IVPB 20 mg premix  20 mg Intravenous Once Ambra Haverstick R, MD       palonosetron  (ALOXI )  injection 0.25 mg  0.25 mg Intravenous Once Shontelle Muska R, MD       polatuzumab vedotin -piiq (POLIVY ) 170 mg in sodium chloride  0.9 % 100 mL (1.5668 mg/mL) chemo infusion  1.8 mg/kg (Treatment Plan Recorded) Intravenous Once Jocelyn Lowery R, MD       riTUXimab -pvvr (RUXIENCE ) 800 mg in sodium chloride  0.9 % 170 mL infusion  375 mg/m2 (Treatment Plan Recorded) Intravenous Once Ainhoa Rallo R, MD        PHYSICAL EXAMINATION:   Vitals:   01/28/24 0823  BP: 128/78  Pulse: (!) 112  Resp: 16  Temp: 98.4 F (36.9 C)  SpO2: 99%   Filed Weights   01/28/24 0823  Weight: 188 lb 1.6 oz (85.3 kg)   Patient is wheezing bilaterally.  Physical Exam Vitals and nursing note reviewed.  HENT:     Head: Normocephalic and atraumatic.     Mouth/Throat:     Pharynx: Oropharynx is clear.  Eyes:     Extraocular Movements: Extraocular movements intact.     Pupils: Pupils are equal, round, and reactive to light.  Cardiovascular:     Rate and Rhythm: Normal rate and regular rhythm.  Pulmonary:     Comments: Decreased breath sounds bilaterally.  Abdominal:     Palpations: Abdomen is soft.  Musculoskeletal:        General: Normal range of motion.     Cervical back: Normal range of motion.  Skin:    General: Skin is warm.  Neurological:     General: No focal deficit  present.     Mental Status: He is alert and oriented to person, place, and time.  Psychiatric:        Behavior: Behavior normal.        Judgment: Judgment normal.     LABORATORY DATA:  I have reviewed the data as listed Lab Results  Component Value Date   WBC 10.5 01/28/2024   HGB 10.3 (L) 01/28/2024   HCT 31.1 (L) 01/28/2024   MCV 98.1 01/28/2024   PLT 480 (H) 01/28/2024   Recent Labs    03/31/23 0930 10/08/23 0900 11/19/23 1347 11/20/23 0511 12/17/23 0819 01/07/24 0805 01/28/24 0810  NA 143   < >  --    < > 135 137 138  K 4.9   < >  --    < > 4.1 3.7 3.8  CL 103   < >  --    < > 100 104 105   CO2 26   < >  --    < > 26 24 24   GLUCOSE 103*   < >  --    < > 87 101* 102*  BUN 11   < >  --    < > 22 5* 9  CREATININE 1.21   < >  --    < > 1.08 1.02 1.10  CALCIUM  10.0   < >  --    < > 9.1 8.1* 8.5*  GFRNONAA  --    < >  --    < > >60 >60 >60  PROT 7.4   < >  --    < > 6.8 6.5 6.6  ALBUMIN 4.7   < >  --    < > 3.4* 3.3* 3.4*  AST 34   < >  --    < > 33 29 27  ALT 38   < >  --    < > 54* 31 23  ALKPHOS 52   < >  --    < > 109 69 59  BILITOT 1.7*  1.9*   < > 2.5*   < > 1.6* 0.7 0.6  BILIDIR 0.40  --  0.3*  --   --   --   --   IBILI 1.30*  --  2.2*  --   --   --   --    < > = values in this interval not displayed.    RADIOGRAPHIC STUDIES: I have personally reviewed the radiological images as listed and agreed with the findings in the report. NM PET Image Restage (PS) Skull Base to Thigh (F-18 FDG) Result Date: 01/21/2024 EXAM: PET AND CT SKULL BASE TO MID THIGH 01/20/2024 11:59:24 AM TECHNIQUE: RADIOPHARMACEUTICAL: 9.9 mCi F-18 FDG Uptake time 60 minutes. Glucose level 129 mg/dl. PET imaging was acquired from the base of the skull to the mid thighs. Non-contrast enhanced computed tomography was obtained for attenuation correction and anatomic localization. COMPARISON: 12/07/2023 CLINICAL HISTORY: Lymphoma. 9.9 mCi F-18 FDG injected RT forearm at 10:42. Fasting BS = 129 mg/dl. Diffuse large B-cell lymphoma of intra-abdominal lymph nodes. NPO and DM-. Recent steroids for wheezing. 11/24/23: Bx Lt gluteal soft tissue mass. 12/01/23: Dx Stage IV. 12/04/23: Port placed. 12/07/23: Initial PET CT. 01/07/24: Rutiximab. FINDINGS: HEAD AND NECK: No metabolically active cervical lymphadenopathy. No cervical adenopathy. Bilateral carotid atherosclerosis. CHEST: Significant improvement in right-sided mediastinal nodal hypermetabolism. There is residual hypermetabolic soft tissue thickening centered about the central right mainstem bronchus including at SUV 7.6 on image  61/6. This is the site of right hilar  mass on the prior which measured SUV 12.4. The subcarinal nodal hypermetabolism has resolved. No pulmonary parenchymal hypermetabolism. Right port-a-cath tip high right atrium. Aortic and coronary artery calcifications. Trace bilateral pleural thickening. ABDOMEN AND PELVIS: No splenic or abdominal pelvic nodal hypermetabolism. Cholecystectomy. Abdominal aortic atherosclerosis. Interpolar left renal 1.5 cm left renal lesion with similar in size to on the prior and not hypermetabolic. Measures greater than fluid density. Fat-containing left inguinal hernia. BONES AND SOFT TISSUE: The previously described multifocal osseous involvement has resolved. No residual left iliopsoas or gluteal muscular hypermetabolism. There is mild diffuse marrow activity which is most likely related to stimulation by chemotherapy. Scattered sclerotic lesions including within the left hemipelvis and right-sided L2 vertebral body are likely bone islands. IMPRESSION: 1. Marked, near complete response to therapy of lymphoma. The only residual active disease is centered about the proximal right main stem bronchus, as evidenced by soft tissue thickening and hypermetabolism. dauville score 4. 2. Similar size of a 1.5 cm left renal lesion which measures greater than fluid density. Complex cyst is favored over a solid neoplasm, given lack of significant activity. This could either be reevaluated on follow up staging cts / pets or further evaluated with pre and post-contrast abdominal MRI. 3. incidental findings, including: Aortic atherosclerosis (icd10-i70.0). Coronary artery atherosclerosis. Electronically signed by: Rockey Kilts MD 01/21/2024 01:31 PM EDT RP Workstation: [id number]   DG FEMUR MIN 2 VIEWS LEFT Result Date: 12/31/2023 CLINICAL DATA:  History of diffuse large B-cell lymphoma with left hip pain EXAM: LEFT FEMUR 2 VIEWS; PELVIS - 1 VIEW COMPARISON:  Nuclear medicine PET dated 12/07/2023 FINDINGS: There is no evidence of fracture.  Heterogeneously sclerotic appearance of the left pelvis and subtle sclerosis of the left intertrochanteric femur. Soft tissues are unremarkable. Surgical clips project over the left hemipelvis. IMPRESSION: Heterogeneously sclerotic appearance of the left pelvis and subtle sclerosis of the left intertrochanteric femur, likely related to known lymphomatous involvement. No pathologic fracture. Electronically Signed   By: Limin  Xu M.D.   On: 12/31/2023 13:31   DG Pelvis 1-2 Views Result Date: 12/31/2023 CLINICAL DATA:  History of diffuse large B-cell lymphoma with left hip pain EXAM: LEFT FEMUR 2 VIEWS; PELVIS - 1 VIEW COMPARISON:  Nuclear medicine PET dated 12/07/2023 FINDINGS: There is no evidence of fracture. Heterogeneously sclerotic appearance of the left pelvis and subtle sclerosis of the left intertrochanteric femur. Soft tissues are unremarkable. Surgical clips project over the left hemipelvis. IMPRESSION: Heterogeneously sclerotic appearance of the left pelvis and subtle sclerosis of the left intertrochanteric femur, likely related to known lymphomatous involvement. No pathologic fracture. Electronically Signed   By: Limin  Xu M.D.   On: 12/31/2023 13:31     DLBCL (diffuse large B cell lymphoma) (HCC) #  DLBCL- NON-GCB sub type- STAGE IV- JULY 2025- 12/07/2023- PET scan-   Hypermetabolic right hilar and subcarinal lymphadenopathy consistent with lymphoma. Hypermetabolic disease involving the left iliac bone and adjacent iliopsoas and gluteus musculature; Hypermetabolic disease involving the bony anatomy of the pelvis and proximal femurs bilaterally, proximal left femoral diaphysis, left-sided ribs, lumbar spine, and sacral spine as well as parasacral soft tissues.   # JULY 2025- Soft tissue, biopsy, left gluteal soft tissue mass 7.5 cm: MORPHOLOGICALLY CONSISTENT WITH DIFFUSE LARGE B-CELL LYMPHOMA NON-GCB stubtype- - THE CELLS OF   INTEREST ARE B CELLS (CD20/CD45 POSITIVE WHICH COEXPRESS MUM1 AND BCL6,  BUT ARE NEGATIVE FOR CD10.   KI-67 IS ESSENTIALLY 100%. TO EXCLUDE  A SO-CALLED HIGH-GRADE B-CELL  LYMPHOMA/DOUBLE HIT LYMPHOMA FISH IS-negative for MYC gene rearrangement [however MYC gain/trisomy noted; IgH gene rearrangement noted but atypical].   hepatitis panel NEG. AUG 19th, 2025-  MUGA scan- left ventricular ejection fraction equals 53.5 %. 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 # s/p 2 cycles of Chemo- PET scan: sep 23rd, 2025- . Marked, near complete response to therapy of lymphoma. The only residual active disease is centered about the proximal right main stem bronchus, as evidenced by soft tissue thickening and hypermetabolism. dauville score 4.   # proceed with cycle # 3  POLA R-CHP chemotherapy every 3 weeks x 6 cycles.  Labs-CBC/chemistries were reviewed with the patient.   #  Diarrhea- G-2 sec to chemo- continue prn immoiudm/lomitil prn.   # ID prophylaxis-recommend acyclovir  400 twice daily; Bactrim  Monday Wednesday Friday.  Tumor lysis prophylaxis-allopurinol  300 mg a day   # Lower back pain-secondary to presumed malignancy involving the L2/sacrum--again discussed importance of walking with support given involvement of the femur. Stable. continue Percocet prn. No fractures- noted.   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   #Incidental findings on Imaging  PET- CT , 2025:  Similar size of a 1.5 cm left renal lesion which measures greater than fluiddensity. Complex cyst is favored over a solid neoplasm, given lack of significant activity-  pre and post-contrast abdominal MRI- will plan down the line- I reviewed/discussed/counseled the patient.    # IV access: s/p Mediport placement.   PS-  # DISPOSITION: # chemo today-; d-2 injection- ok with HR #  follow in 3 weeks- X-MD [Dr.Rao/Yu]  port labs- cbc/cmp; LDH; vit D 1, 25- OH chemo- d-2 injection- # follow in 6 weeks- MD port-abs- cbc/cmp; LDH;  chemo- d-2 injection-     Dr.B      Cindy JONELLE Joe, MD 01/28/2024 9:35 AM

## 2024-01-28 NOTE — Progress Notes (Signed)
 PET 01/20/24.

## 2024-01-28 NOTE — Progress Notes (Signed)
 Patient's weight had decreased from 89.7 kg from start of treatment to 85.3 kg today.  Polivy  dose of 170 mg is outside of upper limit of dosing range for a dose of 1.8 mg/kg with new weight.  Per Dr. Rennie, will decrease Polivy  dose to 160 mg (~1.9 mg/kg).  Duwaine Shin, PharmD, MS

## 2024-01-29 ENCOUNTER — Inpatient Hospital Stay

## 2024-01-29 DIAGNOSIS — C8333 Diffuse large B-cell lymphoma, intra-abdominal lymph nodes: Secondary | ICD-10-CM

## 2024-01-29 DIAGNOSIS — Z5112 Encounter for antineoplastic immunotherapy: Secondary | ICD-10-CM | POA: Diagnosis not present

## 2024-01-29 MED ORDER — PEGFILGRASTIM-JMDB 6 MG/0.6ML ~~LOC~~ SOSY
6.0000 mg | PREFILLED_SYRINGE | Freq: Once | SUBCUTANEOUS | Status: AC
  Administered 2024-01-29: 6 mg via SUBCUTANEOUS
  Filled 2024-01-29: qty 0.6

## 2024-02-04 ENCOUNTER — Encounter: Payer: Self-pay | Admitting: Internal Medicine

## 2024-02-04 ENCOUNTER — Telehealth: Payer: Self-pay | Admitting: Internal Medicine

## 2024-02-04 NOTE — Telephone Encounter (Signed)
 I called the patient discussed -intrathecal chemotherapy given his high risk IPI.  Unable to reach the patient left a voicemail to call us  back.   GB  FYI

## 2024-02-08 ENCOUNTER — Other Ambulatory Visit: Payer: Self-pay | Admitting: Nurse Practitioner

## 2024-02-08 ENCOUNTER — Telehealth: Payer: Self-pay | Admitting: *Deleted

## 2024-02-08 DIAGNOSIS — R053 Chronic cough: Secondary | ICD-10-CM

## 2024-02-08 MED ORDER — HYDROCOD POLI-CHLORPHE POLI ER 10-8 MG/5ML PO SUER: 5.0000 mL | Freq: Every evening | ORAL | 0 refills | Status: DC | PRN

## 2024-02-08 MED ORDER — BENZONATATE 100 MG PO CAPS: 100.0000 mg | ORAL_CAPSULE | Freq: Three times a day (TID) | ORAL | 0 refills | Status: DC | PRN

## 2024-02-08 NOTE — Progress Notes (Signed)
 Spoke to patient by phone. He complains of ongoing cough x 3 weeks. He has known disease around the right bronchus and right hilar mass and I question if this is reactive. No fevers, chills. No response to OTCs. Prescription for tessalon  sent and tussionex for nocturnal cough. Reviewed risks of narcotic cough medications. If ongoing or worsening symptoms, contact clinic for in person evaluation.

## 2024-02-08 NOTE — Telephone Encounter (Signed)
 Patient has a bad cough but  gets Hacky ,worse in the night he has been having this for like 5 weeks he says and when he is trying to talk to over the phone you can hear him wheezing sometimes.  Would like to have some cough syrup

## 2024-02-08 NOTE — Telephone Encounter (Signed)
 He called and because of the cough and he told him that she was having her old people that she had to take care of as well as Dr. Rennie [place].  Time she have to do is wait till the end of the day so that they give us  all the information to help you.  Lauren is calling him right now and sending him some cough medicine

## 2024-02-09 ENCOUNTER — Encounter: Payer: Self-pay | Admitting: Internal Medicine

## 2024-02-10 ENCOUNTER — Encounter: Payer: Self-pay | Admitting: Internal Medicine

## 2024-02-11 ENCOUNTER — Encounter: Payer: Self-pay | Admitting: Internal Medicine

## 2024-02-11 ENCOUNTER — Encounter: Payer: Self-pay | Admitting: Urology

## 2024-02-12 ENCOUNTER — Other Ambulatory Visit: Payer: Self-pay

## 2024-02-18 ENCOUNTER — Inpatient Hospital Stay

## 2024-02-18 ENCOUNTER — Encounter: Payer: Self-pay | Admitting: Oncology

## 2024-02-18 ENCOUNTER — Inpatient Hospital Stay (HOSPITAL_BASED_OUTPATIENT_CLINIC_OR_DEPARTMENT_OTHER): Admitting: Oncology

## 2024-02-18 VITALS — BP 132/85 | HR 91 | Temp 97.5°F | Resp 16

## 2024-02-18 VITALS — BP 112/92 | HR 119 | Temp 97.0°F | Resp 16 | Wt 181.2 lb

## 2024-02-18 DIAGNOSIS — E876 Hypokalemia: Secondary | ICD-10-CM | POA: Diagnosis not present

## 2024-02-18 DIAGNOSIS — C8333 Diffuse large B-cell lymphoma, intra-abdominal lymph nodes: Secondary | ICD-10-CM

## 2024-02-18 DIAGNOSIS — C7951 Secondary malignant neoplasm of bone: Secondary | ICD-10-CM

## 2024-02-18 DIAGNOSIS — Z5112 Encounter for antineoplastic immunotherapy: Secondary | ICD-10-CM | POA: Diagnosis not present

## 2024-02-18 LAB — CMP (CANCER CENTER ONLY)
ALT: 24 U/L (ref 0–44)
AST: 30 U/L (ref 15–41)
Albumin: 3.7 g/dL (ref 3.5–5.0)
Alkaline Phosphatase: 64 U/L (ref 38–126)
Anion gap: 9 (ref 5–15)
BUN: 7 mg/dL — ABNORMAL LOW (ref 8–23)
CO2: 25 mmol/L (ref 22–32)
Calcium: 8.9 mg/dL (ref 8.9–10.3)
Chloride: 102 mmol/L (ref 98–111)
Creatinine: 0.94 mg/dL (ref 0.61–1.24)
GFR, Estimated: >60 mL/min (ref >60)
Glucose, Bld: 111 mg/dL — ABNORMAL HIGH (ref 70–99)
Potassium: 3.3 mmol/L — ABNORMAL LOW (ref 3.5–5.1)
Sodium: 136 mmol/L (ref 135–145)
Total Bilirubin: 0.9 mg/dL (ref 0.0–1.2)
Total Protein: 6.8 g/dL (ref 6.5–8.1)

## 2024-02-18 LAB — LACTATE DEHYDROGENASE: LDH: 225 U/L — ABNORMAL HIGH (ref 98–192)

## 2024-02-18 LAB — CBC WITH DIFFERENTIAL (CANCER CENTER ONLY)
Abs Immature Granulocytes: 0.65 K/uL — ABNORMAL HIGH (ref 0.00–0.07)
Basophils Absolute: 0.1 K/uL (ref 0.0–0.1)
Basophils Relative: 1 %
Eosinophils Absolute: 0 K/uL (ref 0.0–0.5)
Eosinophils Relative: 0 %
HCT: 32.2 % — ABNORMAL LOW (ref 39.0–52.0)
Hemoglobin: 10.8 g/dL — ABNORMAL LOW (ref 13.0–17.0)
Immature Granulocytes: 8 %
Lymphocytes Relative: 14 %
Lymphs Abs: 1.2 K/uL (ref 0.7–4.0)
MCH: 32.8 pg (ref 26.0–34.0)
MCHC: 33.5 g/dL (ref 30.0–36.0)
MCV: 97.9 fL (ref 80.0–100.0)
Monocytes Absolute: 0.8 K/uL (ref 0.1–1.0)
Monocytes Relative: 9 %
Neutro Abs: 6 K/uL (ref 1.7–7.7)
Neutrophils Relative %: 68 %
Platelet Count: 403 K/uL — ABNORMAL HIGH (ref 150–400)
RBC: 3.29 MIL/uL — ABNORMAL LOW (ref 4.22–5.81)
RDW: 17 % — ABNORMAL HIGH (ref 11.5–15.5)
Smear Review: NORMAL
WBC Count: 8.7 K/uL (ref 4.0–10.5)
nRBC: 0.2 % (ref 0.0–0.2)

## 2024-02-18 LAB — VITAMIN D 25 HYDROXY (VIT D DEFICIENCY, FRACTURES): Vit D, 25-Hydroxy: 36.25 ng/mL (ref 30–100)

## 2024-02-18 MED ORDER — PALONOSETRON HCL INJECTION 0.25 MG/5ML
0.2500 mg | Freq: Once | INTRAVENOUS | Status: AC
  Administered 2024-02-18: 0.25 mg via INTRAVENOUS

## 2024-02-18 MED ORDER — SODIUM CHLORIDE 0.9 % IV SOLN
160.0000 mg | Freq: Once | INTRAVENOUS | Status: AC
  Administered 2024-02-18: 160 mg via INTRAVENOUS
  Filled 2024-02-18: qty 7

## 2024-02-18 MED ORDER — DOXORUBICIN HCL CHEMO IV INJECTION 2 MG/ML
50.0000 mg/m2 | Freq: Once | INTRAVENOUS | Status: AC
  Administered 2024-02-18: 106 mg via INTRAVENOUS
  Filled 2024-02-18: qty 53

## 2024-02-18 MED ORDER — APREPITANT 130 MG/18ML IV EMUL
130.0000 mg | Freq: Once | INTRAVENOUS | Status: AC
  Administered 2024-02-18: 130 mg via INTRAVENOUS

## 2024-02-18 MED ORDER — DIPHENHYDRAMINE HCL 25 MG PO CAPS
50.0000 mg | ORAL_CAPSULE | Freq: Once | ORAL | Status: AC
  Administered 2024-02-18: 50 mg via ORAL
  Filled 2024-02-18: qty 2

## 2024-02-18 MED ORDER — MONTELUKAST SODIUM 10 MG PO TABS
10.0000 mg | ORAL_TABLET | Freq: Once | ORAL | Status: AC
  Administered 2024-02-18: 10 mg via ORAL
  Filled 2024-02-18: qty 1

## 2024-02-18 MED ORDER — DEXAMETHASONE SOD PHOSPHATE PF 10 MG/ML IJ SOLN
10.0000 mg | Freq: Once | INTRAMUSCULAR | Status: AC
  Administered 2024-02-18: 10 mg via INTRAVENOUS

## 2024-02-18 MED ORDER — ACETAMINOPHEN 325 MG PO TABS
650.0000 mg | ORAL_TABLET | Freq: Once | ORAL | Status: AC
  Administered 2024-02-18: 650 mg via ORAL
  Filled 2024-02-18: qty 2

## 2024-02-18 MED ORDER — FAMOTIDINE IN NACL 20-0.9 MG/50ML-% IV SOLN
20.0000 mg | Freq: Once | INTRAVENOUS | Status: AC
  Administered 2024-02-18: 20 mg via INTRAVENOUS
  Filled 2024-02-18: qty 50

## 2024-02-18 MED ORDER — POTASSIUM CHLORIDE CRYS ER 20 MEQ PO TBCR: 20.0000 meq | EXTENDED_RELEASE_TABLET | Freq: Every day | ORAL | 0 refills | Status: DC

## 2024-02-18 MED ORDER — SODIUM CHLORIDE 0.9 % IV SOLN
750.0000 mg/m2 | Freq: Once | INTRAVENOUS | Status: AC
  Administered 2024-02-18: 1500 mg via INTRAVENOUS
  Filled 2024-02-18: qty 75

## 2024-02-18 MED ORDER — NYSTATIN 100000 UNIT/ML MT SUSP: 5.0000 mL | Freq: Four times a day (QID) | OROMUCOSAL | 0 refills | Status: DC

## 2024-02-18 MED ORDER — SODIUM CHLORIDE 0.9 % IV SOLN
375.0000 mg/m2 | Freq: Once | INTRAVENOUS | Status: AC
  Administered 2024-02-18: 800 mg via INTRAVENOUS
  Filled 2024-02-18: qty 30

## 2024-02-18 MED ORDER — SODIUM CHLORIDE 0.9 % IV SOLN
INTRAVENOUS | Status: DC
  Filled 2024-02-18: qty 250

## 2024-02-18 NOTE — Assessment & Plan Note (Deleted)
#   JULY [id number]- 78 year old male patient non-smoker and no prior history of malignancy recently admitted to hospital for hypercalcemia/renal insufficiency-worsening back pain- CT chest- Right hilar mass/mediastinal adenopathy.  MRI lumbar spine shows-L2 lesion.  MRI of the pelvis- Substantial metastatic burden to the bilateral iliac bones, right ischium, bilateral posterior acetabular walls, left pubic bone, central and right sacrum, bilateral femoral neck, and bilateral intertrochanteric region. In particular, tumor in the proximal femurs  Extraosseous extension of tumor from the dominant left iliac lesion, tracking along the left iliopsoas and upper pelvic sidewall; posteriorly along the gluteus minimus and gluteus medius, and even posteriorly along the lower paraspinal and medial left gluteus maximus musculature; Extraosseous extension of tumor from the right iliac lesion, tracking along the right iliopsoas and right gluteus minimus. Tumor along the attachment site of the right rectus femoris muscle, which could predispose to avulsion. Scattered small pelvic lymph nodes; Mildly prominent median lobe of prostate gland indents the bladder base.  Labs are reviewed and discussed with patient. Proceed with cycle 4 Pola R-CHP    # Lower back pain-secondary to presumed malignancy involving the L2/sacrum-started patient on fentanyl  patch 12.5 mcg; continue Percocet 1 pill every 6 hours as needed.  Discussed regarding potential side effects of narcotics including but not limited to dizziness and constipation.  Given the concern for predisposition of patient to pathologic fracture-recommend evaluation with radiation oncology ASAP.  Continue conservative measures like walking with a walker.  Consider evaluation at [facility] orthopedics for stabilization.  # Wheezing-reactive airway/cough-recommend prednisone .  Prescription sent.   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  Calcitriol  level 198-question etiology recommend holding any supplemental calcium  plus vitamin D.  # Elevated AST ALT-question etiology.  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  Elevated alkaline phosphatase likely due to bone mets.   # IV access: Poor IV access recommend Mediport placement.  # DISPOSITION: # refer to Dr.Chrystal re: pelvic mass # referral to IR re; port placement # follow up TBD- Dr.B

## 2024-02-18 NOTE — Progress Notes (Signed)
 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  MRI lumbar spine shows-L2 lesion.  MRI of the pelvis- Substantial metastatic burden to the bilateral iliac bones, right ischium, bilateral posterior acetabular walls, left pubic bone, central and right sacrum, bilateral femoral neck, and bilateral intertrochanteric region. In particular, tumor in the proximal femurs  Extraosseous extension of tumor from the dominant left iliac lesion, tracking along the left iliopsoas and upper pelvic sidewall; posteriorly along the gluteus minimus and gluteus medius, and even posteriorly along the lower paraspinal and medial left gluteus maximus musculature; Extraosseous extension of tumor from the right iliac lesion, tracking along the right iliopsoas and right gluteus minimus. Tumor along the attachment site of the right rectus femoris muscle, which could predispose to avulsion. Scattered small pelvic lymph nodes; Mildly prominent median lobe of prostate gland indents the bladder base.  12/07/2023- PET scan-   Hypermetabolic right hilar and subcarinal lymphadenopathy consistent with lymphoma. Hypermetabolic disease involving the left iliac bone and adjacent iliopsoas and gluteus musculature; Hypermetabolic disease involving the bony anatomy of the pelvis and proximal femurs bilaterally, proximal left femoral diaphysis, left-sided ribs, lumbar spine, and sacral spine as well as parasacral soft tissues.  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   KI-67 IS ESSENTIALLY 100%. TO EXCLUDE A SO-CALLED HIGH-GRADE B-CELL  LYMPHOMA/DOUBLE HIT LYMPHOMA FISH IS PENDING.   MUGA SCAN- 8/19-  hepatitis panel_NEG.     # AUG 14th, 2025-  rituximab  today-pending MUGA scan.   # AUG 21st, 2025- cycle #1 of chemotherapy POLA R-CHP    # Elevated AST ALT-question etiology.   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   DLBCL (diffuse large B cell lymphoma) (HCC)  12/01/2023 Initial Diagnosis   DLBCL (diffuse large B cell lymphoma) (HCC)   12/01/2023 Cancer Staging   Staging form: Hodgkin and Non-Hodgkin Lymphoma, AJCC 8th Edition - Clinical: Stage IV (Diffuse large B-cell lymphoma) - Signed by Brahmanday, Govinda R, MD on 12/01/2023   12/10/2023 -  Chemotherapy   Patient is on Treatment Plan : NON-HODGKINS LYMPHOMA POLA-R-CHP D1 X 6 cycles / Rituximab  D1 q21d X 2 cycles       HISTORY OF PRESENTING ILLNESS: Patient ambulating-in with a cane. accompanied by wife.   Patient follows up with Dr.Brahmanday who is off today, I am covering him to see this patient.   Ruben Garcia 78 y.o.  male pleasant patient with a diffuse large B-cell lymphoma stage IV is here to proceed with on Pola- RCHP is here for evaluation prior to chemotherapy.  Patient has intermittent diarrhea.  Symptoms are managed with Imodium.  Patient taking intermittent Percocet.  Overall pain significantly improved. + Fatigue, appetite is not good.  He has lost a 6 pound.  Review of Systems  Constitutional:  Positive for malaise/fatigue and weight loss. Negative for chills, diaphoresis and fever.  HENT:  Negative for nosebleeds and sore throat.  Eyes:  Negative for double vision.  Respiratory:  Negative for hemoptysis, sputum production, shortness of breath and wheezing.   Cardiovascular:  Negative for chest pain, palpitations, orthopnea and leg  swelling.  Gastrointestinal:  Negative for abdominal pain, blood in stool, constipation, diarrhea, heartburn, melena, nausea and vomiting.  Genitourinary:  Negative for dysuria, frequency and urgency.  Musculoskeletal:  Positive for back pain, joint pain and myalgias.  Skin: Negative.  Negative for itching and rash.  Neurological:  Negative for dizziness, tingling, focal weakness, weakness and headaches.  Endo/Heme/Allergies:  Does not bruise/bleed easily.  Psychiatric/Behavioral:  Negative for depression. The patient is not nervous/anxious and does not have insomnia.     MEDICAL HISTORY:  Past Medical History:  Diagnosis Date   Allergic rhinitis, cause unspecified    Basal cell carcinoma of skin, site unspecified 04/28/2010   Nasal; [place].   BPH (benign prostatic hypertrophy)    Colon polyps    Essential hypertension, benign    Hemorrhoids, internal    Incomplete bladder emptying    Other abnormal glucose    Over weight    Personal history of colonic polyps    Personal history of other genital system and obstetric disorders(V13.29)    Pure hypercholesterolemia    Unspecified disorder of kidney and ureter    Unspecified disorder of liver     SURGICAL HISTORY: Past Surgical History:  Procedure Laterality Date   BASAL CELL CARCINOMA EXCISION  2012   Facial        [place]   COLONOSCOPY  11/13/2010   single polyp; repeatin 5 years; [place]   COLONOSCOPY N/A 11/04/2021   Procedure: COLONOSCOPY;  Surgeon: Maryruth Ole DASEN, MD;  Location: [facility] ENDOSCOPY;  Service: Endoscopy;  Laterality: N/A;  REQUEST EARLY TIME   COLONOSCOPY WITH PROPOFOL  N/A 12/13/2015   Procedure: COLONOSCOPY WITH PROPOFOL ;  Surgeon: Lamar DASEN Viktoria, MD;  Location: [facility] ENDOSCOPY;  Service: Endoscopy;  Laterality: N/A;   ear surgery  05/2012   [name].  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  Benign.   HEMORRHOID SURGERY  1978   [name]   IR IMAGING GUIDED PORT INSERTION  12/04/2023   LAPAROSCOPIC CHOLECYSTECTOMY  1982    rotator cuff surgery Right 05/14/2016   VASECTOMY  1978    SOCIAL HISTORY: Social History   Socioeconomic History   Marital status: Married    Spouse name: Heron   Number of children: 2   Years of education: 2 years college   Highest education level: Not on file  Occupational History   Occupation: retired    Comment: postal service in 2005  Tobacco Use   Smoking status: Never   Smokeless tobacco: Never  Vaping Use   Vaping status: Never Used  Substance and Sexual Activity   Alcohol use: Not Currently    Alcohol/week: 5.0 standard drinks of alcohol    Types: 5 Cans of beer per week    Comment: weekends beer, 4- 6 total Cleotilde Mallory   Drug use: No   Sexual activity: Not Currently    Partners: Female  Other Topics Concern   Not on file  Social History Narrative   Always uses seat belts. Smoke alarm and carbon monoxide detector in the home. Guns in the home stored in locked cabinet.Caffeine use: Coffee 2 servings per day, moderate amount. Exercise: Moderate walking 2 - 4 miles daily, 2 - 3 days per week.    Marital status:  Married x 47 years, happily.      Children:  2 children;  1 grandchild (20).      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      Tobacco: never      Alcohol:  Weekends; beer 4-6 per weekend.      Exercise:  Walking every other day 2.5 miles three times per day.      Seatbelt: 100%; no texting      Guns: secured guns.      Living Will: completed in 2014.  FULL CODE but no prolonged measures.        ADLs: independent with ADLs; no assistant devices   Social Drivers of Health   Financial Resource Strain: Not on file  Food Insecurity: No Food Insecurity (11/27/2023)   Hunger Vital Sign    Worried About Running Out of Food in the Last Year: Never true    Ran Out of Food in the Last Year: Never true  Transportation Needs: No Transportation Needs (11/27/2023)   PRAPARE - Administrator, Civil Service (Medical): No    Lack of  Transportation (Non-Medical): No  Physical Activity: Not on file  Stress: Not on file  Social Connections: Unknown (11/19/2023)   Social Connection and Isolation Panel    Frequency of Communication with Friends and Family: Three times a week    Frequency of Social Gatherings with Friends and Family: Three times a week    Attends Religious Services: Patient declined    Active Member of Clubs or Organizations: Patient declined    Attends Banker Meetings: Patient declined    Marital Status: Married  Catering manager Violence: Not At Risk (11/27/2023)   Humiliation, Afraid, Rape, and Kick questionnaire    Fear of Current or Ex-Partner: No    Emotionally Abused: No    Physically Abused: No    Sexually Abused: No    FAMILY HISTORY: Family History  Problem Relation Age of Onset   Heart disease Mother        first MI in 2s   Diabetes Mother    Hyperlipidemia Mother    Arthritis Mother        rheumatoid   Cancer Father        lung   Diabetes Father    Arthritis Sister    Hyperlipidemia Sister    Hyperlipidemia Sister    Hypertension Sister    Hyperlipidemia Sister    Kidney disease Neg Hx    Prostate cancer Neg Hx    Colon cancer Neg Hx     ALLERGIES:  is allergic to codeine.  MEDICATIONS:  Current Outpatient Medications  Medication Sig Dispense Refill   acetaminophen  (TYLENOL ) 325 MG tablet Take 2 tablets (650 mg total) by mouth every 6 (six) hours as needed for mild pain (pain score 1-3), fever, headache or moderate pain (pain score 4-6) (or Fever >/= 101).     acyclovir  (ZOVIRAX ) 400 MG tablet Take 1 tablet (400 mg total) by mouth 2 (two) times daily. [to prevent shingles] 60 tablet 3   albuterol  (VENTOLIN  HFA) 108 (90 Base) MCG/ACT inhaler Inhale 1-2 puffs into the lungs every 6 (six) hours as needed. 18 g 0   allopurinol  (ZYLOPRIM ) 300 MG tablet Take 1 tablet (300 mg total) by mouth 2 (two) times daily. 120 tablet 0   benzonatate  (TESSALON ) 100 MG capsule  Take 1-2 capsules (100-200 mg total) by mouth 3 (three) times daily as needed for cough. 90 capsule 0   chlorpheniramine-HYDROcodone  (TUSSIONEX) 10-8 MG/5ML Take 5 mLs by mouth  at bedtime as needed for cough. 140 mL 0   cyclobenzaprine  (FLEXERIL ) 10 MG tablet Take 1 tablet (10 mg total) by mouth 3 (three) times daily as needed for muscle spasms. 30 tablet 2   diphenoxylate-atropine (LOMOTIL) 2.5-0.025 MG tablet Take 1 tablet by mouth 4 (four) times daily as needed for diarrhea or loose stools. 45 tablet 1   lactulose  (CHRONULAC ) 10 GM/15ML solution Take 15 mLs (10 g total) by mouth daily as needed for mild constipation. 236 mL 0   lidocaine -prilocaine  (EMLA ) cream Apply 1 Application topically as needed. Apply to port and cover with saran wrap 1-2 hours prior to port access 30 g 1   nystatin  (MYCOSTATIN ) 100000 UNIT/ML suspension Take 5 mLs (500,000 Units total) by mouth 4 (four) times daily. 473 mL 0   ondansetron  (ZOFRAN ) 4 MG tablet Take 1 tablet (4 mg total) by mouth every 8 (eight) hours as needed for nausea or vomiting. 30 tablet 0   pantoprazole  (PROTONIX ) 20 MG tablet Take 1 tablet (20 mg total) by mouth daily. 30 tablet 0   potassium chloride SA (KLOR-CON M) 20 MEQ tablet Take 1 tablet (20 mEq total) by mouth daily. 7 tablet 0   predniSONE  (DELTASONE ) 50 MG tablet Take 2 tablets once day x 5 days. START on day of your chemotherapy. Take in AM; with FOOD. 10 tablet 5   prochlorperazine  (COMPAZINE ) 10 MG tablet Take 1 tablet (10 mg total) by mouth every 6 (six) hours as needed for nausea or vomiting. 30 tablet 0   traMADol  (ULTRAM ) 50 MG tablet Take 1 tablet (50 mg total) by mouth every 6 (six) hours as needed. 30 tablet 0   No current facility-administered medications for this visit.    PHYSICAL EXAMINATION:   Vitals:   02/18/24 0853  BP: (!) 112/92  Pulse: (!) 119  Resp: 16  Temp: (!) 97 F (36.1 C)  SpO2: 97%   Filed Weights   02/18/24 0853  Weight: 181 lb 3.2 oz (82.2 kg)    Patient is wheezing bilaterally.  Physical Exam Vitals and nursing note reviewed.  HENT:     Head: Normocephalic and atraumatic.     Mouth/Throat:     Pharynx: Oropharynx is clear.  Eyes:     Extraocular Movements: Extraocular movements intact.     Pupils: Pupils are equal, round, and reactive to light.  Cardiovascular:     Rate and Rhythm: Regular rhythm. Tachycardia present.  Pulmonary:     Comments: Decreased breath sounds bilaterally.  Abdominal:     Palpations: Abdomen is soft.  Musculoskeletal:        General: Normal range of motion.     Cervical back: Normal range of motion.  Skin:    General: Skin is warm.  Neurological:     General: No focal deficit present.     Mental Status: He is alert and oriented to person, place, and time.  Psychiatric:        Behavior: Behavior normal.        Judgment: Judgment normal.     LABORATORY DATA:  I have reviewed the data as listed Lab Results  Component Value Date   WBC 8.7 02/18/2024   HGB 10.8 (L) 02/18/2024   HCT 32.2 (L) 02/18/2024   MCV 97.9 02/18/2024   PLT 403 (H) 02/18/2024   Recent Labs    03/31/23 0930 10/08/23 0900 11/19/23 1347 11/20/23 0511 01/07/24 0805 01/28/24 0810 02/18/24 0834  NA 143   < >  --    < >  137 138 136  K 4.9   < >  --    < > 3.7 3.8 3.3*  CL 103   < >  --    < > 104 105 102  CO2 26   < >  --    < > 24 24 25   GLUCOSE 103*   < >  --    < > 101* 102* 111*  BUN 11   < >  --    < > 5* 9 7*  CREATININE 1.21   < >  --    < > 1.02 1.10 0.94  CALCIUM  10.0   < >  --    < > 8.1* 8.5* 8.9  GFRNONAA  --    < >  --    < > >60 >60 >60  PROT 7.4   < >  --    < > 6.5 6.6 6.8  ALBUMIN 4.7   < >  --    < > 3.3* 3.4* 3.7  AST 34   < >  --    < > 29 27 30   ALT 38   < >  --    < > 31 23 24   ALKPHOS 52   < >  --    < > 69 59 64  BILITOT 1.7*  1.9*   < > 2.5*   < > 0.7 0.6 0.9  BILIDIR 0.40  --  0.3*  --   --   --   --   IBILI 1.30*  --  2.2*  --   --   --   --    < > = values in this interval  not displayed.    RADIOGRAPHIC STUDIES: I have personally reviewed the radiological images as listed and agreed with the findings in the report. NM PET Image Restage (PS) Skull Base to Thigh (F-18 FDG) Result Date: 01/21/2024 EXAM: PET AND CT SKULL BASE TO MID THIGH 01/20/2024 11:59:24 AM TECHNIQUE: RADIOPHARMACEUTICAL: 9.9 mCi F-18 FDG Uptake time 60 minutes. Glucose level 129 mg/dl. PET imaging was acquired from the base of the skull to the mid thighs. Non-contrast enhanced computed tomography was obtained for attenuation correction and anatomic localization. COMPARISON: 12/07/2023 CLINICAL HISTORY: Lymphoma. 9.9 mCi F-18 FDG injected RT forearm at 10:42. Fasting BS = 129 mg/dl. Diffuse large B-cell lymphoma of intra-abdominal lymph nodes. NPO and DM-. Recent steroids for wheezing. 11/24/23: Bx Lt gluteal soft tissue mass. 12/01/23: Dx Stage IV. 12/04/23: Port placed. 12/07/23: Initial PET CT. 01/07/24: Rutiximab. FINDINGS: HEAD AND NECK: No metabolically active cervical lymphadenopathy. No cervical adenopathy. Bilateral carotid atherosclerosis. CHEST: Significant improvement in right-sided mediastinal nodal hypermetabolism. There is residual hypermetabolic soft tissue thickening centered about the central right mainstem bronchus including at SUV 7.6 on image 61/6. This is the site of right hilar mass on the prior which measured SUV 12.4. The subcarinal nodal hypermetabolism has resolved. No pulmonary parenchymal hypermetabolism. Right port-a-cath tip high right atrium. Aortic and coronary artery calcifications. Trace bilateral pleural thickening. ABDOMEN AND PELVIS: No splenic or abdominal pelvic nodal hypermetabolism. Cholecystectomy. Abdominal aortic atherosclerosis. Interpolar left renal 1.5 cm left renal lesion with similar in size to on the prior and not hypermetabolic. Measures greater than fluid density. Fat-containing left inguinal hernia. BONES AND SOFT TISSUE: The previously described multifocal  osseous involvement has resolved. No residual left iliopsoas or gluteal muscular hypermetabolism. There is mild diffuse marrow activity which is most likely related to stimulation by chemotherapy. Scattered sclerotic lesions  including within the left hemipelvis and right-sided L2 vertebral body are likely bone islands. IMPRESSION: 1. Marked, near complete response to therapy of lymphoma. The only residual active disease is centered about the proximal right main stem bronchus, as evidenced by soft tissue thickening and hypermetabolism. dauville score 4. 2. Similar size of a 1.5 cm left renal lesion which measures greater than fluid density. Complex cyst is favored over a solid neoplasm, given lack of significant activity. This could either be reevaluated on follow up staging cts / pets or further evaluated with pre and post-contrast abdominal MRI. 3. incidental findings, including: Aortic atherosclerosis (icd10-i70.0). Coronary artery atherosclerosis. Electronically signed by: Rockey Kilts MD 01/21/2024 01:31 PM EDT RP Workstation: [id number]    ASSESSMENT & PLAN:   DLBCL (diffuse large B cell lymphoma) (HCC) #  DLBCL- NON-GCB sub type- STAGE IV- JULY 2025- 12/07/2023- PET scan-   Hypermetabolic right hilar and subcarinal lymphadenopathy consistent with lymphoma. Hypermetabolic disease involving the left iliac bone and adjacent iliopsoas and gluteus musculature; Hypermetabolic disease involving the bony anatomy of the pelvis and proximal femurs bilaterally, proximal left femoral diaphysis, left-sided ribs, lumbar spine, and sacral spine as well as parasacral soft tissues.   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   KI-67 IS ESSENTIALLY 100%. TO EXCLUDE A SO-CALLED HIGH-GRADE B-CELL   LYMPHOMA/DOUBLE HIT LYMPHOMA FISH IS-negative for MYC gene rearrangement [however MYC gain/trisomy noted; IgH gene rearrangement noted but atypical].   hepatitis panel NEG. AUG 19th, 2025-  MUGA scan- left ventricular ejection fraction equals 53.5 %. 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 # PET2 scan: sep 23rd, 2025- . Marked, near complete response to therapy of lymphoma. The only residual active disease is centered about the proximal right main stem bronchus, as evidenced by soft tissue thickening and hypermetabolism. dauville score 4.   # proceed with cycle # 4  POLA R-CHP .  Labs-CBC/chemistries were reviewed with the patient.   #  Diarrhea- G-2 sec to chemo- continue prn immoiudm/lomitil prn.   # ID prophylaxis-recommend acyclovir  400 twice daily; Bactrim  Monday Wednesday Friday.  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   #Incidental findings on Imaging  PET- CT , 2025:  Similar size of a 1.5 cm left renal lesion which measures greater than fluiddensity. Complex cyst is favored over a solid neoplasm, given lack of significant activity-  pre and post-contrast abdominal MRI- will plan down the line- I reviewed/discussed/counseled the patient.    # IV access: s/p Mediport placement.    Cancer, metastatic to bone [facility]) # Lower back pain-secondary to presumed malignancy involving the L2/sacrum--again discussed importance of walking with support given involvement of the femur. Stable. continue Percocet prn.   Hypokalemia Potassium chloride 20 mEq daily for 7 days.  Prescription sent to pharmacy.   No orders of the defined types were placed in this encounter.  Follow-up in 3 weeks with Dr. B for cycle 5 treatment All questions were answered. The  patient knows to call the clinic with any problems, questions or concerns.  Zelphia Cap, MD, PhD [facility] Oncology 02/18/2024

## 2024-02-18 NOTE — Assessment & Plan Note (Signed)
#    DLBCL- NON-GCB sub type- STAGE IV- JULY 2025- 12/07/2023- PET scan-   Hypermetabolic right hilar and subcarinal lymphadenopathy consistent with lymphoma. Hypermetabolic disease involving the left iliac bone and adjacent iliopsoas and gluteus musculature; Hypermetabolic disease involving the bony anatomy of the pelvis and proximal femurs bilaterally, proximal left femoral diaphysis, left-sided ribs, lumbar spine, and sacral spine as well as parasacral soft tissues.   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   KI-67 IS ESSENTIALLY 100%. 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   hepatitis panel NEG. AUG 19th, 2025-  MUGA scan- left ventricular ejection fraction equals 53.5 %. 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 # PET2 scan: sep 23rd, 2025- . Marked, near complete response to therapy of lymphoma. The only residual active disease is centered about the proximal right main stem bronchus, as evidenced by soft tissue thickening and hypermetabolism. dauville score 4.   # proceed with cycle # 4  POLA R-CHP .  Labs-CBC/chemistries were reviewed with the patient.   #  Diarrhea- G-2 sec to chemo- continue prn immoiudm/lomitil prn.   # ID prophylaxis-recommend acyclovir  400 twice daily; Bactrim  Monday Wednesday Friday.  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   #Incidental findings on Imaging   PET- CT , 2025:  Similar size of a 1.5 cm left renal lesion which measures greater than fluiddensity. Complex cyst is favored over a solid neoplasm, given lack of significant activity-  pre and post-contrast abdominal MRI- will plan down the line- I reviewed/discussed/counseled the patient.    # IV access: s/p Mediport placement.

## 2024-02-18 NOTE — Progress Notes (Signed)
 Nutrition Follow-up:  Patient with diffuse large b-cell lymphoma.  Receiving POLA-R-CHP.  Met with patient during infusion with wife.  Taste continues to be a challenge for patient.  He has been able to taste corn dog, spaghetti, pizza, cheerios, waffle, egg, milkshake, fruit, pudding.  Often times will take a few bites and unable to eat more due to taste.      Medications: reviewed  Labs: reviewed  Anthropometrics:   Weight 181 lb 3.2 oz  188 lb 1.6 on 10/2 189 lb 8 oz on 9/11 190 lb 14.7 oz on 8/14 219 lb 9.6 oz on 10/08/23  5% weight loss in the last 2 months, concerning 3% weight loss in the last 3 weeks  NUTRITION DIAGNOSIS: Inadequate oral intake continues     INTERVENTION:  Discussed foods/flavors that are working and how to apply to other foods.  Encouraged snacking q 1-2 hours. Have snacks sitting by chair and/or in visible location as reminder to eat.  Eat by the clock.    MONITORING, EVALUATION, GOAL: weight trends, intake   NEXT VISIT: Thursday, Nov 13 during infusion  Laekyn Rayos B. Dasie SOLON, CSO, LDN Registered Dietitian [phone]

## 2024-02-18 NOTE — Assessment & Plan Note (Signed)
 Potassium chloride 20 mEq daily for 7 days.  Prescription sent to pharmacy.

## 2024-02-18 NOTE — Assessment & Plan Note (Addendum)
#   Lower back pain-secondary to presumed malignancy involving the L2/sacrum--again discussed importance of walking with support given involvement of the femur. Stable. continue Percocet prn.

## 2024-02-19 ENCOUNTER — Inpatient Hospital Stay

## 2024-02-19 ENCOUNTER — Telehealth: Payer: Self-pay | Admitting: *Deleted

## 2024-02-19 ENCOUNTER — Other Ambulatory Visit: Payer: Self-pay

## 2024-02-19 DIAGNOSIS — Z5112 Encounter for antineoplastic immunotherapy: Secondary | ICD-10-CM | POA: Diagnosis not present

## 2024-02-19 DIAGNOSIS — C8333 Diffuse large B-cell lymphoma, intra-abdominal lymph nodes: Secondary | ICD-10-CM

## 2024-02-19 MED ORDER — PEGFILGRASTIM-JMDB 6 MG/0.6ML ~~LOC~~ SOSY
6.0000 mg | PREFILLED_SYRINGE | Freq: Once | SUBCUTANEOUS | Status: AC
  Administered 2024-02-19: 6 mg via SUBCUTANEOUS
  Filled 2024-02-19: qty 0.6

## 2024-02-19 NOTE — Telephone Encounter (Signed)
 Pt in clinic for his injection today; Discussed additional appointment added to next chemo regime in 3 weeks. He will have his regular chemo on Thurs 11/13 and new appt added on Fri 03/11/24 for IT Methotrexate. Instructions on time and place~ [facility]rive at 9:30 am at [facility]. Procedure is scheduled in Specials.

## 2024-02-26 ENCOUNTER — Encounter: Payer: Self-pay | Admitting: Internal Medicine

## 2024-02-26 ENCOUNTER — Inpatient Hospital Stay

## 2024-02-26 ENCOUNTER — Emergency Department

## 2024-02-26 ENCOUNTER — Inpatient Hospital Stay (HOSPITAL_BASED_OUTPATIENT_CLINIC_OR_DEPARTMENT_OTHER): Admitting: Hospice and Palliative Medicine

## 2024-02-26 ENCOUNTER — Encounter: Payer: Self-pay | Admitting: Emergency Medicine

## 2024-02-26 ENCOUNTER — Other Ambulatory Visit: Payer: Self-pay

## 2024-02-26 ENCOUNTER — Inpatient Hospital Stay
Admission: EM | Admit: 2024-02-26 | Discharge: 2024-02-29 | DRG: 871 | Disposition: A | Discharge status: Home / Self Care | Attending: Internal Medicine | Admitting: Internal Medicine

## 2024-02-26 ENCOUNTER — Telehealth: Payer: Self-pay

## 2024-02-26 VITALS — BP 61/44 | HR 136 | Temp 100.5°F

## 2024-02-26 DIAGNOSIS — Z9221 Personal history of antineoplastic chemotherapy: Secondary | ICD-10-CM | POA: Diagnosis not present

## 2024-02-26 DIAGNOSIS — R5081 Fever presenting with conditions classified elsewhere: Secondary | ICD-10-CM | POA: Diagnosis present

## 2024-02-26 DIAGNOSIS — E7800 Pure hypercholesterolemia, unspecified: Secondary | ICD-10-CM | POA: Diagnosis present

## 2024-02-26 DIAGNOSIS — R296 Repeated falls: Secondary | ICD-10-CM

## 2024-02-26 DIAGNOSIS — C7951 Secondary malignant neoplasm of bone: Secondary | ICD-10-CM | POA: Diagnosis present

## 2024-02-26 DIAGNOSIS — G894 Chronic pain syndrome: Secondary | ICD-10-CM | POA: Diagnosis present

## 2024-02-26 DIAGNOSIS — C83398 Diffuse large b-cell lymphoma of other extranodal and solid organ sites: Secondary | ICD-10-CM | POA: Diagnosis present

## 2024-02-26 DIAGNOSIS — A419 Sepsis, unspecified organism: Secondary | ICD-10-CM | POA: Diagnosis present

## 2024-02-26 DIAGNOSIS — R5381 Other malaise: Secondary | ICD-10-CM | POA: Diagnosis present

## 2024-02-26 DIAGNOSIS — D630 Anemia in neoplastic disease: Secondary | ICD-10-CM | POA: Diagnosis present

## 2024-02-26 DIAGNOSIS — Z83438 Family history of other disorder of lipoprotein metabolism and other lipidemia: Secondary | ICD-10-CM

## 2024-02-26 DIAGNOSIS — Z860100 Personal history of colon polyps, unspecified: Secondary | ICD-10-CM | POA: Diagnosis not present

## 2024-02-26 DIAGNOSIS — C8333 Diffuse large B-cell lymphoma, intra-abdominal lymph nodes: Secondary | ICD-10-CM | POA: Diagnosis not present

## 2024-02-26 DIAGNOSIS — N400 Enlarged prostate without lower urinary tract symptoms: Secondary | ICD-10-CM | POA: Diagnosis present

## 2024-02-26 DIAGNOSIS — Z5112 Encounter for antineoplastic immunotherapy: Secondary | ICD-10-CM | POA: Diagnosis not present

## 2024-02-26 DIAGNOSIS — Z85828 Personal history of other malignant neoplasm of skin: Secondary | ICD-10-CM

## 2024-02-26 DIAGNOSIS — J9809 Other diseases of bronchus, not elsewhere classified: Secondary | ICD-10-CM | POA: Diagnosis present

## 2024-02-26 DIAGNOSIS — R053 Chronic cough: Secondary | ICD-10-CM

## 2024-02-26 DIAGNOSIS — R531 Weakness: Secondary | ICD-10-CM

## 2024-02-26 DIAGNOSIS — D709 Neutropenia, unspecified: Secondary | ICD-10-CM | POA: Diagnosis present

## 2024-02-26 DIAGNOSIS — R6520 Severe sepsis without septic shock: Secondary | ICD-10-CM | POA: Diagnosis present

## 2024-02-26 DIAGNOSIS — K219 Gastro-esophageal reflux disease without esophagitis: Secondary | ICD-10-CM | POA: Diagnosis present

## 2024-02-26 DIAGNOSIS — I10 Essential (primary) hypertension: Secondary | ICD-10-CM | POA: Diagnosis present

## 2024-02-26 DIAGNOSIS — R059 Cough, unspecified: Secondary | ICD-10-CM | POA: Diagnosis present

## 2024-02-26 DIAGNOSIS — G893 Neoplasm related pain (acute) (chronic): Secondary | ICD-10-CM | POA: Diagnosis present

## 2024-02-26 DIAGNOSIS — D72819 Decreased white blood cell count, unspecified: Secondary | ICD-10-CM

## 2024-02-26 DIAGNOSIS — Z8249 Family history of ischemic heart disease and other diseases of the circulatory system: Secondary | ICD-10-CM | POA: Diagnosis not present

## 2024-02-26 DIAGNOSIS — Z1152 Encounter for screening for COVID-19: Secondary | ICD-10-CM | POA: Diagnosis not present

## 2024-02-26 DIAGNOSIS — D61810 Antineoplastic chemotherapy induced pancytopenia: Secondary | ICD-10-CM | POA: Diagnosis present

## 2024-02-26 DIAGNOSIS — R300 Dysuria: Secondary | ICD-10-CM | POA: Diagnosis present

## 2024-02-26 DIAGNOSIS — Z8261 Family history of arthritis: Secondary | ICD-10-CM | POA: Diagnosis not present

## 2024-02-26 DIAGNOSIS — C8330 Diffuse large B-cell lymphoma, unspecified site: Secondary | ICD-10-CM | POA: Diagnosis present

## 2024-02-26 DIAGNOSIS — Z833 Family history of diabetes mellitus: Secondary | ICD-10-CM | POA: Diagnosis not present

## 2024-02-26 DIAGNOSIS — Z885 Allergy status to narcotic agent status: Secondary | ICD-10-CM

## 2024-02-26 DIAGNOSIS — E871 Hypo-osmolality and hyponatremia: Secondary | ICD-10-CM | POA: Diagnosis present

## 2024-02-26 DIAGNOSIS — D61818 Other pancytopenia: Secondary | ICD-10-CM

## 2024-02-26 LAB — CBC WITH DIFFERENTIAL (CANCER CENTER ONLY)
Abs Immature Granulocytes: 0.2 K/uL — ABNORMAL HIGH (ref 0.00–0.07)
Basophils Absolute: 0 K/uL (ref 0.0–0.1)
Basophils Relative: 4 %
Eosinophils Absolute: 0 K/uL (ref 0.0–0.5)
Eosinophils Relative: 0 %
HCT: 26 % — ABNORMAL LOW (ref 39.0–52.0)
Hemoglobin: 9 g/dL — ABNORMAL LOW (ref 13.0–17.0)
Immature Granulocytes: 36 %
Lymphocytes Relative: 17 %
Lymphs Abs: 0.1 K/uL — ABNORMAL LOW (ref 0.7–4.0)
MCH: 33 pg (ref 26.0–34.0)
MCHC: 34.6 g/dL (ref 30.0–36.0)
MCV: 95.2 fL (ref 80.0–100.0)
Monocytes Absolute: 0.1 K/uL (ref 0.1–1.0)
Monocytes Relative: 11 %
Neutro Abs: 0.2 K/uL — CL (ref 1.7–7.7)
Neutrophils Relative %: 32 %
Platelet Count: 95 K/uL — ABNORMAL LOW (ref 150–400)
RBC: 2.73 MIL/uL — ABNORMAL LOW (ref 4.22–5.81)
RDW: 15.5 % (ref 11.5–15.5)
WBC Count: 0.5 K/uL — CL (ref 4.0–10.5)
nRBC: 0 % (ref 0.0–0.2)

## 2024-02-26 LAB — LACTIC ACID, PLASMA
Lactic Acid, Venous: 3.4 mmol/L (ref 0.5–1.9)
Lactic Acid, Venous: 1.7 mmol/L (ref 0.5–1.9)

## 2024-02-26 LAB — COMPREHENSIVE METABOLIC PANEL WITH GFR
ALT: 69 U/L — ABNORMAL HIGH (ref 0–44)
AST: 36 U/L (ref 15–41)
Albumin: 3.3 g/dL — ABNORMAL LOW (ref 3.5–5.0)
Alkaline Phosphatase: 66 U/L (ref 38–126)
Anion gap: 12 (ref 5–15)
BUN: 16 mg/dL (ref 8–23)
CO2: 21 mmol/L — ABNORMAL LOW (ref 22–32)
Calcium: 7.8 mg/dL — ABNORMAL LOW (ref 8.9–10.3)
Chloride: 93 mmol/L — ABNORMAL LOW (ref 98–111)
Creatinine, Ser: 1.09 mg/dL (ref 0.61–1.24)
GFR, Estimated: >60 mL/min (ref >60)
Glucose, Bld: 143 mg/dL — ABNORMAL HIGH (ref 70–99)
Potassium: 3.9 mmol/L (ref 3.5–5.1)
Sodium: 126 mmol/L — ABNORMAL LOW (ref 135–145)
Total Bilirubin: 2.5 mg/dL — ABNORMAL HIGH (ref 0.0–1.2)
Total Protein: 6.7 g/dL (ref 6.5–8.1)

## 2024-02-26 LAB — URINALYSIS, W/ REFLEX TO CULTURE (INFECTION SUSPECTED)
Bacteria, UA: NONE SEEN
Bilirubin Urine: NEGATIVE
Glucose, UA: NEGATIVE mg/dL
Hgb urine dipstick: NEGATIVE
Ketones, ur: NEGATIVE mg/dL
Leukocytes,Ua: NEGATIVE
Nitrite: NEGATIVE
Protein, ur: NEGATIVE mg/dL
Specific Gravity, Urine: 1.03 (ref 1.005–1.030)
Squamous Epithelial / HPF: 0 /HPF (ref 0–5)
pH: 5 (ref 5.0–8.0)

## 2024-02-26 LAB — CMP (CANCER CENTER ONLY)
ALT: 66 U/L — ABNORMAL HIGH (ref 0–44)
AST: 36 U/L (ref 15–41)
Albumin: 3.1 g/dL — ABNORMAL LOW (ref 3.5–5.0)
Alkaline Phosphatase: 70 U/L (ref 38–126)
Anion gap: 11 (ref 5–15)
BUN: 16 mg/dL (ref 8–23)
CO2: 22 mmol/L (ref 22–32)
Calcium: 7.8 mg/dL — ABNORMAL LOW (ref 8.9–10.3)
Chloride: 93 mmol/L — ABNORMAL LOW (ref 98–111)
Creatinine: 0.95 mg/dL (ref 0.61–1.24)
GFR, Estimated: >60 mL/min (ref >60)
Glucose, Bld: 129 mg/dL — ABNORMAL HIGH (ref 70–99)
Potassium: 3.9 mmol/L (ref 3.5–5.1)
Sodium: 126 mmol/L — ABNORMAL LOW (ref 135–145)
Total Bilirubin: 2.4 mg/dL — ABNORMAL HIGH (ref 0.0–1.2)
Total Protein: 6.4 g/dL — ABNORMAL LOW (ref 6.5–8.1)

## 2024-02-26 LAB — CBC WITH DIFFERENTIAL/PLATELET
Abs Immature Granulocytes: 0 K/uL (ref 0.00–0.07)
Basophils Absolute: 0 K/uL (ref 0.0–0.1)
Basophils Relative: 0 %
Eosinophils Absolute: 0 K/uL (ref 0.0–0.5)
Eosinophils Relative: 0 %
HCT: 27.4 % — ABNORMAL LOW (ref 39.0–52.0)
Hemoglobin: 9.4 g/dL — ABNORMAL LOW (ref 13.0–17.0)
Immature Granulocytes: 0 %
Lymphocytes Relative: 42 %
Lymphs Abs: 0.1 K/uL — ABNORMAL LOW (ref 0.7–4.0)
MCH: 33.2 pg (ref 26.0–34.0)
MCHC: 34.3 g/dL (ref 30.0–36.0)
MCV: 96.8 fL (ref 80.0–100.0)
Monocytes Absolute: 0.1 K/uL (ref 0.1–1.0)
Monocytes Relative: 39 %
Neutro Abs: 0.1 K/uL — CL (ref 1.7–7.7)
Neutrophils Relative %: 19 %
Platelets: 79 K/uL — ABNORMAL LOW (ref 150–400)
RBC: 2.83 MIL/uL — ABNORMAL LOW (ref 4.22–5.81)
RDW: 15.3 % (ref 11.5–15.5)
WBC: 0.3 K/uL — CL (ref 4.0–10.5)
nRBC: 0 % (ref 0.0–0.2)

## 2024-02-26 LAB — LIPASE, BLOOD: Lipase: 17 U/L (ref 11–51)

## 2024-02-26 LAB — RESP PANEL BY RT-PCR (RSV, FLU A&B, COVID)  RVPGX2
Influenza A by PCR: NEGATIVE
Influenza B by PCR: NEGATIVE
Resp Syncytial Virus by PCR: NEGATIVE
SARS Coronavirus 2 by RT PCR: NEGATIVE

## 2024-02-26 LAB — MAGNESIUM: Magnesium: 1.8 mg/dL (ref 1.7–2.4)

## 2024-02-26 LAB — PROTIME-INR
INR: 1 (ref 0.8–1.2)
Prothrombin Time: 13.8 s (ref 11.4–15.2)

## 2024-02-26 MED ORDER — METRONIDAZOLE 500 MG/100ML IV SOLN
500.0000 mg | Freq: Two times a day (BID) | INTRAVENOUS | Status: DC
  Administered 2024-02-26 – 2024-02-29 (×6): 500 mg via INTRAVENOUS
  Filled 2024-02-26 (×7): qty 100

## 2024-02-26 MED ORDER — VANCOMYCIN HCL IN DEXTROSE 1-5 GM/200ML-% IV SOLN: 1000.0000 mg | Freq: Once | INTRAVENOUS | Status: DC

## 2024-02-26 MED ORDER — ACETAMINOPHEN 650 MG RE SUPP: 650.0000 mg | Freq: Four times a day (QID) | RECTAL | Status: DC | PRN

## 2024-02-26 MED ORDER — ACETAMINOPHEN 325 MG RE SUPP
RECTAL | Status: AC
  Administered 2024-02-26: 650 mg via RECTAL
  Filled 2024-02-26: qty 2

## 2024-02-26 MED ORDER — METRONIDAZOLE 500 MG/100ML IV SOLN
500.0000 mg | Freq: Once | INTRAVENOUS | Status: DC
  Filled 2024-02-26: qty 100

## 2024-02-26 MED ORDER — SODIUM CHLORIDE 0.9 % IV SOLN
2.0000 g | Freq: Once | INTRAVENOUS | Status: AC
  Administered 2024-02-26: 2 g via INTRAVENOUS
  Filled 2024-02-26: qty 12.5

## 2024-02-26 MED ORDER — BENZONATATE 100 MG PO CAPS
100.0000 mg | ORAL_CAPSULE | Freq: Two times a day (BID) | ORAL | Status: DC | PRN
  Administered 2024-02-27: 100 mg via ORAL
  Filled 2024-02-26: qty 1

## 2024-02-26 MED ORDER — SODIUM CHLORIDE 0.9 % IV SOLN
2.0000 g | Freq: Three times a day (TID) | INTRAVENOUS | Status: DC
  Administered 2024-02-26 – 2024-02-29 (×8): 2 g via INTRAVENOUS
  Filled 2024-02-26 (×9): qty 12.5

## 2024-02-26 MED ORDER — ACETAMINOPHEN 325 MG PO TABS: 650.0000 mg | ORAL_TABLET | Freq: Four times a day (QID) | ORAL | Status: DC | PRN

## 2024-02-26 MED ORDER — IOHEXOL 300 MG/ML  SOLN
100.0000 mL | Freq: Once | INTRAMUSCULAR | Status: AC | PRN
Start: 2024-02-26 — End: 2024-02-26
  Administered 2024-02-26: 100 mL via INTRAVENOUS

## 2024-02-26 MED ORDER — LACTATED RINGERS IV BOLUS
1000.0000 mL | Freq: Once | INTRAVENOUS | Status: AC
  Administered 2024-02-26: 1000 mL via INTRAVENOUS

## 2024-02-26 MED ORDER — VANCOMYCIN HCL 2000 MG/400ML IV SOLN
2000.0000 mg | Freq: Once | INTRAVENOUS | Status: AC
  Administered 2024-02-26: 2000 mg via INTRAVENOUS
  Filled 2024-02-26: qty 400

## 2024-02-26 MED ORDER — ACETAMINOPHEN 325 MG RE SUPP: 650.0000 mg | Freq: Once | RECTAL | Status: AC

## 2024-02-26 MED ORDER — HYDROCOD POLI-CHLORPHE POLI ER 10-8 MG/5ML PO SUER
5.0000 mL | Freq: Every evening | ORAL | Status: AC | PRN
  Administered 2024-02-27 (×2): 5 mL via ORAL
  Filled 2024-02-26 (×2): qty 5

## 2024-02-26 MED ORDER — LACTATED RINGERS IV SOLN
INTRAVENOUS | Status: AC
Start: 2024-02-26 — End: 2024-02-27

## 2024-02-26 MED ORDER — ONDANSETRON HCL 4 MG/2ML IJ SOLN: 4.0000 mg | Freq: Four times a day (QID) | INTRAMUSCULAR | Status: DC | PRN

## 2024-02-26 MED ORDER — SODIUM CHLORIDE 0.9 % IV BOLUS (SEPSIS)
1000.0000 mL | Freq: Once | INTRAVENOUS | Status: AC
  Administered 2024-02-26: 1000 mL via INTRAVENOUS

## 2024-02-26 MED ORDER — ONDANSETRON HCL 4 MG PO TABS: 4.0000 mg | ORAL_TABLET | Freq: Four times a day (QID) | ORAL | Status: DC | PRN

## 2024-02-26 MED ORDER — HYDRALAZINE HCL 20 MG/ML IJ SOLN: 5.0000 mg | Freq: Four times a day (QID) | INTRAMUSCULAR | Status: DC | PRN

## 2024-02-26 NOTE — Assessment & Plan Note (Signed)
Continue outpatient follow up with medical oncology

## 2024-02-26 NOTE — Progress Notes (Signed)
 Symptom Management Clinic [facility] at [facility] [phone] [phone]) [phone]  Patient Care Team: Myrla Jon HERO, MD as PCP - General (Family Medicine) Verdene Gills, RN as Oncology Nurse Navigator [name], MD as Consulting Physician (Oncology)   NAME OF PATIENT: Ruben Garcia  [id number]  February 25, 1946   DATE OF VISIT: 02/26/24  REASON FOR CONSULT: Ruben Garcia is a 78 y.o. male with multiple medical problems including stage IV diffuse large B-cell lymphoma.   INTERVAL HISTORY: Patient on treatment [facility].  He received day 1, cycle 4 on 02/18/2024.  Patient presented to [facility] for evaluation of several days of cough, congestion, and weakness.   However, upon arrival to clinic, he was noted to have AMS, fever, and hypotension with BP 61/44.   PAST MEDICAL HISTORY: Past Medical History:  Diagnosis Date   Allergic rhinitis, cause unspecified    Basal cell carcinoma of skin, site unspecified 04/28/2010   Nasal; [place].   BPH (benign prostatic hypertrophy)    Colon polyps    Essential hypertension, benign    Hemorrhoids, internal    Incomplete bladder emptying    Other abnormal glucose    Over weight    Personal history of colonic polyps    Personal history of other genital system and obstetric disorders(V13.29)    Pure hypercholesterolemia    Unspecified disorder of kidney and ureter    Unspecified disorder of liver     PAST SURGICAL HISTORY:  Past Surgical History:  Procedure Laterality Date   BASAL CELL CARCINOMA EXCISION  2012   Facial        [place]   COLONOSCOPY  11/13/2010   single polyp; repeatin 5 years; [place]   COLONOSCOPY N/A 11/04/2021   Procedure: COLONOSCOPY;  Surgeon: Maryruth Ole DASEN, MD;  Location: [facility] ENDOSCOPY;  Service: Endoscopy;  Laterality: N/A;  REQUEST EARLY TIME   COLONOSCOPY WITH PROPOFOL  N/A 12/13/2015   Procedure: COLONOSCOPY WITH PROPOFOL ;  Surgeon: Lamar DASEN Viktoria,  MD;  Location: [facility] ENDOSCOPY;  Service: Endoscopy;  Laterality: N/A;   ear surgery  05/2012   [name].  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  Benign.   HEMORRHOID SURGERY  1978   [name]   IR IMAGING GUIDED PORT INSERTION  12/04/2023   LAPAROSCOPIC CHOLECYSTECTOMY  1982   rotator cuff surgery Right 05/14/2016   VASECTOMY  1978    HEMATOLOGY/ONCOLOGY HISTORY:  Oncology History Overview Note    #  STAGE IV- JULY 2025- CT chest- Right hilar mass/mediastinal adenopathy.  MRI lumbar spine shows-L2 lesion.  MRI of the pelvis- Substantial metastatic burden to the bilateral iliac bones, right ischium, bilateral posterior acetabular walls, left pubic bone, central and right sacrum, bilateral femoral neck, and bilateral intertrochanteric region. In particular, tumor in the proximal femurs  Extraosseous extension of tumor from the dominant left iliac lesion, tracking along the left iliopsoas and upper pelvic sidewall; posteriorly along the gluteus minimus and gluteus medius, and even posteriorly along the lower paraspinal and medial left gluteus maximus musculature; Extraosseous extension of tumor from the right iliac lesion, tracking along the right iliopsoas and right gluteus minimus. Tumor along the attachment site of the right rectus femoris muscle, which could predispose to avulsion. Scattered small pelvic lymph nodes; Mildly prominent median lobe of prostate gland indents the bladder base.  12/07/2023- PET scan-   Hypermetabolic right hilar and subcarinal lymphadenopathy consistent with lymphoma. Hypermetabolic disease involving the left iliac bone and adjacent iliopsoas and gluteus musculature; Hypermetabolic disease involving the  bony anatomy of the pelvis and proximal femurs bilaterally, proximal left femoral diaphysis, left-sided ribs, lumbar spine, and sacral spine as well as parasacral soft tissues.  # JULY 2025- Soft tissue, biopsy, left gluteal soft tissue mass 7.5 cm: MORPHOLOGICALLY CONSISTENT WITH DIFFUSE LARGE  B-CELL LYMPHOMA - THE CELLS OF   INTEREST ARE B CELLS (CD20/CD45 POSITIVE WHICH COEXPRESS MUM1 AND BCL6, BUT ARE NEGATIVE FOR CD10.   KI-67 IS ESSENTIALLY 100%. TO EXCLUDE A SO-CALLED HIGH-GRADE B-CELL  LYMPHOMA/DOUBLE HIT LYMPHOMA FISH IS PENDING.   MUGA SCAN- 8/19-  hepatitis panel_NEG.     # AUG 14th, 2025-  rituximab  today-pending MUGA scan.   # AUG 21st, 2025- cycle #1 of chemotherapy POLA R-CHP    # Elevated AST ALT-question etiology.   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   DLBCL (diffuse large B cell lymphoma) (HCC)  12/01/2023 Initial Diagnosis   DLBCL (diffuse large B cell lymphoma) (HCC)   12/01/2023 Cancer Staging   Staging form: Hodgkin and Non-Hodgkin Lymphoma, AJCC 8th Edition - Clinical: Stage IV (Diffuse large B-cell lymphoma) - Signed by Brahmanday, Govinda R, MD on 12/01/2023   12/10/2023 -  Chemotherapy   Patient is on Treatment Plan : NON-HODGKINS LYMPHOMA POLA-R-CHP D1 X 6 cycles / Rituximab  D1 q21d X 2 cycles       ALLERGIES:  is allergic to codeine.  MEDICATIONS:  Current Outpatient Medications  Medication Sig Dispense Refill   acetaminophen  (TYLENOL ) 325 MG tablet Take 2 tablets (650 mg total) by mouth every 6 (six) hours as needed for mild pain (pain score 1-3), fever, headache or moderate pain (pain score 4-6) (or Fever >/= 101).     acyclovir  (ZOVIRAX ) 400 MG tablet Take 1 tablet (400 mg total) by mouth 2 (two) times daily. [to prevent shingles] 60 tablet 3   albuterol  (VENTOLIN  HFA) 108 (90 Base) MCG/ACT inhaler Inhale 1-2 puffs into the lungs every 6 (six) hours as needed. 18 g 0   allopurinol  (ZYLOPRIM ) 300 MG tablet Take 1 tablet (300 mg total) by mouth 2 (two) times daily. 120 tablet 0   benzonatate  (TESSALON ) 100 MG capsule Take 1-2 capsules (100-200 mg total) by mouth 3 (three) times daily as needed for cough. 90 capsule 0   chlorpheniramine-HYDROcodone  (TUSSIONEX) 10-8 MG/5ML  Take 5 mLs by mouth at bedtime as needed for cough. 140 mL 0   cyclobenzaprine  (FLEXERIL ) 10 MG tablet Take 1 tablet (10 mg total) by mouth 3 (three) times daily as needed for muscle spasms. 30 tablet 2   diphenoxylate-atropine (LOMOTIL) 2.5-0.025 MG tablet Take 1 tablet by mouth 4 (four) times daily as needed for diarrhea or loose stools. 45 tablet 1   lactulose  (CHRONULAC ) 10 GM/15ML solution Take 15 mLs (10 g total) by mouth daily as needed for mild constipation. 236 mL 0   lidocaine -prilocaine  (EMLA ) cream Apply 1 Application topically as needed. Apply to port and cover with saran wrap 1-2 hours prior to port access 30 g 1   nystatin  (MYCOSTATIN ) 100000 UNIT/ML suspension Take 5 mLs (500,000 Units total) by mouth 4 (four) times daily. 473 mL 0   ondansetron  (ZOFRAN ) 4 MG tablet Take 1 tablet (4 mg total) by mouth every 8 (eight) hours as needed for nausea or vomiting. 30 tablet 0   pantoprazole  (PROTONIX ) 20 MG tablet Take 1 tablet (20 mg total) by mouth daily. 30 tablet 0   potassium chloride SA (KLOR-CON M) 20 MEQ  tablet Take 1 tablet (20 mEq total) by mouth daily. 7 tablet 0   predniSONE  (DELTASONE ) 50 MG tablet Take 2 tablets once day x 5 days. START on day of your chemotherapy. Take in AM; with FOOD. 10 tablet 5   prochlorperazine  (COMPAZINE ) 10 MG tablet Take 1 tablet (10 mg total) by mouth every 6 (six) hours as needed for nausea or vomiting. 30 tablet 0   traMADol  (ULTRAM ) 50 MG tablet Take 1 tablet (50 mg total) by mouth every 6 (six) hours as needed. 30 tablet 0   No current facility-administered medications for this visit.    VITAL SIGNS: There were no vitals taken for this visit. There were no vitals filed for this visit.  Estimated body mass index is 24.58 kg/m as calculated from the following:   Height as of 01/28/24: 6' (1.829 m).   Weight as of 02/18/24: 181 lb 3.2 oz (82.2 kg).  LABS: CBC:    Component Value Date/Time   WBC 8.7 02/18/2024 0834   WBC 10.3 11/24/2023  1103   HGB 10.8 (L) 02/18/2024 0834   HGB 15.9 02/10/2019 0949   HCT 32.2 (L) 02/18/2024 0834   HCT 46.6 02/10/2019 0949   PLT 403 (H) 02/18/2024 0834   PLT 267 02/04/2018 0938   MCV 97.9 02/18/2024 0834   MCV 95 02/10/2019 0949   NEUTROABS 6.0 02/18/2024 0834   NEUTROABS 4.4 02/10/2019 0949   LYMPHSABS 1.2 02/18/2024 0834   LYMPHSABS 2.4 02/10/2019 0949   MONOABS 0.8 02/18/2024 0834   EOSABS 0.0 02/18/2024 0834   EOSABS 0.1 02/10/2019 0949   BASOSABS 0.1 02/18/2024 0834   BASOSABS 0.1 02/10/2019 0949   Comprehensive Metabolic Panel:    Component Value Date/Time   NA 136 02/18/2024 0834   NA 135 11/12/2023 0900   K 3.3 (L) 02/18/2024 0834   CL 102 02/18/2024 0834   CO2 25 02/18/2024 0834   BUN 7 (L) 02/18/2024 0834   BUN 20 11/12/2023 0900   CREATININE 0.94 02/18/2024 0834   CREATININE 1.11 02/03/2017 0952   GLUCOSE 111 (H) 02/18/2024 0834   CALCIUM  8.9 02/18/2024 0834   AST 30 02/18/2024 0834   ALT 24 02/18/2024 0834   ALKPHOS 64 02/18/2024 0834   BILITOT 0.9 02/18/2024 0834   PROT 6.8 02/18/2024 0834   PROT 7.2 11/04/2023 0846   ALBUMIN 3.7 02/18/2024 0834   ALBUMIN 4.1 11/04/2023 0846    RADIOGRAPHIC STUDIES: No results found.  PERFORMANCE STATUS (ECOG) : 2 - Symptomatic, <50% confined to bed  Review of Systems Unless otherwise noted, a complete review of systems is negative.  Physical Exam General: critically ill appearing Cardiovascular: tachycardic Pulmonary: clear ant fields Extremities: no edema, no joint deformities Skin: no rashes Neurological: Lethargic, poorly responsive  IMPRESSION/PLAN: DLBCL -on Pola R CHP  Neutropenic fever -ANC 0.2.  Temp 100.5. Patient hypotensive (61/44) and poorly responsive at time of arrival to clinic.  He was taken urgently and immediately to emergency department for further evaluation and management of presumed sepsis.  Report given to ED staff.  Patient accompanied by wife.  Case and plan discussed with Dr.  Melanee  Thank you for allowing me to participate in the care of this very pleasant patient.   Time Total: 20 minutes  Visit consisted of counseling and education dealing with the complex and emotionally intense issues of symptom management in the setting of serious illness.Greater than 50%  of this time was spent counseling and coordinating care related to the above  assessment and plan.  Signed by: Fonda Mower, PhD, NP-C

## 2024-02-26 NOTE — Consult Note (Signed)
 CODE SEPSIS - PHARMACY COMMUNICATION  **Broad Spectrum Antibiotics should be administered within 1 hour of Sepsis diagnosis**  Time Code Sepsis Called/Page Received: 1341  Antibiotics Ordered: cefepime, vancomycin, and Flagyl  Time of 1st antibiotic administration: 1434  Additional action taken by pharmacy: N/A   Kayla JULIANNA Blew ,PharmD Clinical Pharmacist  02/26/2024  1:44 PM

## 2024-02-26 NOTE — H&P (Signed)
 History and Physical   Ruben Garcia FMW:[id number] DOB: 1946/01/28 DOA: 02/26/2024  PCP: Myrla Jon HERO, MD  Patient coming from: Outpatient cancer center  I have personally briefly reviewed patient's old medical records in [facility] EMR.  Chief Concern: Low blood pressure  HPI: Ruben Garcia is a 78 year old male with history of diffuse large B-cell lymphoma, stage IV, GERD, history of hypertension, who presents ED for chief concerns of weakness, fever.  Vitals in the ED showed Tmax of 100.5, currently improved to 100.2, respiration rate of 20, heart rate 124, improved to 108, blood pressure initially 113/73, SpO2 of 98% on room air.  Per ED documentation, patient's blood pressure at cancer center was 61/44.  ED treatment: Acetaminophen  650 mg p.o. one-time dose, cefepime 2 g IV one-time dose, sodium chloride  1 L bolus, LR 1 L bolus, vancomycin per pharmacy, metronidazole 500 mg IV one-time dose. ----------------------------------------------- At bedside, patient able to tell me his first and last name, age, location, current calendar year.  He reports that he has been coughing a lot since he started chemotherapy.  He denies productive cough.  He denies chest pain, shortness of breath, abdominal pain, blood in his urine or stool.  He denies swelling of the lower extremity, nausea, vomiting, syncope.  He reports dysuria since starting on chemotherapy.  He endorses generalized appetite loss.  Social history: He lives at home with his wife.  He denies tobacco, EtOH, recreational drug use.  He is retired.  ROS: Constitutional: no weight change, no fever ENT/Mouth: no sore throat, no rhinorrhea Eyes: no eye pain, no vision changes Cardiovascular: no chest pain, no dyspnea,  no edema, no palpitations Respiratory: no cough, no sputum, no wheezing Gastrointestinal: no nausea, no vomiting, no diarrhea, no constipation Genitourinary: no urinary incontinence, + dysuria, no  hematuria Musculoskeletal: no arthralgias, no myalgias Skin: no skin lesions, no pruritus, Neuro: + weakness, no loss of consciousness, no syncope Psych: no anxiety, no depression, + decrease appetite Heme/Lymph: no bruising, no bleeding  ED Course: Discussed with EDP, patient requiring hospitalization for chief concerns of meeting criteria for severe sepsis.  Assessment/Plan  Principal Problem:   Severe sepsis with acute organ dysfunction (HCC) Active Problems:   Leukopenia   Essential (primary) hypertension   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)   Chronic pain syndrome   Cancer, metastatic to bone (HCC)   DLBCL (diffuse large B cell lymphoma) (HCC)   Assessment and Plan:  * Severe sepsis with acute organ dysfunction (HCC) Etiology workup in progress Blood cultures x 2 are in process UA has been ordered and pending collection Continue with cefepime and vancomycin per pharmacy, metronidazole 500 mg IV twice daily Maintain MAP greater than 65 Admit to PCU, inpatient  Leukopenia Per medical oncologist, no indication for Neupogen as patient received Neulasta  with chemotherapy infusion on 02/19/2024  Essential (primary) hypertension Currently low normotensive Will not order scheduled antihypertensive medication at this time Hydralazine 5 mg IV every 6 hours as needed for SBP greater than 175, 5 days ordered  Cancer, metastatic to bone [facility]) Continue outpatient follow up with medical oncology  Chronic pain syndrome PDMP reviewed Current active prescription includes hydrocodone -chlorphen suspension, quantity of 120, for 24 days written and filled on 02/08/2024.  Diphenoxylate-atropine 2.5-0.025, 45-day supply, 12 days written and filled on 01/28/2024  Chart reviewed.   DVT prophylaxis: Heparin  Code Status: Full code Diet: Regular diet Family Communication: Updated spouse, Heron, at bedside with patient's permission Disposition Plan: Pending clinical course Consults called:  Pharmacy Admission status: PCU, inpatient  Past Medical History:  Diagnosis Date   Allergic rhinitis, cause unspecified    Basal cell carcinoma of skin, site unspecified 04/28/2010   Nasal; [place].   BPH (benign prostatic hypertrophy)    Colon polyps    Essential hypertension, benign    Hemorrhoids, internal    Incomplete bladder emptying    Other abnormal glucose    Over weight    Personal history of colonic polyps    Personal history of other genital system and obstetric disorders(V13.29)    Pure hypercholesterolemia    Unspecified disorder of kidney and ureter    Unspecified disorder of liver    Past Surgical History:  Procedure Laterality Date   BASAL CELL CARCINOMA EXCISION  2012   Facial        [place]   COLONOSCOPY  11/13/2010   single polyp; repeatin 5 years; [place]   COLONOSCOPY N/A 11/04/2021   Procedure: COLONOSCOPY;  Surgeon: Maryruth Ole DASEN, MD;  Location: [facility] ENDOSCOPY;  Service: Endoscopy;  Laterality: N/A;  REQUEST EARLY TIME   COLONOSCOPY WITH PROPOFOL  N/A 12/13/2015   Procedure: COLONOSCOPY WITH PROPOFOL ;  Surgeon: Lamar DASEN Viktoria, MD;  Location: [facility] ENDOSCOPY;  Service: Endoscopy;  Laterality: N/A;   ear surgery  05/2012   [name].  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  Benign.   HEMORRHOID SURGERY  1978   [name]   IR IMAGING GUIDED PORT INSERTION  12/04/2023   LAPAROSCOPIC CHOLECYSTECTOMY  1982   rotator cuff surgery Right 05/14/2016   VASECTOMY  1978    Social History:  reports that he has never smoked. He has never used smokeless tobacco. He reports that he does not currently use alcohol after a past usage of about 5.0 standard drinks of alcohol per week. He reports that he does not use drugs.  Allergies  Allergen Reactions   Codeine Nausea Only   Family History  Problem Relation Age of Onset   Heart disease Mother        first MI in 21s   Diabetes Mother    Hyperlipidemia Mother    Arthritis Mother        rheumatoid   Cancer Father        lung    Diabetes Father    Arthritis Sister    Hyperlipidemia Sister    Hyperlipidemia Sister    Hypertension Sister    Hyperlipidemia Sister    Kidney disease Neg Hx    Prostate cancer Neg Hx    Colon cancer Neg Hx    Family history: Family history reviewed and not pertinent.  Prior to Admission medications   Medication Sig Start Date End Date Taking? Authorizing Provider  acetaminophen  (TYLENOL ) 325 MG tablet Take 2 tablets (650 mg total) by mouth every 6 (six) hours as needed for mild pain (pain score 1-3), fever, headache or moderate pain (pain score 4-6) (or Fever >/= 101). 11/21/23   Wieting, Richard, MD  acyclovir  (ZOVIRAX ) 400 MG tablet Take 1 tablet (400 mg total) by mouth 2 (two) times daily. [to prevent shingles] 12/17/23   Brahmanday, Govinda R, MD  albuterol  (VENTOLIN  HFA) 108 [phone] Base) MCG/ACT inhaler Inhale 1-2 puffs into the lungs every 6 (six) hours as needed. 09/11/23   Corlis Burnard DEL, NP  allopurinol  (ZYLOPRIM ) 300 MG tablet Take 1 tablet (300 mg total) by mouth 2 (two) times daily. 12/17/23   Brahmanday, Govinda R, MD  benzonatate  (TESSALON ) 100 MG capsule Take 1-2 capsules (100-200 mg total) by mouth  3 (three) times daily as needed for cough. 02/08/24   Dasie Tinnie MATSU, NP  chlorpheniramine-HYDROcodone  (TUSSIONEX) 10-8 MG/5ML Take 5 mLs by mouth at bedtime as needed for cough. 02/08/24   Dasie Tinnie MATSU, NP  cyclobenzaprine  (FLEXERIL ) 10 MG tablet Take 1 tablet (10 mg total) by mouth 3 (three) times daily as needed for muscle spasms. 11/11/23   Gasper Nancyann BRAVO, MD  diphenoxylate-atropine (LOMOTIL) 2.5-0.025 MG tablet Take 1 tablet by mouth 4 (four) times daily as needed for diarrhea or loose stools. 01/28/24   Brahmanday, Govinda R, MD  lactulose  (CHRONULAC ) 10 GM/15ML solution Take 15 mLs (10 g total) by mouth daily as needed for mild constipation. 12/22/23   Brahmanday, Govinda R, MD  lidocaine -prilocaine  (EMLA ) cream Apply 1 Application topically as needed. Apply to port and cover  with saran wrap 1-2 hours prior to port access 12/07/23   Brahmanday, Govinda R, MD  nystatin  (MYCOSTATIN ) 100000 UNIT/ML suspension Take 5 mLs (500,000 Units total) by mouth 4 (four) times daily. 02/18/24   Babara Call, MD  ondansetron  (ZOFRAN ) 4 MG tablet Take 1 tablet (4 mg total) by mouth every 8 (eight) hours as needed for nausea or vomiting. 11/21/23   Josette Ade, MD  pantoprazole  (PROTONIX ) 20 MG tablet Take 1 tablet (20 mg total) by mouth daily. 12/25/23   Brahmanday, Govinda R, MD  potassium chloride SA (KLOR-CON M) 20 MEQ tablet Take 1 tablet (20 mEq total) by mouth daily. 02/18/24   Babara Call, MD  predniSONE  (DELTASONE ) 50 MG tablet Take 2 tablets once day x 5 days. START on day of your chemotherapy. Take in AM; with FOOD. 12/17/23   Brahmanday, Govinda R, MD  prochlorperazine  (COMPAZINE ) 10 MG tablet Take 1 tablet (10 mg total) by mouth every 6 (six) hours as needed for nausea or vomiting. 12/25/23   Brahmanday, Govinda R, MD  traMADol  (ULTRAM ) 50 MG tablet Take 1 tablet (50 mg total) by mouth every 6 (six) hours as needed. 12/31/23   Borders, Fonda SAUNDERS, NP   Physical Exam: Vitals:   02/26/24 1435 02/26/24 1440 02/26/24 1443 02/26/24 1445  BP: 103/64 104/60  103/61  Pulse: (!) 116 (!) 113  (!) 115  Resp: (!) 25 (!) 22  (!) 28  Temp:   100.2 F (37.9 C)   TempSrc:   Oral   SpO2: 94% 96%  95%   Constitutional: appears age-appropriate, weak, frail Eyes: PERRL, lids and conjunctivae normal ENMT: Mucous membranes are moist. Posterior pharynx clear of any exudate or lesions. Age-appropriate dentition. Hearing appropriate Neck: normal, supple, no masses, no thyromegaly Respiratory: clear to auscultation bilaterally, no wheezing, no crackles. Normal respiratory effort. No accessory muscle use.  Cardiovascular: Regular rate and rhythm, no murmurs / rubs / gallops. No extremity edema. 2+ pedal pulses. No carotid bruits.  Abdomen: no tenderness, no masses palpated, no hepatosplenomegaly. Bowel  sounds positive.  Musculoskeletal: no clubbing / cyanosis. No joint deformity upper and lower extremities. Good ROM, no contractures, no atrophy. Normal muscle tone.  Skin: no rashes, lesions, ulcers. No induration Neurologic: Sensation intact. Strength 5/5 in all 4.  Psychiatric: Normal judgment and insight. Alert and oriented x 3. Normal mood.   EKG: independently reviewed, showing sinus tachycardia with PAC, rate of 134, QTc 433  Chest x-ray on Admission: I personally reviewed and I agree with radiologist reading as below.  CT CHEST ABDOMEN PELVIS W CONTRAST Result Date: 02/26/2024 EXAM: CT CHEST, ABDOMEN AND PELVIS WITH CONTRAST 02/26/2024 03:22:48 PM TECHNIQUE: CT of the  chest, abdomen and pelvis was performed with the administration of 100 mL of iohexol (OMNIPAQUE) 300 MG/ML solution. Multiplanar reformatted images are provided for review. Automated exposure control, iterative reconstruction, and/or weight based adjustment of the mA/kV was utilized to reduce the radiation dose to as low as reasonably achievable. COMPARISON: PET CT scan 01/20/2024. CLINICAL HISTORY: Sepsis. Weakness. Fever. Patient presents from cancer center. Chemotherapy ongoing. Lymphoma. FINDINGS: CHEST: MEDIASTINUM AND LYMPH NODES: Port in the anterior chest wall with tip in distal SVC. There is narrowing of the right bronchus intermedius with inflammatory thickening surrounding the bronchus. The bronchial narrowing has progressed from comparison PET CT scan. This region was hypermetabolic on comparison PET scan. Heart and pericardium are unremarkable. No mediastinal, hilar or axillary lymphadenopathy. LUNGS AND PLEURA: No evidence of pulmonary infection. No pulmonary nodularity. No focal consolidation or pulmonary edema. No pleural effusion or pneumothorax. ABDOMEN AND PELVIS: LIVER: The liver is unremarkable. GALLBLADDER AND BILE DUCTS: Post cholecystectomy. No biliary ductal dilatation. SPLEEN: No acute abnormality.  PANCREAS: No acute abnormality. ADRENAL GLANDS: No acute abnormality. KIDNEYS, URETERS AND BLADDER: A solid lesion of the left kidney is again demonstrated, measuring 2.2 cm. This lesion was not hypermetabolic on comparison with a PET scan. Favor benign hemorrhagic cyst. No stones in the kidneys or ureters. No hydronephrosis. No perinephric or periureteral stranding. Urinary bladder is unremarkable. GI AND BOWEL: Stomach demonstrates no acute abnormality. No evidence of bowel inflammation or obstruction. REPRODUCTIVE ORGANS: Prostate unremarkable. PERITONEUM AND RETROPERITONEUM: No ascites. No free air. VASCULATURE: Aorta is normal in caliber. ABDOMINAL AND PELVIS LYMPH NODES: No lymphadenopathy. BONES AND SOFT TISSUES: No aggressive osseous lesion. No acute osseous abnormality. No focal soft tissue abnormality. IMPRESSION: 1. No evidence of infection 2. Progression of narrowing of the right bronchus intermedius with surrounding inflammatory thickening, previously hypermetabolic on PET scan. 3. No evidence of metastatic disease. 4. Indeterminate 2.2 cm solid-appearing left renal lesion. Recommend attention on routine surveillance. Electronically signed by: Norleen Boxer MD 02/26/2024 04:01 PM EDT RP Workstation: [id number]   DG Chest Port 1 View Result Date: 02/26/2024 EXAM: 1 VIEW(S) XRAY OF THE CHEST 02/26/2024 02:04:00 PM COMPARISON: None available. CLINICAL HISTORY: Questionable sepsis - evaluate for abnormality FINDINGS: LINES, TUBES AND DEVICES: Right chest Port-A-Cath in place with tip overlying the expected region of the superior cavoatrial junction. LUNGS AND PLEURA: No focal pulmonary opacity. No pulmonary edema. No pleural effusion. No pneumothorax. HEART AND MEDIASTINUM: Atherosclerotic calcifications of the aortic arch. No acute abnormality of the cardiac and mediastinal silhouettes. BONES AND SOFT TISSUES: No acute osseous abnormality. IMPRESSION: 1. No acute cardiopulmonary process. Electronically  signed by: Norleen Boxer MD 02/26/2024 02:43 PM EDT RP Workstation: [id number]   Labs on Admission: I have personally reviewed following labs  CBC: Recent Labs  Lab 02/26/24 1307 02/26/24 1352  WBC 0.5* 0.3*  NEUTROABS 0.2* 0.1*  HGB 9.0* 9.4*  HCT 26.0* 27.4*  MCV 95.2 96.8  PLT 95* 79*   Basic Metabolic Panel: Recent Labs  Lab 02/26/24 1307 02/26/24 1352  NA 126* 126*  K 3.9 3.9  CL 93* 93*  CO2 22 21*  GLUCOSE 129* 143*  BUN 16 16  CREATININE 0.95 1.09  CALCIUM  7.8* 7.8*  MG 1.8  --    GFR: Estimated Creatinine Clearance: 63.3 mL/min (by C-G formula based on SCr of 1.09 mg/dL).  Liver Function Tests: Recent Labs  Lab 02/26/24 1307 02/26/24 1352  AST 36 36  ALT 66* 69*  ALKPHOS 70 66  BILITOT 2.4* 2.5*  PROT 6.4* 6.7  ALBUMIN 3.1* 3.3*   Recent Labs  Lab 02/26/24 1352  LIPASE 17   Coagulation Profile: Recent Labs  Lab 02/26/24 1352  INR 1.0   Urine analysis:    Component Value Date/Time   COLORURINE YELLOW (A) 11/19/2023 1728   APPEARANCEUR CLEAR (A) 11/19/2023 1728   APPEARANCEUR Clear 09/13/2019 0900   LABSPEC 1.016 11/19/2023 1728   PHURINE 5.0 11/19/2023 1728   GLUCOSEU NEGATIVE 11/19/2023 1728   HGBUR NEGATIVE 11/19/2023 1728   BILIRUBINUR NEGATIVE 11/19/2023 1728   BILIRUBINUR Negative 09/13/2019 0900   KETONESUR 20 (A) 11/19/2023 1728   PROTEINUR NEGATIVE 11/19/2023 1728   UROBILINOGEN 0.2 12/11/2015 1417   NITRITE NEGATIVE 11/19/2023 1728   LEUKOCYTESUR NEGATIVE 11/19/2023 1728   CRITICAL CARE Performed by: Dr. Sherre  Total critical care time: 32 minutes  Critical care time was exclusive of separately billable procedures and treating other patients.  Critical care was necessary to treat or prevent imminent or life-threatening deterioration.  Critical care was time spent personally by me on the following activities: development of treatment plan with patient and spouse at bedside, as well as nursing, discussions with  consultants, evaluation of patient's response to treatment, examination of patient, obtaining history from patient or surrogate, ordering and performing treatments and interventions, ordering and review of laboratory studies, ordering and review of radiographic studies, pulse oximetry and re-evaluation of patient's condition.  This document was prepared using Dragon Voice Recognition software and may include unintentional dictation errors.  Dr. Sherre [facility]  If 7PM-7AM, please contact overnight-coverage provider If 7AM-7PM, please contact day attending provider [email or web address]  02/26/2024, 4:53 PM

## 2024-02-26 NOTE — ED Provider Notes (Signed)
 [facility] Provider Note    Event Date/Time   First MD Initiated Contact with Patient 02/26/24 1336     (approximate)   History   Fever   HPI  Ruben Garcia is a 78 y.o. male past medical history significant for diffuse large cell B lymphoma stage IV currently on chemotherapy who presents to the emergency department with fever.  Last round of chemotherapy was approximately 1 week ago this past Thursday.  States that he has been doing okay but started to not feel well.  Went over to the cancer Center for fever and weakness.  Found to have a blood pressure of 60/44 and was told to come to the emergency department.  States that he has not been feeling well with generalized weakness and fatigue.  Decreased p.o. intake over the past 2 days.  Decreased urine output.  Does endorse significant cough.  No dysuria, urinary urgency or frequency.  Denies any vomiting or diarrhea.  No rashes.  No falls or trauma.     Physical Exam   Triage Vital Signs: ED Triage Vitals  Encounter Vitals Group     BP 02/26/24 1330 (!) 69/50     Girls Systolic BP Percentile --      Girls Diastolic BP Percentile --      Boys Systolic BP Percentile --      Boys Diastolic BP Percentile --      Pulse Rate 02/26/24 1330 (!) 137     Resp 02/26/24 1330 18     Temp 02/26/24 1339 (!) 102.9 F (39.4 C)     Temp Source 02/26/24 1339 Rectal     SpO2 02/26/24 1330 96 %     Weight --      Height --      Head Circumference --      Peak Flow --      Pain Score 02/26/24 1333 0     Pain Loc --      Pain Education --      Exclude from Growth Chart --     Most recent vital signs: Vitals:   02/26/24 1443 02/26/24 1445  BP:  103/61  Pulse:  (!) 115  Resp:  (!) 28  Temp: 100.2 F (37.9 C)   SpO2:  95%    Physical Exam Constitutional:      Appearance: He is well-developed. He is ill-appearing.  HENT:     Head: Atraumatic.     Mouth/Throat:     Mouth: Mucous membranes are dry.   Eyes:     Conjunctiva/sclera: Conjunctivae normal.  Cardiovascular:     Rate and Rhythm: Regular rhythm. Tachycardia present.  Pulmonary:     Effort: Respiratory distress present.     Comments: Tachypneic, coughing in the room Abdominal:     Tenderness: There is no abdominal tenderness.  Musculoskeletal:     Cervical back: Normal range of motion.     Right lower leg: No edema.     Left lower leg: No edema.  Skin:    General: Skin is warm.     Capillary Refill: Capillary refill takes 2 to 3 seconds.     Comments: Port in place to the chest wall  Neurological:     Mental Status: He is alert. Mental status is at baseline.     IMPRESSION / MDM / ASSESSMENT AND PLAN / ED COURSE  I reviewed the triage vital signs and the nursing notes.  On arrival patient found  to be febrile to 102.9, tachycardic and hypotensive with a blood pressure of 61/44.  Concern for sepsis, patient is immunocompromise, sepsis order set utilized, blood cultures obtained.  Felt that 30 cc/kg of IV fluids may be detrimental to the patient given his improvement after IV fluid started from cancer center, will give 1 L of IV fluids and reevaluate.  Started on broad-spectrum antibiotics to cover for unknown source  Differential diagnosis including neutropenic fever, urinary tract infection, pneumonia, viral illness including COVID/influenza, sepsis, dehydration.  No significant altered mental status and no meningismus on exam, have a low suspicion for meningitis.  EKG  I, Clotilda Punter, the attending physician, personally viewed and interpreted this ECG.  EKG with sinus tachycardia with a heart rate of 134.  Narrow complex.  1 PAC.  Normal intervals.  No significant ST elevation or depression.  Nonspecific ST changes.  Sinus tachycardia while on cardiac telemetry.  RADIOLOGY I independently reviewed imaging, my interpretation of imaging: Chest x-ray with no findings of pneumonia  CT chest abdomen pelvis with  contrast ordered to further evaluate for findings of sepsis  LABS (all labs ordered are listed, but only abnormal results are displayed) Labs interpreted as -    Labs Reviewed  LACTIC ACID, PLASMA - Abnormal; Notable for the following components:      Result Value   Lactic Acid, Venous 3.4 (*)    All other components within normal limits  COMPREHENSIVE METABOLIC PANEL WITH GFR - Abnormal; Notable for the following components:   Sodium 126 (*)    Chloride 93 (*)    CO2 21 (*)    Glucose, Bld 143 (*)    Calcium  7.8 (*)    Albumin 3.3 (*)    ALT 69 (*)    Total Bilirubin 2.5 (*)    All other components within normal limits  CBC WITH DIFFERENTIAL/PLATELET - Abnormal; Notable for the following components:   WBC 0.3 (*)    RBC 2.83 (*)    Hemoglobin 9.4 (*)    HCT 27.4 (*)    Platelets 79 (*)    Neutro Abs 0.1 (*)    Lymphs Abs 0.1 (*)    All other components within normal limits  RESP PANEL BY RT-PCR (RSV, FLU A&B, COVID)  RVPGX2  CULTURE, BLOOD (ROUTINE X 2)  CULTURE, BLOOD (ROUTINE X 2)  PROTIME-INR  LIPASE, BLOOD  LACTIC ACID, PLASMA  URINALYSIS, W/ REFLEX TO CULTURE (INFECTION SUSPECTED)     MDM  White count significantly low at 0.3, neutrophils of 0.1.  Pancytopenic with a hemoglobin of 9.4 and a platelet of 79.  Likely in the setting of chemotherapy.  Patient is covered for neutropenic fever.  Creatinine appears to be at his baseline.  No significant elevation in BUN.  Does have an elevated T. bili at 2.5 but otherwise has overall normal LFTs.  Hyponatremia at 126 which is new for the patient.  Lactic acid elevated at 3.4.  COVID and influenza testing are negative.  No obvious findings on chest x-ray of pneumonia.  Order another 1 L of IV fluids.  Significant proved of his mental status following antipyretics.  On broad-spectrum antibiotics.  Consulted hospitalist for admission for neutropenic fever and sepsis     PROCEDURES:  Critical Care performed:  yes  .Critical Care  Performed by: Punter Clotilda, MD Authorized by: Punter Clotilda, MD   Critical care provider statement:    Critical care time (minutes):  45   Critical care time was exclusive  of:  Separately billable procedures and treating other patients   Critical care was necessary to treat or prevent imminent or life-threatening deterioration of the following conditions:  Sepsis   Critical care was time spent personally by me on the following activities:  Development of treatment plan with patient or surrogate, discussions with consultants, evaluation of patient's response to treatment, examination of patient, ordering and review of laboratory studies, ordering and review of radiographic studies, ordering and performing treatments and interventions, pulse oximetry, re-evaluation of patient's condition and review of old charts   Care discussed with: admitting provider     Patient's presentation is most consistent with acute presentation with potential threat to life or bodily function.   MEDICATIONS ORDERED IN ED: Medications  lactated ringers  infusion (has no administration in time range)  metroNIDAZOLE (FLAGYL) IVPB 500 mg (has no administration in time range)  vancomycin (VANCOCIN) IVPB 1000 mg/200 mL premix (has no administration in time range)  acetaminophen  (TYLENOL ) suppository 650 mg (650 mg Rectal Given 02/26/24 1347)  sodium chloride  0.9 % bolus 1,000 mL (1,000 mLs Intravenous New Bag/Given 02/26/24 1336)  ceFEPIme (MAXIPIME) 2 g in sodium chloride  0.9 % 100 mL IVPB (2 g Intravenous New Bag/Given 02/26/24 1434)  lactated ringers  bolus 1,000 mL (1,000 mLs Intravenous New Bag/Given 02/26/24 1440)  iohexol (OMNIPAQUE) 300 MG/ML solution 100 mL (100 mLs Intravenous Contrast Given 02/26/24 1508)    FINAL CLINICAL IMPRESSION(S) / ED DIAGNOSES   Final diagnoses:  Sepsis, due to unspecified organism, unspecified whether acute organ dysfunction present [facility])  Neutropenic  fever     Rx / DC Orders   ED Discharge Orders     None        Note:  This document was prepared using Dragon voice recognition software and may include unintentional dictation errors.   Suzanne Kirsch, MD 02/26/24 [phone]

## 2024-02-26 NOTE — Progress Notes (Signed)
 Pt becoming more lethargic and less responsive. Vital signs taken B/P 61/44 notified Josh Borders NP. Started NS bolus by gravity and Josh Borders and Nurse escorted Pt with wife immediately to ER via wheelchair. Report given to ER nurse and Borders, NP providing history to ER nurse.

## 2024-02-26 NOTE — Progress Notes (Signed)
 1320-Josh NP and RN alerted by fluid nurse that patient was disoriented and extremely hypotensive- 61/44 bp. Patient escorted to ER w/c with fluid bolus.  1345-RN spoke with Cyndee in cancer center lab. Critical value reported. Anc 0.2; wbc 0.5. read back process performed with lab tech. 1346-Josh Border, NP informed of critical value. Read back process performed with APP.

## 2024-02-26 NOTE — Sepsis Progress Note (Signed)
 Code sepsis protocol being monitored by eLink.

## 2024-02-26 NOTE — Assessment & Plan Note (Signed)
 Per medical oncologist, no indication for Neupogen as patient received Neulasta  with chemotherapy infusion on 02/19/2024

## 2024-02-26 NOTE — Consult Note (Signed)
 Pharmacy Antibiotic Note  Ruben Garcia is a 78 y.o. male admitted on 02/26/2024 with sepsis.  Pharmacy has been consulted for vancomycin and cefepime dosing.  Plan: Give vancomycin 2000 mg IV x 1, then start vancomycin 1750 mg IV every 24 hours Estimated AUC 519, Cmin 11 IBW, Scr 1.09, Vd 0.72 Vancomycin levels at steady state or as clinically indicated Start cefepime 2 grams IV every 8 hours  Flagyl 500 mg IV every 12 hours per provider Follow renal function and cultures for adjustments     Temp (24hrs), Avg:101.2 F (38.4 C), Min:100.2 F (37.9 C), Max:102.9 F (39.4 C)  Recent Labs  Lab 02/26/24 1307 02/26/24 1352  WBC 0.5* 0.3*  CREATININE 0.95 1.09  LATICACIDVEN  --  3.4*    Estimated Creatinine Clearance: 63.3 mL/min (by C-G formula based on SCr of 1.09 mg/dL).    Allergies  Allergen Reactions   Codeine Nausea Only    Antimicrobials this admission: vancomycin 10/31 >>  cefepime 10/31 >>  Flagyl 10/31 >>   Microbiology results: 10/31 BCx: pending  Thank you for allowing pharmacy to be a part of this patient's care.  Kayla JULIANNA Blew, PharmD 02/26/2024 3:47 PM

## 2024-02-26 NOTE — ED Triage Notes (Signed)
 Patient to ED via POV for weakness/fever from the cancer center. BP 61/44 at cancer center. Last had chemo last week.

## 2024-02-26 NOTE — Telephone Encounter (Signed)
 11 am- spoke with patient's wife. Smc apt arranged for 1pm today. Port lab/ smc/ and possible iv fluids. Patient c/o cough, congestion. No fever. New fall. Wife found pt on floor of bathroom last night. Pt denies any trauma/injury r/t to fall. Denies nausea, vomiting.

## 2024-02-26 NOTE — Assessment & Plan Note (Signed)
 PDMP reviewed Current active prescription includes hydrocodone -chlorphen suspension, quantity of 120, for 24 days written and filled on 02/08/2024.  Diphenoxylate-atropine 2.5-0.025, 45-day supply, 12 days written and filled on 01/28/2024

## 2024-02-26 NOTE — Telephone Encounter (Signed)
 Patient's wife calls with concerns of patient having increased cough, congestion without fever, and weakness for the past 2 days.  Not eating with minimal intake of liquid electrolytes.  Patient is so weak that he had a fall last night while trying to ambulate to restroom.  No injury during fall other than a scrape on his arm.

## 2024-02-26 NOTE — Telephone Encounter (Signed)
 Encounter opened in error.

## 2024-02-26 NOTE — Assessment & Plan Note (Addendum)
 Etiology workup in progress Blood cultures x 2 are in process UA has been ordered and pending collection Continue with cefepime and vancomycin per pharmacy, metronidazole 500 mg IV twice daily Maintain MAP greater than 65 Admit to PCU, inpatient

## 2024-02-26 NOTE — Assessment & Plan Note (Signed)
 Currently low normotensive Will not order scheduled antihypertensive medication at this time Hydralazine 5 mg IV every 6 hours as needed for SBP greater than 175, 5 days ordered

## 2024-02-26 NOTE — Hospital Course (Signed)
 Mr. Ruben Garcia is a 78 year old male with history of diffuse large B-cell lymphoma, stage IV, GERD, history of hypertension, who presents ED for chief concerns of weakness, fever.  Vitals in the ED showed Tmax of 100.5, currently improved to 100.2, respiration rate of 20, heart rate 124, improved to 108, blood pressure initially 113/73, SpO2 of 98% on room air.  Per ED documentation, patient's blood pressure at cancer center was 61/44.  ED treatment: Acetaminophen  650 mg p.o. one-time dose, cefepime 2 g IV one-time dose, sodium chloride  1 L bolus, LR 1 L bolus, vancomycin per pharmacy, metronidazole 500 mg IV one-time dose.

## 2024-02-26 NOTE — ED Notes (Addendum)
 Pt c/o feeling like he had a full bladder after urination. Performed bladder scan. Pt had >363 in his bladder.

## 2024-02-27 DIAGNOSIS — A419 Sepsis, unspecified organism: Secondary | ICD-10-CM | POA: Diagnosis not present

## 2024-02-27 DIAGNOSIS — D709 Neutropenia, unspecified: Secondary | ICD-10-CM | POA: Diagnosis present

## 2024-02-27 DIAGNOSIS — R6520 Severe sepsis without septic shock: Secondary | ICD-10-CM | POA: Diagnosis not present

## 2024-02-27 LAB — CBC WITH DIFFERENTIAL/PLATELET
Abs Immature Granulocytes: 0.04 K/uL (ref 0.00–0.07)
Basophils Absolute: 0 K/uL (ref 0.0–0.1)
Basophils Relative: 0 %
Eosinophils Absolute: 0 K/uL (ref 0.0–0.5)
Eosinophils Relative: 0 %
HCT: 20.5 % — ABNORMAL LOW (ref 39.0–52.0)
Hemoglobin: 7 g/dL — ABNORMAL LOW (ref 13.0–17.0)
Immature Granulocytes: 7 %
Lymphocytes Relative: 27 %
Lymphs Abs: 0.2 K/uL — ABNORMAL LOW (ref 0.7–4.0)
MCH: 33.7 pg (ref 26.0–34.0)
MCHC: 34.1 g/dL (ref 30.0–36.0)
MCV: 98.6 fL (ref 80.0–100.0)
Monocytes Absolute: 0.1 K/uL (ref 0.1–1.0)
Monocytes Relative: 22 %
Neutro Abs: 0.3 K/uL — CL (ref 1.7–7.7)
Neutrophils Relative %: 44 %
Platelets: 57 K/uL — ABNORMAL LOW (ref 150–400)
RBC: 2.08 MIL/uL — ABNORMAL LOW (ref 4.22–5.81)
RDW: 15.4 % (ref 11.5–15.5)
Smear Review: NORMAL
WBC: 0.6 K/uL — CL (ref 4.0–10.5)
nRBC: 0 % (ref 0.0–0.2)

## 2024-02-27 LAB — BASIC METABOLIC PANEL WITH GFR
Anion gap: 9 (ref 5–15)
BUN: 14 mg/dL (ref 8–23)
CO2: 24 mmol/L (ref 22–32)
Calcium: 7.5 mg/dL — ABNORMAL LOW (ref 8.9–10.3)
Chloride: 100 mmol/L (ref 98–111)
Creatinine, Ser: 0.71 mg/dL (ref 0.61–1.24)
GFR, Estimated: >60 mL/min (ref >60)
Glucose, Bld: 85 mg/dL (ref 70–99)
Potassium: 3.5 mmol/L (ref 3.5–5.1)
Sodium: 133 mmol/L — ABNORMAL LOW (ref 135–145)

## 2024-02-27 LAB — RESPIRATORY PANEL BY PCR

## 2024-02-27 LAB — ABO/RH: ABO/RH(D): A POS

## 2024-02-27 LAB — PREPARE RBC (CROSSMATCH)

## 2024-02-27 MED ORDER — BENZONATATE 100 MG PO CAPS
200.0000 mg | ORAL_CAPSULE | Freq: Three times a day (TID) | ORAL | Status: DC
  Administered 2024-02-27 – 2024-02-29 (×7): 200 mg via ORAL
  Filled 2024-02-27 (×7): qty 2

## 2024-02-27 MED ORDER — SODIUM CHLORIDE 0.9% FLUSH
10.0000 mL | Freq: Two times a day (BID) | INTRAVENOUS | Status: DC
  Administered 2024-02-28: 10 mL
  Administered 2024-02-28 – 2024-02-29 (×2): 20 mL

## 2024-02-27 MED ORDER — VANCOMYCIN HCL IN DEXTROSE 1-5 GM/200ML-% IV SOLN
1000.0000 mg | Freq: Two times a day (BID) | INTRAVENOUS | Status: DC
  Administered 2024-02-27 – 2024-02-28 (×4): 1000 mg via INTRAVENOUS
  Filled 2024-02-27 (×6): qty 200

## 2024-02-27 MED ORDER — SODIUM CHLORIDE 0.9% FLUSH: 10.0000 mL | INTRAVENOUS | Status: DC | PRN

## 2024-02-27 MED ORDER — CHLORHEXIDINE GLUCONATE CLOTH 2 % EX PADS
6.0000 | MEDICATED_PAD | Freq: Every day | CUTANEOUS | Status: DC
  Administered 2024-02-27 – 2024-02-29 (×3): 6 via TOPICAL

## 2024-02-27 MED ORDER — LACTATED RINGERS IV SOLN: INTRAVENOUS | Status: AC

## 2024-02-27 MED ORDER — SODIUM CHLORIDE 0.9% IV SOLUTION
Freq: Once | INTRAVENOUS | Status: DC
  Filled 2024-02-27: qty 250

## 2024-02-27 NOTE — ED Notes (Signed)
 MD has spoke w/ pt regarding blood transfusion per pt/MD report.

## 2024-02-27 NOTE — ED Notes (Signed)
 Transportation requested

## 2024-02-27 NOTE — Consult Note (Signed)
 Pharmacy Antibiotic Note  NEDIM OKI is a 78 y.o. male admitted on 02/26/2024 with sepsis.  Pharmacy has been consulted for vancomycin and cefepime dosing.  Plan: Pt received vancomycin 2000 mg x 1 loading. Will order vancomycin 1000 mg q12H. Predicted AUC of 444. Goal AUC of 400-550. Scr 0.8, IBW, Vd 0.72. Plan to order vancomycin level after 4th or 5th dose.   Continue cefepime 2 g q8H  Continue flagyl 500 mg BID      Temp (24hrs), Avg:99.6 F (37.6 C), Min:98.1 F (36.7 C), Max:102.9 F (39.4 C)  Recent Labs  Lab 02/26/24 1307 02/26/24 1352 02/26/24 1627 02/27/24 0456  WBC 0.5* 0.3*  --  0.6*  CREATININE 0.95 1.09  --  0.71  LATICACIDVEN  --  3.4* 1.7  --     Estimated Creatinine Clearance: 86.2 mL/min (by C-G formula based on SCr of 0.71 mg/dL).    Allergies  Allergen Reactions   Codeine Nausea Only    Antimicrobials this admission: vancomycin 10/31 >>  cefepime 10/31 >>  Flagyl 10/31 >>   Microbiology results: 10/31 BCx: pending  Thank you for allowing pharmacy to be a part of this patient's care.  Cathaleen GORMAN Blanch, PharmD 02/27/2024 11:04 AM

## 2024-02-27 NOTE — Plan of Care (Signed)
  Problem: Fluid Volume: Goal: Hemodynamic stability will improve Outcome: Progressing   Problem: Clinical Measurements: Goal: Diagnostic test results will improve Outcome: Progressing   Problem: Respiratory: Goal: Ability to maintain adequate ventilation will improve Outcome: Progressing   Problem: Education: Goal: Knowledge of General Education information will improve Description: Including pain rating scale, medication(s)/side effects and non-pharmacologic comfort measures Outcome: Progressing   Problem: Health Behavior/Discharge Planning: Goal: Ability to manage health-related needs will improve Outcome: Progressing   Problem: Clinical Measurements: Goal: Ability to maintain clinical measurements within normal limits will improve Outcome: Progressing   Problem: Activity: Goal: Risk for activity intolerance will decrease Outcome: Progressing   Problem: Nutrition: Goal: Adequate nutrition will be maintained Outcome: Progressing   Problem: Coping: Goal: Level of anxiety will decrease Outcome: Progressing   Problem: Elimination: Goal: Will not experience complications related to bowel motility Outcome: Progressing   Problem: Pain Managment: Goal: General experience of comfort will improve and/or be controlled Outcome: Progressing   Problem: Safety: Goal: Ability to remain free from injury will improve Outcome: Progressing   Problem: Skin Integrity: Goal: Risk for impaired skin integrity will decrease Outcome: Progressing

## 2024-02-27 NOTE — Progress Notes (Signed)
 PROGRESS NOTE    Ruben Garcia  FMW:[id number] DOB: 04-26-1946 DOA: 02/26/2024 PCP: Ruben Jon HERO, MD    Brief Narrative:   Mr. Ruben Garcia is a 78 year old male with history of diffuse large B-cell lymphoma, stage IV, GERD, history of hypertension, who presents ED for chief concerns of weakness, fever.   He reports that he has been coughing a lot since he started chemotherapy.  He denies productive cough.  He denies chest pain, shortness of breath, abdominal pain, blood in his urine or stool.  He denies swelling of the lower extremity, nausea, vomiting, syncope.   He reports dysuria since starting on chemotherapy.  He endorses generalized appetite loss.  Assessment & Plan:     Neutropenic fever Severe sepsis with acute organ dysfunction [facility]) Source is not clear.  There is some suspicion for a viral infection.  Pan scan negative for source of infection.  Urinalysis inconsistent with infection.  Unable to exclude bacterial source in setting of immunocompromise state. Plan: Continue with broad-spectrum IV antibiotics cefepime, vancomycin, metronidazole Follow cultures blood and urine Maintain MAP greater than 65 Okay for transfer to floor  Leukopenia Acute on chronic anemia Per medical oncologist, no indication for Neupogen as patient received Neulasta  with chemotherapy infusion on 02/19/2024.  Patient's hemoglobin did drop to 7.  Recommend 1 unit PRBC.  Discussed with oncology.  Will give irradiated blood products.   Essential (primary) hypertension Currently low normotensive Hold scheduled antihypertensive Hydralazine 5 mg IV every 6 hours as needed for SBP greater than 175, 5 days ordered   Cancer, metastatic to bone [facility]) Oncology to see while admitted   Chronic pain syndrome PDMP reviewed Resume current narcotic prescription    DVT prophylaxis: SQH Code Status: Full Family Communication: Multiple family members at bedside 11/1 Disposition Plan: Status  is: Inpatient Remains inpatient appropriate because: Neutropenic fever   Level of care: Telemetry  Consultants:  Oncology  Procedures:  None  Antimicrobials: Vancomycin Cefepime Metronidazole   Subjective: Seen and examined.  Resting in bed.  Endorses fatigue otherwise stable.  Objective: Vitals:   02/27/24 1000 02/27/24 1030 02/27/24 1112 02/27/24 1131  BP: 117/69 126/75  132/78  Pulse: (!) 107 (!) 111  97  Resp: 19 17  16   Temp:   98 F (36.7 C) 98.8 F (37.1 C)  TempSrc:   Oral Oral  SpO2: 96% 99%  99%    Intake/Output Summary (Last 24 hours) at 02/27/2024 1214 Last data filed at 02/27/2024 1119 Gross per 24 hour  Intake 3038.64 ml  Output 800 ml  Net 2238.64 ml   There were no vitals filed for this visit.  Examination:  General exam: Appears calm and comfortable  Respiratory system: Clear to auscultation. Respiratory effort normal. Cardiovascular system: S1-S2, RRR, no murmurs, no pedal edema Gastrointestinal system: Soft, NT/ND, bowel sounds Central nervous system: Alert and oriented. No focal neurological deficits. Extremities: Symmetric 5 x 5 power. Skin: No rashes, lesions or ulcers Psychiatry: Judgement and insight appear normal. Mood & affect appropriate.     Data Reviewed: I have personally reviewed following labs and imaging studies  CBC: Recent Labs  Lab 02/26/24 1307 02/26/24 1352 02/27/24 0456  WBC 0.5* 0.3* 0.6*  NEUTROABS 0.2* 0.1* 0.3*  HGB 9.0* 9.4* 7.0*  HCT 26.0* 27.4* 20.5*  MCV 95.2 96.8 98.6  PLT 95* 79* 57*   Basic Metabolic Panel: Recent Labs  Lab 02/26/24 1307 02/26/24 1352 02/27/24 0456  NA 126* 126* 133*  K 3.9 3.9 3.5  CL 93* 93* 100  CO2 22 21* 24  GLUCOSE 129* 143* 85  BUN 16 16 14   CREATININE 0.95 1.09 0.71  CALCIUM  7.8* 7.8* 7.5*  MG 1.8  --   --    GFR: Estimated Creatinine Clearance: 86.2 mL/min (by C-G formula based on SCr of 0.71 mg/dL). Liver Function Tests: Recent Labs  Lab 02/26/24 1307  02/26/24 1352  AST 36 36  ALT 66* 69*  ALKPHOS 70 66  BILITOT 2.4* 2.5*  PROT 6.4* 6.7  ALBUMIN 3.1* 3.3*   Recent Labs  Lab 02/26/24 1352  LIPASE 17   No results for input(s): AMMONIA in the last 168 hours. Coagulation Profile: Recent Labs  Lab 02/26/24 1352  INR 1.0   Cardiac Enzymes: No results for input(s): CKTOTAL, CKMB, CKMBINDEX, TROPONINI in the last 168 hours. BNP (last 3 results) No results for input(s): PROBNP in the last 8760 hours. HbA1C: No results for input(s): HGBA1C in the last 72 hours. CBG: No results for input(s): GLUCAP in the last 168 hours. Lipid Profile: No results for input(s): CHOL, HDL, LDLCALC, TRIG, CHOLHDL, LDLDIRECT in the last 72 hours. Thyroid Function Tests: No results for input(s): TSH, T4TOTAL, FREET4, T3FREE, THYROIDAB in the last 72 hours. Anemia Panel: No results for input(s): VITAMINB12, FOLATE, FERRITIN, TIBC, IRON, RETICCTPCT in the last 72 hours. Sepsis Labs: Recent Labs  Lab 02/26/24 1352 02/26/24 1627  LATICACIDVEN 3.4* 1.7    Recent Results (from the past 240 hours)  Resp panel by RT-PCR (RSV, Flu A&B, Covid) Anterior Nasal Swab     Status: None   Collection Time: 02/26/24  1:52 PM   Specimen: Anterior Nasal Swab  Result Value Ref Range Status   SARS Coronavirus 2 by RT PCR NEGATIVE NEGATIVE Final    Comment: (NOTE) SARS-CoV-2 target nucleic acids are NOT DETECTED.  The SARS-CoV-2 RNA is generally detectable in upper respiratory specimens during the acute phase of infection. The lowest concentration of SARS-CoV-2 viral copies this assay can detect is 138 copies/mL. A negative result does not preclude SARS-Cov-2 infection and should not be used as the sole basis for treatment or other patient management decisions. A negative result may occur with  improper specimen collection/handling, submission of specimen other than nasopharyngeal swab, presence of viral  mutation(s) within the areas targeted by this assay, and inadequate number of viral copies(<138 copies/mL). A negative result must be combined with clinical observations, patient history, and epidemiological information. The expected result is Negative.  Fact Sheet for Patients:  bloggercourse.com  Fact Sheet for Healthcare Providers:  seriousbroker.it  This test is no t yet approved or cleared by the [place]  FDA and  has been authorized for detection and/or diagnosis of SARS-CoV-2 by FDA under an Emergency Use Authorization (EUA). This EUA will remain  in effect (meaning this test can be used) for the duration of the COVID-19 declaration under Section 564(b)(1) of the Act, 21 U.S.C.section 360bbb-3(b)(1), unless the authorization is terminated  or revoked sooner.       Influenza A by PCR NEGATIVE NEGATIVE Final   Influenza B by PCR NEGATIVE NEGATIVE Final    Comment: (NOTE) The Xpert Xpress SARS-CoV-2/FLU/RSV plus assay is intended as an aid in the diagnosis of influenza from Nasopharyngeal swab specimens and should not be used as a sole basis for treatment. Nasal washings and aspirates are unacceptable for Xpert Xpress SARS-CoV-2/FLU/RSV testing.  Fact Sheet for Patients: bloggercourse.com  Fact Sheet for Healthcare Providers: seriousbroker.it  This test is not yet approved  or cleared by the [place]  FDA and has been authorized for detection and/or diagnosis of SARS-CoV-2 by FDA under an Emergency Use Authorization (EUA). This EUA will remain in effect (meaning this test can be used) for the duration of the COVID-19 declaration under Section 564(b)(1) of the Act, 21 U.S.C. section 360bbb-3(b)(1), unless the authorization is terminated or revoked.     Resp Syncytial Virus by PCR NEGATIVE NEGATIVE Final    Comment: (NOTE) Fact Sheet for  Patients: bloggercourse.com  Fact Sheet for Healthcare Providers: seriousbroker.it  This test is not yet approved or cleared by the [place]  FDA and has been authorized for detection and/or diagnosis of SARS-CoV-2 by FDA under an Emergency Use Authorization (EUA). This EUA will remain in effect (meaning this test can be used) for the duration of the COVID-19 declaration under Section 564(b)(1) of the Act, 21 U.S.C. section 360bbb-3(b)(1), unless the authorization is terminated or revoked.  Performed at [facility], [address] [place], [place] [address]   Blood Culture (routine x 2)     Status: None (Preliminary result)   Collection Time: 02/26/24  1:52 PM   Specimen: BLOOD  Result Value Ref Range Status   Specimen Description BLOOD RIGHT ANTECUBITAL  Final   Special Requests   Final    BOTTLES DRAWN AEROBIC AND ANAEROBIC Blood Culture results may not be optimal due to an inadequate volume of blood received in culture bottles   Culture   Final    NO GROWTH < 24 HOURS Performed at [facility], [address], [place], [place] [address]    Report Status PENDING  Incomplete  Blood Culture (routine x 2)     Status: None (Preliminary result)   Collection Time: 02/26/24  1:52 PM   Specimen: BLOOD  Result Value Ref Range Status   Specimen Description BLOOD BLOOD LEFT FOREARM  Final   Special Requests   Final    BOTTLES DRAWN AEROBIC AND ANAEROBIC Blood Culture results may not be optimal due to an inadequate volume of blood received in culture bottles   Culture   Final    NO GROWTH < 24 HOURS Performed at [facility], [address] [place], [place] [address]    Report Status PENDING  Incomplete         Radiology Studies: CT CHEST ABDOMEN PELVIS W CONTRAST Result Date: 02/26/2024 EXAM: CT CHEST, ABDOMEN AND PELVIS WITH CONTRAST 02/26/2024 03:22:48 PM TECHNIQUE: CT of the chest,  abdomen and pelvis was performed with the administration of 100 mL of iohexol (OMNIPAQUE) 300 MG/ML solution. Multiplanar reformatted images are provided for review. Automated exposure control, iterative reconstruction, and/or weight based adjustment of the mA/kV was utilized to reduce the radiation dose to as low as reasonably achievable. COMPARISON: PET CT scan 01/20/2024. CLINICAL HISTORY: Sepsis. Weakness. Fever. Patient presents from cancer center. Chemotherapy ongoing. Lymphoma. FINDINGS: CHEST: MEDIASTINUM AND LYMPH NODES: Port in the anterior chest wall with tip in distal SVC. There is narrowing of the right bronchus intermedius with inflammatory thickening surrounding the bronchus. The bronchial narrowing has progressed from comparison PET CT scan. This region was hypermetabolic on comparison PET scan. Heart and pericardium are unremarkable. No mediastinal, hilar or axillary lymphadenopathy. LUNGS AND PLEURA: No evidence of pulmonary infection. No pulmonary nodularity. No focal consolidation or pulmonary edema. No pleural effusion or pneumothorax. ABDOMEN AND PELVIS: LIVER: The liver is unremarkable. GALLBLADDER AND BILE DUCTS: Post cholecystectomy. No biliary ductal dilatation. SPLEEN: No acute abnormality. PANCREAS: No acute  abnormality. ADRENAL GLANDS: No acute abnormality. KIDNEYS, URETERS AND BLADDER: A solid lesion of the left kidney is again demonstrated, measuring 2.2 cm. This lesion was not hypermetabolic on comparison with a PET scan. Favor benign hemorrhagic cyst. No stones in the kidneys or ureters. No hydronephrosis. No perinephric or periureteral stranding. Urinary bladder is unremarkable. GI AND BOWEL: Stomach demonstrates no acute abnormality. No evidence of bowel inflammation or obstruction. REPRODUCTIVE ORGANS: Prostate unremarkable. PERITONEUM AND RETROPERITONEUM: No ascites. No free air. VASCULATURE: Aorta is normal in caliber. ABDOMINAL AND PELVIS LYMPH NODES: No lymphadenopathy. BONES  AND SOFT TISSUES: No aggressive osseous lesion. No acute osseous abnormality. No focal soft tissue abnormality. IMPRESSION: 1. No evidence of infection 2. Progression of narrowing of the right bronchus intermedius with surrounding inflammatory thickening, previously hypermetabolic on PET scan. 3. No evidence of metastatic disease. 4. Indeterminate 2.2 cm solid-appearing left renal lesion. Recommend attention on routine surveillance. Electronically signed by: Norleen Boxer MD 02/26/2024 04:01 PM EDT RP Workstation: [id number]   DG Chest Port 1 View Result Date: 02/26/2024 EXAM: 1 VIEW(S) XRAY OF THE CHEST 02/26/2024 02:04:00 PM COMPARISON: None available. CLINICAL HISTORY: Questionable sepsis - evaluate for abnormality FINDINGS: LINES, TUBES AND DEVICES: Right chest Port-A-Cath in place with tip overlying the expected region of the superior cavoatrial junction. LUNGS AND PLEURA: No focal pulmonary opacity. No pulmonary edema. No pleural effusion. No pneumothorax. HEART AND MEDIASTINUM: Atherosclerotic calcifications of the aortic arch. No acute abnormality of the cardiac and mediastinal silhouettes. BONES AND SOFT TISSUES: No acute osseous abnormality. IMPRESSION: 1. No acute cardiopulmonary process. Electronically signed by: Norleen Boxer MD 02/26/2024 02:43 PM EDT RP Workstation: [id number]        Scheduled Meds:  sodium chloride    Intravenous Once   benzonatate   200 mg Oral TID   Continuous Infusions:  ceFEPime (MAXIPIME) IV Stopped (02/27/24 0631)   metronidazole Stopped (02/27/24 0554)   vancomycin       LOS: 1 day      Calvin KATHEE Robson, MD [facility]   If 7PM-7AM, please contact night-coverage  02/27/2024, 12:14 PM

## 2024-02-28 DIAGNOSIS — A419 Sepsis, unspecified organism: Secondary | ICD-10-CM

## 2024-02-28 DIAGNOSIS — R6520 Severe sepsis without septic shock: Secondary | ICD-10-CM

## 2024-02-28 LAB — BASIC METABOLIC PANEL WITH GFR
Anion gap: 7 (ref 5–15)
BUN: 7 mg/dL — ABNORMAL LOW (ref 8–23)
CO2: 25 mmol/L (ref 22–32)
Calcium: 7.6 mg/dL — ABNORMAL LOW (ref 8.9–10.3)
Chloride: 100 mmol/L (ref 98–111)
Creatinine, Ser: 0.45 mg/dL — ABNORMAL LOW (ref 0.61–1.24)
GFR, Estimated: >60 mL/min (ref >60)
Glucose, Bld: 90 mg/dL (ref 70–99)
Potassium: 2.9 mmol/L — ABNORMAL LOW (ref 3.5–5.1)
Sodium: 132 mmol/L — ABNORMAL LOW (ref 135–145)

## 2024-02-28 LAB — TYPE AND SCREEN
ABO/RH(D): A POS
Antibody Screen: NEGATIVE
Unit division: 0

## 2024-02-28 LAB — HEMOGLOBIN AND HEMATOCRIT, BLOOD
HCT: 23.6 % — ABNORMAL LOW (ref 39.0–52.0)
Hemoglobin: 8.2 g/dL — ABNORMAL LOW (ref 13.0–17.0)

## 2024-02-28 LAB — BPAM RBC
Blood Product Expiration Date: 202511282359
ISSUE DATE / TIME: 202511011329
Unit Type and Rh: 6200

## 2024-02-28 LAB — GLUCOSE, CAPILLARY: Glucose-Capillary: 114 mg/dL — ABNORMAL HIGH (ref 70–99)

## 2024-02-28 MED ORDER — ALLOPURINOL 100 MG PO TABS
300.0000 mg | ORAL_TABLET | Freq: Two times a day (BID) | ORAL | Status: DC
  Administered 2024-02-28 – 2024-02-29 (×3): 300 mg via ORAL
  Filled 2024-02-28 (×3): qty 3

## 2024-02-28 MED ORDER — IPRATROPIUM-ALBUTEROL 0.5-2.5 (3) MG/3ML IN SOLN
3.0000 mL | Freq: Three times a day (TID) | RESPIRATORY_TRACT | Status: DC
  Administered 2024-02-28: 3 mL via RESPIRATORY_TRACT
  Filled 2024-02-28: qty 3

## 2024-02-28 MED ORDER — PREDNISONE 20 MG PO TABS
20.0000 mg | ORAL_TABLET | Freq: Every day | ORAL | Status: DC
  Administered 2024-02-28 – 2024-02-29 (×2): 20 mg via ORAL
  Filled 2024-02-28 (×2): qty 1

## 2024-02-28 MED ORDER — LACTATED RINGERS IV SOLN: INTRAVENOUS | Status: AC

## 2024-02-28 MED ORDER — IPRATROPIUM-ALBUTEROL 0.5-2.5 (3) MG/3ML IN SOLN
3.0000 mL | Freq: Two times a day (BID) | RESPIRATORY_TRACT | Status: DC
  Administered 2024-02-29: 3 mL via RESPIRATORY_TRACT
  Filled 2024-02-28: qty 3

## 2024-02-28 MED ORDER — GUAIFENESIN-DM 100-10 MG/5ML PO SYRP
5.0000 mL | ORAL_SOLUTION | ORAL | Status: DC | PRN
  Administered 2024-02-28 – 2024-02-29 (×4): 5 mL via ORAL
  Filled 2024-02-28 (×4): qty 10

## 2024-02-28 MED ORDER — ACYCLOVIR 200 MG PO CAPS
400.0000 mg | ORAL_CAPSULE | Freq: Two times a day (BID) | ORAL | Status: DC
Start: 2024-02-28 — End: 2024-02-29
  Administered 2024-02-28 – 2024-02-29 (×3): 400 mg via ORAL
  Filled 2024-02-28 (×3): qty 2

## 2024-02-28 MED ORDER — CYCLOBENZAPRINE HCL 10 MG PO TABS: 10.0000 mg | ORAL_TABLET | Freq: Three times a day (TID) | ORAL | Status: DC | PRN

## 2024-02-28 MED ORDER — IPRATROPIUM-ALBUTEROL 0.5-2.5 (3) MG/3ML IN SOLN
3.0000 mL | Freq: Four times a day (QID) | RESPIRATORY_TRACT | Status: DC
Start: 2024-02-28 — End: 2024-02-28
  Administered 2024-02-28: 3 mL via RESPIRATORY_TRACT
  Filled 2024-02-28: qty 3

## 2024-02-28 MED ORDER — POTASSIUM CHLORIDE CRYS ER 20 MEQ PO TBCR
40.0000 meq | EXTENDED_RELEASE_TABLET | ORAL | Status: AC
  Administered 2024-02-28 (×2): 40 meq via ORAL
  Filled 2024-02-28 (×2): qty 2

## 2024-02-28 NOTE — Plan of Care (Signed)
  Problem: Clinical Measurements: Goal: Diagnostic test results will improve Outcome: Progressing   Problem: Respiratory: Goal: Ability to maintain adequate ventilation will improve Outcome: Progressing   Problem: Education: Goal: Knowledge of General Education information will improve Description: Including pain rating scale, medication(s)/side effects and non-pharmacologic comfort measures Outcome: Progressing   Problem: Health Behavior/Discharge Planning: Goal: Ability to manage health-related needs will improve Outcome: Progressing   Problem: Clinical Measurements: Goal: Ability to maintain clinical measurements within normal limits will improve Outcome: Progressing   Problem: Nutrition: Goal: Adequate nutrition will be maintained Outcome: Progressing   Problem: Coping: Goal: Level of anxiety will decrease Outcome: Progressing   Problem: Elimination: Goal: Will not experience complications related to bowel motility Outcome: Progressing   Problem: Pain Managment: Goal: General experience of comfort will improve and/or be controlled Outcome: Progressing   Problem: Safety: Goal: Ability to remain free from injury will improve Outcome: Progressing   Problem: Skin Integrity: Goal: Risk for impaired skin integrity will decrease Outcome: Progressing

## 2024-02-28 NOTE — Progress Notes (Signed)
 PROGRESS NOTE    Ruben Garcia  FMW:[id number] DOB: 04-25-1946 DOA: 02/26/2024 PCP: Myrla Jon HERO, MD    Brief Narrative:   Ruben Garcia is a 78 year old male with history of diffuse large B-cell lymphoma, stage IV, GERD, history of hypertension, who presents ED for chief concerns of weakness, fever.   He reports that he has been coughing a lot since he started chemotherapy.  He denies productive cough.  He denies chest pain, shortness of breath, abdominal pain, blood in his urine or stool.  He denies swelling of the lower extremity, nausea, vomiting, syncope.   He reports dysuria since starting on chemotherapy.  He endorses generalized appetite loss.  Assessment & Plan:   Neutropenic fever Severe sepsis with acute organ dysfunction [facility]) Source is not clear.  There is some suspicion for a viral infection.  Pan scan negative for source of infection.  Urinalysis inconsistent with infection.  Unable to exclude bacterial source in setting of immunocompromise state.  COVID, flu, RSV, RSV PL negative Plan: Continue with broad-spectrum IV antibiotics cefepime, vancomycin, metronidazole Follow cultures blood and urine, no growth to date Maintain MAP greater than 65 If patient remains stable and fever free by tomorrow we will transition to oral antibiotics and discharge home.  Leukopenia Acute on chronic anemia Per medical oncologist, no indication for Neupogen as patient received Neulasta  with chemotherapy infusion on 02/19/2024.  Patient's hemoglobin did drop to 7.  Transfuse 1 unit PRBC.  Hemoglobin responded appropriately. Plan: No further transfusion Recheck a CBC in a.m.  Cough Persistent.  No infiltrate noted on x-ray.  Possible viral syndrome. Plan: Low-dose prednisone  Daily nebs    Essential (primary) hypertension Currently low normotensive As needed IV hydralazine   Cancer, metastatic to bone (HCC) Collagen consulted   Chronic pain syndrome PDMP  reviewed Resume current narcotic prescription    DVT prophylaxis: SQH Code Status: Full Family Communication: Multiple family members at bedside 11/1, 11/2 Disposition Plan: Status is: Inpatient Remains inpatient appropriate because: Neutropenic fever   Level of care: Telemetry  Consultants:  Oncology  Procedures:  None  Antimicrobials: Vancomycin Cefepime Metronidazole   Subjective: And examined.  Resting in bed.  Feels well overall.  Objective: Vitals:   02/27/24 1603 02/27/24 1926 02/28/24 0010 02/28/24 0358  BP: 135/70 125/64 110/77 (!) 145/86  Pulse: 96 (!) 114 95 90  Resp: 18 16  18   Temp: 99.1 F (37.3 C) 98 F (36.7 C) 98.4 F (36.9 C) (!) 97.2 F (36.2 C)  TempSrc: Oral Oral Oral   SpO2: 97% 97% 100% 96%    Intake/Output Summary (Last 24 hours) at 02/28/2024 1054 Last data filed at 02/28/2024 0535 Gross per 24 hour  Intake 1982.3 ml  Output 2000 ml  Net -17.7 ml   There were no vitals filed for this visit.  Examination:  General exam: NAD Respiratory system: Scattered crackles bilaterally.  Coarse breath sounds.  Normal work of breathing.  Room air Cardiovascular system: S1-S2, RRR, no murmurs, no pedal edema Gastrointestinal system: Soft, NT/ND, bowel sounds Central nervous system: Alert and oriented. No focal neurological deficits. Extremities: Symmetric 5 x 5 power. Skin: No rashes, lesions or ulcers Psychiatry: Judgement and insight appear normal. Mood & affect appropriate.     Data Reviewed: I have personally reviewed following labs and imaging studies  CBC: Recent Labs  Lab 02/26/24 1307 02/26/24 1352 02/27/24 0456 02/28/24 0544  WBC 0.5* 0.3* 0.6*  --   NEUTROABS 0.2* 0.1* 0.3*  --  HGB 9.0* 9.4* 7.0* 8.2*  HCT 26.0* 27.4* 20.5* 23.6*  MCV 95.2 96.8 98.6  --   PLT 95* 79* 57*  --    Basic Metabolic Panel: Recent Labs  Lab 02/26/24 1307 02/26/24 1352 02/27/24 0456 02/28/24 0544  NA 126* 126* 133* 132*  K 3.9 3.9  3.5 2.9*  CL 93* 93* 100 100  CO2 22 21* 24 25  GLUCOSE 129* 143* 85 90  BUN 16 16 14  7*  CREATININE 0.95 1.09 0.71 0.45*  CALCIUM  7.8* 7.8* 7.5* 7.6*  MG 1.8  --   --   --    GFR: Estimated Creatinine Clearance: 86.2 mL/min (A) (by C-G formula based on SCr of 0.45 mg/dL (L)). Liver Function Tests: Recent Labs  Lab 02/26/24 1307 02/26/24 1352  AST 36 36  ALT 66* 69*  ALKPHOS 70 66  BILITOT 2.4* 2.5*  PROT 6.4* 6.7  ALBUMIN 3.1* 3.3*   Recent Labs  Lab 02/26/24 1352  LIPASE 17   No results for input(s): AMMONIA in the last 168 hours. Coagulation Profile: Recent Labs  Lab 02/26/24 1352  INR 1.0   Cardiac Enzymes: No results for input(s): CKTOTAL, CKMB, CKMBINDEX, TROPONINI in the last 168 hours. BNP (last 3 results) No results for input(s): PROBNP in the last 8760 hours. HbA1C: No results for input(s): HGBA1C in the last 72 hours. CBG: No results for input(s): GLUCAP in the last 168 hours. Lipid Profile: No results for input(s): CHOL, HDL, LDLCALC, TRIG, CHOLHDL, LDLDIRECT in the last 72 hours. Thyroid Function Tests: No results for input(s): TSH, T4TOTAL, FREET4, T3FREE, THYROIDAB in the last 72 hours. Anemia Panel: No results for input(s): VITAMINB12, FOLATE, FERRITIN, TIBC, IRON, RETICCTPCT in the last 72 hours. Sepsis Labs: Recent Labs  Lab 02/26/24 1352 02/26/24 1627  LATICACIDVEN 3.4* 1.7    Recent Results (from the past 240 hours)  Resp panel by RT-PCR (RSV, Flu A&B, Covid) Anterior Nasal Swab     Status: None   Collection Time: 02/26/24  1:52 PM   Specimen: Anterior Nasal Swab  Result Value Ref Range Status   SARS Coronavirus 2 by RT PCR NEGATIVE NEGATIVE Final    Comment: (NOTE) SARS-CoV-2 target nucleic acids are NOT DETECTED.  The SARS-CoV-2 RNA is generally detectable in upper respiratory specimens during the acute phase of infection. The lowest concentration of SARS-CoV-2 viral copies this  assay can detect is 138 copies/mL. A negative result does not preclude SARS-Cov-2 infection and should not be used as the sole basis for treatment or other patient management decisions. A negative result may occur with  improper specimen collection/handling, submission of specimen other than nasopharyngeal swab, presence of viral mutation(s) within the areas targeted by this assay, and inadequate number of viral copies(<138 copies/mL). A negative result must be combined with clinical observations, patient history, and epidemiological information. The expected result is Negative.  Fact Sheet for Patients:  bloggercourse.com  Fact Sheet for Healthcare Providers:  seriousbroker.it  This test is no t yet approved or cleared by the [place]  FDA and  has been authorized for detection and/or diagnosis of SARS-CoV-2 by FDA under an Emergency Use Authorization (EUA). This EUA will remain  in effect (meaning this test can be used) for the duration of the COVID-19 declaration under Section 564(b)(1) of the Act, 21 U.S.C.section 360bbb-3(b)(1), unless the authorization is terminated  or revoked sooner.       Influenza A by PCR NEGATIVE NEGATIVE Final   Influenza B by PCR NEGATIVE NEGATIVE  Final    Comment: (NOTE) The Xpert Xpress SARS-CoV-2/FLU/RSV plus assay is intended as an aid in the diagnosis of influenza from Nasopharyngeal swab specimens and should not be used as a sole basis for treatment. Nasal washings and aspirates are unacceptable for Xpert Xpress SARS-CoV-2/FLU/RSV testing.  Fact Sheet for Patients: bloggercourse.com  Fact Sheet for Healthcare Providers: seriousbroker.it  This test is not yet approved or cleared by the [place]  FDA and has been authorized for detection and/or diagnosis of SARS-CoV-2 by FDA under an Emergency Use Authorization (EUA). This EUA will  remain in effect (meaning this test can be used) for the duration of the COVID-19 declaration under Section 564(b)(1) of the Act, 21 U.S.C. section 360bbb-3(b)(1), unless the authorization is terminated or revoked.     Resp Syncytial Virus by PCR NEGATIVE NEGATIVE Final    Comment: (NOTE) Fact Sheet for Patients: bloggercourse.com  Fact Sheet for Healthcare Providers: seriousbroker.it  This test is not yet approved or cleared by the [place]  FDA and has been authorized for detection and/or diagnosis of SARS-CoV-2 by FDA under an Emergency Use Authorization (EUA). This EUA will remain in effect (meaning this test can be used) for the duration of the COVID-19 declaration under Section 564(b)(1) of the Act, 21 U.S.C. section 360bbb-3(b)(1), unless the authorization is terminated or revoked.  Performed at [facility], [address] [place], [place] [address]   Blood Culture (routine x 2)     Status: None (Preliminary result)   Collection Time: 02/26/24  1:52 PM   Specimen: BLOOD  Result Value Ref Range Status   Specimen Description BLOOD RIGHT ANTECUBITAL  Final   Special Requests   Final    BOTTLES DRAWN AEROBIC AND ANAEROBIC Blood Culture results may not be optimal due to an inadequate volume of blood received in culture bottles   Culture   Final    NO GROWTH 2 DAYS Performed at [facility], [address], [place], [place] [address]    Report Status PENDING  Incomplete  Blood Culture (routine x 2)     Status: None (Preliminary result)   Collection Time: 02/26/24  1:52 PM   Specimen: BLOOD  Result Value Ref Range Status   Specimen Description BLOOD BLOOD LEFT FOREARM  Final   Special Requests   Final    BOTTLES DRAWN AEROBIC AND ANAEROBIC Blood Culture results may not be optimal due to an inadequate volume of blood received in culture bottles   Culture   Final    NO GROWTH 2 DAYS Performed at  [facility], [address] [place], [place] [address]    Report Status PENDING  Incomplete  Respiratory (~20 pathogens) panel by PCR     Status: None   Collection Time: 02/27/24 10:20 AM   Specimen: Nasopharyngeal Swab; Respiratory  Result Value Ref Range Status   Adenovirus NOT DETECTED NOT DETECTED Final   Coronavirus 229E NOT DETECTED NOT DETECTED Final    Comment: (NOTE) The Coronavirus on the Respiratory Panel, DOES NOT test for the novel  Coronavirus (2019 nCoV)    Coronavirus HKU1 NOT DETECTED NOT DETECTED Final   Coronavirus NL63 NOT DETECTED NOT DETECTED Final   Coronavirus OC43 NOT DETECTED NOT DETECTED Final   Metapneumovirus NOT DETECTED NOT DETECTED Final   Rhinovirus / Enterovirus NOT DETECTED NOT DETECTED Final   Influenza A NOT DETECTED NOT DETECTED Final   Influenza B NOT DETECTED NOT DETECTED Final   Parainfluenza Virus 1 NOT DETECTED NOT DETECTED  Final   Parainfluenza Virus 2 NOT DETECTED NOT DETECTED Final   Parainfluenza Virus 3 NOT DETECTED NOT DETECTED Final   Parainfluenza Virus 4 NOT DETECTED NOT DETECTED Final   Respiratory Syncytial Virus NOT DETECTED NOT DETECTED Final   Bordetella pertussis NOT DETECTED NOT DETECTED Final   Bordetella Parapertussis NOT DETECTED NOT DETECTED Final   Chlamydophila pneumoniae NOT DETECTED NOT DETECTED Final   Mycoplasma pneumoniae NOT DETECTED NOT DETECTED Final    Comment: Performed at [facility], [address], [place], [place] [address]         Radiology Studies: CT CHEST ABDOMEN PELVIS W CONTRAST Result Date: 02/26/2024 EXAM: CT CHEST, ABDOMEN AND PELVIS WITH CONTRAST 02/26/2024 03:22:48 PM TECHNIQUE: CT of the chest, abdomen and pelvis was performed with the administration of 100 mL of iohexol (OMNIPAQUE) 300 MG/ML solution. Multiplanar reformatted images are provided for review. Automated exposure control, iterative reconstruction, and/or weight based adjustment of the mA/kV was utilized to  reduce the radiation dose to as low as reasonably achievable. COMPARISON: PET CT scan 01/20/2024. CLINICAL HISTORY: Sepsis. Weakness. Fever. Patient presents from cancer center. Chemotherapy ongoing. Lymphoma. FINDINGS: CHEST: MEDIASTINUM AND LYMPH NODES: Port in the anterior chest wall with tip in distal SVC. There is narrowing of the right bronchus intermedius with inflammatory thickening surrounding the bronchus. The bronchial narrowing has progressed from comparison PET CT scan. This region was hypermetabolic on comparison PET scan. Heart and pericardium are unremarkable. No mediastinal, hilar or axillary lymphadenopathy. LUNGS AND PLEURA: No evidence of pulmonary infection. No pulmonary nodularity. No focal consolidation or pulmonary edema. No pleural effusion or pneumothorax. ABDOMEN AND PELVIS: LIVER: The liver is unremarkable. GALLBLADDER AND BILE DUCTS: Post cholecystectomy. No biliary ductal dilatation. SPLEEN: No acute abnormality. PANCREAS: No acute abnormality. ADRENAL GLANDS: No acute abnormality. KIDNEYS, URETERS AND BLADDER: A solid lesion of the left kidney is again demonstrated, measuring 2.2 cm. This lesion was not hypermetabolic on comparison with a PET scan. Favor benign hemorrhagic cyst. No stones in the kidneys or ureters. No hydronephrosis. No perinephric or periureteral stranding. Urinary bladder is unremarkable. GI AND BOWEL: Stomach demonstrates no acute abnormality. No evidence of bowel inflammation or obstruction. REPRODUCTIVE ORGANS: Prostate unremarkable. PERITONEUM AND RETROPERITONEUM: No ascites. No free air. VASCULATURE: Aorta is normal in caliber. ABDOMINAL AND PELVIS LYMPH NODES: No lymphadenopathy. BONES AND SOFT TISSUES: No aggressive osseous lesion. No acute osseous abnormality. No focal soft tissue abnormality. IMPRESSION: 1. No evidence of infection 2. Progression of narrowing of the right bronchus intermedius with surrounding inflammatory thickening, previously  hypermetabolic on PET scan. 3. No evidence of metastatic disease. 4. Indeterminate 2.2 cm solid-appearing left renal lesion. Recommend attention on routine surveillance. Electronically signed by: Norleen Boxer MD 02/26/2024 04:01 PM EDT RP Workstation: [id number]   DG Chest Port 1 View Result Date: 02/26/2024 EXAM: 1 VIEW(S) XRAY OF THE CHEST 02/26/2024 02:04:00 PM COMPARISON: None available. CLINICAL HISTORY: Questionable sepsis - evaluate for abnormality FINDINGS: LINES, TUBES AND DEVICES: Right chest Port-A-Cath in place with tip overlying the expected region of the superior cavoatrial junction. LUNGS AND PLEURA: No focal pulmonary opacity. No pulmonary edema. No pleural effusion. No pneumothorax. HEART AND MEDIASTINUM: Atherosclerotic calcifications of the aortic arch. No acute abnormality of the cardiac and mediastinal silhouettes. BONES AND SOFT TISSUES: No acute osseous abnormality. IMPRESSION: 1. No acute cardiopulmonary process. Electronically signed by: Norleen Boxer MD 02/26/2024 02:43 PM EDT RP Workstation: [id number]        Scheduled Meds:  sodium chloride   Intravenous Once   benzonatate   200 mg Oral TID   Chlorhexidine  Gluconate Cloth  6 each Topical Daily   ipratropium-albuterol   3 mL Nebulization Q6H   potassium chloride  40 mEq Oral Q2H   predniSONE   20 mg Oral Q breakfast   sodium chloride  flush  10-40 mL Intracatheter Q12H   Continuous Infusions:  ceFEPime (MAXIPIME) IV Stopped (02/28/24 0535)   metronidazole Stopped (02/28/24 0345)   vancomycin Stopped (02/28/24 0020)     LOS: 2 days      Ruben KATHEE Robson, MD [facility]   If 7PM-7AM, please contact night-coverage  02/28/2024, 10:54 AM

## 2024-02-28 NOTE — Plan of Care (Signed)
  Problem: Fluid Volume: Goal: Hemodynamic stability will improve Outcome: Progressing   Problem: Education: Goal: Knowledge of General Education information will improve Description: Including pain rating scale, medication(s)/side effects and non-pharmacologic comfort measures Outcome: Progressing   Problem: Elimination: Goal: Will not experience complications related to bowel motility Outcome: Progressing Goal: Will not experience complications related to urinary retention Outcome: Progressing   Problem: Pain Managment: Goal: General experience of comfort will improve and/or be controlled Outcome: Progressing   Problem: Safety: Goal: Ability to remain free from injury will improve Outcome: Progressing   Problem: Skin Integrity: Goal: Risk for impaired skin integrity will decrease Outcome: Progressing

## 2024-02-28 NOTE — Consult Note (Signed)
 Hematology/Oncology Consult note [facility] Telephone:([phone] [phone]  Patient Care Team: Myrla Jon HERO, MD as PCP - General (Family Medicine) Verdene Gills, RN as Oncology Nurse Navigator [name], MD as Consulting Physician (Oncology)   Name of the patient: Ruben Garcia  [id number]  01/23/1946    Reason for consult: neutropenic fever   Requesting physician: Dr. 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  Cycle 4 is given on 02/18/2024.  He did receive Neulasta  support with that chemotherapy.  He was admitted to the hospital 2 days ago with severe sepsis and neutropenic fever.  Source of fever is not clearly identified.  He did undergo CT chest abdomen pelvis with contrast during admission which did not show any evidence of infection and continued response to treatment for his lymphoma.He was noted to have bronchial narrowing that was also noted on the prior PET scan which appears to be progressing and narrowing the right bronchus intermedius with inflammatory thickening surrounding the bronchus.   ECOG PS- 3  Pain scale- 0   Review of systems- Review of Systems  Constitutional:  Positive for malaise/fatigue. Negative for chills, fever and weight loss.  HENT:  Negative for congestion, ear discharge and nosebleeds.   Eyes:  Negative for blurred vision.  Respiratory:  Negative for cough, hemoptysis, sputum production, shortness of breath and wheezing.   Cardiovascular:  Negative for chest pain, palpitations, orthopnea and claudication.  Gastrointestinal:  Negative for abdominal pain, blood in stool, constipation, diarrhea, heartburn, melena, nausea and vomiting.  Genitourinary:  Negative for dysuria, flank pain, frequency, hematuria  and urgency.  Musculoskeletal:  Negative for back pain, joint pain and myalgias.  Skin:  Negative for rash.  Neurological:  Negative for dizziness, tingling, focal weakness, seizures, weakness and headaches.  Endo/Heme/Allergies:  Does not bruise/bleed easily.  Psychiatric/Behavioral:  Negative for depression and suicidal ideas. The patient does not have insomnia.     Allergies  Allergen Reactions   Codeine Nausea Only    Patient Active Problem List   Diagnosis Date Noted   Neutropenic fever 02/27/2024   Severe sepsis with acute organ dysfunction (HCC) 02/26/2024   Leukopenia 02/26/2024   Hypokalemia 02/18/2024   DLBCL (diffuse large B cell lymphoma) (HCC) 12/01/2023   Cancer, metastatic to bone (HCC) 11/27/2023   Chronic pain syndrome 11/25/2023   Lung mass 11/21/2023   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) 11/21/2023   Overweight (BMI 25.0-29.9) 11/21/2023   Anemia of chronic disease 11/21/2023   Elevated liver function tests 11/21/2023   Acute midline low back pain with bilateral sciatica 11/20/2023   Hypercalcemia 11/20/2023   Inadequate pain control 11/19/2023   Nausea 11/19/2023   Hyperglycemia 10/08/2023   Obesity 02/03/2017   Full thickness rotator cuff tear 04/08/2016   Impingement syndrome of shoulder region 04/08/2016   Enlarged prostate with lower urinary tract symptoms (LUTS) 06/26/2015   Personal history of other malignant neoplasm of skin 12/11/2014   Essential (primary) hypertension 11/29/2012   Pure hypercholesterolemia 11/29/2012     Past Medical History:  Diagnosis Date   Allergic rhinitis, cause unspecified    Basal cell carcinoma of skin, site unspecified 04/28/2010   Nasal; [place].   BPH (benign prostatic hypertrophy)    Colon polyps    Essential hypertension, benign    Hemorrhoids,  internal    Incomplete bladder emptying    Other abnormal glucose    Over weight    Personal history of colonic polyps    Personal history of other genital system  and obstetric disorders(V13.29)    Pure hypercholesterolemia    Unspecified disorder of kidney and ureter    Unspecified disorder of liver      Past Surgical History:  Procedure Laterality Date   BASAL CELL CARCINOMA EXCISION  2012   Facial        [place]   COLONOSCOPY  11/13/2010   single polyp; repeatin 5 years; [place]   COLONOSCOPY N/A 11/04/2021   Procedure: COLONOSCOPY;  Surgeon: Maryruth Ole DASEN, MD;  Location: [facility] ENDOSCOPY;  Service: Endoscopy;  Laterality: N/A;  REQUEST EARLY TIME   COLONOSCOPY WITH PROPOFOL  N/A 12/13/2015   Procedure: COLONOSCOPY WITH PROPOFOL ;  Surgeon: Lamar DASEN Viktoria, MD;  Location[facility] ENDOSCOPY;  Service: Endoscopy;  Laterality: N/A;   ear surgery  05/2012   [name].  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  Benign.   HEMORRHOID SURGERY  1978   [name]   IR IMAGING GUIDED PORT INSERTION  12/04/2023   LAPAROSCOPIC CHOLECYSTECTOMY  1982   rotator cuff surgery Right 05/14/2016   VASECTOMY  1978    Social History   Socioeconomic History   Marital status: Married    Spouse name: Heron   Number of children: 2   Years of education: 2 years college   Highest education level: Not on file  Occupational History   Occupation: retired    Comment: postal service in 2005  Tobacco Use   Smoking status: Never   Smokeless tobacco: Never  Vaping Use   Vaping status: Never Used  Substance and Sexual Activity   Alcohol use: Not Currently    Alcohol/week: 5.0 standard drinks of alcohol    Types: 5 Cans of beer per week    Comment: weekends beer, 4- 6 total Cleotilde Mallory   Drug use: No   Sexual activity: Not Currently    Partners: Female  Other Topics Concern   Not on file  Social History Narrative   Always uses seat belts. Smoke alarm and carbon monoxide detector in the home. Guns in the home stored in locked cabinet.Caffeine use: Coffee 2 servings per day, moderate amount. Exercise: Moderate walking 2 - 4 miles daily, 2 - 3 days per week.    Marital status:  Married x  47 years, happily.      Children:  2 children; 1 grandchild (20).      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      Tobacco: never      Alcohol:  Weekends; beer 4-6 per weekend.      Exercise:  Walking every other day 2.5 miles three times per day.      Seatbelt: 100%; no texting      Guns: secured guns.      Living Will: completed in 2014.  FULL CODE but no prolonged measures.        ADLs: independent with ADLs; no assistant devices   Social Drivers of Health   Financial Resource Strain: Not on file  Food Insecurity: No Food Insecurity (02/27/2024)   Hunger Vital Sign    Worried About Running Out of Food in the Last Year: Never true    Ran Out of Food in the Last Year: Never true  Transportation Needs: No Transportation Needs (02/27/2024)   PRAPARE - Transportation  Lack of Transportation (Medical): No    Lack of Transportation (Non-Medical): No  Physical Activity: Not on file  Stress: Not on file  Social Connections: Moderately Integrated (02/27/2024)   Social Connection and Isolation Panel    Frequency of Communication with Friends and Family: Twice a week    Frequency of Social Gatherings with Friends and Family: Twice a week    Attends Religious Services: Patient declined    Database Administrator or Organizations: No    Attends Engineer, Structural: 1 to 4 times per year    Marital Status: Married  Catering Manager Violence: Not At Risk (02/27/2024)   Humiliation, Afraid, Rape, and Kick questionnaire    Fear of Current or Ex-Partner: No    Emotionally Abused: No    Physically Abused: No    Sexually Abused: No     Family History  Problem Relation Age of Onset   Heart disease Mother        first MI in 69s   Diabetes Mother    Hyperlipidemia Mother    Arthritis Mother        rheumatoid   Cancer Father        lung   Diabetes Father    Arthritis Sister    Hyperlipidemia Sister    Hyperlipidemia Sister    Hypertension Sister     Hyperlipidemia Sister    Kidney disease Neg Hx    Prostate cancer Neg Hx    Colon cancer Neg Hx      Current Facility-Administered Medications:    0.9 %  sodium chloride  infusion (Manually program via Guardrails IV Fluids), , Intravenous, Once, Sreenath, Sudheer B, MD, Stopped at 02/27/24 1701   acetaminophen  (TYLENOL ) tablet 650 mg, 650 mg, Oral, Q6H PRN **OR** acetaminophen  (TYLENOL ) suppository 650 mg, 650 mg, Rectal, Q6H PRN, Cox, Amy N, DO   acyclovir  (ZOVIRAX ) 200 MG capsule 400 mg, 400 mg, Oral, BID, Sreenath, Sudheer B, MD, 400 mg at 02/28/24 1116   allopurinol  (ZYLOPRIM ) tablet 300 mg, 300 mg, Oral, BID, Sreenath, Sudheer B, MD, 300 mg at 02/28/24 1117   benzonatate  (TESSALON ) capsule 200 mg, 200 mg, Oral, TID, Sreenath, Sudheer B, MD, 200 mg at 02/28/24 1022   ceFEPIme (MAXIPIME) 2 g in sodium chloride  0.9 % 100 mL IVPB, 2 g, Intravenous, Q8H, Niels Kayla FALCON, RPH, Stopped at 02/28/24 0535   Chlorhexidine  Gluconate Cloth 2 % PADS 6 each, 6 each, Topical, Daily, Sreenath, Sudheer B, MD, 6 each at 02/28/24 1025   chlorpheniramine-HYDROcodone  (TUSSIONEX) 10-8 MG/5ML suspension 5 mL, 5 mL, Oral, QHS PRN, Cox, Amy N, DO, 5 mL at 02/27/24 2035   cyclobenzaprine  (FLEXERIL ) tablet 10 mg, 10 mg, Oral, TID PRN, Jhonny, Sudheer B, MD   guaiFENesin-dextromethorphan (ROBITUSSIN DM) 100-10 MG/5ML syrup 5 mL, 5 mL, Oral, Q4H PRN, Cox, Amy N, DO, 5 mL at 02/28/24 0548   hydrALAZINE (APRESOLINE) injection 5 mg, 5 mg, Intravenous, Q6H PRN, Cox, Amy N, DO   ipratropium-albuterol  (DUONEB) 0.5-2.5 (3) MG/3ML nebulizer solution 3 mL, 3 mL, Nebulization, Q6H, Sreenath, Sudheer B, MD   lactated ringers  infusion, , Intravenous, Continuous, Sreenath, Sudheer B, MD, Last Rate: 75 mL/hr at 02/28/24 1121, New Bag at 02/28/24 1121   metroNIDAZOLE (FLAGYL) IVPB 500 mg, 500 mg, Intravenous, Q12H, Cox, Amy N, DO, Stopped at 02/28/24 0345   ondansetron  (ZOFRAN ) tablet 4 mg, 4 mg, Oral, Q6H PRN **OR** ondansetron   (ZOFRAN ) injection 4 mg, 4 mg, Intravenous, Q6H PRN, Cox, Amy N, DO  predniSONE  (DELTASONE ) tablet 20 mg, 20 mg, Oral, Q breakfast, Sreenath, Sudheer B, MD, 20 mg at 02/28/24 1117   sodium chloride  flush (NS) 0.9 % injection 10-40 mL, 10-40 mL, Intracatheter, Q12H, Sreenath, Sudheer B, MD, 20 mL at 02/28/24 1022   sodium chloride  flush (NS) 0.9 % injection 10-40 mL, 10-40 mL, Intracatheter, PRN, Sreenath, Sudheer B, MD   vancomycin (VANCOCIN) IVPB 1000 mg/200 mL premix, 1,000 mg, Intravenous, Q12H, Sreenath, Sudheer B, MD, Last Rate: 200 mL/hr at 02/28/24 1235, 1,000 mg at 02/28/24 1235   Physical exam:  Vitals:   02/27/24 1603 02/27/24 1926 02/28/24 0010 02/28/24 0358  BP: 135/70 125/64 110/77 (!) 145/86  Pulse: 96 (!) 114 95 90  Resp: 18 16  18   Temp: 99.1 F (37.3 C) 98 F (36.7 C) 98.4 F (36.9 C) (!) 97.2 F (36.2 C)  TempSrc: Oral Oral Oral   SpO2: 97% 97% 100% 96%   Physical Exam Cardiovascular:     Rate and Rhythm: Normal rate and regular rhythm.     Heart sounds: Normal heart sounds.  Pulmonary:     Effort: Pulmonary effort is normal.     Breath sounds: Normal breath sounds.  Abdominal:     General: Bowel sounds are normal.     Palpations: Abdomen is soft.  Skin:    General: Skin is warm and dry.  Neurological:     Mental Status: He is alert and oriented to person, place, and time.           Latest Ref Rng & Units 02/28/2024    5:44 AM  CMP  Glucose 70 - 99 mg/dL 90   BUN 8 - 23 mg/dL 7   Creatinine 9.38 - 8.75 mg/dL 9.54   Sodium 864 - 854 mmol/L 132   Potassium 3.5 - 5.1 mmol/L 2.9   Chloride 98 - 111 mmol/L 100   CO2 22 - 32 mmol/L 25   Calcium  8.9 - 10.3 mg/dL 7.6       Latest Ref Rng & Units 02/28/2024    5:44 AM  CBC  Hemoglobin 13.0 - 17.0 g/dL 8.2   Hematocrit 60.9 - 52.0 % 23.6     @IMAGES @  CT CHEST ABDOMEN PELVIS W CONTRAST Result Date: 02/26/2024 EXAM: CT CHEST, ABDOMEN AND PELVIS WITH CONTRAST 02/26/2024 03:22:48 PM TECHNIQUE: CT  of the chest, abdomen and pelvis was performed with the administration of 100 mL of iohexol (OMNIPAQUE) 300 MG/ML solution. Multiplanar reformatted images are provided for review. Automated exposure control, iterative reconstruction, and/or weight based adjustment of the mA/kV was utilized to reduce the radiation dose to as low as reasonably achievable. COMPARISON: PET CT scan 01/20/2024. CLINICAL HISTORY: Sepsis. Weakness. Fever. Patient presents from cancer center. Chemotherapy ongoing. Lymphoma. FINDINGS: CHEST: MEDIASTINUM AND LYMPH NODES: Port in the anterior chest wall with tip in distal SVC. There is narrowing of the right bronchus intermedius with inflammatory thickening surrounding the bronchus. The bronchial narrowing has progressed from comparison PET CT scan. This region was hypermetabolic on comparison PET scan. Heart and pericardium are unremarkable. No mediastinal, hilar or axillary lymphadenopathy. LUNGS AND PLEURA: No evidence of pulmonary infection. No pulmonary nodularity. No focal consolidation or pulmonary edema. No pleural effusion or pneumothorax. ABDOMEN AND PELVIS: LIVER: The liver is unremarkable. GALLBLADDER AND BILE DUCTS: Post cholecystectomy. No biliary ductal dilatation. SPLEEN: No acute abnormality. PANCREAS: No acute abnormality. ADRENAL GLANDS: No acute abnormality. KIDNEYS, URETERS AND BLADDER: A solid lesion of the left kidney is again demonstrated, measuring 2.2  cm. This lesion was not hypermetabolic on comparison with a PET scan. Favor benign hemorrhagic cyst. No stones in the kidneys or ureters. No hydronephrosis. No perinephric or periureteral stranding. Urinary bladder is unremarkable. GI AND BOWEL: Stomach demonstrates no acute abnormality. No evidence of bowel inflammation or obstruction. REPRODUCTIVE ORGANS: Prostate unremarkable. PERITONEUM AND RETROPERITONEUM: No ascites. No free air. VASCULATURE: Aorta is normal in caliber. ABDOMINAL AND PELVIS LYMPH NODES: No  lymphadenopathy. BONES AND SOFT TISSUES: No aggressive osseous lesion. No acute osseous abnormality. No focal soft tissue abnormality. IMPRESSION: 1. No evidence of infection 2. Progression of narrowing of the right bronchus intermedius with surrounding inflammatory thickening, previously hypermetabolic on PET scan. 3. No evidence of metastatic disease. 4. Indeterminate 2.2 cm solid-appearing left renal lesion. Recommend attention on routine surveillance. Electronically signed by: Norleen Boxer MD 02/26/2024 04:01 PM EDT RP Workstation: [id number]   DG Chest Port 1 View Result Date: 02/26/2024 EXAM: 1 VIEW(S) XRAY OF THE CHEST 02/26/2024 02:04:00 PM COMPARISON: None available. CLINICAL HISTORY: Questionable sepsis - evaluate for abnormality FINDINGS: LINES, TUBES AND DEVICES: Right chest Port-A-Cath in place with tip overlying the expected region of the superior cavoatrial junction. LUNGS AND PLEURA: No focal pulmonary opacity. No pulmonary edema. No pleural effusion. No pneumothorax. HEART AND MEDIASTINUM: Atherosclerotic calcifications of the aortic arch. No acute abnormality of the cardiac and mediastinal silhouettes. BONES AND SOFT TISSUES: No acute osseous abnormality. IMPRESSION: 1. No acute cardiopulmonary process. Electronically signed by: Norleen Boxer MD 02/26/2024 02:43 PM EDT RP Workstation: [id number]    Assessment and plan- Patient is a 78 y.o. 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  Fever curve has come down and tachycardia has resolved.  Blood cultures have been negative and lactic acid is presently normal.  CT chest abdomen pelvis with contrast did not show any evidence of infection.  CBC was checked yesterday and white cell count was uptrending.  Recommend CBC with differential check  again tomorrow and if overall patient is clinically stable he can be discharged on oral antibiotics.  He does not require growth factor support presently since he received Neulasta  with chemotherapy.  Patient does complain of ongoing cough and he was noted to have obstruction of the right intermediate bronchus which has progressed as compared to prior PET scan and question whether there would be any role for bronchoscopy down the line once neutropenia resolves.  I will reach out to his primary oncologist Dr. Rennie regarding this    Visit Diagnosis 1. Sepsis, due to unspecified organism, unspecified whether acute organ dysfunction present (HCC)   2. Neutropenic fever     Dr. Annah Skene, MD, MPH [facility] at [facility] [phone] 02/28/2024

## 2024-02-29 DIAGNOSIS — R6520 Severe sepsis without septic shock: Secondary | ICD-10-CM | POA: Diagnosis not present

## 2024-02-29 DIAGNOSIS — A419 Sepsis, unspecified organism: Secondary | ICD-10-CM | POA: Diagnosis not present

## 2024-02-29 LAB — CBC WITH DIFFERENTIAL/PLATELET
Abs Immature Granulocytes: 0.85 K/uL — ABNORMAL HIGH (ref 0.00–0.07)
Basophils Absolute: 0.1 K/uL (ref 0.0–0.1)
Basophils Relative: 1 %
Eosinophils Absolute: 0 K/uL (ref 0.0–0.5)
Eosinophils Relative: 0 %
HCT: 25.4 % — ABNORMAL LOW (ref 39.0–52.0)
Hemoglobin: 8.4 g/dL — ABNORMAL LOW (ref 13.0–17.0)
Immature Granulocytes: 12 %
Lymphocytes Relative: 6 %
Lymphs Abs: 0.5 K/uL — ABNORMAL LOW (ref 0.7–4.0)
MCH: 31.7 pg (ref 26.0–34.0)
MCHC: 33.1 g/dL (ref 30.0–36.0)
MCV: 95.8 fL (ref 80.0–100.0)
Monocytes Absolute: 0.6 K/uL (ref 0.1–1.0)
Monocytes Relative: 8 %
Neutro Abs: 5.3 K/uL (ref 1.7–7.7)
Neutrophils Relative %: 73 %
Platelets: 109 K/uL — ABNORMAL LOW (ref 150–400)
RBC: 2.65 MIL/uL — ABNORMAL LOW (ref 4.22–5.81)
RDW: 16.1 % — ABNORMAL HIGH (ref 11.5–15.5)
Smear Review: NORMAL
WBC: 7.3 K/uL (ref 4.0–10.5)
nRBC: 0.4 % — ABNORMAL HIGH (ref 0.0–0.2)

## 2024-02-29 LAB — BASIC METABOLIC PANEL WITH GFR
Anion gap: 14 (ref 5–15)
BUN: <5 mg/dL — ABNORMAL LOW (ref 8–23)
CO2: 22 mmol/L (ref 22–32)
Calcium: 7.9 mg/dL — ABNORMAL LOW (ref 8.9–10.3)
Chloride: 100 mmol/L (ref 98–111)
Creatinine, Ser: 0.65 mg/dL (ref 0.61–1.24)
GFR, Estimated: >60 mL/min (ref >60)
Glucose, Bld: 97 mg/dL (ref 70–99)
Potassium: 3.2 mmol/L — ABNORMAL LOW (ref 3.5–5.1)
Sodium: 136 mmol/L (ref 135–145)

## 2024-02-29 MED ORDER — LOPERAMIDE HCL 2 MG PO TABS
2.0000 mg | ORAL_TABLET | Freq: Four times a day (QID) | ORAL | Status: AC | PRN
Start: 2024-02-29 — End: ?

## 2024-02-29 MED ORDER — BENZONATATE 100 MG PO CAPS
100.0000 mg | ORAL_CAPSULE | Freq: Three times a day (TID) | ORAL | 0 refills | Status: AC | PRN
Start: 2024-02-29 — End: ?

## 2024-02-29 MED ORDER — PREDNISONE 20 MG PO TABS: 40.0000 mg | ORAL_TABLET | Freq: Every day | ORAL | 0 refills | Status: DC

## 2024-02-29 MED ORDER — DOXYCYCLINE HYCLATE 50 MG PO CAPS: 50.0000 mg | ORAL_CAPSULE | Freq: Two times a day (BID) | ORAL | 0 refills | Status: DC

## 2024-02-29 MED ORDER — POTASSIUM CHLORIDE CRYS ER 20 MEQ PO TBCR
40.0000 meq | EXTENDED_RELEASE_TABLET | Freq: Once | ORAL | Status: AC
  Administered 2024-02-29: 40 meq via ORAL
  Filled 2024-02-29: qty 2

## 2024-02-29 MED ORDER — DIPHENOXYLATE-ATROPINE 2.5-0.025 MG PO TABS
1.0000 | ORAL_TABLET | Freq: Four times a day (QID) | ORAL | 1 refills | Status: AC | PRN
Start: 2024-02-29 — End: ?

## 2024-02-29 MED ORDER — AMOXICILLIN-POT CLAVULANATE 875-125 MG PO TABS: 1.0000 | ORAL_TABLET | Freq: Two times a day (BID) | ORAL | 0 refills | Status: DC

## 2024-02-29 MED ORDER — ALBUTEROL SULFATE HFA 108 (90 BASE) MCG/ACT IN AERS: 1.0000 | INHALATION_SPRAY | Freq: Four times a day (QID) | RESPIRATORY_TRACT | 0 refills | Status: AC | PRN

## 2024-02-29 MED ORDER — ALTEPLASE 2 MG IJ SOLR
2.0000 mg | Freq: Once | INTRAMUSCULAR | Status: DC
  Filled 2024-02-29: qty 2

## 2024-02-29 MED ORDER — HYDROCOD POLI-CHLORPHE POLI ER 10-8 MG/5ML PO SUER: 5.0000 mL | Freq: Every evening | ORAL | 0 refills | Status: DC | PRN

## 2024-02-29 MED ORDER — HEPARIN SOD (PORK) LOCK FLUSH 100 UNIT/ML IV SOLN
500.0000 [IU] | Freq: Once | INTRAVENOUS | Status: AC
  Administered 2024-02-29: 500 [IU] via INTRAVENOUS
  Filled 2024-02-29: qty 5

## 2024-02-29 MED ORDER — PSYLLIUM 95 % PO PACK: 1.0000 | PACK | Freq: Every day | ORAL | Status: DC

## 2024-02-29 NOTE — Plan of Care (Signed)
 Pt a&0 x4; pt c/o diarrhea, educated can be expected with antibiotic.  Problem: Clinical Measurements: Goal: Diagnostic test results will improve Outcome: Progressing   Problem: Respiratory: Goal: Ability to maintain adequate ventilation will improve Outcome: Progressing   Problem: Coping: Goal: Level of anxiety will decrease Outcome: Progressing   Problem: Pain Managment: Goal: General experience of comfort will improve and/or be controlled Outcome: Progressing   Problem: Safety: Goal: Ability to remain free from injury will improve Outcome: Progressing   Problem: Elimination: Goal: Will not experience complications related to bowel motility Outcome: Progressing

## 2024-02-29 NOTE — Discharge Summary (Signed)
 Physician Discharge Summary  Ruben Garcia FMW:[id number] DOB: [birth year] DOA: 02/26/2024  PCP: Ruben Jon HERO, MD  Admit date: 02/26/2024 Discharge date: 02/29/2024  Admitted From: Home Disposition:  Home  Recommendations for Outpatient Follow-up:  Follow up with PCP in 1-2 weeks Follow up with oncology as directed  Home Health:No  Equipment/Devices:None   Discharge Condition:Stable  CODE STATUS:FULL  Diet recommendation: Reg  Brief/Interim Summary:   Mr. Ruben Garcia is a 78 year old male with history of diffuse large B-cell lymphoma, stage IV, GERD, history of hypertension, who presents ED for chief concerns of weakness, fever.    He reports that he has been coughing a lot since he started chemotherapy.  He denies productive cough.  He denies chest pain, shortness of breath, abdominal pain, blood in his urine or stool.  He denies swelling of the lower extremity, nausea, vomiting, syncope.   He reports dysuria since starting on chemotherapy.  He endorses generalized appetite loss.    Discharge Diagnoses:  Principal Problem:   Severe sepsis with acute organ dysfunction (HCC) Active Problems:   Leukopenia   Essential (primary) hypertension   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)   Chronic pain syndrome   Cancer, metastatic to bone (HCC)   DLBCL (diffuse large B cell lymphoma) (HCC)   Neutropenic fever Neutropenic fever Severe sepsis with acute organ dysfunction [facility]) Source is not clear.  There is some suspicion for a viral infection.  Pan scan negative for source of infection.  Urinalysis inconsistent with infection.  Unable to exclude bacterial source in setting of immunocompromise state.  COVID, flu, RSV, RSV PL negative Plan: Discontinue broad-spectrum IV antibiotics.  No culture growth.  Neutropenia appears to be improved.  De-escalate to Augmentin  and Levaquin per ID pharmacy recommendations.  Stable for DC home.  Follow-up outpatient PCP and  oncology.  Leukopenia Acute on chronic anemia Per medical oncologist, no indication for Neupogen as patient received Neulasta  with chemotherapy infusion on 02/19/2024.  Patient's hemoglobin did drop to 7.  Transfuse 1 unit PRBC.  Hemoglobin responded appropriately. Plan: No indication for transfusion at time of discharge.  Follow-up outpatient oncology   Cough Persistent.  No infiltrate noted on x-ray.  Possible viral syndrome. Plan: 5-day course of prednisone .  Albuterol  MDI. Tessalon  Perles As needed Tussidex     Discharge Instructions  Discharge Instructions     Diet - low sodium heart healthy   Complete by: As directed    Increase activity slowly   Complete by: As directed       Allergies as of 02/29/2024       Reactions   Codeine Nausea Only        Medication List     PAUSE taking these medications    predniSONE  50 MG tablet Wait to take this until your doctor or other care provider tells you to start again. Commonly known as: DELTASONE  Take 2 tablets once day x 5 days. START on day of your chemotherapy. Take in AM; with FOOD. You also have another medication with the same name that you may need to continue taking.       STOP taking these medications    lidocaine -prilocaine  cream Commonly known as: EMLA    pantoprazole  20 MG tablet Commonly known as: Protonix        TAKE these medications    acetaminophen  325 MG tablet Commonly known as: TYLENOL  Take 2 tablets (650 mg total) by mouth every 6 (six) hours as needed for mild pain (pain score 1-3), fever, headache  or moderate pain (pain score 4-6) (or Fever >/= 101).   acyclovir  400 MG tablet Commonly known as: ZOVIRAX  Take 1 tablet (400 mg total) by mouth 2 (two) times daily. [to prevent shingles]   albuterol  108 (90 Base) MCG/ACT inhaler Commonly known as: VENTOLIN  HFA Inhale 1-2 puffs into the lungs every 6 (six) hours as needed.   allopurinol  300 MG tablet Commonly known as: ZYLOPRIM  Take  1 tablet (300 mg total) by mouth 2 (two) times daily.   amoxicillin -clavulanate 875-125 MG tablet Commonly known as: AUGMENTIN  Take 1 tablet by mouth 2 (two) times daily for 7 days.   benzonatate  100 MG capsule Commonly known as: TESSALON  Take 1-2 capsules (100-200 mg total) by mouth 3 (three) times daily as needed for cough.   chlorpheniramine-HYDROcodone  10-8 MG/5ML Commonly known as: TUSSIONEX Take 5 mLs by mouth at bedtime as needed for cough.   cyclobenzaprine  10 MG tablet Commonly known as: FLEXERIL  Take 1 tablet (10 mg total) by mouth 3 (three) times daily as needed for muscle spasms.   diphenoxylate-atropine 2.5-0.025 MG tablet Commonly known as: LOMOTIL Take 1 tablet by mouth 4 (four) times daily as needed for diarrhea or loose stools. Diarrhea not responding to imodium What changed: additional instructions   doxycycline  50 MG capsule Commonly known as: VIBRAMYCIN  Take 1 capsule (50 mg total) by mouth 2 (two) times daily for 7 days.   lactulose  10 GM/15ML solution Commonly known as: CHRONULAC  Take 15 mLs (10 g total) by mouth daily as needed for mild constipation.   loperamide 2 MG tablet Commonly known as: Imodium A-D Take 1 tablet (2 mg total) by mouth 4 (four) times daily as needed for diarrhea or loose stools.   nystatin  100000 UNIT/ML suspension Commonly known as: MYCOSTATIN  Take 5 mLs (500,000 Units total) by mouth 4 (four) times daily.   ondansetron  4 MG tablet Commonly known as: Zofran  Take 1 tablet (4 mg total) by mouth every 8 (eight) hours as needed for nausea or vomiting.   potassium chloride SA 20 MEQ tablet Commonly known as: KLOR-CON M Take 1 tablet (20 mEq total) by mouth daily.   predniSONE  20 MG tablet Commonly known as: DELTASONE  Take 2 tablets (40 mg total) by mouth daily with breakfast for 4 days. Start taking on: March 01, 2024 What changed: Another medication with the same name was paused. Ask your nurse or doctor if you should take  this medication.   prochlorperazine  10 MG tablet Commonly known as: COMPAZINE  Take 1 tablet (10 mg total) by mouth every 6 (six) hours as needed for nausea or vomiting.   psyllium 95 % Pack Commonly known as: HYDROCIL/METAMUCIL Take 1 packet by mouth daily.   traMADol  50 MG tablet Commonly known as: ULTRAM  Take 1 tablet (50 mg total) by mouth every 6 (six) hours as needed.        Follow-up Information     [name], Jon HERO, MD Follow up.   Specialty: Family Medicine Why: Hospital follow up Contact information: [address] [place] [address] [place] [address] [phone]                Allergies  Allergen Reactions   Codeine Nausea Only    Consultations: Oncology   Procedures/Studies: CT CHEST ABDOMEN PELVIS W CONTRAST Result Date: 02/26/2024 EXAM: CT CHEST, ABDOMEN AND PELVIS WITH CONTRAST 02/26/2024 03:22:48 PM TECHNIQUE: CT of the chest, abdomen and pelvis was performed with the administration of 100 mL of iohexol (OMNIPAQUE) 300 MG/ML solution. Multiplanar reformatted images  are provided for review. Automated exposure control, iterative reconstruction, and/or weight based adjustment of the mA/kV was utilized to reduce the radiation dose to as low as reasonably achievable. COMPARISON: PET CT scan 01/20/2024. CLINICAL HISTORY: Sepsis. Weakness. Fever. Patient presents from cancer center. Chemotherapy ongoing. Lymphoma. FINDINGS: CHEST: MEDIASTINUM AND LYMPH NODES: Port in the anterior chest wall with tip in distal SVC. There is narrowing of the right bronchus intermedius with inflammatory thickening surrounding the bronchus. The bronchial narrowing has progressed from comparison PET CT scan. This region was hypermetabolic on comparison PET scan. Heart and pericardium are unremarkable. No mediastinal, hilar or axillary lymphadenopathy. LUNGS AND PLEURA: No evidence of pulmonary infection. No pulmonary nodularity. No focal consolidation or pulmonary edema. No  pleural effusion or pneumothorax. ABDOMEN AND PELVIS: LIVER: The liver is unremarkable. GALLBLADDER AND BILE DUCTS: Post cholecystectomy. No biliary ductal dilatation. SPLEEN: No acute abnormality. PANCREAS: No acute abnormality. ADRENAL GLANDS: No acute abnormality. KIDNEYS, URETERS AND BLADDER: A solid lesion of the left kidney is again demonstrated, measuring 2.2 cm. This lesion was not hypermetabolic on comparison with a PET scan. Favor benign hemorrhagic cyst. No stones in the kidneys or ureters. No hydronephrosis. No perinephric or periureteral stranding. Urinary bladder is unremarkable. GI AND BOWEL: Stomach demonstrates no acute abnormality. No evidence of bowel inflammation or obstruction. REPRODUCTIVE ORGANS: Prostate unremarkable. PERITONEUM AND RETROPERITONEUM: No ascites. No free air. VASCULATURE: Aorta is normal in caliber. ABDOMINAL AND PELVIS LYMPH NODES: No lymphadenopathy. BONES AND SOFT TISSUES: No aggressive osseous lesion. No acute osseous abnormality. No focal soft tissue abnormality. IMPRESSION: 1. No evidence of infection 2. Progression of narrowing of the right bronchus intermedius with surrounding inflammatory thickening, previously hypermetabolic on PET scan. 3. No evidence of metastatic disease. 4. Indeterminate 2.2 cm solid-appearing left renal lesion. Recommend attention on routine surveillance. Electronically signed by: Norleen Boxer MD 02/26/2024 04:01 PM EDT RP Workstation: [id number]   DG Chest Port 1 View Result Date: 02/26/2024 EXAM: 1 VIEW(S) XRAY OF THE CHEST 02/26/2024 02:04:00 PM COMPARISON: None available. CLINICAL HISTORY: Questionable sepsis - evaluate for abnormality FINDINGS: LINES, TUBES AND DEVICES: Right chest Port-A-Cath in place with tip overlying the expected region of the superior cavoatrial junction. LUNGS AND PLEURA: No focal pulmonary opacity. No pulmonary edema. No pleural effusion. No pneumothorax. HEART AND MEDIASTINUM: Atherosclerotic calcifications of  the aortic arch. No acute abnormality of the cardiac and mediastinal silhouettes. BONES AND SOFT TISSUES: No acute osseous abnormality. IMPRESSION: 1. No acute cardiopulmonary process. Electronically signed by: Norleen Boxer MD 02/26/2024 02:43 PM EDT RP Workstation: [id number]      Subjective: Seen and examined the day of discharge.  Stable no distress.  Appropriate for discharge home.  Discharge Exam: Vitals:   02/29/24 0700 02/29/24 0803  BP:  (!) 170/85  Pulse:  (!) 107  Resp:  18  Temp:  97.7 F (36.5 C)  SpO2: 98% 95%   Vitals:   02/28/24 2100 02/29/24 0417 02/29/24 0700 02/29/24 0803  BP:  (!) 165/94  (!) 170/85  Pulse:  (!) 101  (!) 107  Resp:  16  18  Temp: 97.6 F (36.4 C) 97.9 F (36.6 C)  97.7 F (36.5 C)  TempSrc: Oral     SpO2:  94% 98% 95%  Weight:      Height:        General: Pt is alert, awake, not in acute distress Cardiovascular: RRR, S1/S2 +, no rubs, no gallops Respiratory: CTA bilaterally, no wheezing, no rhonchi Abdominal: Soft, NT, ND, bowel  sounds + Extremities: no edema, no cyanosis    The results of significant diagnostics from this hospitalization (including imaging, microbiology, ancillary and laboratory) are listed below for reference.     Microbiology: Recent Results (from the past 240 hours)  Resp panel by RT-PCR (RSV, Flu A&B, Covid) Anterior Nasal Swab     Status: None   Collection Time: 02/26/24  1:52 PM   Specimen: Anterior Nasal Swab  Result Value Ref Range Status   SARS Coronavirus 2 by RT PCR NEGATIVE NEGATIVE Final    Comment: (NOTE) SARS-CoV-2 target nucleic acids are NOT DETECTED.  The SARS-CoV-2 RNA is generally detectable in upper respiratory specimens during the acute phase of infection. The lowest concentration of SARS-CoV-2 viral copies this assay can detect is 138 copies/mL. A negative result does not preclude SARS-Cov-2 infection and should not be used as the sole basis for treatment or other patient management  decisions. A negative result may occur with  improper specimen collection/handling, submission of specimen other than nasopharyngeal swab, presence of viral mutation(s) within the areas targeted by this assay, and inadequate number of viral copies(<138 copies/mL). A negative result must be combined with clinical observations, patient history, and epidemiological information. The expected result is Negative.  Fact Sheet for Patients:  bloggercourse.com  Fact Sheet for Healthcare Providers:  seriousbroker.it  This test is no t yet approved or cleared by the [place]  FDA and  has been authorized for detection and/or diagnosis of SARS-CoV-2 by FDA under an Emergency Use Authorization (EUA). This EUA will remain  in effect (meaning this test can be used) for the duration of the COVID-19 declaration under Section 564(b)(1) of the Act, 21 U.S.C.section 360bbb-3(b)(1), unless the authorization is terminated  or revoked sooner.       Influenza A by PCR NEGATIVE NEGATIVE Final   Influenza B by PCR NEGATIVE NEGATIVE Final    Comment: (NOTE) The Xpert Xpress SARS-CoV-2/FLU/RSV plus assay is intended as an aid in the diagnosis of influenza from Nasopharyngeal swab specimens and should not be used as a sole basis for treatment. Nasal washings and aspirates are unacceptable for Xpert Xpress SARS-CoV-2/FLU/RSV testing.  Fact Sheet for Patients: bloggercourse.com  Fact Sheet for Healthcare Providers: seriousbroker.it  This test is not yet approved or cleared by the [place]  FDA and has been authorized for detection and/or diagnosis of SARS-CoV-2 by FDA under an Emergency Use Authorization (EUA). This EUA will remain in effect (meaning this test can be used) for the duration of the COVID-19 declaration under Section 564(b)(1) of the Act, 21 U.S.C. section 360bbb-3(b)(1), unless the  authorization is terminated or revoked.     Resp Syncytial Virus by PCR NEGATIVE NEGATIVE Final    Comment: (NOTE) Fact Sheet for Patients: bloggercourse.com  Fact Sheet for Healthcare Providers: seriousbroker.it  This test is not yet approved or cleared by the [place]  FDA and has been authorized for detection and/or diagnosis of SARS-CoV-2 by FDA under an Emergency Use Authorization (EUA). This EUA will remain in effect (meaning this test can be used) for the duration of the COVID-19 declaration under Section 564(b)(1) of the Act, 21 U.S.C. section 360bbb-3(b)(1), unless the authorization is terminated or revoked.  Performed at [facility], [address] [place], [place] [address]   Blood Culture (routine x 2)     Status: None (Preliminary result)   Collection Time: 02/26/24  1:52 PM   Specimen: BLOOD  Result Value Ref Range Status   Specimen Description BLOOD RIGHT ANTECUBITAL  Final   Special Requests   Final    BOTTLES DRAWN AEROBIC AND ANAEROBIC Blood Culture results may not be optimal due to an inadequate volume of blood received in culture bottles   Culture   Final    NO GROWTH 3 DAYS Performed at [facility], [address] [place], [place] [address]    Report Status PENDING  Incomplete  Blood Culture (routine x 2)     Status: None (Preliminary result)   Collection Time: 02/26/24  1:52 PM   Specimen: BLOOD  Result Value Ref Range Status   Specimen Description BLOOD BLOOD LEFT FOREARM  Final   Special Requests   Final    BOTTLES DRAWN AEROBIC AND ANAEROBIC Blood Culture results may not be optimal due to an inadequate volume of blood received in culture bottles   Culture   Final    NO GROWTH 3 DAYS Performed at [facility], [address], [place], [place] [address]    Report Status PENDING  Incomplete  Respiratory (~20 pathogens) panel by PCR     Status: None   Collection  Time: 02/27/24 10:20 AM   Specimen: Nasopharyngeal Swab; Respiratory  Result Value Ref Range Status   Adenovirus NOT DETECTED NOT DETECTED Final   Coronavirus 229E NOT DETECTED NOT DETECTED Final    Comment: (NOTE) The Coronavirus on the Respiratory Panel, DOES NOT test for the novel  Coronavirus (2019 nCoV)    Coronavirus HKU1 NOT DETECTED NOT DETECTED Final   Coronavirus NL63 NOT DETECTED NOT DETECTED Final   Coronavirus OC43 NOT DETECTED NOT DETECTED Final   Metapneumovirus NOT DETECTED NOT DETECTED Final   Rhinovirus / Enterovirus NOT DETECTED NOT DETECTED Final   Influenza A NOT DETECTED NOT DETECTED Final   Influenza B NOT DETECTED NOT DETECTED Final   Parainfluenza Virus 1 NOT DETECTED NOT DETECTED Final   Parainfluenza Virus 2 NOT DETECTED NOT DETECTED Final   Parainfluenza Virus 3 NOT DETECTED NOT DETECTED Final   Parainfluenza Virus 4 NOT DETECTED NOT DETECTED Final   Respiratory Syncytial Virus NOT DETECTED NOT DETECTED Final   Bordetella pertussis NOT DETECTED NOT DETECTED Final   Bordetella Parapertussis NOT DETECTED NOT DETECTED Final   Chlamydophila pneumoniae NOT DETECTED NOT DETECTED Final   Mycoplasma pneumoniae NOT DETECTED NOT DETECTED Final    Comment: Performed at [facility], [address], [place], [place] [address]     Labs: BNP (last 3 results) No results for input(s): BNP in the last 8760 hours. Basic Metabolic Panel: Recent Labs  Lab 02/26/24 1307 02/26/24 1352 02/27/24 0456 02/28/24 0544 02/29/24 0747  NA 126* 126* 133* 132* 136  K 3.9 3.9 3.5 2.9* 3.2*  CL 93* 93* 100 100 100  CO2 22 21* 24 25 22   GLUCOSE 129* 143* 85 90 97  BUN 16 16 14  7* <5*  CREATININE 0.95 1.09 0.71 0.45* 0.65  CALCIUM  7.8* 7.8* 7.5* 7.6* 7.9*  MG 1.8  --   --   --   --    Liver Function Tests: Recent Labs  Lab 02/26/24 1307 02/26/24 1352  AST 36 36  ALT 66* 69*  ALKPHOS 70 66  BILITOT 2.4* 2.5*  PROT 6.4* 6.7  ALBUMIN 3.1* 3.3*   Recent Labs   Lab 02/26/24 1352  LIPASE 17   No results for input(s): AMMONIA in the last 168 hours. CBC: Recent Labs  Lab 02/26/24 1307 02/26/24 1352 02/27/24 0456 02/28/24 0544 02/29/24 0747  WBC 0.5* 0.3*  0.6*  --  7.3  NEUTROABS 0.2* 0.1* 0.3*  --  5.3  HGB 9.0* 9.4* 7.0* 8.2* 8.4*  HCT 26.0* 27.4* 20.5* 23.6* 25.4*  MCV 95.2 96.8 98.6  --  95.8  PLT 95* 79* 57*  --  109*   Cardiac Enzymes: No results for input(s): CKTOTAL, CKMB, CKMBINDEX, TROPONINI in the last 168 hours. BNP: Invalid input(s): POCBNP CBG: Recent Labs  Lab 02/28/24 1216  GLUCAP 114*   D-Dimer No results for input(s): DDIMER in the last 72 hours. Hgb A1c No results for input(s): HGBA1C in the last 72 hours. Lipid Profile No results for input(s): CHOL, HDL, LDLCALC, TRIG, CHOLHDL, LDLDIRECT in the last 72 hours. Thyroid function studies No results for input(s): TSH, T4TOTAL, T3FREE, THYROIDAB in the last 72 hours.  Invalid input(s): FREET3 Anemia work up No results for input(s): VITAMINB12, FOLATE, FERRITIN, TIBC, IRON, RETICCTPCT in the last 72 hours. Urinalysis    Component Value Date/Time   COLORURINE YELLOW (A) 02/26/2024 1627   APPEARANCEUR CLEAR (A) 02/26/2024 1627   APPEARANCEUR Clear 09/13/2019 0900   LABSPEC 1.030 02/26/2024 1627   PHURINE 5.0 02/26/2024 1627   GLUCOSEU NEGATIVE 02/26/2024 1627   HGBUR NEGATIVE 02/26/2024 1627   BILIRUBINUR NEGATIVE 02/26/2024 1627   BILIRUBINUR Negative 09/13/2019 0900   KETONESUR NEGATIVE 02/26/2024 1627   PROTEINUR NEGATIVE 02/26/2024 1627   UROBILINOGEN 0.2 12/11/2015 1417   NITRITE NEGATIVE 02/26/2024 1627   LEUKOCYTESUR NEGATIVE 02/26/2024 1627   Sepsis Labs Recent Labs  Lab 02/26/24 1307 02/26/24 1352 02/27/24 0456 02/29/24 0747  WBC 0.5* 0.3* 0.6* 7.3   Microbiology Recent Results (from the past 240 hours)  Resp panel by RT-PCR (RSV, Flu A&B, Covid) Anterior Nasal Swab     Status: None    Collection Time: 02/26/24  1:52 PM   Specimen: Anterior Nasal Swab  Result Value Ref Range Status   SARS Coronavirus 2 by RT PCR NEGATIVE NEGATIVE Final    Comment: (NOTE) SARS-CoV-2 target nucleic acids are NOT DETECTED.  The SARS-CoV-2 RNA is generally detectable in upper respiratory specimens during the acute phase of infection. The lowest concentration of SARS-CoV-2 viral copies this assay can detect is 138 copies/mL. A negative result does not preclude SARS-Cov-2 infection and should not be used as the sole basis for treatment or other patient management decisions. A negative result may occur with  improper specimen collection/handling, submission of specimen other than nasopharyngeal swab, presence of viral mutation(s) within the areas targeted by this assay, and inadequate number of viral copies(<138 copies/mL). A negative result must be combined with clinical observations, patient history, and epidemiological information. The expected result is Negative.  Fact Sheet for Patients:  bloggercourse.com  Fact Sheet for Healthcare Providers:  seriousbroker.it  This test is no t yet approved or cleared by the [place]  FDA and  has been authorized for detection and/or diagnosis of SARS-CoV-2 by FDA under an Emergency Use Authorization (EUA). This EUA will remain  in effect (meaning this test can be used) for the duration of the COVID-19 declaration under Section 564(b)(1) of the Act, 21 U.S.C.section 360bbb-3(b)(1), unless the authorization is terminated  or revoked sooner.       Influenza A by PCR NEGATIVE NEGATIVE Final   Influenza B by PCR NEGATIVE NEGATIVE Final    Comment: (NOTE) The Xpert Xpress SARS-CoV-2/FLU/RSV plus assay is intended as an aid in the diagnosis of influenza from Nasopharyngeal swab specimens and should not be used as a sole basis for treatment. Nasal washings  and aspirates are unacceptable for  Xpert Xpress SARS-CoV-2/FLU/RSV testing.  Fact Sheet for Patients: bloggercourse.com  Fact Sheet for Healthcare Providers: seriousbroker.it  This test is not yet approved or cleared by the [place]  FDA and has been authorized for detection and/or diagnosis of SARS-CoV-2 by FDA under an Emergency Use Authorization (EUA). This EUA will remain in effect (meaning this test can be used) for the duration of the COVID-19 declaration under Section 564(b)(1) of the Act, 21 U.S.C. section 360bbb-3(b)(1), unless the authorization is terminated or revoked.     Resp Syncytial Virus by PCR NEGATIVE NEGATIVE Final    Comment: (NOTE) Fact Sheet for Patients: bloggercourse.com  Fact Sheet for Healthcare Providers: seriousbroker.it  This test is not yet approved or cleared by the [place]  FDA and has been authorized for detection and/or diagnosis of SARS-CoV-2 by FDA under an Emergency Use Authorization (EUA). This EUA will remain in effect (meaning this test can be used) for the duration of the COVID-19 declaration under Section 564(b)(1) of the Act, 21 U.S.C. section 360bbb-3(b)(1), unless the authorization is terminated or revoked.  Performed at [facility], [address] [place], [place] [address]   Blood Culture (routine x 2)     Status: None (Preliminary result)   Collection Time: 02/26/24  1:52 PM   Specimen: BLOOD  Result Value Ref Range Status   Specimen Description BLOOD RIGHT ANTECUBITAL  Final   Special Requests   Final    BOTTLES DRAWN AEROBIC AND ANAEROBIC Blood Culture results may not be optimal due to an inadequate volume of blood received in culture bottles   Culture   Final    NO GROWTH 3 DAYS Performed at [facility], [address] [place], [place] [address]    Report Status PENDING  Incomplete  Blood Culture (routine x 2)     Status:  None (Preliminary result)   Collection Time: 02/26/24  1:52 PM   Specimen: BLOOD  Result Value Ref Range Status   Specimen Description BLOOD BLOOD LEFT FOREARM  Final   Special Requests   Final    BOTTLES DRAWN AEROBIC AND ANAEROBIC Blood Culture results may not be optimal due to an inadequate volume of blood received in culture bottles   Culture   Final    NO GROWTH 3 DAYS Performed at [facility], [address] [place], [place] [address]    Report Status PENDING  Incomplete  Respiratory (~20 pathogens) panel by PCR     Status: None   Collection Time: 02/27/24 10:20 AM   Specimen: Nasopharyngeal Swab; Respiratory  Result Value Ref Range Status   Adenovirus NOT DETECTED NOT DETECTED Final   Coronavirus 229E NOT DETECTED NOT DETECTED Final    Comment: (NOTE) The Coronavirus on the Respiratory Panel, DOES NOT test for the novel  Coronavirus (2019 nCoV)    Coronavirus HKU1 NOT DETECTED NOT DETECTED Final   Coronavirus NL63 NOT DETECTED NOT DETECTED Final   Coronavirus OC43 NOT DETECTED NOT DETECTED Final   Metapneumovirus NOT DETECTED NOT DETECTED Final   Rhinovirus / Enterovirus NOT DETECTED NOT DETECTED Final   Influenza A NOT DETECTED NOT DETECTED Final   Influenza B NOT DETECTED NOT DETECTED Final   Parainfluenza Virus 1 NOT DETECTED NOT DETECTED Final   Parainfluenza Virus 2 NOT DETECTED NOT DETECTED Final   Parainfluenza Virus 3 NOT DETECTED NOT DETECTED Final   Parainfluenza Virus 4 NOT DETECTED NOT DETECTED Final   Respiratory Syncytial Virus NOT DETECTED NOT  DETECTED Final   Bordetella pertussis NOT DETECTED NOT DETECTED Final   Bordetella Parapertussis NOT DETECTED NOT DETECTED Final   Chlamydophila pneumoniae NOT DETECTED NOT DETECTED Final   Mycoplasma pneumoniae NOT DETECTED NOT DETECTED Final    Comment: Performed at [facility], [address] [place], [place] [address]     Time coordinating discharge: 40 minutes  SIGNED:   Calvin KATHEE Robson, MD  [facility] 02/29/2024, 11:24 AM Pager   If 7PM-7AM, please contact night-coverage

## 2024-03-02 ENCOUNTER — Other Ambulatory Visit: Payer: Self-pay | Admitting: Family Medicine

## 2024-03-02 ENCOUNTER — Telehealth: Payer: Self-pay

## 2024-03-02 DIAGNOSIS — I10 Essential (primary) hypertension: Secondary | ICD-10-CM

## 2024-03-02 LAB — CULTURE, BLOOD (ROUTINE X 2)
Culture: NO GROWTH
Culture: NO GROWTH

## 2024-03-02 NOTE — Transitions of Care (Post Inpatient/ED Visit) (Signed)
   03/02/2024  Name: Ruben Garcia MRN: [id number] DOB: 1946/02/19  Today's TOC FU Call Status: Today's TOC FU Call Status:: Unsuccessful Call (1st Attempt) Unsuccessful Call (1st Attempt) Date: 03/02/24  Attempted to reach the patient regarding the most recent Inpatient/ED visit.  Left a HIPAA approved voicemail message to phone number provided in demographics per DPR.    Follow Up Plan: Additional outreach attempts will be made to reach the patient to complete the Transitions of Care (Post Inpatient/ED visit) call.   Richerd Fish, RN, BSN, CCM [facility], [facility] Management Coordinator Direct Dial: [phone]

## 2024-03-03 ENCOUNTER — Encounter: Payer: Self-pay | Admitting: Internal Medicine

## 2024-03-03 ENCOUNTER — Telehealth: Payer: Self-pay

## 2024-03-03 NOTE — Transitions of Care (Post Inpatient/ED Visit) (Signed)
   03/03/2024  Name: RC AMISON MRN: [id number] DOB: August 10, 1945  Today's TOC FU Call Status: Today's TOC FU Call Status:: Unsuccessful Call (2nd Attempt) Unsuccessful Call (2nd Attempt) Date: 03/03/24  Attempted to reach the patient regarding the most recent Inpatient/ED visit. Left a HIPAA approved voicemail message to phone number provided in demographics per DPR.    Follow Up Plan: Additional outreach attempts will be made to reach the patient to complete the Transitions of Care (Post Inpatient/ED visit) call.   Richerd Fish, RN, BSN, CCM [facility], [facility] Management Coordinator Direct Dial: [phone]

## 2024-03-04 ENCOUNTER — Other Ambulatory Visit: Payer: Self-pay | Admitting: Internal Medicine

## 2024-03-04 ENCOUNTER — Encounter: Payer: Self-pay | Admitting: Internal Medicine

## 2024-03-04 ENCOUNTER — Telehealth: Payer: Self-pay

## 2024-03-04 ENCOUNTER — Telehealth: Payer: Self-pay | Admitting: *Deleted

## 2024-03-04 NOTE — Transitions of Care (Post Inpatient/ED Visit) (Signed)
 03/04/2024  Name: Ruben Garcia MRN: [id number] DOB: 01-Jun-1945  Today's TOC FU Call Status: Today's TOC FU Call Status:: Successful TOC FU Call Completed TOC FU Call Complete Date: 03/04/24 Patient's Name and Date of Birth confirmed.  Transition Care Management Follow-up Telephone Call Date of Discharge: 02/29/24 Discharge Facility: [facility]) Type of Discharge: Inpatient Admission Primary Inpatient Discharge Diagnosis:: Sepsis, neutropenic fever How have you been since you were released from the hospital?: Better Any questions or concerns?: No  Items Reviewed: Did you receive and understand the discharge instructions provided?: Yes Medications obtained,verified, and reconciled?: Yes (Medications Reviewed) Any new allergies since your discharge?: No Dietary orders reviewed?: Yes Type of Diet Ordered:: Heart healthy Do you have support at home?: Yes People in Home [RPT]: spouse Name of Support/Comfort Primary Source: [place]  Medications Reviewed Today: Medications Reviewed Today     Reviewed by Venita Seng, RN (Case Manager) on 03/04/24 at 1452  Med List Status: <None>   Medication Order Taking? Sig Documenting Provider Last Dose Status Informant  acetaminophen  (TYLENOL ) 325 MG tablet [id number] Yes Take 2 tablets (650 mg total) by mouth every 6 (six) hours as needed for mild pain (pain score 1-3), fever, headache or moderate pain (pain score 4-6) (or Fever >/= 101). Josette Ade, MD  Active Spouse/Significant Other, Self, Pharmacy Records           Med Note NIKKI, FLORIDA   Dju Feb 27, 2024 10:49 AM) prn  acyclovir  (ZOVIRAX ) 400 MG tablet [id number] Yes Take 1 tablet (400 mg total) by mouth 2 (two) times daily. [to prevent shingles] Brahmanday, Govinda R, MD  Active Spouse/Significant Other, Self, Pharmacy Records  albuterol  (VENTOLIN  HFA) 108 (90 Base) MCG/ACT inhaler [id number] Yes Inhale 1-2 puffs into the lungs every 6 (six) hours as  needed. Jhonny Calvin NOVAK, MD  Active   allopurinol  (ZYLOPRIM ) 300 MG tablet [id number] Yes Take 1 tablet (300 mg total) by mouth 2 (two) times daily. Brahmanday, Govinda R, MD  Active Spouse/Significant Other, Self, Pharmacy Records  amoxicillin -clavulanate (AUGMENTIN ) 875-125 MG tablet [id number] Yes Take 1 tablet by mouth 2 (two) times daily for 7 days. Jhonny Calvin NOVAK, MD  Active   benzonatate  (TESSALON ) 100 MG capsule [id number] Yes Take 1-2 capsules (100-200 mg total) by mouth 3 (three) times daily as needed for cough. Jhonny Calvin NOVAK, MD  Active   chlorpheniramine-HYDROcodone  (TUSSIONEX) 10-8 MG/5ML [id number] Yes Take 5 mLs by mouth at bedtime as needed for cough. Jhonny Calvin NOVAK, MD  Active   cyclobenzaprine  (FLEXERIL ) 10 MG tablet [id number] Yes Take 1 tablet (10 mg total) by mouth 3 (three) times daily as needed for muscle spasms. Gasper Nancyann BRAVO, MD  Active Self, Spouse/Significant Other, Pharmacy Records           Med Note NIKKI, MARIA   Thu Nov 19, 2023  3:31 PM) prn  diphenoxylate-atropine (LOMOTIL) 2.5-0.025 MG tablet [id number] Yes Take 1 tablet by mouth 4 (four) times daily as needed for diarrhea or loose stools. Diarrhea not responding to imodium Sreenath, Sudheer B, MD  Active   doxycycline  (VIBRAMYCIN ) 50 MG capsule [id number] Yes Take 1 capsule (50 mg total) by mouth 2 (two) times daily for 7 days. Jhonny Calvin NOVAK, MD  Active   lactulose  (CHRONULAC ) 10 GM/15ML solution [id number] Yes Take 15 mLs (10 g total) by mouth daily as needed for mild constipation. [name], MD  Active Spouse/Significant Other, Self, Pharmacy Records  Med Note NIKKI, MARIA   Sat Feb 27, 2024 10:48 AM) prn  loperamide (IMODIUM A-D) 2 MG tablet [id number] Yes Take 1 tablet (2 mg total) by mouth 4 (four) times daily as needed for diarrhea or loose stools. Jhonny Calvin NOVAK, MD  Active   nystatin  (MYCOSTATIN ) 100000 UNIT/ML suspension [id number] Yes Take 5 mLs (500,000  Units total) by mouth 4 (four) times daily. Babara Call, MD  Active Spouse/Significant Other, Self, Pharmacy Records  ondansetron  (ZOFRAN ) 4 MG tablet [id number] Yes Take 1 tablet (4 mg total) by mouth every 8 (eight) hours as needed for nausea or vomiting. Josette Ade, MD  Active Spouse/Significant Other, Self, Pharmacy Records           Med Note NIKKI, FLORIDA   Dju Feb 27, 2024 10:49 AM) prn  potassium chloride SA (KLOR-CON M) 20 MEQ tablet [id number] Yes Take 1 tablet (20 mEq total) by mouth daily. Babara Call, MD  Active Spouse/Significant Other, Self, Pharmacy Records  predniSONE  (DELTASONE ) 20 MG tablet [id number] Yes Take 2 tablets (40 mg total) by mouth daily with breakfast for 4 days. Jhonny Calvin NOVAK, MD  Active   predniSONE  (DELTASONE ) 50 MG tablet [id number] Yes Take 2 tablets once day x 5 days. START on day of your chemotherapy. Take in AM; with FOOD. Brahmanday, Govinda R, MD  Active Spouse/Significant Other, Self, Pharmacy Records  prochlorperazine  (COMPAZINE ) 10 MG tablet [id number] Yes Take 1 tablet (10 mg total) by mouth every 6 (six) hours as needed for nausea or vomiting. [name], MD  Active Spouse/Significant Other, Self, Pharmacy Records           Med Note NIKKI, FLORIDA   Dju Feb 27, 2024 10:48 AM) prn  psyllium (HYDROCIL/METAMUCIL) 95 % PACK [id number] Yes Take 1 packet by mouth daily. Jhonny Calvin NOVAK, MD  Active   traMADol  (ULTRAM ) 50 MG tablet [id number] Yes Take 1 tablet (50 mg total) by mouth every 6 (six) hours as needed. Borders, Fonda SAUNDERS, NP  Active Spouse/Significant Other, Self, Pharmacy Records           Med Note NIKKI, [place]OR[place]   Dju Feb 27, 2024 10:40 AM) prn  Med List Note Wendelyn Pulling, RN 11/25/23 1524): MR 12/25/23 UDS 11/25/23            Home Care and Equipment/Supplies: Were Home Health Services Ordered?: NA Any new equipment or medical supplies ordered?: NA  Functional Questionnaire: Do you need assistance with bathing/showering or  dressing?: No Do you need assistance with meal preparation?: No Do you need assistance with eating?: No Do you have difficulty maintaining continence: No Do you need assistance with getting out of bed/getting out of a chair/moving?: No Do you have difficulty managing or taking your medications?: No  Follow up appointments reviewed: PCP Follow-up appointment confirmed?: No (Patient states he has his follow ups right now with the cancer center and will conventrate on that) MD Provider Line [phone] Given: No Specialist Hospital Follow-up appointment confirmed?: Yes Date of Specialist follow-up appointment?: 03/09/24 Follow-Up Specialty Provider:: Pulmonologist Do you need transportation to your follow-up appointment?: No Do you understand care options if your condition(s) worsen?: Yes-patient verbalized understanding (Contact physician if:   You have a new or higher fever.   You are coughing more deeply or more often.   You are having increased shortness of breath.)  SDOH Interventions Today    Flowsheet Row Most Recent Value  SDOH Interventions   Food Insecurity Interventions Intervention Not Indicated  Housing  Interventions Intervention Not Indicated  Transportation Interventions Intervention Not Indicated  Utilities Interventions Intervention Not Indicated   Patient reports doing some better. In current Chemo for Lymphoma.  He reports currently taking antibiotics.  Discussed susceptibility to infection due to current chemo. Advised to avoid others who are sick if possible.  Advised to follow up with physicians.     Tredarius Cobern J. Cambell Stanek RN, MSN [facility], [facility] RN Care Manager Direct Dial: [phone]  Fax: [phone] Website: [email or web address]

## 2024-03-04 NOTE — Patient Instructions (Signed)
 Visit Information  Thank you for taking time to visit with me today.   Contact physician if: You have a new or higher fever. You are coughing more deeply or more often. You are having increased shortness of breath.  Patient verbalizes understanding of instructions and care plan provided today and agrees to view in MyChart. Active MyChart status and patient understanding of how to access instructions and care plan via MyChart confirmed with patient.     The patient has been provided with contact information for the care management team and has been advised to call with any health related questions or concerns.   Please call the care guide team at [phone] if you need to cancel or reschedule your appointment.   Please call the Suicide and Crisis Lifeline: 988 if you are experiencing a Mental Health or Behavioral Health Crisis or need someone to talk to.  Timathy Newberry J. Jensen Kilburg RN, MSN [facility], [facility] RN Care Manager Direct Dial: [phone]  Fax: [phone] Website: [email or web address]

## 2024-03-04 NOTE — Progress Notes (Signed)
 Discussed with Dgayli- plan bronch next week; push chemo by 1 week-   Pt will be informed- by RN- dw randall Barba.

## 2024-03-04 NOTE — Telephone Encounter (Signed)
 RN called and spoke with patient and reviewed plan of care.  Pt to see Dr Isadora on 03/09/24 at 1pm, at that appointment a time will be given for biopsy/bronchoscopy of lung on 03/15/24. Pt will come to Cancer Center on 03/17/24 for lab work, appointment with Dr Rennie and then chemotherapy infusion.  Above reviewed with patient who verbalized understanding and AVS mailed.

## 2024-03-08 ENCOUNTER — Encounter: Payer: Self-pay | Admitting: Internal Medicine

## 2024-03-09 ENCOUNTER — Ambulatory Visit: Admitting: Student in an Organized Health Care Education/Training Program

## 2024-03-09 ENCOUNTER — Ambulatory Visit (INDEPENDENT_AMBULATORY_CARE_PROVIDER_SITE_OTHER): Admitting: Student in an Organized Health Care Education/Training Program

## 2024-03-09 ENCOUNTER — Encounter: Payer: Self-pay | Admitting: Internal Medicine

## 2024-03-09 ENCOUNTER — Telehealth: Payer: Self-pay

## 2024-03-09 ENCOUNTER — Encounter: Payer: Self-pay | Admitting: Student in an Organized Health Care Education/Training Program

## 2024-03-09 VITALS — BP 110/60 | HR 130 | Temp 98.7°F | Ht 72.0 in | Wt 181.2 lb

## 2024-03-09 DIAGNOSIS — J988 Other specified respiratory disorders: Secondary | ICD-10-CM | POA: Diagnosis not present

## 2024-03-09 DIAGNOSIS — C8333 Diffuse large B-cell lymphoma, intra-abdominal lymph nodes: Secondary | ICD-10-CM

## 2024-03-09 DIAGNOSIS — R053 Chronic cough: Secondary | ICD-10-CM | POA: Diagnosis not present

## 2024-03-09 NOTE — H&P (View-Only) (Signed)
 Assessment & Plan:   1. 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 Narrowing of airway 3. Chronic cough  Narrowing of the right main stem and bronchus intermedius is likely due to inflammation and scar tissue from diffuse large B-cell lymphoma. Imaging shows no progression of the mass, but airway narrowing persists. Other potential cancers including primary lung malignancy are possible, but less likely. Plan is to perform bronchoscopy with biopsy to evaluate the airway and lymph nodes. Will consider balloon dilation during bronchoscopy if feasible to improve airway patency.  Intermittent dry cough and wheezing are attributed to airway narrowing from lymphoma-related inflammation and scar tissue. Symptoms align with obstructive airway disease due to anatomical narrowing. Address airway narrowing through bronchoscopy and potential balloon dilation.   We discussed the importance of diagnosis and staging in lung malignancies, and the approach to obtaining a tissue diagnosis which would include flexible bronchoscopy with endobronchial ultrasound guided sampling.  We also discussed the risks associated with the procedure which include a risk of pneumothorax, infection, bleeding, and nondiagnostic procedure in detail. I explained that patients typically are able to return home the same day of the procedure, but in rare cases admission to the hospital for observation and treatment is required.  After our discussion, the patient elected to proceed with the procedure  - Procedural/ Surgical Case Request: BRONCHOSCOPY, WITH EBUS; Future  Ruben November, MD [facility]  I spent 30 minutes caring for this patient today, including preparing to see the patient, obtaining a medical history , reviewing a separately obtained history, performing a medically appropriate examination and/or evaluation, counseling and educating the patient/family/caregiver,  ordering medications, tests, or procedures, documenting clinical information in the electronic health record, and independently interpreting results (not separately reported/billed) and communicating results to the patient/family/caregiver  End of visit medications:  No orders of the defined types were placed in this encounter.    Current Outpatient Medications:    acetaminophen  (TYLENOL ) 325 MG tablet, Take 2 tablets (650 mg total) by mouth every 6 (six) hours as needed for mild pain (pain score 1-3), fever, headache or moderate pain (pain score 4-6) (or Fever >/= 101)., Disp: , Rfl:    acyclovir  (ZOVIRAX ) 400 MG tablet, Take 1 tablet (400 mg total) by mouth 2 (two) times daily. [to prevent shingles], Disp: 60 tablet, Rfl: 3   albuterol  (VENTOLIN  HFA) 108 (90 Base) MCG/ACT inhaler, Inhale 1-2 puffs into the lungs every 6 (six) hours as needed., Disp: 18 g, Rfl: 0   allopurinol  (ZYLOPRIM ) 300 MG tablet, Take 1 tablet (300 mg total) by mouth 2 (two) times daily., Disp: 120 tablet, Rfl: 0   benzonatate  (TESSALON ) 100 MG capsule, Take 1-2 capsules (100-200 mg total) by mouth 3 (three) times daily as needed for cough., Disp: 90 capsule, Rfl: 0   chlorpheniramine-HYDROcodone  (TUSSIONEX) 10-8 MG/5ML, Take 5 mLs by mouth at bedtime as needed for cough., Disp: 140 mL, Rfl: 0   cyclobenzaprine  (FLEXERIL ) 10 MG tablet, Take 1 tablet (10 mg total) by mouth 3 (three) times daily as needed for muscle spasms., Disp: 30 tablet, Rfl: 2   diphenoxylate-atropine (LOMOTIL) 2.5-0.025 MG tablet, Take 1 tablet by mouth 4 (four) times daily as needed for diarrhea or loose stools. Diarrhea not responding to imodium, Disp: 45 tablet, Rfl: 1   lactulose  (CHRONULAC ) 10 GM/15ML solution, Take 15 mLs (10 g total) by mouth daily as needed for mild constipation., Disp: 236 mL, Rfl: 0   levofloxacin (LEVAQUIN) 750  MG tablet, Take 750 mg by mouth daily., Disp: , Rfl:    loperamide (IMODIUM A-D) 2 MG tablet, Take 1 tablet (2 mg  total) by mouth 4 (four) times daily as needed for diarrhea or loose stools., Disp: , Rfl:    nystatin  (MYCOSTATIN ) 100000 UNIT/ML suspension, Take 5 mLs (500,000 Units total) by mouth 4 (four) times daily., Disp: 473 mL, Rfl: 0   ondansetron  (ZOFRAN ) 4 MG tablet, Take 1 tablet (4 mg total) by mouth every 8 (eight) hours as needed for nausea or vomiting., Disp: 30 tablet, Rfl: 0   potassium chloride SA (KLOR-CON M) 20 MEQ tablet, Take 1 tablet (20 mEq total) by mouth daily., Disp: 7 tablet, Rfl: 0   [Paused] predniSONE  (DELTASONE ) 50 MG tablet, Take 2 tablets once day x 5 days. START on day of your chemotherapy. Take in AM; with FOOD., Disp: 10 tablet, Rfl: 5   prochlorperazine  (COMPAZINE ) 10 MG tablet, Take 1 tablet (10 mg total) by mouth every 6 (six) hours as needed for nausea or vomiting., Disp: 30 tablet, Rfl: 0   psyllium (HYDROCIL/METAMUCIL) 95 % PACK, Take 1 packet by mouth daily., Disp: , Rfl:    traMADol  (ULTRAM ) 50 MG tablet, Take 1 tablet (50 mg total) by mouth every 6 (six) hours as needed., Disp: 30 tablet, Rfl: 0   Subjective:   PATIENT ID: Ruben Garcia GENDER: male DOB: [birth year], MRN: [id number]  Chief Complaint  Patient presents with   Lung Mass    Wheezing. Dry cough. Hoarseness.    HPI  Discussed the use of AI scribe software for clinical note transcription with the patient, who gave verbal consent to proceed.  Ruben Garcia is a 78 year old male with diffuse large B cell lymphoma who presents with right main stem and bronchus intermedius narrowing. He was referred for consideration of bronchoscopy with biopsy.  In July 2025, he was found to have right hilar and mediastinal lymphadenopathy and mass, as well as a pelvic mass and a gluteal mass. A CT-guided biopsy of the gluteal mass revealed diffuse large B cell lymphoma, for which he was started on chemotherapy with good response. He has undergone imaging and PET scans to monitor his response to treatment showing  significant improvement in tumor burden.  Recently, he has been experiencing increased cough and shortness of breath. The cough is described as dry and intermittent, occurring sporadically. Wheezing has been present since the onset of symptoms. In October, he was admitted to the hospital for septic shock secondary to neutropenic fever, during which narrowing of the bronchus intermedius was also noted.  He has undergone multiple imaging studies, including CT and PET scans. The initial CT scan showed a large mass in the lung, which was lighting up on the first PET scan. Subsequent scans have shown improvement in the mass, but the airway remains narrow. The most recent CT scan in October continues to show narrowing of the airway on the right side. There was improvement in the lymphadenopathy and hilar mass.  He is not currently taking any blood thinners, having stopped all medications.     Ancillary information including prior medications, full medical/surgical/family/social histories, and PFTs (when available) are listed below and have been reviewed.    Review of Systems  Constitutional:  Negative for chills and fever.  Respiratory:  Positive for cough, shortness of breath and wheezing. Negative for hemoptysis and sputum production.   Cardiovascular:  Negative for chest pain.     Objective:  Vitals:   03/09/24 1415  BP: 110/60  Pulse: (!) 130  Temp: 98.7 F (37.1 C)  SpO2: 97%  Weight: 181 lb 3.2 oz (82.2 kg)  Height: 6' (1.829 m)   97% on RA BMI Readings from Last 3 Encounters:  03/09/24 24.58 kg/m  02/28/24 3538.75 kg/m  02/18/24 24.58 kg/m   Wt Readings from Last 3 Encounters:  03/09/24 181 lb 3.2 oz (82.2 kg)  02/28/24 181 lb 3.1 oz (82.2 kg)  02/18/24 181 lb 3.2 oz (82.2 kg)    Physical Exam Constitutional:      Appearance: Normal appearance.  Cardiovascular:     Rate and Rhythm: Normal rate and regular rhythm.     Pulses: Normal pulses.     Heart sounds:  Normal heart sounds.  Pulmonary:     Breath sounds: Wheezing (inspiratory and expiratory over the right middle and lower lung fields) present.  Neurological:     General: No focal deficit present.     Mental Status: He is alert. Mental status is at baseline.       Ancillary Information    Past Medical History:  Diagnosis Date   Allergic rhinitis, cause unspecified    Basal cell carcinoma of skin, site unspecified 04/28/2010   Nasal; [place].   BPH (benign prostatic hypertrophy)    Colon polyps    Essential hypertension, benign    Hemorrhoids, internal    Incomplete bladder emptying    Other abnormal glucose    Over weight    Personal history of colonic polyps    Personal history of other genital system and obstetric disorders(V13.29)    Pure hypercholesterolemia    Unspecified disorder of kidney and ureter    Unspecified disorder of liver      Family History  Problem Relation Age of Onset   Heart disease Mother        first MI in 20s   Diabetes Mother    Hyperlipidemia Mother    Arthritis Mother        rheumatoid   Cancer Father        lung   Diabetes Father    Arthritis Sister    Hyperlipidemia Sister    Hyperlipidemia Sister    Hypertension Sister    Hyperlipidemia Sister    Kidney disease Neg Hx    Prostate cancer Neg Hx    Colon cancer Neg Hx      Past Surgical History:  Procedure Laterality Date   BASAL CELL CARCINOMA EXCISION  2012   Facial        [place]   COLONOSCOPY  11/13/2010   single polyp; repeatin 5 years; [place]   COLONOSCOPY N/A 11/04/2021   Procedure: COLONOSCOPY;  Surgeon: Maryruth Ole DASEN, MD;  Location: [facility] ENDOSCOPY;  Service: Endoscopy;  Laterality: N/A;  REQUEST EARLY TIME   COLONOSCOPY WITH PROPOFOL  N/A 12/13/2015   Procedure: COLONOSCOPY WITH PROPOFOL ;  Surgeon: Lamar DASEN [place]toria, MD;  Location: [facility] ENDOSCOPY;  Service: Endoscopy;  Laterality: N/A;   ear surgery  05/2012   [name].  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  Benign.    HEMORRHOID SURGERY  1978   [name]   IR IMAGING GUIDED PORT INSERTION  12/04/2023   LAPAROSCOPIC CHOLECYSTECTOMY  1982   rotator cuff surgery Right 05/14/2016   VASECTOMY  1978    Social History   Socioeconomic History   Marital status: Married    Spouse name: Heron   Number of children: 2   Years of education: 2 years college  Highest education level: Not on file  Occupational History   Occupation: retired    Comment: postal service in 2005  Tobacco Use   Smoking status: Never   Smokeless tobacco: Never  Vaping Use   Vaping status: Never Used  Substance and Sexual Activity   Alcohol use: Not Currently    Alcohol/week: 5.0 standard drinks of alcohol    Types: 5 Cans of beer per week    Comment: weekends beer, 4- 6 total Cleotilde Mallory   Drug use: No   Sexual activity: Not Currently    Partners: Female  Other Topics Concern   Not on file  Social History Narrative   Always uses seat belts. Smoke alarm and carbon monoxide detector in the home. Guns in the home stored in locked cabinet.Caffeine use: Coffee 2 servings per day, moderate amount. Exercise: Moderate walking 2 - 4 miles daily, 2 - 3 days per week.    Marital status:  Married x 47 years, happily.      Children:  2 children; 1 grandchild (20).      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      Tobacco: never      Alcohol:  Weekends; beer 4-6 per weekend.      Exercise:  Walking every other day 2.5 miles three times per day.      Seatbelt: 100%; no texting      Guns: secured guns.      Living Will: completed in 2014.  FULL CODE but no prolonged measures.        ADLs: independent with ADLs; no assistant devices   Social Drivers of Health   Financial Resource Strain: Not on file  Food Insecurity: No Food Insecurity (03/04/2024)   Hunger Vital Sign    Worried About Running Out of Food in the Last Year: Never true    Ran Out of Food in the Last Year: Never true  Transportation Needs: No  Transportation Needs (03/04/2024)   PRAPARE - Administrator, Civil Service (Medical): No    Lack of Transportation (Non-Medical): No  Physical Activity: Not on file  Stress: Not on file  Social Connections: Moderately Integrated (02/27/2024)   Social Connection and Isolation Panel    Frequency of Communication with Friends and Family: Twice a week    Frequency of Social Gatherings with Friends and Family: Twice a week    Attends Religious Services: Patient declined    Database Administrator or Organizations: No    Attends Banker Meetings: 1 to 4 times per year    Marital Status: Married  Catering Manager Violence: Not At Risk (03/04/2024)   Humiliation, Afraid, Rape, and Kick questionnaire    Fear of Current or Ex-Partner: No    Emotionally Abused: No    Physically Abused: No    Sexually Abused: No     Allergies  Allergen Reactions   Codeine Nausea Only     CBC    Component Value Date/Time   WBC 7.3 02/29/2024 0747   RBC 2.65 (L) 02/29/2024 0747   HGB 8.4 (L) 02/29/2024 0747   HGB 9.0 (L) 02/26/2024 1307   HGB 15.9 02/10/2019 0949   HCT 25.4 (L) 02/29/2024 0747   HCT 46.6 02/10/2019 0949   PLT 109 (L) 02/29/2024 0747   PLT 95 (L) 02/26/2024 1307   PLT 267 02/04/2018 0938   MCV 95.8 02/29/2024 0747   MCV 95 02/10/2019 0949   MCH  31.7 02/29/2024 0747   MCHC 33.1 02/29/2024 0747   RDW 16.1 (H) 02/29/2024 0747   RDW 12.3 02/10/2019 0949   LYMPHSABS 0.5 (L) 02/29/2024 0747   LYMPHSABS 2.4 02/10/2019 0949   MONOABS 0.6 02/29/2024 0747   EOSABS 0.0 02/29/2024 0747   EOSABS 0.1 02/10/2019 0949   BASOSABS 0.1 02/29/2024 0747   BASOSABS 0.1 02/10/2019 0949    Pulmonary Functions Testing Results:     No data to display          Outpatient Medications Prior to Visit  Medication Sig Dispense Refill   acetaminophen  (TYLENOL ) 325 MG tablet Take 2 tablets (650 mg total) by mouth every 6 (six) hours as needed for mild pain (pain score 1-3),  fever, headache or moderate pain (pain score 4-6) (or Fever >/= 101).     acyclovir  (ZOVIRAX ) 400 MG tablet Take 1 tablet (400 mg total) by mouth 2 (two) times daily. [to prevent shingles] 60 tablet 3   albuterol  (VENTOLIN  HFA) 108 (90 Base) MCG/ACT inhaler Inhale 1-2 puffs into the lungs every 6 (six) hours as needed. 18 g 0   allopurinol  (ZYLOPRIM ) 300 MG tablet Take 1 tablet (300 mg total) by mouth 2 (two) times daily. 120 tablet 0   benzonatate  (TESSALON ) 100 MG capsule Take 1-2 capsules (100-200 mg total) by mouth 3 (three) times daily as needed for cough. 90 capsule 0   chlorpheniramine-HYDROcodone  (TUSSIONEX) 10-8 MG/5ML Take 5 mLs by mouth at bedtime as needed for cough. 140 mL 0   cyclobenzaprine  (FLEXERIL ) 10 MG tablet Take 1 tablet (10 mg total) by mouth 3 (three) times daily as needed for muscle spasms. 30 tablet 2   diphenoxylate-atropine (LOMOTIL) 2.5-0.025 MG tablet Take 1 tablet by mouth 4 (four) times daily as needed for diarrhea or loose stools. Diarrhea not responding to imodium 45 tablet 1   lactulose  (CHRONULAC ) 10 GM/15ML solution Take 15 mLs (10 g total) by mouth daily as needed for mild constipation. 236 mL 0   levofloxacin (LEVAQUIN) 750 MG tablet Take 750 mg by mouth daily.     loperamide (IMODIUM A-D) 2 MG tablet Take 1 tablet (2 mg total) by mouth 4 (four) times daily as needed for diarrhea or loose stools.     nystatin  (MYCOSTATIN ) 100000 UNIT/ML suspension Take 5 mLs (500,000 Units total) by mouth 4 (four) times daily. 473 mL 0   ondansetron  (ZOFRAN ) 4 MG tablet Take 1 tablet (4 mg total) by mouth every 8 (eight) hours as needed for nausea or vomiting. 30 tablet 0   potassium chloride SA (KLOR-CON M) 20 MEQ tablet Take 1 tablet (20 mEq total) by mouth daily. 7 tablet 0   predniSONE  (DELTASONE ) 50 MG tablet Take 2 tablets once day x 5 days. START on day of your chemotherapy. Take in AM; with FOOD. 10 tablet 5   prochlorperazine  (COMPAZINE ) 10 MG tablet Take 1 tablet (10 mg  total) by mouth every 6 (six) hours as needed for nausea or vomiting. 30 tablet 0   psyllium (HYDROCIL/METAMUCIL) 95 % PACK Take 1 packet by mouth daily.     traMADol  (ULTRAM ) 50 MG tablet Take 1 tablet (50 mg total) by mouth every 6 (six) hours as needed. 30 tablet 0   No facility-administered medications prior to visit.

## 2024-03-09 NOTE — Telephone Encounter (Signed)
 Bronchoscopy with EBUS 03/15/2024 at 12:30pm Lung Nodule [id number]  Ruben Garcia please see bronch into.   Patient is aware and bronch email has been sent.

## 2024-03-09 NOTE — Progress Notes (Signed)
 Assessment & Plan:   1. 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 Narrowing of airway 3. Chronic cough  Narrowing of the right main stem and bronchus intermedius is likely due to inflammation and scar tissue from diffuse large B-cell lymphoma. Imaging shows no progression of the mass, but airway narrowing persists. Other potential cancers including primary lung malignancy are possible, but less likely. Plan is to perform bronchoscopy with biopsy to evaluate the airway and lymph nodes. Will consider balloon dilation during bronchoscopy if feasible to improve airway patency.  Intermittent dry cough and wheezing are attributed to airway narrowing from lymphoma-related inflammation and scar tissue. Symptoms align with obstructive airway disease due to anatomical narrowing. Address airway narrowing through bronchoscopy and potential balloon dilation.   We discussed the importance of diagnosis and staging in lung malignancies, and the approach to obtaining a tissue diagnosis which would include flexible bronchoscopy with endobronchial ultrasound guided sampling.  We also discussed the risks associated with the procedure which include a risk of pneumothorax, infection, bleeding, and nondiagnostic procedure in detail. I explained that patients typically are able to return home the same day of the procedure, but in rare cases admission to the hospital for observation and treatment is required.  After our discussion, the patient elected to proceed with the procedure  - Procedural/ Surgical Case Request: BRONCHOSCOPY, WITH EBUS; Future  [name], MD [facility]  I spent 30 minutes caring for this patient today, including preparing to see the patient, obtaining a medical history , reviewing a separately obtained history, performing a medically appropriate examination and/or evaluation, counseling and educating the patient/family/caregiver,  ordering medications, tests, or procedures, documenting clinical information in the electronic health record, and independently interpreting results (not separately reported/billed) and communicating results to the patient/family/caregiver  End of visit medications:  No orders of the defined types were placed in this encounter.    Current Outpatient Medications:    acetaminophen  (TYLENOL ) 325 MG tablet, Take 2 tablets (650 mg total) by mouth every 6 (six) hours as needed for mild pain (pain score 1-3), fever, headache or moderate pain (pain score 4-6) (or Fever >/= 101)., Disp: , Rfl:    acyclovir  (ZOVIRAX ) 400 MG tablet, Take 1 tablet (400 mg total) by mouth 2 (two) times daily. [to prevent shingles], Disp: 60 tablet, Rfl: 3   albuterol  (VENTOLIN  HFA) 108 (90 Base) MCG/ACT inhaler, Inhale 1-2 puffs into the lungs every 6 (six) hours as needed., Disp: 18 g, Rfl: 0   allopurinol  (ZYLOPRIM ) 300 MG tablet, Take 1 tablet (300 mg total) by mouth 2 (two) times daily., Disp: 120 tablet, Rfl: 0   benzonatate  (TESSALON ) 100 MG capsule, Take 1-2 capsules (100-200 mg total) by mouth 3 (three) times daily as needed for cough., Disp: 90 capsule, Rfl: 0   chlorpheniramine-HYDROcodone  (TUSSIONEX) 10-8 MG/5ML, Take 5 mLs by mouth at bedtime as needed for cough., Disp: 140 mL, Rfl: 0   cyclobenzaprine  (FLEXERIL ) 10 MG tablet, Take 1 tablet (10 mg total) by mouth 3 (three) times daily as needed for muscle spasms., Disp: 30 tablet, Rfl: 2   diphenoxylate-atropine (LOMOTIL) 2.5-0.025 MG tablet, Take 1 tablet by mouth 4 (four) times daily as needed for diarrhea or loose stools. Diarrhea not responding to imodium, Disp: 45 tablet, Rfl: 1   lactulose  (CHRONULAC ) 10 GM/15ML solution, Take 15 mLs (10 g total) by mouth daily as needed for mild constipation., Disp: 236 mL, Rfl: 0   levofloxacin (LEVAQUIN) 750  MG tablet, Take 750 mg by mouth daily., Disp: , Rfl:    loperamide (IMODIUM A-D) 2 MG tablet, Take 1 tablet (2 mg  total) by mouth 4 (four) times daily as needed for diarrhea or loose stools., Disp: , Rfl:    nystatin  (MYCOSTATIN ) 100000 UNIT/ML suspension, Take 5 mLs (500,000 Units total) by mouth 4 (four) times daily., Disp: 473 mL, Rfl: 0   ondansetron  (ZOFRAN ) 4 MG tablet, Take 1 tablet (4 mg total) by mouth every 8 (eight) hours as needed for nausea or vomiting., Disp: 30 tablet, Rfl: 0   potassium chloride SA (KLOR-CON M) 20 MEQ tablet, Take 1 tablet (20 mEq total) by mouth daily., Disp: 7 tablet, Rfl: 0   [Paused] predniSONE  (DELTASONE ) 50 MG tablet, Take 2 tablets once day x 5 days. START on day of your chemotherapy. Take in AM; with FOOD., Disp: 10 tablet, Rfl: 5   prochlorperazine  (COMPAZINE ) 10 MG tablet, Take 1 tablet (10 mg total) by mouth every 6 (six) hours as needed for nausea or vomiting., Disp: 30 tablet, Rfl: 0   psyllium (HYDROCIL/METAMUCIL) 95 % PACK, Take 1 packet by mouth daily., Disp: , Rfl:    traMADol  (ULTRAM ) 50 MG tablet, Take 1 tablet (50 mg total) by mouth every 6 (six) hours as needed., Disp: 30 tablet, Rfl: 0   Subjective:   PATIENT ID: Ruben Garcia GENDER: male DOB: [birth year], MRN: [id number]  Chief Complaint  Patient presents with   Lung Mass    Wheezing. Dry cough. Hoarseness.    HPI  Discussed the use of AI scribe software for clinical note transcription with the patient, who gave verbal consent to proceed.  Ruben Garcia is a 78 year old male with diffuse large B cell lymphoma who presents with right main stem and bronchus intermedius narrowing. He was referred for consideration of bronchoscopy with biopsy.  In July 2025, he was found to have right hilar and mediastinal lymphadenopathy and mass, as well as a pelvic mass and a gluteal mass. A CT-guided biopsy of the gluteal mass revealed diffuse large B cell lymphoma, for which he was started on chemotherapy with good response. He has undergone imaging and PET scans to monitor his response to treatment showing  significant improvement in tumor burden.  Recently, he has been experiencing increased cough and shortness of breath. The cough is described as dry and intermittent, occurring sporadically. Wheezing has been present since the onset of symptoms. In October, he was admitted to the hospital for septic shock secondary to neutropenic fever, during which narrowing of the bronchus intermedius was also noted.  He has undergone multiple imaging studies, including CT and PET scans. The initial CT scan showed a large mass in the lung, which was lighting up on the first PET scan. Subsequent scans have shown improvement in the mass, but the airway remains narrow. The most recent CT scan in October continues to show narrowing of the airway on the right side. There was improvement in the lymphadenopathy and hilar mass.  He is not currently taking any blood thinners, having stopped all medications.     Ancillary information including prior medications, full medical/surgical/family/social histories, and PFTs (when available) are listed below and have been reviewed.    Review of Systems  Constitutional:  Negative for chills and fever.  Respiratory:  Positive for cough, shortness of breath and wheezing. Negative for hemoptysis and sputum production.   Cardiovascular:  Negative for chest pain.     Objective:  Vitals:   03/09/24 1415  BP: 110/60  Pulse: (!) 130  Temp: 98.7 F (37.1 C)  SpO2: 97%  Weight: 181 lb 3.2 oz (82.2 kg)  Height: 6' (1.829 m)   97% on RA BMI Readings from Last 3 Encounters:  03/09/24 24.58 kg/m  02/28/24 3538.75 kg/m  02/18/24 24.58 kg/m   Wt Readings from Last 3 Encounters:  03/09/24 181 lb 3.2 oz (82.2 kg)  02/28/24 181 lb 3.1 oz (82.2 kg)  02/18/24 181 lb 3.2 oz (82.2 kg)    Physical Exam Constitutional:      Appearance: Normal appearance.  Cardiovascular:     Rate and Rhythm: Normal rate and regular rhythm.     Pulses: Normal pulses.     Heart sounds:  Normal heart sounds.  Pulmonary:     Breath sounds: Wheezing (inspiratory and expiratory over the right middle and lower lung fields) present.  Neurological:     General: No focal deficit present.     Mental Status: He is alert. Mental status is at baseline.       Ancillary Information    Past Medical History:  Diagnosis Date   Allergic rhinitis, cause unspecified    Basal cell carcinoma of skin, site unspecified 04/28/2010   Nasal; [place].   BPH (benign prostatic hypertrophy)    Colon polyps    Essential hypertension, benign    Hemorrhoids, internal    Incomplete bladder emptying    Other abnormal glucose    Over weight    Personal history of colonic polyps    Personal history of other genital system and obstetric disorders(V13.29)    Pure hypercholesterolemia    Unspecified disorder of kidney and ureter    Unspecified disorder of liver      Family History  Problem Relation Age of Onset   Heart disease Mother        first MI in 20s   Diabetes Mother    Hyperlipidemia Mother    Arthritis Mother        rheumatoid   Cancer Father        lung   Diabetes Father    Arthritis Sister    Hyperlipidemia Sister    Hyperlipidemia Sister    Hypertension Sister    Hyperlipidemia Sister    Kidney disease Neg Hx    Prostate cancer Neg Hx    Colon cancer Neg Hx      Past Surgical History:  Procedure Laterality Date   BASAL CELL CARCINOMA EXCISION  2012   Facial        [place]   COLONOSCOPY  11/13/2010   single polyp; repeatin 5 years; [place]   COLONOSCOPY N/A 11/04/2021   Procedure: COLONOSCOPY;  Surgeon: Maryruth Ole DASEN, MD;  Location: [facility] ENDOSCOPY;  Service: Endoscopy;  Laterality: N/A;  REQUEST EARLY TIME   COLONOSCOPY WITH PROPOFOL  N/A 12/13/2015   Procedure: COLONOSCOPY WITH PROPOFOL ;  Surgeon: Lamar DASEN Viktoria, MD;  Location: [facility] ENDOSCOPY;  Service: Endoscopy;  Laterality: N/A;   ear surgery  05/2012   [name].  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  Benign.    HEMORRHOID SURGERY  1978   [name]   IR IMAGING GUIDED PORT INSERTION  12/04/2023   LAPAROSCOPIC CHOLECYSTECTOMY  1982   rotator cuff surgery Right 05/14/2016   VASECTOMY  1978    Social History   Socioeconomic History   Marital status: Married    Spouse name: Heron   Number of children: 2   Years of education: 2 years college  Highest education level: Not on file  Occupational History   Occupation: retired    Comment: postal service in 2005  Tobacco Use   Smoking status: Never   Smokeless tobacco: Never  Vaping Use   Vaping status: Never Used  Substance and Sexual Activity   Alcohol use: Not Currently    Alcohol/week: 5.0 standard drinks of alcohol    Types: 5 Cans of beer per week    Comment: weekends beer, 4- 6 total Cleotilde Mallory   Drug use: No   Sexual activity: Not Currently    Partners: Female  Other Topics Concern   Not on file  Social History Narrative   Always uses seat belts. Smoke alarm and carbon monoxide detector in the home. Guns in the home stored in locked cabinet.Caffeine use: Coffee 2 servings per day, moderate amount. Exercise: Moderate walking 2 - 4 miles daily, 2 - 3 days per week.    Marital status:  Married x 47 years, happily.      Children:  2 children; 1 grandchild (20).      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      Tobacco: never      Alcohol:  Weekends; beer 4-6 per weekend.      Exercise:  Walking every other day 2.5 miles three times per day.      Seatbelt: 100%; no texting      Guns: secured guns.      Living Will: completed in 2014.  FULL CODE but no prolonged measures.        ADLs: independent with ADLs; no assistant devices   Social Drivers of Health   Financial Resource Strain: Not on file  Food Insecurity: No Food Insecurity (03/04/2024)   Hunger Vital Sign    Worried About Running Out of Food in the Last Year: Never true    Ran Out of Food in the Last Year: Never true  Transportation Needs: No  Transportation Needs (03/04/2024)   PRAPARE - Administrator, Civil Service (Medical): No    Lack of Transportation (Non-Medical): No  Physical Activity: Not on file  Stress: Not on file  Social Connections: Moderately Integrated (02/27/2024)   Social Connection and Isolation Panel    Frequency of Communication with Friends and Family: Twice a week    Frequency of Social Gatherings with Friends and Family: Twice a week    Attends Religious Services: Patient declined    Database Administrator or Organizations: No    Attends Banker Meetings: 1 to 4 times per year    Marital Status: Married  Catering Manager Violence: Not At Risk (03/04/2024)   Humiliation, Afraid, Rape, and Kick questionnaire    Fear of Current or Ex-Partner: No    Emotionally Abused: No    Physically Abused: No    Sexually Abused: No     Allergies  Allergen Reactions   Codeine Nausea Only     CBC    Component Value Date/Time   WBC 7.3 02/29/2024 0747   RBC 2.65 (L) 02/29/2024 0747   HGB 8.4 (L) 02/29/2024 0747   HGB 9.0 (L) 02/26/2024 1307   HGB 15.9 02/10/2019 0949   HCT 25.4 (L) 02/29/2024 0747   HCT 46.6 02/10/2019 0949   PLT 109 (L) 02/29/2024 0747   PLT 95 (L) 02/26/2024 1307   PLT 267 02/04/2018 0938   MCV 95.8 02/29/2024 0747   MCV 95 02/10/2019 0949   MCH  31.7 02/29/2024 0747   MCHC 33.1 02/29/2024 0747   RDW 16.1 (H) 02/29/2024 0747   RDW 12.3 02/10/2019 0949   LYMPHSABS 0.5 (L) 02/29/2024 0747   LYMPHSABS 2.4 02/10/2019 0949   MONOABS 0.6 02/29/2024 0747   EOSABS 0.0 02/29/2024 0747   EOSABS 0.1 02/10/2019 0949   BASOSABS 0.1 02/29/2024 0747   BASOSABS 0.1 02/10/2019 0949    Pulmonary Functions Testing Results:     No data to display          Outpatient Medications Prior to Visit  Medication Sig Dispense Refill   acetaminophen  (TYLENOL ) 325 MG tablet Take 2 tablets (650 mg total) by mouth every 6 (six) hours as needed for mild pain (pain score 1-3),  fever, headache or moderate pain (pain score 4-6) (or Fever >/= 101).     acyclovir  (ZOVIRAX ) 400 MG tablet Take 1 tablet (400 mg total) by mouth 2 (two) times daily. [to prevent shingles] 60 tablet 3   albuterol  (VENTOLIN  HFA) 108 (90 Base) MCG/ACT inhaler Inhale 1-2 puffs into the lungs every 6 (six) hours as needed. 18 g 0   allopurinol  (ZYLOPRIM ) 300 MG tablet Take 1 tablet (300 mg total) by mouth 2 (two) times daily. 120 tablet 0   benzonatate  (TESSALON ) 100 MG capsule Take 1-2 capsules (100-200 mg total) by mouth 3 (three) times daily as needed for cough. 90 capsule 0   chlorpheniramine-HYDROcodone  (TUSSIONEX) 10-8 MG/5ML Take 5 mLs by mouth at bedtime as needed for cough. 140 mL 0   cyclobenzaprine  (FLEXERIL ) 10 MG tablet Take 1 tablet (10 mg total) by mouth 3 (three) times daily as needed for muscle spasms. 30 tablet 2   diphenoxylate-atropine (LOMOTIL) 2.5-0.025 MG tablet Take 1 tablet by mouth 4 (four) times daily as needed for diarrhea or loose stools. Diarrhea not responding to imodium 45 tablet 1   lactulose  (CHRONULAC ) 10 GM/15ML solution Take 15 mLs (10 g total) by mouth daily as needed for mild constipation. 236 mL 0   levofloxacin (LEVAQUIN) 750 MG tablet Take 750 mg by mouth daily.     loperamide (IMODIUM A-D) 2 MG tablet Take 1 tablet (2 mg total) by mouth 4 (four) times daily as needed for diarrhea or loose stools.     nystatin  (MYCOSTATIN ) 100000 UNIT/ML suspension Take 5 mLs (500,000 Units total) by mouth 4 (four) times daily. 473 mL 0   ondansetron  (ZOFRAN ) 4 MG tablet Take 1 tablet (4 mg total) by mouth every 8 (eight) hours as needed for nausea or vomiting. 30 tablet 0   potassium chloride SA (KLOR-CON M) 20 MEQ tablet Take 1 tablet (20 mEq total) by mouth daily. 7 tablet 0   predniSONE  (DELTASONE ) 50 MG tablet Take 2 tablets once day x 5 days. START on day of your chemotherapy. Take in AM; with FOOD. 10 tablet 5   prochlorperazine  (COMPAZINE ) 10 MG tablet Take 1 tablet (10 mg  total) by mouth every 6 (six) hours as needed for nausea or vomiting. 30 tablet 0   psyllium (HYDROCIL/METAMUCIL) 95 % PACK Take 1 packet by mouth daily.     traMADol  (ULTRAM ) 50 MG tablet Take 1 tablet (50 mg total) by mouth every 6 (six) hours as needed. 30 tablet 0   No facility-administered medications prior to visit.

## 2024-03-09 NOTE — Patient Instructions (Signed)
  VISIT SUMMARY: You were seen today for your ongoing issues related to diffuse large B-cell lymphoma, specifically the narrowing of your right main stem and bronchus intermedius. You have been experiencing increased cough and shortness of breath, which are likely due to this narrowing. We discussed the results of your recent imaging studies and the plan for further evaluation and treatment.  YOUR PLAN: -RIGHT MAIN STEM AND BRONCHUS INTERMEDIUS NARROWING: The narrowing of your right main stem and bronchus intermedius is likely due to inflammation and scar tissue from your diffuse large B-cell lymphoma. Although the mass has not progressed, the airway remains narrow. We will perform a bronchoscopy with biopsy to evaluate the airway and lymph nodes. If possible, we may also perform balloon dilation during the bronchoscopy to help open up the airway.  -COUGH AND WHEEZING: Your intermittent dry cough and wheezing are due to the narrowing of your airway from lymphoma-related inflammation and scar tissue. We will address this by performing a bronchoscopy and potentially using balloon dilation to improve your airway.  INSTRUCTIONS: Please follow up with the recommended bronchoscopy and biopsy to further evaluate your airway and lymph nodes. If you experience any worsening of symptoms, such as increased shortness of breath or coughing up blood, seek medical attention immediately.

## 2024-03-10 ENCOUNTER — Ambulatory Visit: Admitting: Internal Medicine

## 2024-03-10 ENCOUNTER — Other Ambulatory Visit

## 2024-03-10 ENCOUNTER — Inpatient Hospital Stay

## 2024-03-10 ENCOUNTER — Ambulatory Visit

## 2024-03-10 NOTE — Telephone Encounter (Signed)
 Noted. Nothing further needed.

## 2024-03-10 NOTE — Telephone Encounter (Signed)
 For the code [id number] Prior Auth Not Required Refer # India [id number] 03/10/2024

## 2024-03-11 ENCOUNTER — Ambulatory Visit

## 2024-03-14 ENCOUNTER — Encounter
Admission: RE | Admit: 2024-03-14 | Discharge: 2024-03-14 | Disposition: A | Discharge status: Home / Self Care | Source: Ambulatory Visit | Attending: Student in an Organized Health Care Education/Training Program | Admitting: Student in an Organized Health Care Education/Training Program

## 2024-03-14 ENCOUNTER — Other Ambulatory Visit: Payer: Self-pay

## 2024-03-14 DIAGNOSIS — R918 Other nonspecific abnormal finding of lung field: Secondary | ICD-10-CM

## 2024-03-14 DIAGNOSIS — D638 Anemia in other chronic diseases classified elsewhere: Secondary | ICD-10-CM

## 2024-03-14 HISTORY — DX: Chronic cough: R05.3

## 2024-03-14 HISTORY — DX: Pneumonia, unspecified organism: J18.9

## 2024-03-14 HISTORY — DX: Anemia, unspecified: D64.9

## 2024-03-14 HISTORY — DX: Dyspnea, unspecified: R06.00

## 2024-03-14 HISTORY — DX: Other specified respiratory disorders: J98.8

## 2024-03-14 MED ORDER — CHLORHEXIDINE GLUCONATE 0.12 % MT SOLN
15.0000 mL | Freq: Once | OROMUCOSAL | Status: AC
  Administered 2024-03-15: 15 mL via OROMUCOSAL

## 2024-03-14 MED ORDER — ORAL CARE MOUTH RINSE: 15.0000 mL | Freq: Once | OROMUCOSAL | Status: AC

## 2024-03-14 MED ORDER — LACTATED RINGERS IV SOLN: INTRAVENOUS | Status: DC

## 2024-03-14 NOTE — Patient Instructions (Addendum)
 Your procedure is scheduled on: 03/15/24  Report to the Registration Desk on the 1st floor of the [facility]. To find out your arrival time, please call [phone] between 1PM - 3PM on: 03/14/24 If your arrival time is 6:00 am, do not arrive before that time as the [facility] entrance doors do not open until 6:00 am.  REMEMBER: Instructions that are not followed completely may result in serious medical risk, up to and including death; or upon the discretion of your surgeon and anesthesiologist your surgery may need to be rescheduled.  Do not eat food or drink any liquids  after midnight the night before surgery.  No gum chewing or hard candies.  One week prior to surgery: Stop Anti-inflammatories (NSAIDS) such as Advil , Aleve, Ibuprofen , Motrin , Naproxen, Naprosyn and Aspirin based products such as Excedrin, Goody's Powder, BC Powder. You may continue to take Tylenol  if needed for pain up until the day of surgery.  Stop ANY OVER THE COUNTER supplements until after surgery.  ON THE DAY OF SURGERY ONLY TAKE THESE MEDICATIONS WITH SIPS OF WATER:  traMADol  (ULTRAM ) if needed albuterol  (VENTOLIN  HFA)    No Alcohol for 24 hours before or after surgery.  No Smoking including e-cigarettes for 24 hours before surgery.  No chewable tobacco products for at least 6 hours before surgery.  No nicotine patches on the day of surgery.  Do not use any recreational drugs for at least a week (preferably 2 weeks) before your surgery.  Please be advised that the combination of cocaine and anesthesia may have negative outcomes, up to and including death. If you test positive for cocaine, your surgery will be cancelled.  On the morning of surgery brush your teeth with toothpaste and water, you may rinse your mouth with mouthwash if you wish. Do not swallow any toothpaste or mouthwash.  Do not wear jewelry, make-up, hairpins, clips or nail polish.  For welded (permanent) jewelry: bracelets,  anklets, waist bands, etc.  Please have this removed prior to surgery.  If it is not removed, there is a chance that hospital personnel will need to cut it off on the day of surgery.  Do not wear lotions, powders, or perfumes.   Do not shave body hair from the neck down 48 hours before surgery.  Contact lenses, hearing aids and dentures may not be worn into surgery.  Do not bring valuables to the hospital. [facility] is not responsible for any missing/lost belongings or valuables.   Notify your doctor if there is any change in your medical condition (cold, fever, infection).  Wear comfortable clothing (specific to your surgery type) to the hospital.  After surgery, you can help prevent lung complications by doing breathing exercises.  Take deep breaths and cough every 1-2 hours. Your doctor may order a device called an Incentive Spirometer to help you take deep breaths.  If you are being admitted to the hospital overnight, leave your suitcase in the car. After surgery it may be brought to your room.  In case of increased patient census, it may be necessary for you, the patient, to continue your postoperative care in the Same Day Surgery department.  If you are being discharged the day of surgery, you will not be allowed to drive home. You will need a responsible individual to drive you home and stay with you for 24 hours after surgery.   If you are taking public transportation, you will need to have a responsible individual with you.  Please call the Pre-admissions Testing Dept. at [phone] if you have any questions about these instructions.  Surgery Visitation Policy:  Patients having surgery or a procedure may have two visitors.  Children under the age of 87 must have an adult with them who is not the patient.  Inpatient Visitation:    Visiting hours are 7 a.m. to 8 p.m. Up to four visitors are allowed at one time in a patient room. The visitors may rotate out with other  people during the day.  One visitor age 54 or older may stay with the patient overnight and must be in the room by 8 p.m.   Merchandiser, Retail to address health-related social needs:  [email or web address]

## 2024-03-15 ENCOUNTER — Ambulatory Visit

## 2024-03-15 ENCOUNTER — Ambulatory Visit
Admission: RE | Admit: 2024-03-15 | Discharge: 2024-03-15 | Disposition: A | Discharge status: Home / Self Care | Attending: Student in an Organized Health Care Education/Training Program | Admitting: Student in an Organized Health Care Education/Training Program

## 2024-03-15 ENCOUNTER — Other Ambulatory Visit: Payer: Self-pay

## 2024-03-15 ENCOUNTER — Encounter
Admission: RE | Disposition: A | Discharge status: Home / Self Care | Payer: Self-pay | Source: Home / Self Care | Attending: Student in an Organized Health Care Education/Training Program

## 2024-03-15 ENCOUNTER — Encounter: Payer: Self-pay | Admitting: Student in an Organized Health Care Education/Training Program

## 2024-03-15 DIAGNOSIS — J9809 Other diseases of bronchus, not elsewhere classified: Secondary | ICD-10-CM | POA: Insufficient documentation

## 2024-03-15 DIAGNOSIS — J988 Other specified respiratory disorders: Secondary | ICD-10-CM | POA: Diagnosis not present

## 2024-03-15 DIAGNOSIS — R053 Chronic cough: Secondary | ICD-10-CM | POA: Insufficient documentation

## 2024-03-15 DIAGNOSIS — D638 Anemia in other chronic diseases classified elsewhere: Secondary | ICD-10-CM

## 2024-03-15 DIAGNOSIS — C8333 Diffuse large B-cell lymphoma, intra-abdominal lymph nodes: Secondary | ICD-10-CM | POA: Diagnosis present

## 2024-03-15 DIAGNOSIS — R590 Localized enlarged lymph nodes: Secondary | ICD-10-CM | POA: Insufficient documentation

## 2024-03-15 DIAGNOSIS — I10 Essential (primary) hypertension: Secondary | ICD-10-CM | POA: Insufficient documentation

## 2024-03-15 DIAGNOSIS — C8330 Diffuse large B-cell lymphoma, unspecified site: Secondary | ICD-10-CM | POA: Insufficient documentation

## 2024-03-15 HISTORY — PX: VIDEO BRONCHOSCOPY WITH ENDOBRONCHIAL ULTRASOUND: SHX6177

## 2024-03-15 LAB — POCT I-STAT, CHEM 8
BUN: 7 mg/dL — ABNORMAL LOW (ref 8–23)
Calcium, Ion: 1.11 mmol/L — ABNORMAL LOW (ref 1.15–1.40)
Chloride: 105 mmol/L (ref 98–111)
Creatinine, Ser: 0.8 mg/dL (ref 0.61–1.24)
Glucose, Bld: 118 mg/dL — ABNORMAL HIGH (ref 70–99)
HCT: 35 % — ABNORMAL LOW (ref 39.0–52.0)
Hemoglobin: 11.9 g/dL — ABNORMAL LOW (ref 13.0–17.0)
Potassium: 3.9 mmol/L (ref 3.5–5.1)
Sodium: 138 mmol/L (ref 135–145)
TCO2: 21 mmol/L — ABNORMAL LOW (ref 22–32)

## 2024-03-15 SURGERY — BRONCHOSCOPY, WITH EBUS
Anesthesia: General | Laterality: Bilateral

## 2024-03-15 MED ORDER — FENTANYL CITRATE (PF) 100 MCG/2ML IJ SOLN: 25.0000 ug | INTRAMUSCULAR | Status: DC | PRN

## 2024-03-15 MED ORDER — ONDANSETRON HCL 4 MG/2ML IJ SOLN
INTRAMUSCULAR | Status: AC
  Filled 2024-03-15: qty 2

## 2024-03-15 MED ORDER — LIDOCAINE HCL (PF) 2 % IJ SOLN
INTRAMUSCULAR | Status: AC
  Filled 2024-03-15: qty 5

## 2024-03-15 MED ORDER — CHLORHEXIDINE GLUCONATE 0.12 % MT SOLN
OROMUCOSAL | Status: AC
  Filled 2024-03-15: qty 15

## 2024-03-15 MED ORDER — ONDANSETRON HCL 4 MG/2ML IJ SOLN
INTRAMUSCULAR | Status: DC | PRN
Start: 2024-03-15 — End: 2024-03-15
  Administered 2024-03-15: 4 mg via INTRAVENOUS

## 2024-03-15 MED ORDER — SUGAMMADEX SODIUM 200 MG/2ML IV SOLN
INTRAVENOUS | Status: DC | PRN
  Administered 2024-03-15: 200 mg via INTRAVENOUS

## 2024-03-15 MED ORDER — PROPOFOL 10 MG/ML IV BOLUS
INTRAVENOUS | Status: DC | PRN
  Administered 2024-03-15: 75 ug/kg/min via INTRAVENOUS

## 2024-03-15 MED ORDER — MIDAZOLAM HCL 2 MG/2ML IJ SOLN
INTRAMUSCULAR | Status: AC
  Filled 2024-03-15: qty 2

## 2024-03-15 MED ORDER — FENTANYL CITRATE (PF) 100 MCG/2ML IJ SOLN
INTRAMUSCULAR | Status: AC
  Filled 2024-03-15: qty 2

## 2024-03-15 MED ORDER — PHENYLEPHRINE 80 MCG/ML (10ML) SYRINGE FOR IV PUSH (FOR BLOOD PRESSURE SUPPORT)
PREFILLED_SYRINGE | INTRAVENOUS | Status: AC
  Filled 2024-03-15: qty 20

## 2024-03-15 MED ORDER — OXYCODONE HCL 5 MG PO TABS: 5.0000 mg | ORAL_TABLET | Freq: Once | ORAL | Status: DC | PRN

## 2024-03-15 MED ORDER — ROCURONIUM BROMIDE 100 MG/10ML IV SOLN
INTRAVENOUS | Status: DC | PRN
  Administered 2024-03-15: 50 mg via INTRAVENOUS
  Administered 2024-03-15: 20 mg via INTRAVENOUS

## 2024-03-15 MED ORDER — LIDOCAINE HCL (CARDIAC) PF 100 MG/5ML IV SOSY
PREFILLED_SYRINGE | INTRAVENOUS | Status: DC | PRN
  Administered 2024-03-15: 100 mg via INTRAVENOUS

## 2024-03-15 MED ORDER — VASOPRESSIN 20 UNIT/ML IV SOLN
INTRAVENOUS | Status: DC | PRN
  Administered 2024-03-15: 1 [IU] via INTRAVENOUS
  Administered 2024-03-15: 2 [IU] via INTRAVENOUS
  Administered 2024-03-15: 1 [IU] via INTRAVENOUS

## 2024-03-15 MED ORDER — ROCURONIUM BROMIDE 10 MG/ML (PF) SYRINGE
PREFILLED_SYRINGE | INTRAVENOUS | Status: AC
  Filled 2024-03-15: qty 10

## 2024-03-15 MED ORDER — PHENYLEPHRINE 80 MCG/ML (10ML) SYRINGE FOR IV PUSH (FOR BLOOD PRESSURE SUPPORT)
PREFILLED_SYRINGE | INTRAVENOUS | Status: DC | PRN
  Administered 2024-03-15 (×2): 80 ug via INTRAVENOUS
  Administered 2024-03-15: 160 ug via INTRAVENOUS
  Administered 2024-03-15: 80 ug via INTRAVENOUS
  Administered 2024-03-15: 160 ug via INTRAVENOUS

## 2024-03-15 MED ORDER — DEXAMETHASONE SOD PHOSPHATE PF 10 MG/ML IJ SOLN
INTRAMUSCULAR | Status: DC | PRN
  Administered 2024-03-15: 10 mg via INTRAVENOUS

## 2024-03-15 MED ORDER — FENTANYL CITRATE (PF) 100 MCG/2ML IJ SOLN
INTRAMUSCULAR | Status: DC | PRN
  Administered 2024-03-15: 50 ug via INTRAVENOUS

## 2024-03-15 MED ORDER — OXYCODONE HCL 5 MG/5ML PO SOLN: 5.0000 mg | Freq: Once | ORAL | Status: DC | PRN

## 2024-03-15 MED ORDER — MIDAZOLAM HCL (PF) 2 MG/2ML IJ SOLN
INTRAMUSCULAR | Status: DC | PRN
Start: 2024-03-15 — End: 2024-03-15
  Administered 2024-03-15: 2 mg via INTRAVENOUS

## 2024-03-15 NOTE — Anesthesia Postprocedure Evaluation (Signed)
 Anesthesia Post Note  Patient: ANEL PUROHIT  Procedure(s) Performed: BRONCHOSCOPY, WITH EBUS (Bilateral)  Patient location during evaluation: PACU Anesthesia Type: General Level of consciousness: awake and alert Pain management: pain level controlled Vital Signs Assessment: post-procedure vital signs reviewed and stable Respiratory status: spontaneous breathing, nonlabored ventilation, respiratory function stable and patient connected to nasal cannula oxygen Cardiovascular status: blood pressure returned to baseline and stable Postop Assessment: no apparent nausea or vomiting Anesthetic complications: no   There were no known notable events for this encounter.   Last Vitals:  Vitals:   03/15/24 1300 03/15/24 1315  BP: (!) 75/50 (!) 83/63  Pulse: 93 98  Resp: (!) 22 18  Temp:    SpO2: 98% 99%    Last Pain:  Vitals:   03/15/24 1315  TempSrc:   PainSc: 0-No pain                 Lendia LITTIE Mae

## 2024-03-15 NOTE — Transfer of Care (Signed)
 Immediate Anesthesia Transfer of Care Note  Patient: Ruben Garcia  Procedure(s) Performed: BRONCHOSCOPY, WITH EBUS (Bilateral)  Patient Location: PACU  Anesthesia Type:General  Level of Consciousness: awake  Airway & Oxygen Therapy: Patient Spontanous Breathing  Post-op Assessment: Report given to RN and Post -op Vital signs reviewed and stable  Post vital signs: Reviewed and stable  Last Vitals:  Vitals Value Taken Time  BP 86/59 03/15/24 12:47  Temp    Pulse 86 03/15/24 12:50  Resp 12 03/15/24 12:50  SpO2 94 % 03/15/24 12:50  Vitals shown include unfiled device data.  Last Pain:  Vitals:   03/15/24 0941  TempSrc: Temporal  PainSc: 0-No pain         Complications: There were no known notable events for this encounter.

## 2024-03-15 NOTE — Interval H&P Note (Addendum)
 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  Previously noted to have hilar and mediastinal lymphadenopathy and airway involvement presumed to be due to lymphoma.  He has been experiencing progressive respiratory symptoms with shortness of breath and wheezing with repeat imaging showing further narrowing of the right mainstem bronchus.   Patient is presenting for airway inspection and evaluation with consideration for biopsy of any endobronchial lesion as well as EBUS with TBNA for biopsy of any hilar or mediastinal lymph nodes to rule out secondary primary malignancy.  Plan for flexible bronchoscopy for airway inspection with endobronchial biopsies and brushings as indicated followed by EBUS with TBNA as indicated.  Will also consider endobronchial balloon dilation to relieve airway obstruction.  Risks and benefits of the procedure were discussed with the patient and his wife and he consents to proceed.  All the questions were answered.  [name], MD [facility] 03/15/2024 11:11 AM

## 2024-03-15 NOTE — Anesthesia Preprocedure Evaluation (Signed)
 Anesthesia Evaluation  Patient identified by MRN, date of birth, ID band Patient awake    Reviewed: Allergy & Precautions, NPO status , Patient's Chart, lab work & pertinent test results  History of Anesthesia Complications Negative for: history of anesthetic complications  Airway Mallampati: III  TM Distance: >3 FB Neck ROM: full    Dental no notable dental hx.    Pulmonary shortness of breath   Pulmonary exam normal        Cardiovascular hypertension, negative cardio ROS Normal cardiovascular exam     Neuro/Psych  Neuromuscular disease  negative psych ROS   GI/Hepatic negative GI ROS, Neg liver ROS,,,  Endo/Other  negative endocrine ROS    Renal/GU      Musculoskeletal   Abdominal   Peds  Hematology  (+) Blood dyscrasia, anemia   Anesthesia Other Findings Past Medical History: No date: Allergic rhinitis, cause unspecified No date: Anemia 04/28/2010: Basal cell carcinoma of skin, site unspecified     Comment:  Nasal; [place]. No date: BPH (benign prostatic hypertrophy) No date: Chronic cough No date: Colon polyps 2025: Diffuse large B-cell lymphoma of intra-abdominal lymph nodes  (HCC) No date: Dyspnea No date: Essential hypertension, benign No date: Hemorrhoids, internal No date: Incomplete bladder emptying No date: Narrowing of airway No date: Other abnormal glucose No date: Over weight No date: Personal history of colonic polyps No date: Personal history of other genital system and obstetric  disorders(V13.29) No date: Pneumonia No date: Pure hypercholesterolemia No date: Unspecified disorder of kidney and ureter No date: Unspecified disorder of liver  Past Surgical History: 2012: BASAL CELL CARCINOMA EXCISION     Comment:  Facial        [place] 11/13/2010: COLONOSCOPY     Comment:  single polyp; repeatin 5 years; Viktoria 11/04/2021: COLONOSCOPY; N/A     Comment:  Procedure: COLONOSCOPY;   Surgeon: Maryruth Ole DASEN,               MD;  Location: [facility] ENDOSCOPY;  Service: Endoscopy;                Laterality: N/A;  REQUEST EARLY TIME 12/13/2015: COLONOSCOPY WITH PROPOFOL ; N/A     Comment:  Procedure: COLONOSCOPY WITH PROPOFOL ;  Surgeon: Lamar DASEN Viktoria, MD;  Location: [facility] ENDOSCOPY;  Service:               Endoscopy;  Laterality: N/A; 05/2012: ear surgery     Comment:  [name].  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  Benign. 1978: HEMORRHOID SURGERY     Comment:  Claudene 12/04/2023: IR IMAGING GUIDED PORT INSERTION 1982: LAPAROSCOPIC CHOLECYSTECTOMY 05/14/2016: rotator cuff surgery; Right 1978: VASECTOMY     Reproductive/Obstetrics negative OB ROS                              Anesthesia Physical Anesthesia Plan  ASA: 3  Anesthesia Plan: General ETT   Post-op Pain Management: Toradol IV (intra-op)* and Ofirmev  IV (intra-op)*   Induction: Intravenous  PONV Risk Score and Plan: 1 and Ondansetron , Dexamethasone , Treatment may vary due to age or medical condition, Propofol  infusion and TIVA  Airway Management Planned: Oral ETT  Additional Equipment:   Intra-op Plan:   Post-operative Plan: Extubation in OR  Informed Consent: I have reviewed the patients History and Physical, chart, labs and discussed the procedure including the risks, benefits and alternatives for  the proposed anesthesia with the patient or authorized representative who has indicated his/her understanding and acceptance.     Dental Advisory Given  Plan Discussed with: Anesthesiologist, CRNA and Surgeon  Anesthesia Plan Comments: (Patient consented for risks of anesthesia including but not limited to:  - adverse reactions to medications - damage to eyes, teeth, lips or other oral mucosa - nerve damage due to positioning  - sore throat or hoarseness - Damage to heart, brain, nerves, lungs, other parts of body or loss of life  Patient voiced understanding and assent.)          Anesthesia Quick Evaluation

## 2024-03-15 NOTE — Op Note (Signed)
 Flexible and EBUS Bronchoscopy Procedure Note  Ruben Garcia  [id number]  10-01-1945  Date:03/15/24  Time:12:35 PM   Provider Performing:Bettye Sitton   Procedure: Flexible bronchoscopy and EBUS Bronchoscopy  Indication(s) Lymphadenopathy, narrowed bronchus intermedius  Consent Risks of the procedure as well as the alternatives and risks of each were explained to the patient and/or caregiver.  Consent for the procedure was obtained.  Anesthesia General Anesthesia   Time Out Verified patient identification, verified procedure, site/side was marked, verified correct patient position, special equipment/implants available, medications/allergies/relevant history reviewed, required imaging and test results available.   Sterile Technique Usual hand hygiene, masks, gowns, and gloves were used   Procedure Description Diagnostic bronchoscope advanced through endotracheal tube and into airway.  Airways were examined down to subsegmental level with findings noted below.  The diagnostic bronchoscope was then removed and the EBUS bronchoscope was advanced into airway with stations 7 biopsied and sent for cytology.  The EBUS bronchoscope was removed after assuring no active bleeding from biopsy site.  The flexible bronchoscope was then introduced into the airway.  A [employer] CRE pulmonary balloon dilation catheter size 8 mm to 10 mm was then introduced through the working channel of the bronchoscope.  The catheter was passed through the narrowed bronchus intermedius and initially inflated to 3 atm and left for 15 seconds.  Following this, the pressure was increased to 5 atm and left for 15 seconds.  We then removed the balloon after deflation.  There was improved stenosis with mild oozing of blood.  This was treated locally with 2% epinephrine  with lidocaine  with improvement.  Following this, a protected brush was passed through the bronchoscope and the area of stenosis was brushed and  this was sent for cytology.  The CRE balloon was reintroduced and the bronchus intermedius narrowing was again dilated with 3 atm for 15 seconds and then 5 atm for 15 seconds.  Following this, there was improvement in the stenosis and the lower lobe proper was visualized and bronchoscope able to be passed through the narrowing.  The lower lobes were examined to the segmental level with no endobronchial lesions found.  Findings: The left tracheobronchial tree was examined to the segmental and subsegmental level without any endobronchial lesions.  On the right, there are treatment changes from prior involvement of the airway by lymphoma with scarring most notable in the bronchus intermedius with significant stenosis.  This was dilated with a CRE balloon with around 60 to 70% improvement in the stenosis.  EBUS performed to station 7.  EBUS to station 7:    Trachea:    Left mainstem:    LUL    Lingula    LLL    Right Mainstem:    RUL:    Narrowed Bronchus Intermedius:    CRE balloon in BI:    BI post dilation:    RLL     Complications/Tolerance None; patient tolerated the procedure well. Chest X-ray is needed post procedure.   EBL Minimal   Specimen(s) EBUS to station 7 Brushing of BI  [name], MD [facility] 03/15/2024 12:45 PM

## 2024-03-15 NOTE — Anesthesia Procedure Notes (Signed)
 Procedure Name: Intubation Date/Time: 03/15/2024 11:48 AM  Performed by: Bonnetta Jimmey SAUNDERS, CRNAPre-anesthesia Checklist: Patient identified, Emergency Drugs available, Suction available and Patient being monitored Patient Re-evaluated:Patient Re-evaluated prior to induction Oxygen Delivery Method: Circle system utilized Preoxygenation: Pre-oxygenation with 100% oxygen Induction Type: IV induction Ventilation: Oral airway inserted - appropriate to patient size and Two handed mask ventilation required Laryngoscope Size: Glidescope and 4 Grade View: Grade I Tube type: Oral Tube size: 8.5 mm Number of attempts: 1 Airway Equipment and Method: Stylet and Oral airway Placement Confirmation: ETT inserted through vocal cords under direct vision, positive ETCO2 and breath sounds checked- equal and bilateral Secured at: 23 cm Tube secured with: Tape Dental Injury: Teeth and Oropharynx as per pre-operative assessment

## 2024-03-16 ENCOUNTER — Encounter: Payer: Self-pay | Admitting: Student in an Organized Health Care Education/Training Program

## 2024-03-16 ENCOUNTER — Other Ambulatory Visit: Payer: Self-pay | Admitting: *Deleted

## 2024-03-16 DIAGNOSIS — C8333 Diffuse large B-cell lymphoma, intra-abdominal lymph nodes: Secondary | ICD-10-CM

## 2024-03-16 NOTE — Assessment & Plan Note (Signed)
#    DLBCL- NON-GCB sub type- STAGE IV- JULY 2025- 12/07/2023- PET scan-   Hypermetabolic right hilar and subcarinal lymphadenopathy consistent with lymphoma. Hypermetabolic disease involving the left iliac bone and adjacent iliopsoas and gluteus musculature; Hypermetabolic disease involving the bony anatomy of the pelvis and proximal femurs bilaterally, proximal left femoral diaphysis, left-sided ribs, lumbar spine, and sacral spine as well as parasacral soft tissues.   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   KI-67 IS ESSENTIALLY 100%. 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   hepatitis panel NEG. AUG 19th, 2025-  MUGA scan- left ventricular ejection fraction equals 53.5 %. 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 # s/p 2 cycles of Chemo- PET scan: sep 23rd, 2025- . Marked, near complete response to therapy of lymphoma. The only residual active disease is centered about the proximal right main stem bronchus, as evidenced by soft tissue thickening and hypermetabolism. dauville score 4.   # proceed with cycle # 5 POLA R-CHP chemotherapy every 3 weeks x 6 cycles. Will odse reduce cytoxan  to 650mg /2; and dox to 40 mg/m2  Labs-CBC/chemistries were reviewed with the patient.  Will repeat a PET scan after cycle #6.  Will also proceed with intrathecal IT prophylaxis starting 11/21.   # CT scan October 2025/chest neutropenic fever- ?  Progression of the right mainstem bronchus narrowing-status post bronchoscopy [Dr.Dgyali]-clinically not suggestive of progressive disease but from scarring- will  follow up with PET after 6 cycles. Nov 2025- cytology-negative for cancer  #  Diarrhea- G-2 sec to chemo- continue prn immoiudm/lomitil prn.   # ID prophylaxis-recommend acyclovir  400 twice daily; Bactrim  Monday Wednesday Friday.  Tumor lysis prophylaxis-allopurinol  300 mg a day- stable.    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 continue Percocet prn.  stable.   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   #Incidental findings on Imaging  PET- CT , 2025:  Similar size of a 1.5 cm left renal lesion which measures greater than fluiddensity. Complex cyst is favored over a solid neoplasm, given lack of significant activity-  pre and post-contrast abdominal MRI- will plan down the line   # IV access: s/p Mediport placement.   PS- PET-costs # DISPOSITION: # chemo today; d2- shot& IT chemo # follow in 3  weeks- MD port-labs- cbc/cmp; LDH;  chemo- d-2 injection-; day D-2 IT chemo with IR-      Dr.B

## 2024-03-16 NOTE — Progress Notes (Unsigned)
 [facility] CONSULT NOTE  Patient Care Team: Myrla Jon HERO, MD as PCP - General (Family Medicine) Verdene Gills, RN as Oncology Nurse Navigator [name], MD as Consulting Physician (Oncology)  CHIEF COMPLAINTS/PURPOSE OF CONSULTATION: DLBCL   Oncology History Overview Note    #  STAGE IV- JULY 2025- CT chest- Right hilar mass/mediastinal adenopathy.  MRI lumbar spine shows-L2 lesion.  MRI of the pelvis- Substantial metastatic burden to the bilateral iliac bones, right ischium, bilateral posterior acetabular walls, left pubic bone, central and right sacrum, bilateral femoral neck, and bilateral intertrochanteric region. In particular, tumor in the proximal femurs  Extraosseous extension of tumor from the dominant left iliac lesion, tracking along the left iliopsoas and upper pelvic sidewall; posteriorly along the gluteus minimus and gluteus medius, and even posteriorly along the lower paraspinal and medial left gluteus maximus musculature; Extraosseous extension of tumor from the right iliac lesion, tracking along the right iliopsoas and right gluteus minimus. Tumor along the attachment site of the right rectus femoris muscle, which could predispose to avulsion. Scattered small pelvic lymph nodes; Mildly prominent median lobe of prostate gland indents the bladder base.  12/07/2023- PET scan-   Hypermetabolic right hilar and subcarinal lymphadenopathy consistent with lymphoma. Hypermetabolic disease involving the left iliac bone and adjacent iliopsoas and gluteus musculature; Hypermetabolic disease involving the bony anatomy of the pelvis and proximal femurs bilaterally, proximal left femoral diaphysis, left-sided ribs, lumbar spine, and sacral spine as well as parasacral soft tissues.  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   KI-67 IS ESSENTIALLY 100%. TO EXCLUDE A SO-CALLED HIGH-GRADE B-CELL  LYMPHOMA/DOUBLE HIT LYMPHOMA FISH IS PENDING.   MUGA SCAN- 8/19-  hepatitis panel_NEG.     # AUG 14th, 2025-  rituximab  today-pending MUGA scan.   # AUG 21st, 2025- cycle #1 of chemotherapy POLA R-CHP    # Elevated AST ALT-question etiology.   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   DLBCL (diffuse large B cell lymphoma) (HCC)  12/01/2023 Initial Diagnosis   DLBCL (diffuse large B cell lymphoma) (HCC)   12/01/2023 Cancer Staging   Staging form: Hodgkin and Non-Hodgkin Lymphoma, AJCC 8th Edition - Clinical: Stage IV (Diffuse large B-cell lymphoma) - Signed by Rosaline Ezekiel R, MD on 12/01/2023   12/10/2023 -  Chemotherapy   Patient is on Treatment Plan : NON-HODGKINS LYMPHOMA POLA-R-CHP D1 X 6 cycles / Rituximab  D1 q21d X 2 cycles       HISTORY OF PRESENTING ILLNESS: Patient ambulating-in with a cane. accompanied by wife.   Ruben Garcia 78 y.o.  male pleasant patient with a diffuse large B-cell lymphoma stage IV is here to proceed with on [place]- RHP is here for a follow up.  Discussed the use of AI scribe software for clinical note transcription with the patient, who gave verbal consent to proceed.  History of Present Illness   Ruben Garcia is a 78 year old male with cancer who presents for follow-up after recent hospitalization for a viral infection and ongoing chemotherapy treatment.  He was recently hospitalized due to a viral infection characterized by a high fever of 105F and a low white blood cell count. Antibiotics were administered during the hospitalization,  although the source of the infection was not identified. Imaging during this period revealed scarring or blockage in the right lung, initially concerning for cancer, but later determined to be scar  tissue.  He is currently undergoing his fifth cycle of chemotherapy. His current medications include prednisone  for five days and Bactrim  on Monday, Wednesday, and Friday. Nausea medications are used as needed.  He experiences occasional cough, which is managed with cough medicine. An episode of cough occurred this morning, resolving after medication. He is running low on his cough medication and requests a refill.  He reports loose stools but denies diarrhea. He experiences hip pain, which is manageable with tramadol  as needed.  He is concerned about the cost of PET scans, as his insurance does not cover them, and he has had multiple scans already. He wants to ensure all cancer is gone but is mindful of the financial burden.      Review of Systems  Constitutional:  Positive for malaise/fatigue and weight loss. Negative for chills, diaphoresis and fever.  HENT:  Negative for nosebleeds and sore throat.   Eyes:  Negative for double vision.  Respiratory:  Negative for hemoptysis, sputum production, shortness of breath and wheezing.   Cardiovascular:  Negative for chest pain, palpitations, orthopnea and leg swelling.  Gastrointestinal:  Negative for abdominal pain, blood in stool, constipation, diarrhea, heartburn, melena, nausea and vomiting.  Genitourinary:  Negative for dysuria, frequency and urgency.  Musculoskeletal:  Positive for back pain, joint pain and myalgias.  Skin: Negative.  Negative for itching and rash.  Neurological:  Negative for dizziness, tingling, focal weakness, weakness and headaches.  Endo/Heme/Allergies:  Does not bruise/bleed easily.  Psychiatric/Behavioral:  Negative for depression. The patient is not nervous/anxious and does not have insomnia.     MEDICAL HISTORY:  Past Medical History:  Diagnosis Date   Allergic rhinitis, cause unspecified    Anemia    Basal cell carcinoma of skin, site unspecified 04/28/2010   Nasal; [place].   BPH (benign prostatic  hypertrophy)    Chronic cough    Colon polyps    Diffuse large B-cell lymphoma of intra-abdominal lymph nodes (HCC) 2025   Dyspnea    Essential hypertension, benign    Hemorrhoids, internal    Incomplete bladder emptying    Narrowing of airway    Other abnormal glucose    Over weight    Personal history of colonic polyps    Personal history of other genital system and obstetric disorders(V13.29)    Pneumonia    Pure hypercholesterolemia    Unspecified disorder of kidney and ureter    Unspecified disorder of liver     SURGICAL HISTORY: Past Surgical History:  Procedure Laterality Date   BASAL CELL CARCINOMA EXCISION  2012   Facial        [place]   COLONOSCOPY  11/13/2010   single polyp; repeatin 5 years; [place]   COLONOSCOPY N/A 11/04/2021   Procedure: COLONOSCOPY;  Surgeon: Maryruth Ole DASEN, MD;  Location: [facility] ENDOSCOPY;  Service: Endoscopy;  Laterality: N/A;  REQUEST EARLY TIME   COLONOSCOPY WITH PROPOFOL  N/A 12/13/2015   Procedure: COLONOSCOPY WITH PROPOFOL ;  Surgeon: Lamar DASEN Viktoria, MD;  Location: [facility];  Service: Endoscopy;  Laterality: N/A;   ear surgery  05/2012   [name].  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  Benign.   HEMORRHOID SURGERY  1978   [name]   IR IMAGING GUIDED PORT INSERTION  12/04/2023   LAPAROSCOPIC CHOLECYSTECTOMY  1982   rotator cuff surgery  Right 05/14/2016   VASECTOMY  1978   VIDEO BRONCHOSCOPY WITH ENDOBRONCHIAL ULTRASOUND Bilateral 03/15/2024   Procedure: BRONCHOSCOPY, WITH EBUS;  Surgeon: Isadora Hose, MD;  Location: [facility] ORS;  Service: Pulmonary;  Laterality: Bilateral;    SOCIAL HISTORY: Social History   Socioeconomic History   Marital status: Married    Spouse name: Heron   Number of children: 2   Years of education: 2 years college   Highest education level: Not on file  Occupational History   Occupation: retired    Comment: postal service in 2005  Tobacco Use   Smoking status: Never   Smokeless tobacco: Never  Vaping Use    Vaping status: Never Used  Substance and Sexual Activity   Alcohol use: Not Currently    Alcohol/week: 5.0 standard drinks of alcohol    Types: 5 Cans of beer per week    Comment: weekends beer, 4- 6 total Cleotilde Mallory   Drug use: No   Sexual activity: Not Currently    Partners: Female  Other Topics Concern   Not on file  Social History Narrative   Always uses seat belts. Smoke alarm and carbon monoxide detector in the home. Guns in the home stored in locked cabinet.Caffeine use: Coffee 2 servings per day, moderate amount. Exercise: Moderate walking 2 - 4 miles daily, 2 - 3 days per week.    Marital status:  Married x 47 years, happily.      Children:  2 children; 1 grandchild (20).      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      Tobacco: never      Alcohol:  Weekends; beer 4-6 per weekend.      Exercise:  Walking every other day 2.5 miles three times per day.      Seatbelt: 100%; no texting      Guns: secured guns.      Living Will: completed in 2014.  FULL CODE but no prolonged measures.        ADLs: independent with ADLs; no assistant devices   Social Drivers of Health   Financial Resource Strain: Not on file  Food Insecurity: No Food Insecurity (03/04/2024)   Hunger Vital Sign    Worried About Running Out of Food in the Last Year: Never true    Ran Out of Food in the Last Year: Never true  Transportation Needs: No Transportation Needs (03/04/2024)   PRAPARE - Administrator, Civil Service (Medical): No    Lack of Transportation (Non-Medical): No  Physical Activity: Not on file  Stress: Not on file  Social Connections: Moderately Integrated (02/27/2024)   Social Connection and Isolation Panel    Frequency of Communication with Friends and Family: Twice a week    Frequency of Social Gatherings with Friends and Family: Twice a week    Attends Religious Services: Patient declined    Database Administrator or Organizations: No    Attends Museum/gallery Exhibitions Officer: 1 to 4 times per year    Marital Status: Married  Catering Manager Violence: Not At Risk (03/04/2024)   Humiliation, Afraid, Rape, and Kick questionnaire    Fear of Current or Ex-Partner: No    Emotionally Abused: No    Physically Abused: No    Sexually Abused: No    FAMILY HISTORY: Family History  Problem Relation Age of Onset   Heart disease Mother        first MI in 44s  Diabetes Mother    Hyperlipidemia Mother    Arthritis Mother        rheumatoid   Cancer Father        lung   Diabetes Father    Arthritis Sister    Hyperlipidemia Sister    Hyperlipidemia Sister    Hypertension Sister    Hyperlipidemia Sister    Kidney disease Neg Hx    Prostate cancer Neg Hx    Colon cancer Neg Hx     ALLERGIES:  is allergic to codeine.  MEDICATIONS:  Current Outpatient Medications  Medication Sig Dispense Refill   acetaminophen  (TYLENOL ) 325 MG tablet Take 2 tablets (650 mg total) by mouth every 6 (six) hours as needed for mild pain (pain score 1-3), fever, headache or moderate pain (pain score 4-6) (or Fever >/= 101).     acyclovir  (ZOVIRAX ) 400 MG tablet Take 1 tablet (400 mg total) by mouth 2 (two) times daily. [to prevent shingles] 60 tablet 3   albuterol  (VENTOLIN  HFA) 108 (90 Base) MCG/ACT inhaler Inhale 1-2 puffs into the lungs every 6 (six) hours as needed. 18 g 0   allopurinol  (ZYLOPRIM ) 300 MG tablet Take 1 tablet (300 mg total) by mouth 2 (two) times daily. 120 tablet 0   benzonatate  (TESSALON ) 100 MG capsule Take 1-2 capsules (100-200 mg total) by mouth 3 (three) times daily as needed for cough. 90 capsule 0   chlorpheniramine-HYDROcodone  (TUSSIONEX) 10-8 MG/5ML Take 5 mLs by mouth at bedtime as needed for cough. 140 mL 0   chlorpheniramine-HYDROcodone  (TUSSIONEX) 10-8 MG/5ML Take 5 mLs by mouth at bedtime as needed for cough.     cyclobenzaprine  (FLEXERIL ) 10 MG tablet Take 1 tablet (10 mg total) by mouth 3 (three) times daily as needed for muscle  spasms. 30 tablet 2   gabapentin  (NEURONTIN ) 300 MG capsule Take 1 capsule (300 mg total) by mouth at bedtime. 90 capsule 0   lactulose  (CHRONULAC ) 10 GM/15ML solution Take 15 mLs (10 g total) by mouth daily as needed for mild constipation. 236 mL 0   ondansetron  (ZOFRAN ) 4 MG tablet Take 1 tablet (4 mg total) by mouth every 8 (eight) hours as needed for nausea or vomiting. 30 tablet 0   prochlorperazine  (COMPAZINE ) 10 MG tablet Take 1 tablet (10 mg total) by mouth every 6 (six) hours as needed for nausea or vomiting. 30 tablet 0   psyllium (HYDROCIL/METAMUCIL) 95 % PACK Take 1 packet by mouth daily.     traMADol  (ULTRAM ) 50 MG tablet Take 1 tablet (50 mg total) by mouth every 6 (six) hours as needed. 30 tablet 0   diphenoxylate -atropine  (LOMOTIL ) 2.5-0.025 MG tablet Take 1 tablet by mouth 4 (four) times daily as needed for diarrhea or loose stools. Diarrhea not responding to imodium  (Patient not taking: Reported on 03/17/2024) 45 tablet 1   HYDROcodone  bit-homatropine (HYDROMET) 5-1.5 MG/5ML syrup Take 5 mLs by mouth every 6 (six) hours as needed for cough. 120 mL 0   loperamide  (IMODIUM  A-D) 2 MG tablet Take 1 tablet (2 mg total) by mouth 4 (four) times daily as needed for diarrhea or loose stools. (Patient not taking: Reported on 03/17/2024)     nystatin  (MYCOSTATIN ) 100000 UNIT/ML suspension Take 5 mLs (500,000 Units total) by mouth 4 (four) times daily. (Patient not taking: Reported on 03/17/2024) 473 mL 0   potassium chloride  SA (KLOR-CON  M) 20 MEQ tablet Take 1 tablet (20 mEq total) by mouth daily. (Patient not taking: Reported on 03/17/2024) 7 tablet 0   [  Paused] predniSONE  (DELTASONE ) 50 MG tablet Take 2 tablets once day x 5 days. START on day of your chemotherapy. Take in AM; with FOOD. 10 tablet 5   No current facility-administered medications for this visit.   Facility-Administered Medications Ordered in Other Visits  Medication Dose Route Frequency Provider Last Rate Last Admin   0.9 %   sodium chloride  infusion   Intravenous Continuous Charma Mocarski R, MD       cyclophosphamide  (CYTOXAN ) 1,280 mg in sodium chloride  0.9 % 250 mL chemo infusion  600 mg/m2 (Treatment Plan Recorded) Intravenous Once Sulayman Manning R, MD       DOXOrubicin  (ADRIAMYCIN ) chemo injection 86 mg  40 mg/m2 (Treatment Plan Recorded) Intravenous Once Kambrie Eddleman R, MD       famotidine  (PEPCID ) IVPB 20 mg premix  20 mg Intravenous Once Alper Guilmette R, MD       polatuzumab vedotin -piiq (POLIVY ) 160 mg in sodium chloride  0.9 % 100 mL (1.4815 mg/mL) chemo infusion  160 mg Intravenous Once Jaretzy Lhommedieu R, MD       riTUXimab -pvvr (RUXIENCE ) 800 mg in sodium chloride  0.9 % 170 mL infusion  375 mg/m2 (Treatment Plan Recorded) Intravenous Once Noris Kulinski R, MD        PHYSICAL EXAMINATION:   Vitals:   03/17/24 0837  BP: 107/68  Pulse: 88  Resp: 18  Temp: (!) 97.2 F (36.2 C)  SpO2: 99%    Filed Weights   03/17/24 0837  Weight: 179 lb (81.2 kg)    Patient is wheezing bilaterally.  Physical Exam Vitals and nursing note reviewed.  HENT:     Head: Normocephalic and atraumatic.     Mouth/Throat:     Pharynx: Oropharynx is clear.  Eyes:     Extraocular Movements: Extraocular movements intact.     Pupils: Pupils are equal, round, and reactive to light.  Cardiovascular:     Rate and Rhythm: Normal rate and regular rhythm.  Pulmonary:     Comments: Decreased breath sounds bilaterally.  Abdominal:     Palpations: Abdomen is soft.  Musculoskeletal:        General: Normal range of motion.     Cervical back: Normal range of motion.  Skin:    General: Skin is warm.  Neurological:     General: No focal deficit present.     Mental Status: He is alert and oriented to person, place, and time.  Psychiatric:        Behavior: Behavior normal.        Judgment: Judgment normal.     LABORATORY DATA:  I have reviewed the data as listed Lab Results  Component  Value Date   WBC 8.2 03/17/2024   HGB 10.8 (L) 03/17/2024   HCT 32.7 (L) 03/17/2024   MCV 98.8 03/17/2024   PLT 247 03/17/2024   Recent Labs    03/31/23 0930 10/08/23 0900 11/19/23 1347 11/20/23 0511 02/26/24 1307 02/26/24 1352 02/27/24 0456 02/28/24 0544 02/29/24 0747 03/15/24 1006 03/17/24 0824  NA 143   < >  --    < > 126* 126*   < > 132* 136 138 138  K 4.9   < >  --    < > 3.9 3.9   < > 2.9* 3.2* 3.9 3.7  CL 103   < >  --    < > 93* 93*   < > 100 100 105 106  CO2 26   < >  --    < >  22 21*   < > 25 22  --  24  GLUCOSE 103*   < >  --    < > 129* 143*   < > 90 97 118* 122*  BUN 11   < >  --    < > 16 16   < > 7* <5* 7* 12  CREATININE 1.21   < >  --    < > 0.95 1.09   < > 0.45* 0.65 0.80 0.81  CALCIUM  10.0   < >  --    < > 7.8* 7.8*   < > 7.6* 7.9*  --  8.2*  GFRNONAA  --    < >  --    < > >60 >60   < > >60 >60  --  >60  PROT 7.4   < >  --    < > 6.4* 6.7  --   --   --   --  6.3*  ALBUMIN 4.7   < >  --    < > 3.1* 3.3*  --   --   --   --  3.6  AST 34   < >  --    < > 36 36  --   --   --   --  25  ALT 38   < >  --    < > 66* 69*  --   --   --   --  24  ALKPHOS 52   < >  --    < > 70 66  --   --   --   --  43  BILITOT 1.7*  1.9*   < > 2.5*   < > 2.4* 2.5*  --   --   --   --  0.9  BILIDIR 0.40  --  0.3*  --   --   --   --   --   --   --   --   IBILI 1.30*  --  2.2*  --   --   --   --   --   --   --   --    < > = values in this interval not displayed.    RADIOGRAPHIC STUDIES: I have personally reviewed the radiological images as listed and agreed with the findings in the report. DG Chest Port 1 View Result Date: 03/15/2024 CLINICAL DATA:  Status post bronchoscopy. EXAM: PORTABLE CHEST 1 VIEW COMPARISON:  02/26/2024. FINDINGS: The heart size and mediastinal contours are unchanged. Aortic atherosclerosis. Stable right chest Port-A-Cath tip overlies the lower SVC. No pneumothorax. No pleural effusion. No focal consolidation. No acute osseous abnormality. IMPRESSION: No acute  cardiopulmonary findings. Electronically Signed   By: Harrietta Sherry M.D.   On: 03/15/2024 13:34   CT CHEST ABDOMEN PELVIS W CONTRAST Result Date: 02/26/2024 EXAM: CT CHEST, ABDOMEN AND PELVIS WITH CONTRAST 02/26/2024 03:22:48 PM TECHNIQUE: CT of the chest, abdomen and pelvis was performed with the administration of 100 mL of iohexol (OMNIPAQUE) 300 MG/ML solution. Multiplanar reformatted images are provided for review. Automated exposure control, iterative reconstruction, and/or weight based adjustment of the mA/kV was utilized to reduce the radiation dose to as low as reasonably achievable. COMPARISON: PET CT scan 01/20/2024. CLINICAL HISTORY: Sepsis. Weakness. Fever. Patient presents from cancer center. Chemotherapy ongoing. Lymphoma. FINDINGS: CHEST: MEDIASTINUM AND LYMPH NODES: Port in the anterior chest wall with tip in distal SVC. There is narrowing of the right bronchus intermedius  with inflammatory thickening surrounding the bronchus. The bronchial narrowing has progressed from comparison PET CT scan. This region was hypermetabolic on comparison PET scan. Heart and pericardium are unremarkable. No mediastinal, hilar or axillary lymphadenopathy. LUNGS AND PLEURA: No evidence of pulmonary infection. No pulmonary nodularity. No focal consolidation or pulmonary edema. No pleural effusion or pneumothorax. ABDOMEN AND PELVIS: LIVER: The liver is unremarkable. GALLBLADDER AND BILE DUCTS: Post cholecystectomy. No biliary ductal dilatation. SPLEEN: No acute abnormality. PANCREAS: No acute abnormality. ADRENAL GLANDS: No acute abnormality. KIDNEYS, URETERS AND BLADDER: A solid lesion of the left kidney is again demonstrated, measuring 2.2 cm. This lesion was not hypermetabolic on comparison with a PET scan. Favor benign hemorrhagic cyst. No stones in the kidneys or ureters. No hydronephrosis. No perinephric or periureteral stranding. Urinary bladder is unremarkable. GI AND BOWEL: Stomach demonstrates no acute  abnormality. No evidence of bowel inflammation or obstruction. REPRODUCTIVE ORGANS: Prostate unremarkable. PERITONEUM AND RETROPERITONEUM: No ascites. No free air. VASCULATURE: Aorta is normal in caliber. ABDOMINAL AND PELVIS LYMPH NODES: No lymphadenopathy. BONES AND SOFT TISSUES: No aggressive osseous lesion. No acute osseous abnormality. No focal soft tissue abnormality. IMPRESSION: 1. No evidence of infection 2. Progression of narrowing of the right bronchus intermedius with surrounding inflammatory thickening, previously hypermetabolic on PET scan. 3. No evidence of metastatic disease. 4. Indeterminate 2.2 cm solid-appearing left renal lesion. Recommend attention on routine surveillance. Electronically signed by: Norleen Boxer MD 02/26/2024 04:01 PM EDT RP Workstation: [id number]   DG Chest Port 1 View Result Date: 02/26/2024 EXAM: 1 VIEW(S) XRAY OF THE CHEST 02/26/2024 02:04:00 PM COMPARISON: None available. CLINICAL HISTORY: Questionable sepsis - evaluate for abnormality FINDINGS: LINES, TUBES AND DEVICES: Right chest Port-A-Cath in place with tip overlying the expected region of the superior cavoatrial junction. LUNGS AND PLEURA: No focal pulmonary opacity. No pulmonary edema. No pleural effusion. No pneumothorax. HEART AND MEDIASTINUM: Atherosclerotic calcifications of the aortic arch. No acute abnormality of the cardiac and mediastinal silhouettes. BONES AND SOFT TISSUES: No acute osseous abnormality. IMPRESSION: 1. No acute cardiopulmonary process. Electronically signed by: Norleen Boxer MD 02/26/2024 02:43 PM EDT RP Workstation: [id number]     DLBCL (diffuse large B cell lymphoma) (HCC) #  DLBCL- NON-GCB sub type- STAGE IV- JULY 2025- 12/07/2023- PET scan-   Hypermetabolic right hilar and subcarinal lymphadenopathy consistent with lymphoma. Hypermetabolic disease involving the left iliac bone and adjacent iliopsoas and gluteus musculature; Hypermetabolic disease involving the bony anatomy of the  pelvis and proximal femurs bilaterally, proximal left femoral diaphysis, left-sided ribs, lumbar spine, and sacral spine as well as parasacral soft tissues.   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   KI-67 IS ESSENTIALLY 100%. 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   hepatitis panel NEG. AUG 19th, 2025-  MUGA scan- left ventricular ejection fraction equals 53.5 %. 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 # s/p 2 cycles of Chemo- PET scan: sep 23rd, 2025- . Marked, near complete response to therapy of lymphoma. The only residual active disease is centered about the proximal right main stem bronchus, as evidenced by soft tissue thickening and hypermetabolism. dauville score 4.   #  proceed with cycle # 5 POLA R-CHP chemotherapy every 3 weeks x 6 cycles. Will odse reduce cytoxan  to 650mg /2; and dox to 40 mg/m2  Labs-CBC/chemistries were reviewed with the patient.  Will repeat a PET scan after cycle #6.  Will also proceed with intrathecal IT prophylaxis starting 11/21.   # CT scan October 2025/chest neutropenic fever- ?  Progression of the right mainstem bronchus narrowing-status post bronchoscopy [Dr.Dgyali]-clinically not suggestive of progressive disease but from scarring- will follow up with PET after 6 cycles. Nov 2025- cytology-negative for cancer  #  Diarrhea- G-2 sec to chemo- continue prn immoiudm/lomitil prn.   # ID prophylaxis-recommend acyclovir  400 twice daily; Bactrim  Monday Wednesday Friday.  Tumor lysis prophylaxis-allopurinol  300 mg a day- stable.    # Lower back  pain-secondary to presumed malignancy involving the L2/sacrum--again discussed importance of walking with support given involvement of the femur. continue Percocet prn.  stable.   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   #Incidental findings on Imaging  PET- CT , 2025:  Similar size of a 1.5 cm left renal lesion which measures greater than fluiddensity. Complex cyst is favored over a solid neoplasm, given lack of significant activity-  pre and post-contrast abdominal MRI- will plan down the line   # IV access: s/p Mediport placement.   PS- PET-costs # DISPOSITION: # chemo today; d2- shot& IT chemo # follow in 3  weeks- MD port-labs- cbc/cmp; LDH;  chemo- d-2 injection-; day D-2 IT chemo with IR-      Dr.B      Cindy JONELLE Joe, MD 03/17/2024 9:40 AM

## 2024-03-17 ENCOUNTER — Inpatient Hospital Stay

## 2024-03-17 ENCOUNTER — Encounter: Payer: Self-pay | Admitting: Internal Medicine

## 2024-03-17 ENCOUNTER — Inpatient Hospital Stay: Attending: Internal Medicine | Admitting: Internal Medicine

## 2024-03-17 ENCOUNTER — Inpatient Hospital Stay: Attending: Internal Medicine

## 2024-03-17 VITALS — BP 107/68 | HR 88 | Temp 97.2°F | Resp 18 | Ht 72.0 in | Wt 179.0 lb

## 2024-03-17 VITALS — BP 112/68 | HR 78 | Resp 18

## 2024-03-17 DIAGNOSIS — R059 Cough, unspecified: Secondary | ICD-10-CM | POA: Diagnosis not present

## 2024-03-17 DIAGNOSIS — Z5112 Encounter for antineoplastic immunotherapy: Secondary | ICD-10-CM | POA: Diagnosis present

## 2024-03-17 DIAGNOSIS — Z7952 Long term (current) use of systemic steroids: Secondary | ICD-10-CM | POA: Insufficient documentation

## 2024-03-17 DIAGNOSIS — C8333 Diffuse large B-cell lymphoma, intra-abdominal lymph nodes: Secondary | ICD-10-CM | POA: Diagnosis not present

## 2024-03-17 DIAGNOSIS — Z5111 Encounter for antineoplastic chemotherapy: Secondary | ICD-10-CM | POA: Insufficient documentation

## 2024-03-17 DIAGNOSIS — Z79899 Other long term (current) drug therapy: Secondary | ICD-10-CM | POA: Diagnosis not present

## 2024-03-17 DIAGNOSIS — Z5189 Encounter for other specified aftercare: Secondary | ICD-10-CM | POA: Insufficient documentation

## 2024-03-17 DIAGNOSIS — Z792 Long term (current) use of antibiotics: Secondary | ICD-10-CM | POA: Insufficient documentation

## 2024-03-17 DIAGNOSIS — C83398 Diffuse large b-cell lymphoma of other extranodal and solid organ sites: Secondary | ICD-10-CM | POA: Insufficient documentation

## 2024-03-17 LAB — CBC WITH DIFFERENTIAL (CANCER CENTER ONLY)
Abs Immature Granulocytes: 0.17 K/uL — ABNORMAL HIGH (ref 0.00–0.07)
Basophils Absolute: 0.1 K/uL (ref 0.0–0.1)
Basophils Relative: 1 %
Eosinophils Absolute: 0 K/uL (ref 0.0–0.5)
Eosinophils Relative: 0 %
HCT: 32.7 % — ABNORMAL LOW (ref 39.0–52.0)
Hemoglobin: 10.8 g/dL — ABNORMAL LOW (ref 13.0–17.0)
Immature Granulocytes: 2 %
Lymphocytes Relative: 14 %
Lymphs Abs: 1.2 K/uL (ref 0.7–4.0)
MCH: 32.6 pg (ref 26.0–34.0)
MCHC: 33 g/dL (ref 30.0–36.0)
MCV: 98.8 fL (ref 80.0–100.0)
Monocytes Absolute: 0.7 K/uL (ref 0.1–1.0)
Monocytes Relative: 9 %
Neutro Abs: 6 K/uL (ref 1.7–7.7)
Neutrophils Relative %: 74 %
Platelet Count: 247 K/uL (ref 150–400)
RBC: 3.31 MIL/uL — ABNORMAL LOW (ref 4.22–5.81)
RDW: 15.6 % — ABNORMAL HIGH (ref 11.5–15.5)
WBC Count: 8.2 K/uL (ref 4.0–10.5)
nRBC: 0 % (ref 0.0–0.2)

## 2024-03-17 LAB — CMP (CANCER CENTER ONLY)
ALT: 24 U/L (ref 0–44)
AST: 25 U/L (ref 15–41)
Albumin: 3.6 g/dL (ref 3.5–5.0)
Alkaline Phosphatase: 43 U/L (ref 38–126)
Anion gap: 8 (ref 5–15)
BUN: 12 mg/dL (ref 8–23)
CO2: 24 mmol/L (ref 22–32)
Calcium: 8.2 mg/dL — ABNORMAL LOW (ref 8.9–10.3)
Chloride: 106 mmol/L (ref 98–111)
Creatinine: 0.81 mg/dL (ref 0.61–1.24)
GFR, Estimated: >60 mL/min (ref >60)
Glucose, Bld: 122 mg/dL — ABNORMAL HIGH (ref 70–99)
Potassium: 3.7 mmol/L (ref 3.5–5.1)
Sodium: 138 mmol/L (ref 135–145)
Total Bilirubin: 0.9 mg/dL (ref 0.0–1.2)
Total Protein: 6.3 g/dL — ABNORMAL LOW (ref 6.5–8.1)

## 2024-03-17 LAB — CYTOLOGY - NON PAP

## 2024-03-17 LAB — LACTATE DEHYDROGENASE: LDH: 187 U/L (ref 105–235)

## 2024-03-17 MED ORDER — SODIUM CHLORIDE 0.9 % IV SOLN
600.0000 mg/m2 | Freq: Once | INTRAVENOUS | Status: AC
  Administered 2024-03-17: 1280 mg via INTRAVENOUS
  Filled 2024-03-17: qty 64

## 2024-03-17 MED ORDER — ACETAMINOPHEN 325 MG PO TABS
650.0000 mg | ORAL_TABLET | Freq: Once | ORAL | Status: AC
  Administered 2024-03-17: 650 mg via ORAL
  Filled 2024-03-17: qty 2

## 2024-03-17 MED ORDER — APREPITANT 130 MG/18ML IV EMUL
130.0000 mg | Freq: Once | INTRAVENOUS | Status: AC
  Administered 2024-03-17: 130 mg via INTRAVENOUS
  Filled 2024-03-17: qty 18

## 2024-03-17 MED ORDER — SODIUM CHLORIDE 0.9 % IV SOLN
375.0000 mg/m2 | Freq: Once | INTRAVENOUS | Status: AC
  Administered 2024-03-17: 800 mg via INTRAVENOUS
  Filled 2024-03-17: qty 30

## 2024-03-17 MED ORDER — MONTELUKAST SODIUM 10 MG PO TABS
10.0000 mg | ORAL_TABLET | Freq: Once | ORAL | Status: AC
  Administered 2024-03-17: 10 mg via ORAL
  Filled 2024-03-17: qty 1

## 2024-03-17 MED ORDER — DEXAMETHASONE SOD PHOSPHATE PF 10 MG/ML IJ SOLN
10.0000 mg | Freq: Once | INTRAMUSCULAR | Status: AC
  Administered 2024-03-17: 10 mg via INTRAVENOUS

## 2024-03-17 MED ORDER — GABAPENTIN 300 MG PO CAPS: 300.0000 mg | ORAL_CAPSULE | Freq: Every day | ORAL | 0 refills | Status: AC

## 2024-03-17 MED ORDER — DIPHENHYDRAMINE HCL 25 MG PO CAPS
50.0000 mg | ORAL_CAPSULE | Freq: Once | ORAL | Status: AC
  Administered 2024-03-17: 50 mg via ORAL
  Filled 2024-03-17: qty 2

## 2024-03-17 MED ORDER — SODIUM CHLORIDE 0.9 % IV SOLN
INTRAVENOUS | Status: DC
  Filled 2024-03-17: qty 250

## 2024-03-17 MED ORDER — PALONOSETRON HCL INJECTION 0.25 MG/5ML
0.2500 mg | Freq: Once | INTRAVENOUS | Status: AC
  Administered 2024-03-17: 0.25 mg via INTRAVENOUS
  Filled 2024-03-17: qty 5

## 2024-03-17 MED ORDER — HYDROCODONE BIT-HOMATROP MBR 5-1.5 MG/5ML PO SOLN: 5.0000 mL | Freq: Four times a day (QID) | ORAL | 0 refills | Status: DC | PRN

## 2024-03-17 MED ORDER — SODIUM CHLORIDE (PF) 0.9 % IJ SOLN
Freq: Once | INTRAMUSCULAR | Status: AC
  Filled 2024-03-17: qty 0.48

## 2024-03-17 MED ORDER — SODIUM CHLORIDE 0.9 % IV SOLN
160.0000 mg | Freq: Once | INTRAVENOUS | Status: AC
  Administered 2024-03-17: 160 mg via INTRAVENOUS
  Filled 2024-03-17: qty 7

## 2024-03-17 MED ORDER — DOXORUBICIN HCL CHEMO IV INJECTION 2 MG/ML
40.0000 mg/m2 | Freq: Once | INTRAVENOUS | Status: AC
  Administered 2024-03-17: 86 mg via INTRAVENOUS
  Filled 2024-03-17: qty 43

## 2024-03-17 MED ORDER — FAMOTIDINE IN NACL 20-0.9 MG/50ML-% IV SOLN
20.0000 mg | Freq: Once | INTRAVENOUS | Status: AC
  Administered 2024-03-17: 20 mg via INTRAVENOUS
  Filled 2024-03-17: qty 50

## 2024-03-17 NOTE — Patient Instructions (Signed)
 [facility] - A DEPT OF [facility]  Discharge Instructions: Thank you for choosing [facility] to provide your oncology and hematology care.  If you have a lab appointment with the Cancer Center, please go directly to the Cancer Center and check in at the registration area.  Wear comfortable clothing and clothing appropriate for easy access to any Portacath or PICC line.   We strive to give you quality time with your provider. You may need to reschedule your appointment if you arrive late (15 or more minutes).  Arriving late affects you and other patients whose appointments are after yours.  Also, if you miss three or more appointments without notifying the office, you may be dismissed from the clinic at the provider's discretion.      For prescription refill requests, have your pharmacy contact our office and allow 72 hours for refills to be completed.    Today you received the following chemotherapy and/or immunotherapy agents RITUXAN , POLIVY , ADRIAMYCIN , CYTOXAN       To help prevent nausea and vomiting after your treatment, we encourage you to take your nausea medication as directed.  BELOW ARE SYMPTOMS THAT SHOULD BE REPORTED IMMEDIATELY: *FEVER GREATER THAN 100.4 F (38 C) OR HIGHER *CHILLS OR SWEATING *NAUSEA AND VOMITING THAT IS NOT CONTROLLED WITH YOUR NAUSEA MEDICATION *UNUSUAL SHORTNESS OF BREATH *UNUSUAL BRUISING OR BLEEDING *URINARY PROBLEMS (pain or burning when urinating, or frequent urination) *BOWEL PROBLEMS (unusual diarrhea, constipation, pain near the anus) TENDERNESS IN MOUTH AND THROAT WITH OR WITHOUT PRESENCE OF ULCERS (sore throat, sores in mouth, or a toothache) UNUSUAL RASH, SWELLING OR PAIN  UNUSUAL VAGINAL DISCHARGE OR ITCHING   Items with * indicate a potential emergency and should be followed up as soon as possible or go to the Emergency Department if any problems should occur.  Please show the CHEMOTHERAPY  ALERT CARD or IMMUNOTHERAPY ALERT CARD at check-in to the Emergency Department and triage nurse.  Should you have questions after your visit or need to cancel or reschedule your appointment, please contact [facility] - A DEPT OF [facility]  [phone] and follow the prompts.  Office hours are 8:00 a.m. to 4:30 p.m. Monday - Friday. Please note that voicemails left after 4:00 p.m. may not be returned until the following business day.  We are closed weekends and major holidays. You have access to a nurse at all times for urgent questions. Please call the main number to the clinic [phone] and follow the prompts.  For any non-urgent questions, you may also contact your provider using MyChart. We now offer e-Visits for anyone 17 and older to request care online for non-urgent symptoms. For details visit [email or web address].   Also download the MyChart app! Go to the app store, search MyChart, open the app, select [facility], and log in with your MyChart username and password.  Rituximab  Injection What is this medication? RITUXIMAB  (ri TUX i mab) treats leukemia and lymphoma. It works by blocking a protein that causes cancer cells to grow and multiply. This helps to slow or stop the spread of cancer cells. It may also be used to treat autoimmune conditions, such as arthritis. It works by slowing down an overactive immune system. It is a monoclonal antibody. This medicine may be used for other purposes; ask your health care provider or pharmacist if you have questions. COMMON BRAND NAME(S): RIABNI , Rituxan , RUXIENCE , truxima  What should I  tell my care team before I take this medication? They need to know if you have any of these conditions: Chest pain Heart disease Immune system problems Infection, such as chickenpox, cold sores, hepatitis B, herpes Irregular heartbeat or rhythm Kidney disease Low blood counts, such as low white cells, platelets, red  cells Lung disease Recent or upcoming vaccine An unusual or allergic reaction to rituximab , other medications, foods, dyes, or preservatives Pregnant or trying to get pregnant Breast-feeding How should I use this medication? This medication is injected into a vein. It is given by a care team in a hospital or clinic setting. A special MedGuide will be given to you before each treatment. Be sure to read this information carefully each time. Talk to your care team about the use of this medication in children. While this medication may be prescribed for children as young as 6 months for selected conditions, precautions do apply. Overdosage: If you think you have taken too much of this medicine contact a poison control center or emergency room at once. NOTE: This medicine is only for you. Do not share this medicine with others. What if I miss a dose? Keep appointments for follow-up doses. It is important not to miss your dose. Call your care team if you are unable to keep an appointment. What may interact with this medication? Do not take this medication with any of the following: Live vaccines This medication may also interact with the following: Cisplatin This list may not describe all possible interactions. Give your health care provider a list of all the medicines, herbs, non-prescription drugs, or dietary supplements you use. Also tell them if you smoke, drink alcohol, or use illegal drugs. Some items may interact with your medicine. What should I watch for while using this medication? Your condition will be monitored carefully while you are receiving this medication. You may need blood work while taking this medication. This medication can cause serious infusion reactions. To reduce the risk your care team may give you other medications to take before receiving this one. Be sure to follow the directions from your care team. This medication may increase your risk of getting an infection. Call  your care team for advice if you get a fever, chills, sore throat, or other symptoms of a cold or flu. Do not treat yourself. Try to avoid being around people who are sick. Call your care team if you are around anyone with measles, chickenpox, or if you develop sores or blisters that do not heal properly. Avoid taking medications that contain aspirin , acetaminophen , ibuprofen, naproxen, or ketoprofen unless instructed by your care team. These medications may hide a fever. This medication may cause serious skin reactions. They can happen weeks to months after starting the medication. Contact your care team right away if you notice fevers or flu-like symptoms with a rash. The rash may be red or purple and then turn into blisters or peeling of the skin. You may also notice a red rash with swelling of the face, lips, or lymph nodes in your neck or under your arms. In some patients, this medication may cause a serious brain infection that may cause death. If you have any problems seeing, thinking, speaking, walking, or standing, tell your care team right away. If you cannot reach your care team, urgently seek another source of medical care. Talk to your care team if you may be pregnant. Serious birth defects can occur if you take this medication during pregnancy and for 46  months after the last dose. You will need a negative pregnancy test before starting this medication. Contraception is recommended while taking this medication and for 12 months after the last dose. Your care team can help you find the option that works for you. Do not breastfeed while taking this medication and for at least 6 months after the last dose. What side effects may I notice from receiving this medication? Side effects that you should report to your care team as soon as possible: Allergic reactions or angioedema--skin rash, itching or hives, swelling of the face, eyes, lips, tongue, arms, or legs, trouble swallowing or  breathing Bowel blockage--stomach cramping, unable to have a bowel movement or pass gas, loss of appetite, vomiting Dizziness, loss of balance or coordination, confusion or trouble speaking Heart attack--pain or tightness in the chest, shoulders, arms, or jaw, nausea, shortness of breath, cold or clammy skin, feeling faint or lightheaded Heart rhythm changes--fast or irregular heartbeat, dizziness, feeling faint or lightheaded, chest pain, trouble breathing Infection--fever, chills, cough, sore throat, wounds that don't heal, pain or trouble when passing urine, general feeling of discomfort or being unwell Infusion reactions--chest pain, shortness of breath or trouble breathing, feeling faint or lightheaded Kidney injury--decrease in the amount of urine, swelling of the ankles, hands, or feet Liver injury--right upper belly pain, loss of appetite, nausea, light-colored stool, dark yellow or brown urine, yellowing skin or eyes, unusual weakness or fatigue Redness, blistering, peeling, or loosening of the skin, including inside the mouth Stomach pain that is severe, does not go away, or gets worse Tumor lysis syndrome (TLS)--nausea, vomiting, diarrhea, decrease in the amount of urine, dark urine, unusual weakness or fatigue, confusion, muscle pain or cramps, fast or irregular heartbeat, joint pain Side effects that usually do not require medical attention (report to your care team if they continue or are bothersome): Headache Joint pain Nausea Runny or stuffy nose Unusual weakness or fatigue This list may not describe all possible side effects. Call your doctor for medical advice about side effects. You may report side effects to FDA at 1-800-FDA-1088. Where should I keep my medication? This medication is given in a hospital or clinic. It will not be stored at home. NOTE: This sheet is a summary. It may not cover all possible information. If you have questions about this medicine, talk to your  doctor, pharmacist, or health care provider.  2024 Elsevier/Gold Standard (2021-09-05 00:00:00)  Polatuzumab Vedotin  Injection What is this medication? POLATUZUMAB VEDOTIN  (poe la tooz ue mab ve doe tin) treats lymphoma. It works by blocking a protein that causes cancer cells to grow and multiply. This helps to slow or stop the spread of cancer cells. This medicine may be used for other purposes; ask your health care provider or pharmacist if you have questions. COMMON BRAND NAME(S): Polivy  What should I tell my care team before I take this medication? They need to know if you have any of these conditions: Infection, such as chickenpox, cold sores, herpes Liver disease Low blood cell levels, such as white cells, red cells, platelets Tingling of the fingers or toes or other nerve disorder An unusual or allergic reaction to polatuzumab vedotin , other medications, foods, dyes, or preservatives If you or your partner are pregnant or trying to get pregnant Breast-feeding How should I use this medication? This medication is infused into a vein. It is given by your care team in a hospital or clinic setting. Talk to your care team about the use of this medication  in children. Special care may be needed. Overdosage: If you think you have taken too much of this medicine contact a poison control center or emergency room at once. NOTE: This medicine is only for you. Do not share this medicine with others. What if I miss a dose? Keep appointments for follow-up doses. It is important not to miss your dose. Call your care team if you are unable to keep an appointment. What may interact with this medication? Certain antibiotics, such as clarithromycin, telithromycin Certain antivirals for HIV or AIDS Certain medications for fungal infections, such as ketoconazole, itraconazole, posaconazole, voriconazole Certain medications for seizures, such as carbamazepine, phenobarbital, phenytoin This list may not  describe all possible interactions. Give your health care provider a list of all the medicines, herbs, non-prescription drugs, or dietary supplements you use. Also tell them if you smoke, drink alcohol, or use illegal drugs. Some items may interact with your medicine. What should I watch for while using this medication? Your condition will be monitored carefully while you are receiving this medication. This medication may make you feel generally unwell. This is not uncommon as chemotherapy can affect healthy cells as well as cancer cells. Report any side effects. Continue your course of treatment even though you feel ill unless your care team tells you to stop. You may need blood work while taking this medication. This medication may increase your risk of getting an infection. Call your care team for advice if you get a fever, chills, sore throat, or other symptoms of a cold or flu. Do not treat yourself. Try to avoid being around people who are sick. This medication may increase your risk to bruise or bleed. Call your care team if you notice any unusual bleeding. In some patients, this medication may cause a serious brain infection that may cause death. If you have any problems seeing, thinking, speaking, walking, or standing, tell your care team right away. If you cannot reach your care team, urgently seek other source of medical care. Talk to your care team if you wish to become pregnant or think you might be pregnant. This medication can cause serious birth defects if taken during pregnancy or for 3 months after the last dose.  A negative pregnancy test is required before starting this medication. A reliable form of contraception is recommended while taking this medication and for 3 months after the last dose. Talk to your care team about effective forms of contraception. Do not father a child while taking this medication or for 5 months after the last dose. Use a condom while having sex during this time  period. Do not breastfeed while taking this medication and for 2 months after the last dose. This medication may cause infertility. Talk to your care team if you are concerned about your fertility. What side effects may I notice from receiving this medication? Side effects that you should report to your care team as soon as possible: Allergic reactions--skin rash, itching, hives, swelling of the face, lips, tongue, or throat Dizziness, loss of balance or coordination, confusion or trouble speaking Infection--fever, chills, cough, sore throat, wounds that don't heal, pain or trouble when passing urine, general feeling of discomfort or being unwell Infusion reactions--chest pain, shortness of breath or trouble breathing, feeling faint or lightheaded Liver injury--right upper belly pain, loss of appetite, nausea, light-colored stool, dark yellow or brown urine, yellowing skin or eyes, unusual weakness or fatigue Low red blood cell level--unusual weakness or fatigue, dizziness, headache, trouble breathing Pain,  tingling, or numbness in the hands or feet Stomach pain, unusual weakness or fatigue, nausea, vomiting, diarrhea, or fever that lasts longer than expected Tumor lysis syndrome (TLS)--nausea, vomiting, diarrhea, decrease in the amount of urine, dark urine, unusual weakness or fatigue, confusion, muscle pain or cramps, fast or irregular heartbeat, joint pain Unusual bruising or bleeding Side effects that usually do not require medical attention (report these to your care team if they continue or are bothersome): Diarrhea Dizziness Loss of appetite Vomiting Weight loss This list may not describe all possible side effects. Call your doctor for medical advice about side effects. You may report side effects to FDA at 1-800-FDA-1088. Where should I keep my medication? This medication is given in a hospital or clinic. It will not be stored at home. NOTE: This sheet is a summary. It may not cover  all possible information. If you have questions about this medicine, talk to your doctor, pharmacist, or health care provider.  2024 Elsevier/Gold Standard (2021-08-21 00:00:00)  Doxorubicin  Injection What is this medication? DOXORUBICIN  (dox oh ROO bi sin) treats some types of cancer. It works by slowing down the growth of cancer cells. This medicine may be used for other purposes; ask your health care provider or pharmacist if you have questions. COMMON BRAND NAME(S): Adriamycin , Adriamycin  PFS, Adriamycin  RDF, Rubex  What should I tell my care team before I take this medication? They need to know if you have any of these conditions: Heart disease History of low blood cell levels caused by a medication Liver disease Recent or ongoing radiation An unusual or allergic reaction to doxorubicin , other medications, foods, dyes, or preservatives If you or your partner are pregnant or trying to get pregnant Breast-feeding How should I use this medication? This medication is injected into a vein. It is given by your care team in a hospital or clinic setting. Talk to your care team about the use of this medication in children. Special care may be needed. Overdosage: If you think you have taken too much of this medicine contact a poison control center or emergency room at once. NOTE: This medicine is only for you. Do not share this medicine with others. What if I miss a dose? Keep appointments for follow-up doses. It is important not to miss your dose. Call your care team if you are unable to keep an appointment. What may interact with this medication? 6-mercaptopurine Paclitaxel Phenytoin St. John's wort Trastuzumab Verapamil This list may not describe all possible interactions. Give your health care provider a list of all the medicines, herbs, non-prescription drugs, or dietary supplements you use. Also tell them if you smoke, drink alcohol, or use illegal drugs. Some items may interact with  your medicine. What should I watch for while using this medication? Your condition will be monitored carefully while you are receiving this medication. You may need blood work while taking this medication. This medication may make you feel generally unwell. This is not uncommon as chemotherapy can affect healthy cells as well as cancer cells. Report any side effects. Continue your course of treatment even though you feel ill unless your care team tells you to stop. There is a maximum amount of this medication you should receive throughout your life. The amount depends on the medical condition being treated and your overall health. Your care team will watch how much of this medication you receive. Tell your care team if you have taken this medication before. Your urine may turn red for a few days  after your dose. This is not blood. If your urine is dark or brown, call your care team. In some cases, you may be given additional medications to help with side effects. Follow all directions for their use. This medication may increase your risk of getting an infection. Call your care team for advice if you get a fever, chills, sore throat, or other symptoms of a cold or flu. Do not treat yourself. Try to avoid being around people who are sick. This medication may increase your risk to bruise or bleed. Call your care team if you notice any unusual bleeding. Talk to your care team about your risk of cancer. You may be more at risk for certain types of cancers if you take this medication. Talk to your care team if you or your partner may be pregnant. Serious birth defects can occur if you take this medication during pregnancy and for 6 months after the last dose. Contraception is recommended while taking this medication and for 6 months after the last dose. Your care team can help you find the option that works for you. If your partner can get pregnant, use a condom while taking this medication and for 6 months  after the last dose. Do not breastfeed while taking this medication. This medication may cause infertility. Talk to your care team if you are concerned about your fertility. What side effects may I notice from receiving this medication? Side effects that you should report to your care team as soon as possible: Allergic reactions--skin rash, itching, hives, swelling of the face, lips, tongue, or throat Heart failure--shortness of breath, swelling of the ankles, feet, or hands, sudden weight gain, unusual weakness or fatigue Heart rhythm changes--fast or irregular heartbeat, dizziness, feeling faint or lightheaded, chest pain, trouble breathing Infection--fever, chills, cough, sore throat, wounds that don't heal, pain or trouble when passing urine, general feeling of discomfort or being unwell Low red blood cell level--unusual weakness or fatigue, dizziness, headache, trouble breathing Painful swelling, warmth, or redness of the skin, blisters or sores at the infusion site Unusual bruising or bleeding Side effects that usually do not require medical attention (report to your care team if they continue or are bothersome): Diarrhea Hair loss Nausea Pain, redness, or swelling with sores inside the mouth or throat Red urine This list may not describe all possible side effects. Call your doctor for medical advice about side effects. You may report side effects to FDA at 1-800-FDA-1088. Where should I keep my medication? This medication is given in a hospital or clinic. It will not be stored at home. NOTE: This sheet is a summary. It may not cover all possible information. If you have questions about this medicine, talk to your doctor, pharmacist, or health care provider.  2024 Elsevier/Gold Standard (2022-07-17 00:00:00)  Cyclophosphamide  Injection What is this medication? CYCLOPHOSPHAMIDE  (sye kloe FOSS fa mide) treats some types of cancer. It works by slowing down the growth of cancer  cells. This medicine may be used for other purposes; ask your health care provider or pharmacist if you have questions. COMMON BRAND NAME(S): Cyclophosphamide , Cytoxan , Neosar  What should I tell my care team before I take this medication? They need to know if you have any of these conditions: Heart disease Irregular heartbeat or rhythm Infection Kidney problems Liver disease Low blood cell levels (white cells, platelets, or red blood cells) Lung disease Previous radiation Trouble passing urine An unusual or allergic reaction to cyclophosphamide , other medications, foods, dyes, or preservatives Pregnant  or trying to get pregnant Breast-feeding How should I use this medication? This medication is injected into a vein. It is given by your care team in a hospital or clinic setting. Talk to your care team about the use of this medication in children. Special care may be needed. Overdosage: If you think you have taken too much of this medicine contact a poison control center or emergency room at once. NOTE: This medicine is only for you. Do not share this medicine with others. What if I miss a dose? Keep appointments for follow-up doses. It is important not to miss your dose. Call your care team if you are unable to keep an appointment. What may interact with this medication? Amphotericin B Amiodarone Azathioprine Certain antivirals for HIV or hepatitis Certain medications for blood pressure, such as enalapril, lisinopril, quinapril Cyclosporine Diuretics Etanercept Indomethacin Medications that relax muscles Metronidazole Natalizumab Tamoxifen Warfarin This list may not describe all possible interactions. Give your health care provider a list of all the medicines, herbs, non-prescription drugs, or dietary supplements you use. Also tell them if you smoke, drink alcohol, or use illegal drugs. Some items may interact with your medicine. What should I watch for while using this  medication? This medication may make you feel generally unwell. This is not uncommon as chemotherapy can affect healthy cells as well as cancer cells. Report any side effects. Continue your course of treatment even though you feel ill unless your care team tells you to stop. You may need blood work while you are taking this medication. This medication may increase your risk of getting an infection. Call your care team for advice if you get a fever, chills, sore throat, or other symptoms of a cold or flu. Do not treat yourself. Try to avoid being around people who are sick. Avoid taking medications that contain aspirin , acetaminophen , ibuprofen, naproxen, or ketoprofen unless instructed by your care team. These medications may hide a fever. Be careful brushing or flossing your teeth or using a toothpick because you may get an infection or bleed more easily. If you have any dental work done, tell your dentist you are receiving this medication. Drink water or other fluids as directed. Urinate often, even at night. Some products may contain alcohol. Ask your care team if this medication contains alcohol. Be sure to tell all care teams you are taking this medicine. Certain medicines, like metronidazole and disulfiram, can cause an unpleasant reaction when taken with alcohol. The reaction includes flushing, headache, nausea, vomiting, sweating, and increased thirst. The reaction can last from 30 minutes to several hours. Talk to your care team if you wish to become pregnant or think you might be pregnant. This medication can cause serious birth defects if taken during pregnancy and for 1 year after the last dose. A negative pregnancy test is required before starting this medication. A reliable form of contraception is recommended while taking this medication and for 1 year after the last dose. Talk to your care team about reliable forms of contraception. Do not father a child while taking this medication and for 4  months after the last dose. Use a condom during this time period. Do not breast-feed while taking this medication or for 1 week after the last dose. This medication may cause infertility. Talk to your care team if you are concerned about your fertility. Talk to your care team about your risk of cancer. You may be more at risk for certain types of cancer if you  take this medication. What side effects may I notice from receiving this medication? Side effects that you should report to your care team as soon as possible: Allergic reactions--skin rash, itching, hives, swelling of the face, lips, tongue, or throat Dry cough, shortness of breath or trouble breathing Heart failure--shortness of breath, swelling of the ankles, feet, or hands, sudden weight gain, unusual weakness or fatigue Heart muscle inflammation--unusual weakness or fatigue, shortness of breath, chest pain, fast or irregular heartbeat, dizziness, swelling of the ankles, feet, or hands Heart rhythm changes--fast or irregular heartbeat, dizziness, feeling faint or lightheaded, chest pain, trouble breathing Infection--fever, chills, cough, sore throat, wounds that don't heal, pain or trouble when passing urine, general feeling of discomfort or being unwell Kidney injury--decrease in the amount of urine, swelling of the ankles, hands, or feet Liver injury--right upper belly pain, loss of appetite, nausea, light-colored stool, dark yellow or brown urine, yellowing skin or eyes, unusual weakness or fatigue Low red blood cell level--unusual weakness or fatigue, dizziness, headache, trouble breathing Low sodium level--muscle weakness, fatigue, dizziness, headache, confusion Red or dark brown urine Unusual bruising or bleeding Side effects that usually do not require medical attention (report to your care team if they continue or are bothersome): Hair loss Irregular menstrual cycles or spotting Loss of appetite Nausea Pain, redness, or  swelling with sores inside the mouth or throat Vomiting This list may not describe all possible side effects. Call your doctor for medical advice about side effects. You may report side effects to FDA at 1-800-FDA-1088. Where should I keep my medication? This medication is given in a hospital or clinic. It will not be stored at home. NOTE: This sheet is a summary. It may not cover all possible information. If you have questions about this medicine, talk to your doctor, pharmacist, or health care provider.  2024 Elsevier/Gold Standard (2021-08-30 00:00:00)

## 2024-03-17 NOTE — Progress Notes (Signed)
 Improving fatigue. Occassional dry cough. Dyspnea with exertion. Had bronchoscopy.yesterday. Denies any pain. Bowels normal at present. Appetite is returning to normal post hospitalization.Pt wants refills on 2 cough meds prescribed by other physicians. Pt and wife would like to know more about intrathecal methotrexate .

## 2024-03-17 NOTE — Progress Notes (Signed)
 Patient for DG Methotrexate LP Inj on Friday 03/18/24, I called and spoke with the patient on the phone and gave pre-procedure instructions. Pt was made aware to be here at 9:30a and check in at the H&V entrance. Pt stated understanding.  Called 03/17/24

## 2024-03-17 NOTE — Progress Notes (Signed)
 Nutrition Follow-up:  Patient with diffuse large b-cell lymphoma.  Receiving POLA-R-CHP.  Noted hospital admission 10/31-11/3 for neutropenic fever, cough, weakness.   Met with patient and wife during infusion.  Reports that over the past week appetite has improved.  Has been eating chicken pie, fruit, sweets.  Taste is better.  Eating graham crackers during visit.      Medications: reviewed  Labs: reviewed  Anthropometrics:   Weight 179 lb today  181 lb 3.2 oz on 10/23 188 lb 1.6 on 10/2 189 lb 8 oz on 9/11 190 lb 14.7 oz on 8/14 219 lb 9.6 oz on 10/08/23   NUTRITION DIAGNOSIS: Inadequate oral intake improving    INTERVENTION:  Continue with small frequent meals especially week following treatment Encouraged antiemetic to decreased symptoms     MONITORING, EVALUATION, GOAL: weight trends, intake   NEXT VISIT: as needed  Ivery Nanney B. Dasie SOLON, CSO, LDN Registered Dietitian [phone]

## 2024-03-18 ENCOUNTER — Inpatient Hospital Stay

## 2024-03-18 ENCOUNTER — Other Ambulatory Visit: Payer: Self-pay | Admitting: *Deleted

## 2024-03-18 ENCOUNTER — Ambulatory Visit
Admission: RE | Admit: 2024-03-18 | Discharge: 2024-03-18 | Disposition: A | Discharge status: Home / Self Care | Source: Ambulatory Visit | Attending: Internal Medicine | Admitting: Internal Medicine

## 2024-03-18 VITALS — BP 159/85 | HR 76 | Temp 97.1°F | Resp 16

## 2024-03-18 DIAGNOSIS — C8333 Diffuse large B-cell lymphoma, intra-abdominal lymph nodes: Secondary | ICD-10-CM

## 2024-03-18 DIAGNOSIS — Z5112 Encounter for antineoplastic immunotherapy: Secondary | ICD-10-CM | POA: Diagnosis not present

## 2024-03-18 LAB — CSF CELL COUNT WITH DIFFERENTIAL
RBC Count, CSF: 8 /mm3 — ABNORMAL HIGH (ref 0–3)
Tube #: 3
WBC, CSF: 0 /mm3 (ref 0–5)

## 2024-03-18 MED ORDER — ALLOPURINOL 300 MG PO TABS: 300.0000 mg | ORAL_TABLET | Freq: Two times a day (BID) | ORAL | 0 refills | Status: AC

## 2024-03-18 MED ORDER — PEGFILGRASTIM-JMDB 6 MG/0.6ML ~~LOC~~ SOSY
6.0000 mg | PREFILLED_SYRINGE | Freq: Once | SUBCUTANEOUS | Status: AC
  Administered 2024-03-18: 6 mg via SUBCUTANEOUS
  Filled 2024-03-18: qty 0.6

## 2024-03-18 NOTE — Progress Notes (Signed)
 Per Dr. Babara  OK to proceed with fulphila  injection today. Patient received IT methotrexate  administered by IR today at 1030.     Maudie FORBES Andreas, PharmD, BCPS Clinical Pharmacist

## 2024-03-21 LAB — COMP PANEL: LEUKEMIA/LYMPHOMA

## 2024-04-05 ENCOUNTER — Encounter: Payer: Self-pay | Admitting: *Deleted

## 2024-04-06 ENCOUNTER — Inpatient Hospital Stay: Admitting: Internal Medicine

## 2024-04-06 ENCOUNTER — Inpatient Hospital Stay: Attending: Internal Medicine

## 2024-04-06 ENCOUNTER — Inpatient Hospital Stay

## 2024-04-06 NOTE — Assessment & Plan Note (Deleted)
#    DLBCL- NON-GCB sub type- STAGE IV- JULY 2025- 12/07/2023- PET scan-   Hypermetabolic right hilar and subcarinal lymphadenopathy consistent with lymphoma. Hypermetabolic disease involving the left iliac bone and adjacent iliopsoas and gluteus musculature; Hypermetabolic disease involving the bony anatomy of the pelvis and proximal femurs bilaterally, proximal left femoral diaphysis, left-sided ribs, lumbar spine, and sacral spine as well as parasacral soft tissues.   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   KI-67 IS ESSENTIALLY 100%. 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   hepatitis panel NEG. AUG 19th, 2025-  MUGA scan- left ventricular ejection fraction equals 53.5 %. 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 # s/p 2 cycles of Chemo- PET scan: sep 23rd, 2025- . Marked, near complete response to therapy of lymphoma. The only residual active disease is centered about the proximal right main stem bronchus, as evidenced by soft tissue thickening and hypermetabolism. dauville score 4.   # proceed with cycle # 5 POLA R-CHP chemotherapy every 3 weeks x 6 cycles. Will odse reduce cytoxan  to 650mg /2; and dox to 40 mg/m2  Labs-CBC/chemistries were reviewed with the patient.  Will repeat a PET scan after cycle #6.  Will also proceed with intrathecal IT prophylaxis starting 11/21.   # CT scan October 2025/chest neutropenic fever- ?  Progression of the right mainstem bronchus narrowing-status post bronchoscopy [Dr.Dgyali]-clinically not suggestive of progressive disease but from scarring- will  follow up with PET after 6 cycles. Nov 2025- cytology-negative for cancer  #  Diarrhea- G-2 sec to chemo- continue prn immoiudm/lomitil prn.   # ID prophylaxis-recommend acyclovir  400 twice daily; Bactrim  Monday Wednesday Friday.  Tumor lysis prophylaxis-allopurinol  300 mg a day- stable.    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 continue Percocet prn.  stable.   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   #Incidental findings on Imaging  PET- CT , 2025:  Similar size of a 1.5 cm left renal lesion which measures greater than fluiddensity. Complex cyst is favored over a solid neoplasm, given lack of significant activity-  pre and post-contrast abdominal MRI- will plan down the line   # IV access: s/p Mediport placement.   PS- PET-costs # DISPOSITION: # chemo today; d2- shot& IT chemo # follow in 3  weeks- MD port-labs- cbc/cmp; LDH;  chemo- d-2 injection-; day D-2 IT chemo with IR-      Dr.B

## 2024-04-07 ENCOUNTER — Inpatient Hospital Stay: Admitting: Internal Medicine

## 2024-04-07 ENCOUNTER — Inpatient Hospital Stay

## 2024-04-07 ENCOUNTER — Inpatient Hospital Stay: Attending: Internal Medicine | Admitting: Internal Medicine

## 2024-04-07 ENCOUNTER — Inpatient Hospital Stay: Attending: Internal Medicine

## 2024-04-07 ENCOUNTER — Encounter: Payer: Self-pay | Admitting: Internal Medicine

## 2024-04-07 VITALS — BP 119/81 | HR 85 | Resp 18

## 2024-04-07 VITALS — BP 91/80 | HR 113 | Temp 97.7°F | Resp 18 | Ht 72.0 in | Wt 178.8 lb

## 2024-04-07 DIAGNOSIS — Z5112 Encounter for antineoplastic immunotherapy: Secondary | ICD-10-CM | POA: Diagnosis present

## 2024-04-07 DIAGNOSIS — C8333 Diffuse large B-cell lymphoma, intra-abdominal lymph nodes: Secondary | ICD-10-CM | POA: Diagnosis not present

## 2024-04-07 DIAGNOSIS — C83398 Diffuse large b-cell lymphoma of other extranodal and solid organ sites: Secondary | ICD-10-CM | POA: Diagnosis present

## 2024-04-07 DIAGNOSIS — Z5189 Encounter for other specified aftercare: Secondary | ICD-10-CM | POA: Diagnosis not present

## 2024-04-07 DIAGNOSIS — Z5111 Encounter for antineoplastic chemotherapy: Secondary | ICD-10-CM | POA: Diagnosis present

## 2024-04-07 LAB — CBC WITH DIFFERENTIAL (CANCER CENTER ONLY)
Abs Immature Granulocytes: 0.38 K/uL — ABNORMAL HIGH (ref 0.00–0.07)
Basophils Absolute: 0.1 K/uL (ref 0.0–0.1)
Basophils Relative: 1 %
Eosinophils Absolute: 0 K/uL (ref 0.0–0.5)
Eosinophils Relative: 0 %
HCT: 33.8 % — ABNORMAL LOW (ref 39.0–52.0)
Hemoglobin: 11 g/dL — ABNORMAL LOW (ref 13.0–17.0)
Immature Granulocytes: 6 %
Lymphocytes Relative: 20 %
Lymphs Abs: 1.2 K/uL (ref 0.7–4.0)
MCH: 32.4 pg (ref 26.0–34.0)
MCHC: 32.5 g/dL (ref 30.0–36.0)
MCV: 99.4 fL (ref 80.0–100.0)
Monocytes Absolute: 0.6 K/uL (ref 0.1–1.0)
Monocytes Relative: 10 %
Neutro Abs: 3.7 K/uL (ref 1.7–7.7)
Neutrophils Relative %: 63 %
Platelet Count: 290 K/uL (ref 150–400)
RBC: 3.4 MIL/uL — ABNORMAL LOW (ref 4.22–5.81)
RDW: 15.4 % (ref 11.5–15.5)
WBC Count: 5.9 K/uL (ref 4.0–10.5)
nRBC: 0 % (ref 0.0–0.2)

## 2024-04-07 LAB — CMP (CANCER CENTER ONLY)
ALT: 22 U/L (ref 0–44)
AST: 30 U/L (ref 15–41)
Albumin: 3.9 g/dL (ref 3.5–5.0)
Alkaline Phosphatase: 53 U/L (ref 38–126)
Anion gap: 11 (ref 5–15)
BUN: 7 mg/dL — ABNORMAL LOW (ref 8–23)
CO2: 25 mmol/L (ref 22–32)
Calcium: 9.1 mg/dL (ref 8.9–10.3)
Chloride: 105 mmol/L (ref 98–111)
Creatinine: 0.84 mg/dL (ref 0.61–1.24)
GFR, Estimated: >60 mL/min (ref >60)
Glucose, Bld: 117 mg/dL — ABNORMAL HIGH (ref 70–99)
Potassium: 4.1 mmol/L (ref 3.5–5.1)
Sodium: 140 mmol/L (ref 135–145)
Total Bilirubin: 0.6 mg/dL (ref 0.0–1.2)
Total Protein: 6.3 g/dL — ABNORMAL LOW (ref 6.5–8.1)

## 2024-04-07 LAB — LACTATE DEHYDROGENASE: LDH: 294 U/L — ABNORMAL HIGH (ref 105–235)

## 2024-04-07 MED ORDER — PALONOSETRON HCL INJECTION 0.25 MG/5ML
0.2500 mg | Freq: Once | INTRAVENOUS | Status: AC
  Administered 2024-04-07: 0.25 mg via INTRAVENOUS
  Filled 2024-04-07: qty 5

## 2024-04-07 MED ORDER — DEXAMETHASONE SOD PHOSPHATE PF 10 MG/ML IJ SOLN
10.0000 mg | Freq: Once | INTRAMUSCULAR | Status: AC
  Administered 2024-04-07: 10 mg via INTRAVENOUS

## 2024-04-07 MED ORDER — ACETAMINOPHEN 325 MG PO TABS
650.0000 mg | ORAL_TABLET | Freq: Once | ORAL | Status: AC
  Administered 2024-04-07: 650 mg via ORAL
  Filled 2024-04-07: qty 2

## 2024-04-07 MED ORDER — DOXORUBICIN HCL CHEMO IV INJECTION 2 MG/ML
40.0000 mg/m2 | Freq: Once | INTRAVENOUS | Status: AC
  Administered 2024-04-07: 86 mg via INTRAVENOUS
  Filled 2024-04-07: qty 43

## 2024-04-07 MED ORDER — SODIUM CHLORIDE 0.9 % IV SOLN
600.0000 mg/m2 | Freq: Once | INTRAVENOUS | Status: AC
  Administered 2024-04-07: 1280 mg via INTRAVENOUS
  Filled 2024-04-07: qty 64

## 2024-04-07 MED ORDER — FAMOTIDINE IN NACL 20-0.9 MG/50ML-% IV SOLN
20.0000 mg | Freq: Once | INTRAVENOUS | Status: AC
  Administered 2024-04-07: 20 mg via INTRAVENOUS
  Filled 2024-04-07: qty 50

## 2024-04-07 MED ORDER — APREPITANT 130 MG/18ML IV EMUL
130.0000 mg | Freq: Once | INTRAVENOUS | Status: AC
  Administered 2024-04-07: 130 mg via INTRAVENOUS
  Filled 2024-04-07: qty 18

## 2024-04-07 MED ORDER — MONTELUKAST SODIUM 10 MG PO TABS
10.0000 mg | ORAL_TABLET | Freq: Once | ORAL | Status: AC
  Administered 2024-04-07: 10 mg via ORAL
  Filled 2024-04-07: qty 1

## 2024-04-07 MED ORDER — SODIUM CHLORIDE 0.9 % IV SOLN
375.0000 mg/m2 | Freq: Once | INTRAVENOUS | Status: AC
  Administered 2024-04-07: 800 mg via INTRAVENOUS
  Filled 2024-04-07: qty 30

## 2024-04-07 MED ORDER — DIPHENHYDRAMINE HCL 25 MG PO TABS
50.0000 mg | ORAL_TABLET | Freq: Once | ORAL | Status: AC
  Administered 2024-04-07: 50 mg via ORAL
  Filled 2024-04-07: qty 2

## 2024-04-07 MED ORDER — SODIUM CHLORIDE (PF) 0.9 % IJ SOLN
Freq: Once | INTRAMUSCULAR | Status: DC
  Filled 2024-04-07 (×2): qty 0.48

## 2024-04-07 MED ORDER — SODIUM CHLORIDE 0.9 % IV SOLN
160.0000 mg | Freq: Once | INTRAVENOUS | Status: AC
  Administered 2024-04-07: 160 mg via INTRAVENOUS
  Filled 2024-04-07: qty 7

## 2024-04-07 NOTE — Patient Instructions (Signed)
 [facility] - A DEPT OF . [facility]  Discharge Instructions: Thank you for choosing [facility] to provide your oncology and hematology care.  If you have a lab appointment with the Cancer Center, please go directly to the Cancer Center and check in at the registration area.  Wear comfortable clothing and clothing appropriate for easy access to any Portacath or PICC line.   We strive to give you quality time with your provider. You may need to reschedule your appointment if you arrive late (15 or more minutes).  Arriving late affects you and other patients whose appointments are after yours.  Also, if you miss three or more appointments without notifying the office, you may be dismissed from the clinic at the providers discretion.      For prescription refill requests, have your pharmacy contact our office and allow 72 hours for refills to be completed.    Today you received the following chemotherapy and/or immunotherapy agents RUXIENCE , POLIVY  CYTOXAN ,  ADRIAMYCIN      To help prevent nausea and vomiting after your treatment, we encourage you to take your nausea medication as directed.  BELOW ARE SYMPTOMS THAT SHOULD BE REPORTED IMMEDIATELY: *FEVER GREATER THAN 100.4 F (38 C) OR HIGHER *CHILLS OR SWEATING *NAUSEA AND VOMITING THAT IS NOT CONTROLLED WITH YOUR NAUSEA MEDICATION *UNUSUAL SHORTNESS OF BREATH *UNUSUAL BRUISING OR BLEEDING *URINARY PROBLEMS (pain or burning when urinating, or frequent urination) *BOWEL PROBLEMS (unusual diarrhea, constipation, pain near the anus) TENDERNESS IN MOUTH AND THROAT WITH OR WITHOUT PRESENCE OF ULCERS (sore throat, sores in mouth, or a toothache) UNUSUAL RASH, SWELLING OR PAIN  UNUSUAL VAGINAL DISCHARGE OR ITCHING   Items with * indicate a potential emergency and should be followed up as soon as possible or go to the Emergency Department if any problems should occur.  Please show the CHEMOTHERAPY  ALERT CARD or IMMUNOTHERAPY ALERT CARD at check-in to the Emergency Department and triage nurse.  Should you have questions after your visit or need to cancel or reschedule your appointment, please contact [facility] - A DEPT OF [facility]  [phone] and follow the prompts.  Office hours are 8:00 a.m. to 4:30 p.m. Monday - Friday. Please note that voicemails left after 4:00 p.m. may not be returned until the following business day.  We are closed weekends and major holidays. You have access to a nurse at all times for urgent questions. Please call the main number to the clinic [phone] and follow the prompts.  For any non-urgent questions, you may also contact your provider using MyChart. We now offer e-Visits for anyone 57 and older to request care online for non-urgent symptoms. For details visit [email or web address].  Cyclophosphamide  Injection What is this medication? CYCLOPHOSPHAMIDE  (sye kloe FOSS fa mide) treats some types of cancer. It works by slowing down the growth of cancer cells. This medicine may be used for other purposes; ask your health care provider or pharmacist if you have questions. COMMON BRAND NAME(S): Cyclophosphamide , Cytoxan , Neosar  What should I tell my care team before I take this medication? They need to know if you have any of these conditions: Heart disease Irregular heartbeat or rhythm Infection Kidney problems Liver disease Low blood cell levels (white cells, platelets, or red blood cells) Lung disease Previous radiation Trouble passing urine An unusual or allergic reaction to cyclophosphamide , other medications, foods, dyes, or preservatives Pregnant or trying to get pregnant Breast-feeding  How should I use this medication? This medication is injected into a vein. It is given by your care team in a hospital or clinic setting. Talk to your care team about the use of this medication in children. Special care may  be needed. Overdosage: If you think you have taken too much of this medicine contact a poison control center or emergency room at once. NOTE: This medicine is only for you. Do not share this medicine with others. What if I miss a dose? Keep appointments for follow-up doses. It is important not to miss your dose. Call your care team if you are unable to keep an appointment. What may interact with this medication? Amphotericin B Amiodarone Azathioprine Certain antivirals for HIV or hepatitis Certain medications for blood pressure, such as enalapril, lisinopril , quinapril Cyclosporine Diuretics Etanercept Indomethacin Medications that relax muscles Metronidazole  Natalizumab Tamoxifen Warfarin This list may not describe all possible interactions. Give your health care provider a list of all the medicines, herbs, non-prescription drugs, or dietary supplements you use. Also tell them if you smoke, drink alcohol, or use illegal drugs. Some items may interact with your medicine. What should I watch for while using this medication? This medication may make you feel generally unwell. This is not uncommon as chemotherapy can affect healthy cells as well as cancer cells. Report any side effects. Continue your course of treatment even though you feel ill unless your care team tells you to stop. You may need blood work while you are taking this medication. This medication may increase your risk of getting an infection. Call your care team for advice if you get a fever, chills, sore throat, or other symptoms of a cold or flu. Do not treat yourself. Try to avoid being around people who are sick. Avoid taking medications that contain aspirin, acetaminophen , ibuprofen , naproxen, or ketoprofen unless instructed by your care team. These medications may hide a fever. Be careful brushing or flossing your teeth or using a toothpick because you may get an infection or bleed more easily. If you have any dental work  done, tell your dentist you are receiving this medication. Drink water or other fluids as directed. Urinate often, even at night. Some products may contain alcohol. Ask your care team if this medication contains alcohol. Be sure to tell all care teams you are taking this medicine. Certain medicines, like metronidazole  and disulfiram, can cause an unpleasant reaction when taken with alcohol. The reaction includes flushing, headache, nausea, vomiting, sweating, and increased thirst. The reaction can last from 30 minutes to several hours. Talk to your care team if you wish to become pregnant or think you might be pregnant. This medication can cause serious birth defects if taken during pregnancy and for 1 year after the last dose. A negative pregnancy test is required before starting this medication. A reliable form of contraception is recommended while taking this medication and for 1 year after the last dose. Talk to your care team about reliable forms of contraception. Do not father a child while taking this medication and for 4 months after the last dose. Use a condom during this time period. Do not breast-feed while taking this medication or for 1 week after the last dose. This medication may cause infertility. Talk to your care team if you are concerned about your fertility. Talk to your care team about your risk of cancer. You may be more at risk for certain types of cancer if you take this medication. What side effects  may I notice from receiving this medication? Side effects that you should report to your care team as soon as possible: Allergic reactions--skin rash, itching, hives, swelling of the face, lips, tongue, or throat Dry cough, shortness of breath or trouble breathing Heart failure--shortness of breath, swelling of the ankles, feet, or hands, sudden weight gain, unusual weakness or fatigue Heart muscle inflammation--unusual weakness or fatigue, shortness of breath, chest pain, fast or  irregular heartbeat, dizziness, swelling of the ankles, feet, or hands Heart rhythm changes--fast or irregular heartbeat, dizziness, feeling faint or lightheaded, chest pain, trouble breathing Infection--fever, chills, cough, sore throat, wounds that don't heal, pain or trouble when passing urine, general feeling of discomfort or being unwell Kidney injury--decrease in the amount of urine, swelling of the ankles, hands, or feet Liver injury--right upper belly pain, loss of appetite, nausea, light-colored stool, dark yellow or brown urine, yellowing skin or eyes, unusual weakness or fatigue Low red blood cell level--unusual weakness or fatigue, dizziness, headache, trouble breathing Low sodium level--muscle weakness, fatigue, dizziness, headache, confusion Red or dark brown urine Unusual bruising or bleeding Side effects that usually do not require medical attention (report to your care team if they continue or are bothersome): Hair loss Irregular menstrual cycles or spotting Loss of appetite Nausea Pain, redness, or swelling with sores inside the mouth or throat Vomiting This list may not describe all possible side effects. Call your doctor for medical advice about side effects. You may report side effects to FDA at 1-800-FDA-1088. Where should I keep my medication? This medication is given in a hospital or clinic. It will not be stored at home. NOTE: This sheet is a summary. It may not cover all possible information. If you have questions about this medicine, talk to your doctor, pharmacist, or health care provider.  2024 Elsevier/Gold Standard (2021-08-30 00:00:00)   Also download the MyChart app! Go to the app store, search MyChart, open the app, select [facility], and log in with your MyChart username and password.  Polatuzumab Vedotin  Injection What is this medication? POLATUZUMAB VEDOTIN  (poe la tooz ue mab ve doe tin) treats lymphoma. It works by blocking a protein that causes  cancer cells to grow and multiply. This helps to slow or stop the spread of cancer cells. This medicine may be used for other purposes; ask your health care provider or pharmacist if you have questions. COMMON BRAND NAME(S): Polivy  What should I tell my care team before I take this medication? They need to know if you have any of these conditions: Infection, such as chickenpox, cold sores, herpes Liver disease Low blood cell levels, such as white cells, red cells, platelets Tingling of the fingers or toes or other nerve disorder An unusual or allergic reaction to polatuzumab vedotin , other medications, foods, dyes, or preservatives If you or your partner are pregnant or trying to get pregnant Breast-feeding How should I use this medication? This medication is infused into a vein. It is given by your care team in a hospital or clinic setting. Talk to your care team about the use of this medication in children. Special care may be needed. Overdosage: If you think you have taken too much of this medicine contact a poison control center or emergency room at once. NOTE: This medicine is only for you. Do not share this medicine with others. What if I miss a dose? Keep appointments for follow-up doses. It is important not to miss your dose. Call your care team if you are unable  to keep an appointment. What may interact with this medication? Certain antibiotics, such as clarithromycin, telithromycin Certain antivirals for HIV or AIDS Certain medications for fungal infections, such as ketoconazole, itraconazole, posaconazole, voriconazole Certain medications for seizures, such as carbamazepine, phenobarbital, phenytoin This list may not describe all possible interactions. Give your health care provider a list of all the medicines, herbs, non-prescription drugs, or dietary supplements you use. Also tell them if you smoke, drink alcohol, or use illegal drugs. Some items may interact with your  medicine. What should I watch for while using this medication? Your condition will be monitored carefully while you are receiving this medication. This medication may make you feel generally unwell. This is not uncommon as chemotherapy can affect healthy cells as well as cancer cells. Report any side effects. Continue your course of treatment even though you feel ill unless your care team tells you to stop. You may need blood work while taking this medication. This medication may increase your risk of getting an infection. Call your care team for advice if you get a fever, chills, sore throat, or other symptoms of a cold or flu. Do not treat yourself. Try to avoid being around people who are sick. This medication may increase your risk to bruise or bleed. Call your care team if you notice any unusual bleeding. In some patients, this medication may cause a serious brain infection that may cause death. If you have any problems seeing, thinking, speaking, walking, or standing, tell your care team right away. If you cannot reach your care team, urgently seek other source of medical care. Talk to your care team if you wish to become pregnant or think you might be pregnant. This medication can cause serious birth defects if taken during pregnancy or for 3 months after the last dose.  A negative pregnancy test is required before starting this medication. A reliable form of contraception is recommended while taking this medication and for 3 months after the last dose. Talk to your care team about effective forms of contraception. Do not father a child while taking this medication or for 5 months after the last dose. Use a condom while having sex during this time period. Do not breastfeed while taking this medication and for 2 months after the last dose. This medication may cause infertility. Talk to your care team if you are concerned about your fertility. What side effects may I notice from receiving this  medication? Side effects that you should report to your care team as soon as possible: Allergic reactions--skin rash, itching, hives, swelling of the face, lips, tongue, or throat Dizziness, loss of balance or coordination, confusion or trouble speaking Infection--fever, chills, cough, sore throat, wounds that don't heal, pain or trouble when passing urine, general feeling of discomfort or being unwell Infusion reactions--chest pain, shortness of breath or trouble breathing, feeling faint or lightheaded Liver injury--right upper belly pain, loss of appetite, nausea, light-colored stool, dark yellow or brown urine, yellowing skin or eyes, unusual weakness or fatigue Low red blood cell level--unusual weakness or fatigue, dizziness, headache, trouble breathing Pain, tingling, or numbness in the hands or feet Stomach pain, unusual weakness or fatigue, nausea, vomiting, diarrhea, or fever that lasts longer than expected Tumor lysis syndrome (TLS)--nausea, vomiting, diarrhea, decrease in the amount of urine, dark urine, unusual weakness or fatigue, confusion, muscle pain or cramps, fast or irregular heartbeat, joint pain Unusual bruising or bleeding Side effects that usually do not require medical attention (report these to your  care team if they continue or are bothersome): Diarrhea Dizziness Loss of appetite Vomiting Weight loss This list may not describe all possible side effects. Call your doctor for medical advice about side effects. You may report side effects to FDA at 1-800-FDA-1088. Where should I keep my medication? This medication is given in a hospital or clinic. It will not be stored at home. NOTE: This sheet is a summary. It may not cover all possible information. If you have questions about this medicine, talk to your doctor, pharmacist, or health care provider.  2024 Elsevier/Gold Standard (2021-08-21 00:00:00)  Doxorubicin  Injection What is this medication? DOXORUBICIN  (dox oh  ROO bi sin) treats some types of cancer. It works by slowing down the growth of cancer cells. This medicine may be used for other purposes; ask your health care provider or pharmacist if you have questions. COMMON BRAND NAME(S): Adriamycin , Adriamycin  PFS, Adriamycin  RDF, Rubex  What should I tell my care team before I take this medication? They need to know if you have any of these conditions: Heart disease History of low blood cell levels caused by a medication Liver disease Recent or ongoing radiation An unusual or allergic reaction to doxorubicin , other medications, foods, dyes, or preservatives If you or your partner are pregnant or trying to get pregnant Breast-feeding How should I use this medication? This medication is injected into a vein. It is given by your care team in a hospital or clinic setting. Talk to your care team about the use of this medication in children. Special care may be needed. Overdosage: If you think you have taken too much of this medicine contact a poison control center or emergency room at once. NOTE: This medicine is only for you. Do not share this medicine with others. What if I miss a dose? Keep appointments for follow-up doses. It is important not to miss your dose. Call your care team if you are unable to keep an appointment. What may interact with this medication? 6-mercaptopurine Paclitaxel Phenytoin St. John's wort Trastuzumab Verapamil This list may not describe all possible interactions. Give your health care provider a list of all the medicines, herbs, non-prescription drugs, or dietary supplements you use. Also tell them if you smoke, drink alcohol, or use illegal drugs. Some items may interact with your medicine. What should I watch for while using this medication? Your condition will be monitored carefully while you are receiving this medication. You may need blood work while taking this medication. This medication may make you feel generally  unwell. This is not uncommon as chemotherapy can affect healthy cells as well as cancer cells. Report any side effects. Continue your course of treatment even though you feel ill unless your care team tells you to stop. There is a maximum amount of this medication you should receive throughout your life. The amount depends on the medical condition being treated and your overall health. Your care team will watch how much of this medication you receive. Tell your care team if you have taken this medication before. Your urine may turn red for a few days after your dose. This is not blood. If your urine is dark or brown, call your care team. In some cases, you may be given additional medications to help with side effects. Follow all directions for their use. This medication may increase your risk of getting an infection. Call your care team for advice if you get a fever, chills, sore throat, or other symptoms of a cold or flu.  Do not treat yourself. Try to avoid being around people who are sick. This medication may increase your risk to bruise or bleed. Call your care team if you notice any unusual bleeding. Talk to your care team about your risk of cancer. You may be more at risk for certain types of cancers if you take this medication. Talk to your care team if you or your partner may be pregnant. Serious birth defects can occur if you take this medication during pregnancy and for 6 months after the last dose. Contraception is recommended while taking this medication and for 6 months after the last dose. Your care team can help you find the option that works for you. If your partner can get pregnant, use a condom while taking this medication and for 6 months after the last dose. Do not breastfeed while taking this medication. This medication may cause infertility. Talk to your care team if you are concerned about your fertility. What side effects may I notice from receiving this medication? Side effects  that you should report to your care team as soon as possible: Allergic reactions--skin rash, itching, hives, swelling of the face, lips, tongue, or throat Heart failure--shortness of breath, swelling of the ankles, feet, or hands, sudden weight gain, unusual weakness or fatigue Heart rhythm changes--fast or irregular heartbeat, dizziness, feeling faint or lightheaded, chest pain, trouble breathing Infection--fever, chills, cough, sore throat, wounds that don't heal, pain or trouble when passing urine, general feeling of discomfort or being unwell Low red blood cell level--unusual weakness or fatigue, dizziness, headache, trouble breathing Painful swelling, warmth, or redness of the skin, blisters or sores at the infusion site Unusual bruising or bleeding Side effects that usually do not require medical attention (report to your care team if they continue or are bothersome): Diarrhea Hair loss Nausea Pain, redness, or swelling with sores inside the mouth or throat Red urine This list may not describe all possible side effects. Call your doctor for medical advice about side effects. You may report side effects to FDA at 1-800-FDA-1088. Where should I keep my medication? This medication is given in a hospital or clinic. It will not be stored at home. NOTE: This sheet is a summary. It may not cover all possible information. If you have questions about this medicine, talk to your doctor, pharmacist, or health care provider.  2024 Elsevier/Gold Standard (2022-07-17 00:00:00)   Rituximab  Injection What is this medication? RITUXIMAB  (ri TUX i mab) treats leukemia and lymphoma. It works by blocking a protein that causes cancer cells to grow and multiply. This helps to slow or stop the spread of cancer cells. It may also be used to treat autoimmune conditions, such as arthritis. It works by slowing down an overactive immune system. It is a monoclonal antibody. This medicine may be used for other  purposes; ask your health care provider or pharmacist if you have questions. COMMON BRAND NAME(S): RIABNI , Rituxan , RUXIENCE , truxima  What should I tell my care team before I take this medication? They need to know if you have any of these conditions: Chest pain Heart disease Immune system problems Infection, such as chickenpox, cold sores, hepatitis B, herpes Irregular heartbeat or rhythm Kidney disease Low blood counts, such as low white cells, platelets, red cells Lung disease Recent or upcoming vaccine An unusual or allergic reaction to rituximab , other medications, foods, dyes, or preservatives Pregnant or trying to get pregnant Breast-feeding How should I use this medication? This medication is injected into a vein.  It is given by a care team in a hospital or clinic setting. A special MedGuide will be given to you before each treatment. Be sure to read this information carefully each time. Talk to your care team about the use of this medication in children. While this medication may be prescribed for children as young as 6 months for selected conditions, precautions do apply. Overdosage: If you think you have taken too much of this medicine contact a poison control center or emergency room at once. NOTE: This medicine is only for you. Do not share this medicine with others. What if I miss a dose? Keep appointments for follow-up doses. It is important not to miss your dose. Call your care team if you are unable to keep an appointment. What may interact with this medication? Do not take this medication with any of the following: Live vaccines This medication may also interact with the following: Cisplatin This list may not describe all possible interactions. Give your health care provider a list of all the medicines, herbs, non-prescription drugs, or dietary supplements you use. Also tell them if you smoke, drink alcohol, or use illegal drugs. Some items may interact with your  medicine. What should I watch for while using this medication? Your condition will be monitored carefully while you are receiving this medication. You may need blood work while taking this medication. This medication can cause serious infusion reactions. To reduce the risk your care team may give you other medications to take before receiving this one. Be sure to follow the directions from your care team. This medication may increase your risk of getting an infection. Call your care team for advice if you get a fever, chills, sore throat, or other symptoms of a cold or flu. Do not treat yourself. Try to avoid being around people who are sick. Call your care team if you are around anyone with measles, chickenpox, or if you develop sores or blisters that do not heal properly. Avoid taking medications that contain aspirin, acetaminophen , ibuprofen , naproxen, or ketoprofen unless instructed by your care team. These medications may hide a fever. This medication may cause serious skin reactions. They can happen weeks to months after starting the medication. Contact your care team right away if you notice fevers or flu-like symptoms with a rash. The rash may be red or purple and then turn into blisters or peeling of the skin. You may also notice a red rash with swelling of the face, lips, or lymph nodes in your neck or under your arms. In some patients, this medication may cause a serious brain infection that may cause death. If you have any problems seeing, thinking, speaking, walking, or standing, tell your care team right away. If you cannot reach your care team, urgently seek another source of medical care. Talk to your care team if you may be pregnant. Serious birth defects can occur if you take this medication during pregnancy and for 12 months after the last dose. You will need a negative pregnancy test before starting this medication. Contraception is recommended while taking this medication and for 12  months after the last dose. Your care team can help you find the option that works for you. Do not breastfeed while taking this medication and for at least 6 months after the last dose. What side effects may I notice from receiving this medication? Side effects that you should report to your care team as soon as possible: Allergic reactions or angioedema--skin rash, itching or  hives, swelling of the face, eyes, lips, tongue, arms, or legs, trouble swallowing or breathing Bowel blockage--stomach cramping, unable to have a bowel movement or pass gas, loss of appetite, vomiting Dizziness, loss of balance or coordination, confusion or trouble speaking Heart attack--pain or tightness in the chest, shoulders, arms, or jaw, nausea, shortness of breath, cold or clammy skin, feeling faint or lightheaded Heart rhythm changes--fast or irregular heartbeat, dizziness, feeling faint or lightheaded, chest pain, trouble breathing Infection--fever, chills, cough, sore throat, wounds that don't heal, pain or trouble when passing urine, general feeling of discomfort or being unwell Infusion reactions--chest pain, shortness of breath or trouble breathing, feeling faint or lightheaded Kidney injury--decrease in the amount of urine, swelling of the ankles, hands, or feet Liver injury--right upper belly pain, loss of appetite, nausea, light-colored stool, dark yellow or brown urine, yellowing skin or eyes, unusual weakness or fatigue Redness, blistering, peeling, or loosening of the skin, including inside the mouth Stomach pain that is severe, does not go away, or gets worse Tumor lysis syndrome (TLS)--nausea, vomiting, diarrhea, decrease in the amount of urine, dark urine, unusual weakness or fatigue, confusion, muscle pain or cramps, fast or irregular heartbeat, joint pain Side effects that usually do not require medical attention (report to your care team if they continue or are bothersome): Headache Joint  pain Nausea Runny or stuffy nose Unusual weakness or fatigue This list may not describe all possible side effects. Call your doctor for medical advice about side effects. You may report side effects to FDA at 1-800-FDA-1088. Where should I keep my medication? This medication is given in a hospital or clinic. It will not be stored at home. NOTE: This sheet is a summary. It may not cover all possible information. If you have questions about this medicine, talk to your doctor, pharmacist, or health care provider.  2024 [employer] Standard (2021-09-05 00:00:00)

## 2024-04-07 NOTE — Progress Notes (Signed)
 C/o of having some sickness 4-5 days after tx. Losing balance due to weakness.

## 2024-04-07 NOTE — Progress Notes (Signed)
 Patient for DG Lumbar Puncture - Methotrexate  Inj on Friday 04/08/24, I called and spoke with the patient on the phone and gave pre-procedure instructions. Pt was made aware to be here at 9:30a and check in at the H&V entrance. Pt stated understanding. Called 04/07/24

## 2024-04-07 NOTE — Assessment & Plan Note (Addendum)
#    DLBCL- NON-GCB sub type- STAGE IV- JULY 2025- 12/07/2023- PET scan-   Hypermetabolic right hilar and subcarinal lymphadenopathy consistent with lymphoma. Hypermetabolic disease involving the left iliac bone and adjacent iliopsoas and gluteus musculature; Hypermetabolic disease involving the bony anatomy of the pelvis and proximal femurs bilaterally, proximal left femoral diaphysis, left-sided ribs, lumbar spine, and sacral spine as well as parasacral soft tissues.   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   KI-67 IS ESSENTIALLY 100%. 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   hepatitis panel NEG. AUG 19th, 2025-  MUGA scan- left ventricular ejection fraction equals 53.5 %. 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 # s/p 2 cycles of Chemo- PET scan: sep 23rd, 2025- . Marked, near complete response to therapy of lymphoma. The only residual active disease is centered about the proximal right main stem bronchus, as evidenced by soft tissue thickening and hypermetabolism. dauville score 4.   # proceed with cycle # 6 POLA R-CHP chemotherapy every 3 weeks x 6 cycles [ sec to neutropenic fevers reduce cytoxan  to 650mg /2; and dox to 40 mg/m2  Labs-CBC/chemistries were reviewed with the patient.  Will repeat a PET scan after cycle #6- ordered today.  Will also proceed with intrathecal IT prophylaxis  on 12/12 #2.    # cough- CT scan October 2025/chest neutropenic fever -status post bronchoscopy [Dr.Dgyali]-clinically not suggestive of progressive disease but from scarring- will follow up with PET  after 6 cycles. Nov 2025- cytology-negative for cancer- improved-   # Mild Right foot drop- recommend evaluation with Maureen.   # ID prophylaxis-recommend acyclovir  400 twice daily; Bactrim  Monday Wednesday Friday.  Tumor lysis prophylaxis-allopurinol  300 mg a day- stable.    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 continue Percocet prn.  stable.   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   #Incidental findings on Imaging  PET- CT , 2025:  Similar size of a 1.5 cm left renal lesion which measures greater than fluiddensity. Complex cyst is favored over a solid neoplasm, given lack of significant activity-  pre and post-contrast abdominal MRI- will plan down the line   # IV access: s/p Mediport placement.   PS- PET-costs # DISPOSITION: #   evaluation with Maureen re: right foot drop  # chemo today; d2- shot& IT chemo  # follow on dec 5th- MD port-labs- cbc/cmp; LDH; -NO chemo; PET scan prior- day D-2 IT chemo with IR-   Dr.B

## 2024-04-07 NOTE — Progress Notes (Signed)
 Ruben Garcia CONSULT NOTE  Patient Care Team: Myrla Jon HERO, MD as PCP - General (Family Medicine) Verdene Gills, RN as Oncology Nurse Navigator [name], MD as Consulting Physician (Oncology)  CHIEF COMPLAINTS/PURPOSE OF CONSULTATION: DLBCL   Oncology History Overview Note    #  STAGE IV- JULY 2025- CT chest- Right hilar mass/mediastinal adenopathy.  MRI lumbar spine shows-L2 lesion.  MRI of the pelvis- Substantial metastatic burden to the bilateral iliac bones, right ischium, bilateral posterior acetabular walls, left pubic bone, central and right sacrum, bilateral femoral neck, and bilateral intertrochanteric region. In particular, tumor in the proximal femurs  Extraosseous extension of tumor from the dominant left iliac lesion, tracking along the left iliopsoas and upper pelvic sidewall; posteriorly along the gluteus minimus and gluteus medius, and even posteriorly along the lower paraspinal and medial left gluteus maximus musculature; Extraosseous extension of tumor from the right iliac lesion, tracking along the right iliopsoas and right gluteus minimus. Tumor along the attachment site of the right rectus femoris muscle, which could predispose to avulsion. Scattered small pelvic lymph nodes; Mildly prominent median lobe of prostate gland indents the bladder base.  12/07/2023- PET scan-   Hypermetabolic right hilar and subcarinal lymphadenopathy consistent with lymphoma. Hypermetabolic disease involving the left iliac bone and adjacent iliopsoas and gluteus musculature; Hypermetabolic disease involving the bony anatomy of the pelvis and proximal femurs bilaterally, proximal left femoral diaphysis, left-sided ribs, lumbar spine, and sacral spine as well as parasacral soft tissues.  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   KI-67 IS ESSENTIALLY 100%. TO EXCLUDE A SO-CALLED HIGH-GRADE B-CELL  LYMPHOMA/DOUBLE HIT LYMPHOMA FISH IS PENDING.   MUGA SCAN- 8/19-  hepatitis panel_NEG.     # AUG 14th, 2025-  rituximab  today-pending MUGA scan.   # AUG 21st, 2025- cycle #1 of chemotherapy POLA R-CHP    # Elevated AST ALT-question etiology.   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   DLBCL (diffuse large B cell lymphoma) (HCC)  12/01/2023 Initial Diagnosis   DLBCL (diffuse large B cell lymphoma) (HCC)   12/01/2023 Cancer Staging   Staging form: Hodgkin and Non-Hodgkin Lymphoma, AJCC 8th Edition - Clinical: Stage IV (Diffuse large B-cell lymphoma) - Signed by Elishah Ashmore R, MD on 12/01/2023   12/10/2023 -  Chemotherapy   Patient is on Treatment Plan : NON-HODGKINS LYMPHOMA POLA-R-CHP D1 X 6 cycles / Rituximab  D1 q21d X 2 cycles       HISTORY OF PRESENTING ILLNESS: Patient ambulating-in with a cane. accompanied by wife.   Ruben Garcia 78 y.o.  male pleasant patient with a diffuse large B-cell lymphoma stage IV is here to proceed with on [place]- RHP is here for a follow up.  Discussed the use of AI scribe software for clinical note transcription with the patient, who gave verbal consent to proceed.  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  He is nearing completion of six planned cycles of intravenous chemotherapy, which began in August. The most recent cycle was administered at a reduced dose. He experiences mild, transient  post-chemotherapy symptoms, including mild malaise approximately four to five days after treatment, but has not required hospitalization. He denies abnormal bleeding, bruising, or infectious  symptoms. Constipation is present but well controlled with stool softeners. He denies diarrhea. Cough has resolved and is not currently an issue.  He is receiving intrathecal chemotherapy for CNS prophylaxis, with at least one injection completed. He expresses concern about potential side effects from the spinal injections.  He expresses concern regarding the cost of PET imaging. A PET scan in September demonstrated significant improvement. He continues his current medications, including those for shingles, without changes.      Review of Systems  Constitutional:  Positive for malaise/fatigue and weight loss. Negative for chills, diaphoresis and fever.  HENT:  Negative for nosebleeds and sore throat.   Eyes:  Negative for double vision.  Respiratory:  Negative for hemoptysis, sputum production, shortness of breath and wheezing.   Cardiovascular:  Negative for chest pain, palpitations, orthopnea and leg swelling.  Gastrointestinal:  Negative for abdominal pain, blood in stool, constipation, diarrhea, heartburn, melena, nausea and vomiting.  Genitourinary:  Negative for dysuria, frequency and urgency.  Musculoskeletal:  Positive for back pain, joint pain and myalgias.  Skin: Negative.  Negative for itching and rash.  Neurological:  Negative for dizziness, tingling, focal weakness, weakness and headaches.  Endo/Heme/Allergies:  Does not bruise/bleed easily.  Psychiatric/Behavioral:  Negative for depression. The patient is not nervous/anxious and does not have insomnia.     MEDICAL HISTORY:  Past Medical History:  Diagnosis Date   Allergic rhinitis, cause unspecified    Anemia    Basal cell carcinoma of skin, site unspecified 04/28/2010   Nasal; [place].   BPH (benign prostatic hypertrophy)    Chronic cough    Colon polyps    Diffuse large B-cell lymphoma of intra-abdominal lymph nodes (HCC) 2025   Dyspnea    Essential hypertension, benign    Hemorrhoids, internal    Incomplete  bladder emptying    Narrowing of airway    Other abnormal glucose    Over weight    Personal history of colonic polyps    Personal history of other genital system and obstetric disorders(V13.29)    Pneumonia    Pure hypercholesterolemia    Unspecified disorder of kidney and ureter    Unspecified disorder of liver     SURGICAL HISTORY: Past Surgical History:  Procedure Laterality Date   BASAL CELL CARCINOMA EXCISION  2012   Facial        [place]   COLONOSCOPY  11/13/2010   single polyp; repeatin 5 years; [place]   COLONOSCOPY N/A 11/04/2021   Procedure: COLONOSCOPY;  Surgeon: Maryruth Ole DASEN, MD;  Location: [facility] ENDOSCOPY;  Service: Endoscopy;  Laterality: N/A;  REQUEST EARLY TIME   COLONOSCOPY WITH PROPOFOL  N/A 12/13/2015   Procedure: COLONOSCOPY WITH PROPOFOL ;  Surgeon: Lamar DASEN Viktoria, MD;  Location: [facility]COPY;  Service: Endoscopy;  Laterality: N/A;   ear surgery  05/2012   [name].  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  Benign.   HEMORRHOID SURGERY  1978   [name]   IR IMAGING GUIDED PORT INSERTION  12/04/2023   LAPAROSCOPIC CHOLECYSTECTOMY  1982   rotator cuff surgery Right 05/14/2016   VASECTOMY  1978   VIDEO BRONCHOSCOPY WITH ENDOBRONCHIAL ULTRASOUND Bilateral 03/15/2024   Procedure: BRONCHOSCOPY, WITH EBUS;  Surgeon: Isadora Hose, MD;  Location: [facility] ORS;  Service: Pulmonary;  Laterality: Bilateral;    SOCIAL HISTORY: Social History   Socioeconomic History   Marital status: Married  Spouse name: Heron   Number of children: 2   Years of education: 2 years college   Highest education level: Not on file  Occupational History   Occupation: retired    Comment: postal service in 2005  Tobacco Use   Smoking status: Never   Smokeless tobacco: Never  Vaping Use   Vaping status: Never Used  Substance and Sexual Activity   Alcohol use: Not Currently    Alcohol/week: 5.0 standard drinks of alcohol    Types: 5 Cans of beer per week    Comment: weekends beer, 4- 6 total  Cleotilde Mallory   Drug use: No   Sexual activity: Not Currently    Partners: Female  Other Topics Concern   Not on file  Social History Narrative   Always uses seat belts. Smoke alarm and carbon monoxide detector in the home. Guns in the home stored in locked cabinet.Caffeine use: Coffee 2 servings per day, moderate amount. Exercise: Moderate walking 2 - 4 miles daily, 2 - 3 days per week.    Marital status:  Married x 47 years, happily.      Children:  2 children; 1 grandchild (20).      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      Tobacco: never      Alcohol:  Weekends; beer 4-6 per weekend.      Exercise:  Walking every other day 2.5 miles three times per day.      Seatbelt: 100%; no texting      Guns: secured guns.      Living Will: completed in 2014.  FULL CODE but no prolonged measures.        ADLs: independent with ADLs; no assistant devices   Social Drivers of Health   Tobacco Use: Low Risk (04/07/2024)   Patient History    Smoking Tobacco Use: Never    Smokeless Tobacco Use: Never    Passive Exposure: Not on file  Financial Resource Strain: Not on file  Food Insecurity: No Food Insecurity (03/04/2024)   Epic    Worried About Programme Researcher, Broadcasting/film/video in the Last Year: Never true    Ran Out of Food in the Last Year: Never true  Transportation Needs: No Transportation Needs (03/04/2024)   Epic    Lack of Transportation (Medical): No    Lack of Transportation (Non-Medical): No  Physical Activity: Not on file  Stress: Not on file  Social Connections: Moderately Integrated (02/27/2024)   Social Connection and Isolation Panel    Frequency of Communication with Friends and Family: Twice a week    Frequency of Social Gatherings with Friends and Family: Twice a week    Attends Religious Services: Patient declined    Active Member of Clubs or Organizations: No    Attends Banker Meetings: 1 to 4 times per year    Marital Status: Married  Catering Manager  Violence: Not At Risk (03/04/2024)   Epic    Fear of Current or Ex-Partner: No    Emotionally Abused: No    Physically Abused: No    Sexually Abused: No  Depression (PHQ2-9): Low Risk (04/07/2024)   Depression (PHQ2-9)    PHQ-2 Score: 0  Alcohol Screen: Low Risk (09/12/2022)   Alcohol Screen    Last Alcohol Screening Score (AUDIT): 5  Housing: Unknown (03/04/2024)   Epic    Unable to Pay for Housing in the Last Year: No    Number of Times Moved  in the Last Year: Not on file    Homeless in the Last Year: No  Utilities: Not At Risk (03/04/2024)   Epic    Threatened with loss of utilities: No  Health Literacy: Not on file    FAMILY HISTORY: Family History  Problem Relation Age of Onset   Heart disease Mother        first MI in 23s   Diabetes Mother    Hyperlipidemia Mother    Arthritis Mother        rheumatoid   Cancer Father        lung   Diabetes Father    Arthritis Sister    Hyperlipidemia Sister    Hyperlipidemia Sister    Hypertension Sister    Hyperlipidemia Sister    Kidney disease Neg Hx    Prostate cancer Neg Hx    Colon cancer Neg Hx     ALLERGIES:  is allergic to codeine.  MEDICATIONS:  Current Outpatient Medications  Medication Sig Dispense Refill   acetaminophen  (TYLENOL ) 325 MG tablet Take 2 tablets (650 mg total) by mouth every 6 (six) hours as needed for mild pain (pain score 1-3), fever, headache or moderate pain (pain score 4-6) (or Fever >/= 101).     acyclovir  (ZOVIRAX ) 400 MG tablet Take 1 tablet (400 mg total) by mouth 2 (two) times daily. [to prevent shingles] 60 tablet 3   albuterol  (VENTOLIN  HFA) 108 (90 Base) MCG/ACT inhaler Inhale 1-2 puffs into the lungs every 6 (six) hours as needed. 18 g 0   allopurinol  (ZYLOPRIM ) 300 MG tablet Take 1 tablet (300 mg total) by mouth 2 (two) times daily. 120 tablet 0   benzonatate  (TESSALON ) 100 MG capsule Take 1-2 capsules (100-200 mg total) by mouth 3 (three) times daily as needed for cough. 90 capsule 0    chlorpheniramine-HYDROcodone  (TUSSIONEX) 10-8 MG/5ML Take 5 mLs by mouth at bedtime as needed for cough. 140 mL 0   chlorpheniramine-HYDROcodone  (TUSSIONEX) 10-8 MG/5ML Take 5 mLs by mouth at bedtime as needed for cough.     cyclobenzaprine  (FLEXERIL ) 10 MG tablet Take 1 tablet (10 mg total) by mouth 3 (three) times daily as needed for muscle spasms. 30 tablet 2   gabapentin  (NEURONTIN ) 300 MG capsule Take 1 capsule (300 mg total) by mouth at bedtime. 90 capsule 0   HYDROcodone  bit-homatropine (HYDROMET) 5-1.5 MG/5ML syrup Take 5 mLs by mouth every 6 (six) hours as needed for cough. 120 mL 0   lactulose  (CHRONULAC ) 10 GM/15ML solution Take 15 mLs (10 g total) by mouth daily as needed for mild constipation. 236 mL 0   ondansetron  (ZOFRAN ) 4 MG tablet Take 1 tablet (4 mg total) by mouth every 8 (eight) hours as needed for nausea or vomiting. 30 tablet 0   predniSONE  (DELTASONE ) 50 MG tablet Take 2 tablets once day x 5 days. START on day of your chemotherapy. Take in AM; with FOOD. 10 tablet 5   prochlorperazine  (COMPAZINE ) 10 MG tablet Take 1 tablet (10 mg total) by mouth every 6 (six) hours as needed for nausea or vomiting. 30 tablet 0   traMADol  (ULTRAM ) 50 MG tablet Take 1 tablet (50 mg total) by mouth every 6 (six) hours as needed. 30 tablet 0   No current facility-administered medications for this visit.   Facility-Administered Medications Ordered in Other Visits  Medication Dose Route Frequency Provider Last Rate Last Admin   acetaminophen  (TYLENOL ) tablet 650 mg  650 mg Oral Once Clary Meeker R, MD  aprepitant  (CINVANTI ) injection 130 mg  130 mg Intravenous Once Asuncion Tapscott R, MD       cyclophosphamide  (CYTOXAN ) 1,280 mg in sodium chloride  0.9 % 250 mL chemo infusion  600 mg/m2 (Treatment Plan Recorded) Intravenous Once Sula Fetterly R, MD       dexamethasone  (DECADRON ) injection 10 mg  10 mg Intravenous Once Sahil Milner R, MD       diphenhydrAMINE  (BENADRYL )  tablet 50 mg  50 mg Oral Once Jayan Raymundo R, MD       DOXOrubicin  (ADRIAMYCIN ) chemo injection 86 mg  40 mg/m2 (Treatment Plan Recorded) Intravenous Once Marjon Doxtater R, MD       famotidine  (PEPCID ) IVPB 20 mg premix  20 mg Intravenous Once Flora Ratz R, MD       montelukast  (SINGULAIR ) tablet 10 mg  10 mg Oral Once Randilyn Foisy R, MD       palonosetron  (ALOXI ) injection 0.25 mg  0.25 mg Intravenous Once Jovon Streetman R, MD       polatuzumab vedotin -piiq (POLIVY ) 160 mg in sodium chloride  0.9 % 100 mL (1.4815 mg/mL) chemo infusion  160 mg Intravenous Once Pinchos Topel R, MD       riTUXimab -pvvr (RUXIENCE ) 800 mg in sodium chloride  0.9 % 170 mL infusion  375 mg/m2 (Treatment Plan Recorded) Intravenous Once Vicente Weidler R, MD        PHYSICAL EXAMINATION:   Vitals:   04/07/24 0823  BP: 91/80  Pulse: (!) 113  Resp: 18  Temp: 97.7 F (36.5 C)  SpO2: 100%    Filed Weights   04/07/24 0823  Weight: 178 lb 12.8 oz (81.1 kg)    Patient is wheezing bilaterally.  Physical Exam Vitals and nursing note reviewed.  HENT:     Head: Normocephalic and atraumatic.     Mouth/Throat:     Pharynx: Oropharynx is clear.  Eyes:     Extraocular Movements: Extraocular movements intact.     Pupils: Pupils are equal, round, and reactive to light.  Cardiovascular:     Rate and Rhythm: Normal rate and regular rhythm.  Pulmonary:     Comments: Decreased breath sounds bilaterally.  Abdominal:     Palpations: Abdomen is soft.  Musculoskeletal:        General: Normal range of motion.     Cervical back: Normal range of motion.  Skin:    General: Skin is warm.  Neurological:     General: No focal deficit present.     Mental Status: He is alert and oriented to person, place, and time.  Psychiatric:        Behavior: Behavior normal.        Judgment: Judgment normal.     LABORATORY DATA:  I have reviewed the data as listed Lab Results  Component  Value Date   WBC 5.9 04/07/2024   HGB 11.0 (L) 04/07/2024   HCT 33.8 (L) 04/07/2024   MCV 99.4 04/07/2024   PLT 290 04/07/2024   Recent Labs    11/19/23 1347 11/20/23 0511 02/26/24 1352 02/27/24 0456 02/29/24 0747 03/15/24 1006 03/17/24 0824 04/07/24 0814  NA  --    < > 126*   < > 136 138 138 140  K  --    < > 3.9   < > 3.2* 3.9 3.7 4.1  CL  --    < > 93*   < > 100 105 106 105  CO2  --    < > 21*   < >  22  --  24 25  GLUCOSE  --    < > 143*   < > 97 118* 122* 117*  BUN  --    < > 16   < > <5* 7* 12 7*  CREATININE  --    < > 1.09   < > 0.65 0.80 0.81 0.84  CALCIUM   --    < > 7.8*   < > 7.9*  --  8.2* 9.1  GFRNONAA  --    < > >60   < > >60  --  >60 >60  PROT  --    < > 6.7  --   --   --  6.3* 6.3*  ALBUMIN  --    < > 3.3*  --   --   --  3.6 3.9  AST  --    < > 36  --   --   --  25 30  ALT  --    < > 69*  --   --   --  24 22  ALKPHOS  --    < > 66  --   --   --  43 53  BILITOT 2.5*   < > 2.5*  --   --   --  0.9 0.6  BILIDIR 0.3*  --   --   --   --   --   --   --   IBILI 2.2*  --   --   --   --   --   --   --    < > = values in this interval not displayed.    RADIOGRAPHIC STUDIES: I have personally reviewed the radiological images as listed and agreed with the findings in the report. DG FL LUMBAR PUNCTURE W/CHEMO INJECT Result Date: 03/18/2024 CLINICAL DATA:  78 year old male with a history of diffuse large B-cell lymphoma currently undergoing chemotherapy. Request for lumbar puncture with intrathecal methotrexate  injection. EXAM: FLUOROSCOPICALLY GUIDED LUMBAR PUNCTURE FOR INTRATHECAL CHEMOTHERAPY TECHNIQUE: Informed consent was obtained from the patient prior to the procedure, including potential complications of headache, allergy, and pain. A 'time out' was performed. With the patient prone, an appropriate skin entry site was determined fluoroscopically. Operator donned sterile gloves and mask. Skin site was marked, then prepped with Betadine, draped in usual sterile fashion,  and infiltrated locally with 1% lidocaine . A 20 gauge spinal needle was then advanced into the thecal sac at L 4-5 from a left interlaminar approach. Clear colorless CSF spontaneously returned, with opening pressure of 26 cm water. 16 mL CSF were collected and divided among 3 sterile vials for the requested laboratory studies. 5 mL of methotrexate  was then injected into the subarachnoid space. The needle was removed and a bandage was placed over the insertion site. The patient tolerated the procedure well without any immediate complication. FLUOROSCOPY: 5.7 mGy IMPRESSION: Successful lumbar puncture with intrathecal injection of chemotherapy without immediate complication. Procedure performed by: Sherrilee Bal, PA-C and Dr. KANDICE Moan. Electronically Signed   By: Marcey Moan M.D.   On: 03/18/2024 12:42   DG Chest Port 1 View Result Date: 03/15/2024 CLINICAL DATA:  Status post bronchoscopy. EXAM: PORTABLE CHEST 1 VIEW COMPARISON:  02/26/2024. FINDINGS: The heart size and mediastinal contours are unchanged. Aortic atherosclerosis. Stable right chest Port-A-Cath tip overlies the lower SVC. No pneumothorax. No pleural effusion. No focal consolidation. No acute osseous abnormality. IMPRESSION: No acute cardiopulmonary findings. Electronically Signed   By: Harrietta Marcelino HERO.D.  On: 03/15/2024 13:34     DLBCL (diffuse large B cell lymphoma) (HCC) #  DLBCL- NON-GCB sub type- STAGE IV- JULY 2025- 12/07/2023- PET scan-   Hypermetabolic right hilar and subcarinal lymphadenopathy consistent with lymphoma. Hypermetabolic disease involving the left iliac bone and adjacent iliopsoas and gluteus musculature; Hypermetabolic disease involving the bony anatomy of the pelvis and proximal femurs bilaterally, proximal left femoral diaphysis, left-sided ribs, lumbar spine, and sacral spine as well as parasacral soft tissues.   # JULY 2025- Soft tissue, biopsy, left gluteal soft tissue mass 7.5 cm: MORPHOLOGICALLY  CONSISTENT WITH DIFFUSE LARGE B-CELL LYMPHOMA NON-GCB stubtype- - THE CELLS OF   INTEREST ARE B CELLS (CD20/CD45 POSITIVE WHICH COEXPRESS MUM1 AND BCL6, BUT ARE NEGATIVE FOR CD10.   KI-67 IS ESSENTIALLY 100%. 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   hepatitis panel NEG. AUG 19th, 2025-  MUGA scan- left ventricular ejection fraction equals 53.5 %. 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 # s/p 2 cycles of Chemo- PET scan: sep 23rd, 2025- . Marked, near complete response to therapy of lymphoma. The only residual active disease is centered about the proximal right main stem bronchus, as evidenced by soft tissue thickening and hypermetabolism. dauville score 4.   # proceed with cycle # 6 POLA R-CHP chemotherapy every 3 weeks x 6 cycles [ sec to neutropenic fevers reduce cytoxan  to 650mg /2; and dox to 40 mg/m2  Labs-CBC/chemistries were reviewed with the patient.  Will repeat a PET scan after cycle #6- ordered today.  Will also proceed with intrathecal IT prophylaxis  on 12/12 #2.    # cough- CT scan October 2025/chest neutropenic fever -status post bronchoscopy [Dr.Dgyali]-clinically not suggestive of progressive disease but from scarring- will follow up with PET after 6 cycles. Nov 2025- cytology-negative for cancer- improved-   # Mild Right foot drop- recommend evaluation with Maureen.   # ID prophylaxis-recommend acyclovir  400 twice daily; Bactrim  Monday Wednesday Friday.  Tumor lysis prophylaxis-allopurinol  300 mg a day- stable.    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 continue Percocet prn.  stable.   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   #Incidental findings on Imaging  PET- CT , 2025:  Similar size of a 1.5 cm left renal lesion which measures greater than fluiddensity. Complex cyst is favored over a solid neoplasm, given lack of significant activity-  pre and post-contrast abdominal MRI- will plan down the line   # IV access: s/p Mediport placement.   PS- PET-costs # DISPOSITION: #   evaluation with Maureen re: right foot drop  # chemo today; d2- shot& IT chemo  # follow on dec 5th- MD port-labs- cbc/cmp; LDH; -NO chemo; PET scan prior- day D-2 IT chemo with IR-   Dr.B       Cindy JONELLE Joe, MD 04/07/2024 9:17 AM

## 2024-04-08 ENCOUNTER — Inpatient Hospital Stay

## 2024-04-08 ENCOUNTER — Ambulatory Visit
Admission: RE | Admit: 2024-04-08 | Discharge: 2024-04-08 | Disposition: A | Discharge status: Home / Self Care | Source: Ambulatory Visit | Attending: Internal Medicine | Admitting: Internal Medicine

## 2024-04-08 DIAGNOSIS — Z5112 Encounter for antineoplastic immunotherapy: Secondary | ICD-10-CM | POA: Diagnosis not present

## 2024-04-08 DIAGNOSIS — C8333 Diffuse large B-cell lymphoma, intra-abdominal lymph nodes: Secondary | ICD-10-CM

## 2024-04-08 MED ORDER — PEGFILGRASTIM-JMDB 6 MG/0.6ML ~~LOC~~ SOSY
6.0000 mg | PREFILLED_SYRINGE | Freq: Once | SUBCUTANEOUS | Status: AC
  Administered 2024-04-08: 6 mg via SUBCUTANEOUS
  Filled 2024-04-08: qty 0.6

## 2024-04-08 MED ORDER — SODIUM CHLORIDE (PF) 0.9 % IJ SOLN
Freq: Once | INTRAMUSCULAR | Status: AC
  Filled 2024-04-08: qty 0.48

## 2024-04-08 MED ORDER — LIDOCAINE HCL 1 % IJ SOLN
10.0000 mL | Freq: Once | INTRAMUSCULAR | Status: AC
  Administered 2024-04-08: 3 mL

## 2024-04-08 MED ORDER — LIDOCAINE 1 % OPTIME INJ - NO CHARGE
5.0000 mL | Freq: Once | INTRAMUSCULAR | Status: AC
  Administered 2024-04-08: 5 mL
  Filled 2024-04-08: qty 6

## 2024-04-08 NOTE — Procedures (Addendum)
 PROCEDURE SUMMARY:  Successful fluoroscopic guided lumbar puncture with intrathecal methotrexate  infiltration. No immediate complications.  Pt tolerated well.   EBL = none  Please see full dictation in imaging section of Epic for procedure details.

## 2024-04-11 ENCOUNTER — Encounter: Admitting: Family Medicine

## 2024-04-15 ENCOUNTER — Telehealth: Payer: Self-pay | Admitting: *Deleted

## 2024-04-15 ENCOUNTER — Other Ambulatory Visit: Payer: Self-pay

## 2024-04-15 ENCOUNTER — Inpatient Hospital Stay
Admission: EM | Admit: 2024-04-15 | Discharge: 2024-04-17 | DRG: 809 | Disposition: A | Discharge status: Home / Self Care | Attending: Internal Medicine | Admitting: Internal Medicine

## 2024-04-15 ENCOUNTER — Emergency Department

## 2024-04-15 DIAGNOSIS — Z79899 Other long term (current) drug therapy: Secondary | ICD-10-CM | POA: Diagnosis not present

## 2024-04-15 DIAGNOSIS — Z8249 Family history of ischemic heart disease and other diseases of the circulatory system: Secondary | ICD-10-CM

## 2024-04-15 DIAGNOSIS — E871 Hypo-osmolality and hyponatremia: Secondary | ICD-10-CM | POA: Diagnosis present

## 2024-04-15 DIAGNOSIS — Z885 Allergy status to narcotic agent status: Secondary | ICD-10-CM | POA: Diagnosis not present

## 2024-04-15 DIAGNOSIS — D638 Anemia in other chronic diseases classified elsewhere: Secondary | ICD-10-CM | POA: Diagnosis present

## 2024-04-15 DIAGNOSIS — C8330 Diffuse large B-cell lymphoma, unspecified site: Secondary | ICD-10-CM | POA: Diagnosis present

## 2024-04-15 DIAGNOSIS — C83398 Diffuse large b-cell lymphoma of other extranodal and solid organ sites: Secondary | ICD-10-CM | POA: Diagnosis present

## 2024-04-15 DIAGNOSIS — Z83438 Family history of other disorder of lipoprotein metabolism and other lipidemia: Secondary | ICD-10-CM

## 2024-04-15 DIAGNOSIS — K219 Gastro-esophageal reflux disease without esophagitis: Secondary | ICD-10-CM | POA: Diagnosis present

## 2024-04-15 DIAGNOSIS — E872 Acidosis, unspecified: Secondary | ICD-10-CM | POA: Diagnosis present

## 2024-04-15 DIAGNOSIS — I10 Essential (primary) hypertension: Secondary | ICD-10-CM | POA: Diagnosis present

## 2024-04-15 DIAGNOSIS — Z8261 Family history of arthritis: Secondary | ICD-10-CM | POA: Diagnosis not present

## 2024-04-15 DIAGNOSIS — A419 Sepsis, unspecified organism: Principal | ICD-10-CM

## 2024-04-15 DIAGNOSIS — R197 Diarrhea, unspecified: Secondary | ICD-10-CM | POA: Diagnosis present

## 2024-04-15 DIAGNOSIS — E7800 Pure hypercholesterolemia, unspecified: Secondary | ICD-10-CM | POA: Diagnosis present

## 2024-04-15 DIAGNOSIS — R5081 Fever presenting with conditions classified elsewhere: Secondary | ICD-10-CM | POA: Diagnosis present

## 2024-04-15 DIAGNOSIS — T451X5A Adverse effect of antineoplastic and immunosuppressive drugs, initial encounter: Secondary | ICD-10-CM | POA: Diagnosis present

## 2024-04-15 DIAGNOSIS — N400 Enlarged prostate without lower urinary tract symptoms: Secondary | ICD-10-CM | POA: Diagnosis present

## 2024-04-15 DIAGNOSIS — D709 Neutropenia, unspecified: Principal | ICD-10-CM | POA: Diagnosis present

## 2024-04-15 DIAGNOSIS — Z85828 Personal history of other malignant neoplasm of skin: Secondary | ICD-10-CM | POA: Diagnosis not present

## 2024-04-15 DIAGNOSIS — R531 Weakness: Secondary | ICD-10-CM | POA: Diagnosis not present

## 2024-04-15 DIAGNOSIS — Z833 Family history of diabetes mellitus: Secondary | ICD-10-CM | POA: Diagnosis not present

## 2024-04-15 DIAGNOSIS — D61818 Other pancytopenia: Secondary | ICD-10-CM | POA: Diagnosis present

## 2024-04-15 DIAGNOSIS — C8333 Diffuse large B-cell lymphoma, intra-abdominal lymph nodes: Secondary | ICD-10-CM | POA: Diagnosis not present

## 2024-04-15 DIAGNOSIS — Z1152 Encounter for screening for COVID-19: Secondary | ICD-10-CM

## 2024-04-15 DIAGNOSIS — R6510 Systemic inflammatory response syndrome (SIRS) of non-infectious origin without acute organ dysfunction: Secondary | ICD-10-CM | POA: Diagnosis present

## 2024-04-15 LAB — COMPREHENSIVE METABOLIC PANEL WITH GFR
ALT: 19 U/L (ref 0–44)
AST: 18 U/L (ref 15–41)
Albumin: 4.1 g/dL (ref 3.5–5.0)
Alkaline Phosphatase: 58 U/L (ref 38–126)
Anion gap: 13 (ref 5–15)
BUN: 18 mg/dL (ref 8–23)
CO2: 23 mmol/L (ref 22–32)
Calcium: 8.8 mg/dL — ABNORMAL LOW (ref 8.9–10.3)
Chloride: 93 mmol/L — ABNORMAL LOW (ref 98–111)
Creatinine, Ser: 0.9 mg/dL (ref 0.61–1.24)
GFR, Estimated: >60 mL/min
Glucose, Bld: 128 mg/dL — ABNORMAL HIGH (ref 70–99)
Potassium: 3.9 mmol/L (ref 3.5–5.1)
Sodium: 129 mmol/L — ABNORMAL LOW (ref 135–145)
Total Bilirubin: 1.9 mg/dL — ABNORMAL HIGH (ref 0.0–1.2)
Total Protein: 6.9 g/dL (ref 6.5–8.1)

## 2024-04-15 LAB — CBC WITH DIFFERENTIAL/PLATELET
Abs Immature Granulocytes: 0.01 K/uL (ref 0.00–0.07)
Basophils Absolute: 0 K/uL (ref 0.0–0.1)
Basophils Relative: 0 %
Eosinophils Absolute: 0 K/uL (ref 0.0–0.5)
Eosinophils Relative: 0 %
HCT: 31.1 % — ABNORMAL LOW (ref 39.0–52.0)
Hemoglobin: 10.6 g/dL — ABNORMAL LOW (ref 13.0–17.0)
Immature Granulocytes: 3 %
Lymphocytes Relative: 64 %
Lymphs Abs: 0.2 K/uL — ABNORMAL LOW (ref 0.7–4.0)
MCH: 32.8 pg (ref 26.0–34.0)
MCHC: 34.1 g/dL (ref 30.0–36.0)
MCV: 96.3 fL (ref 80.0–100.0)
Monocytes Absolute: 0.1 K/uL (ref 0.1–1.0)
Monocytes Relative: 30 %
Neutro Abs: 0 K/uL — CL (ref 1.7–7.7)
Neutrophils Relative %: 3 %
Platelets: 70 K/uL — ABNORMAL LOW (ref 150–400)
RBC: 3.23 MIL/uL — ABNORMAL LOW (ref 4.22–5.81)
RDW: 14 % (ref 11.5–15.5)
Smear Review: NORMAL
WBC: 0.3 K/uL — CL (ref 4.0–10.5)
nRBC: 0 % (ref 0.0–0.2)

## 2024-04-15 LAB — URINALYSIS, W/ REFLEX TO CULTURE (INFECTION SUSPECTED)
Bacteria, UA: NONE SEEN
Bilirubin Urine: NEGATIVE
Glucose, UA: NEGATIVE mg/dL
Hgb urine dipstick: NEGATIVE
Ketones, ur: NEGATIVE mg/dL
Leukocytes,Ua: NEGATIVE
Nitrite: NEGATIVE
Protein, ur: NEGATIVE mg/dL
Specific Gravity, Urine: 1.024 (ref 1.005–1.030)
Squamous Epithelial / HPF: 0 /HPF (ref 0–5)
pH: 5 (ref 5.0–8.0)

## 2024-04-15 LAB — RESP PANEL BY RT-PCR (RSV, FLU A&B, COVID)  RVPGX2
Influenza A by PCR: NEGATIVE
Influenza B by PCR: NEGATIVE
Resp Syncytial Virus by PCR: NEGATIVE
SARS Coronavirus 2 by RT PCR: NEGATIVE

## 2024-04-15 LAB — LACTIC ACID, PLASMA
Lactic Acid, Venous: 3.1 mmol/L (ref 0.5–1.9)
Lactic Acid, Venous: 2.6 mmol/L (ref 0.5–1.9)

## 2024-04-15 LAB — PROTIME-INR
INR: 1 (ref 0.8–1.2)
Prothrombin Time: 13.5 s (ref 11.4–15.2)

## 2024-04-15 MED ORDER — LACTATED RINGERS IV BOLUS (SEPSIS)
1000.0000 mL | Freq: Once | INTRAVENOUS | Status: AC
  Administered 2024-04-15: 1000 mL via INTRAVENOUS

## 2024-04-15 MED ORDER — SODIUM CHLORIDE 0.9 % IV SOLN
2.0000 g | Freq: Once | INTRAVENOUS | Status: AC
  Administered 2024-04-15: 2 g via INTRAVENOUS
  Filled 2024-04-15: qty 12.5

## 2024-04-15 MED ORDER — METRONIDAZOLE 500 MG/100ML IV SOLN
500.0000 mg | Freq: Once | INTRAVENOUS | Status: AC
  Administered 2024-04-15: 500 mg via INTRAVENOUS
  Filled 2024-04-15: qty 100

## 2024-04-15 MED ORDER — VANCOMYCIN HCL IN DEXTROSE 1-5 GM/200ML-% IV SOLN
1000.0000 mg | Freq: Once | INTRAVENOUS | Status: AC
  Administered 2024-04-15: 1000 mg via INTRAVENOUS
  Filled 2024-04-15: qty 200

## 2024-04-15 NOTE — H&P (Signed)
 " History and Physical    Patient: Ruben Garcia FMW:[id number] DOB: 01-22-1946 DOA: 04/15/2024 DOS: the patient was seen and examined on 04/15/2024 PCP: Myrla Jon HERO, MD  Patient coming from: {Point_of_Origin:26777}  Chief Complaint:  Chief Complaint  Patient presents with   Weakness   HPI: Ruben Garcia is a 78 y.o. male with medical history significant of ***  Review of Systems: {ROS_Text:26778} Past Medical History:  Diagnosis Date   Allergic rhinitis, cause unspecified    Anemia    Basal cell carcinoma of skin, site unspecified 04/28/2010   Nasal; [place].   BPH (benign prostatic hypertrophy)    Chronic cough    Colon polyps    Diffuse large B-cell lymphoma of intra-abdominal lymph nodes (HCC) 2025   Dyspnea    Essential hypertension, benign    Hemorrhoids, internal    Incomplete bladder emptying    Narrowing of airway    Other abnormal glucose    Over weight    Personal history of colonic polyps    Personal history of other genital system and obstetric disorders(V13.29)    Pneumonia    Pure hypercholesterolemia    Unspecified disorder of kidney and ureter    Unspecified disorder of liver    Past Surgical History:  Procedure Laterality Date   BASAL CELL CARCINOMA EXCISION  2012   Facial        [place]   COLONOSCOPY  11/13/2010   single polyp; repeatin 5 years; [place]   COLONOSCOPY N/A 11/04/2021   Procedure: COLONOSCOPY;  Surgeon: Maryruth Ole DASEN, MD;  Location: [facility] ENDOSCOPY;  Service: Endoscopy;  Laterality: N/A;  REQUEST EARLY TIME   COLONOSCOPY WITH PROPOFOL  N/A 12/13/2015   Procedure: COLONOSCOPY WITH PROPOFOL ;  Surgeon: Lamar DASEN Holmes, MD;  Location: [facility] ENDOSCOPY;  Service: Endoscopy;  Laterality: N/A;   ear surgery  05/2012   [name].  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  Benign.   HEMORRHOID SURGERY  1978   [name]   IR IMAGING GUIDED PORT INSERTION  12/04/2023   LAPAROSCOPIC CHOLECYSTECTOMY  1982   rotator cuff surgery Right 05/14/2016    VASECTOMY  1978   VIDEO BRONCHOSCOPY WITH ENDOBRONCHIAL ULTRASOUND Bilateral 03/15/2024   Procedure: BRONCHOSCOPY, WITH EBUS;  Surgeon: Isadora Hose, MD;  Location: [facility] ORS;  Service: Pulmonary;  Laterality: Bilateral;   Social History:  reports that he has never smoked. He has never used smokeless tobacco. He reports that he does not currently use alcohol after a past usage of about 5.0 standard drinks of alcohol per week. He reports that he does not use drugs.  Allergies[1]  Family History  Problem Relation Age of Onset   Heart disease Mother        first MI in 68s   Diabetes Mother    Hyperlipidemia Mother    Arthritis Mother        rheumatoid   Cancer Father        lung   Diabetes Father    Arthritis Sister    Hyperlipidemia Sister    Hyperlipidemia Sister    Hypertension Sister    Hyperlipidemia Sister    Kidney disease Neg Hx    Prostate cancer Neg Hx    Colon cancer Neg Hx     Prior to Admission medications  Medication Sig Start Date End Date Taking? Authorizing Provider  acetaminophen  (TYLENOL ) 325 MG tablet Take 2 tablets (650 mg total) by mouth every 6 (six) hours as needed for mild pain (pain score 1-3), fever, headache or  moderate pain (pain score 4-6) (or Fever >/= 101). 11/21/23   Josette Ade, MD  acyclovir  (ZOVIRAX ) 400 MG tablet Take 1 tablet (400 mg total) by mouth 2 (two) times daily. [to prevent shingles] 12/17/23   Brahmanday, Govinda R, MD  albuterol  (VENTOLIN  HFA) 108 (90 Base) MCG/ACT inhaler Inhale 1-2 puffs into the lungs every 6 (six) hours as needed. 02/29/24   Jhonny Calvin NOVAK, MD  allopurinol  (ZYLOPRIM ) 300 MG tablet Take 1 tablet (300 mg total) by mouth 2 (two) times daily. 03/18/24   Borders, Fonda SAUNDERS, NP  benzonatate  (TESSALON ) 100 MG capsule Take 1-2 capsules (100-200 mg total) by mouth 3 (three) times daily as needed for cough. 02/29/24   Jhonny Calvin NOVAK, MD  chlorpheniramine-HYDROcodone  (TUSSIONEX) 10-8 MG/5ML Take 5 mLs by mouth at  bedtime as needed for cough. 02/29/24   Jhonny Calvin NOVAK, MD  chlorpheniramine-HYDROcodone  (TUSSIONEX) 10-8 MG/5ML Take 5 mLs by mouth at bedtime as needed for cough.    [provider]  cyclobenzaprine  (FLEXERIL ) 10 MG tablet Take 1 tablet (10 mg total) by mouth 3 (three) times daily as needed for muscle spasms. 11/11/23   Gasper Nancyann BRAVO, MD  gabapentin  (NEURONTIN ) 300 MG capsule Take 1 capsule (300 mg total) by mouth at bedtime. 03/17/24   Brahmanday, Govinda R, MD  HYDROcodone  bit-homatropine (HYDROMET) 5-1.5 MG/5ML syrup Take 5 mLs by mouth every 6 (six) hours as needed for cough. 03/17/24   [name], MD  lactulose  (CHRONULAC ) 10 GM/15ML solution Take 15 mLs (10 g total) by mouth daily as needed for mild constipation. 12/22/23   Brahmanday, Govinda R, MD  ondansetron  (ZOFRAN ) 4 MG tablet Take 1 tablet (4 mg total) by mouth every 8 (eight) hours as needed for nausea or vomiting. 11/21/23   Josette Ade, MD  predniSONE  (DELTASONE ) 50 MG tablet Take 2 tablets once day x 5 days. START on day of your chemotherapy. Take in AM; with FOOD. 12/17/23   Brahmanday, Govinda R, MD  prochlorperazine  (COMPAZINE ) 10 MG tablet Take 1 tablet (10 mg total) by mouth every 6 (six) hours as needed for nausea or vomiting. 12/25/23   Brahmanday, Govinda R, MD  traMADol  (ULTRAM ) 50 MG tablet Take 1 tablet (50 mg total) by mouth every 6 (six) hours as needed. 12/31/23   BordersFonda SAUNDERS, NP    Physical Exam: Vitals:   04/15/24 1745 04/15/24 1800 04/15/24 1815 04/15/24 1830  BP:  111/72  133/88  Pulse: (!) 123 (!) 122 (!) 121 (!) 121  Resp: (!) 23 (!) 22 19 15   Temp:      SpO2: 100% 100% 100% 100%  Weight:       *** Data Reviewed: {Tip this will not be part of the note when signed- Document your independent interpretation of telemetry tracing, EKG, lab, Radiology test or any other diagnostic tests. Add any new diagnostic test ordered today. (Optional):26781} {Results:26384}  Assessment  and Plan: No notes have been filed under this hospital service. Service: Hospitalist     Advance Care Planning:   Code Status: Prior ***  Consults: ***  Family Communication: ***  Severity of Illness: {Observation/Inpatient:21159}  Author: Posey Maier, DO 04/15/2024 7:24 PM  For on call review [email or web address].     [1]  Allergies Allergen Reactions   Codeine Nausea Only   "

## 2024-04-15 NOTE — ED Triage Notes (Signed)
 Pt comes in from home via [facility] with complaints of weakness. Pt has lung and bone cancer and gets chemo. Pt had his last treatment last Thursday and Friday. Pt usually feels pretty bad a week after treatments. Pt also endorsed to EMS that he hasn't eaten in about 3 days. Pt denies pain, nausea, vomiting or diarrhea. Pt is alert and oriented x3, MD Paduchowski at bedside, and to place orders.

## 2024-04-15 NOTE — Consult Note (Signed)
 CODE SEPSIS - PHARMACY COMMUNICATION  **Broad Spectrum Antibiotics should be administered within 1 hour of Sepsis diagnosis**  Time Code Sepsis Called/Page Received: 1619  Antibiotics Ordered: Cefepime , metronidazole  and Vancomycin   Time of 1st antibiotic administration: cefepime  @ 1638  Additional action taken by pharmacy: none  If necessary, Name of Provider/Nurse Contacted: n/a   Brentlee Sciara Rodriguez-Guzman PharmD, BCPS 04/15/2024 4:35 PM

## 2024-04-15 NOTE — ED Provider Notes (Signed)
 "  [facility] Provider Note    Event Date/Time   First MD Initiated Contact with Patient 04/15/24 1612     (approximate)  History   Chief Complaint: Weakness  HPI  Ruben Garcia is a 78 y.o. male with a past medical history of anemia, hypertension, large B-cell lymphoma metastatic currently on chemotherapy, to the emergency department for generalized weakness fatigue found to be febrile to 101.4 per EMS.  I reviewed the patient's last oncology note from 04/07/2024.  Patient currently undergoing chemotherapy last which occurred 04/07/2024.  Patient states normally he will get weak and fatigued usually 5 to 7 days after chemotherapy.  However this time patient states is much more pronounced, found to be febrile to 102 at home.  Patient states a slight cough but states that is chronic.  Denies any vomiting or diarrhea.  Denies any chest or abdominal pain.  Physical Exam   Triage Vital Signs: ED Triage Vitals  Encounter Vitals Group     BP      Girls Systolic BP Percentile      Girls Diastolic BP Percentile      Boys Systolic BP Percentile      Boys Diastolic BP Percentile      Pulse      Resp      Temp      Temp src      SpO2      Weight      Height      Head Circumference      Peak Flow      Pain Score      Pain Loc      Pain Education      Exclude from Growth Chart     Most recent vital signs: There were no vitals filed for this visit.  General: Awake, no distress.  CV:  Good peripheral perfusion.  Regular rate and rhythm  Resp:  Normal effort.  Equal breath sounds bilaterally.  Abd:  No distention.  Soft, nontender.  No rebound or guarding.  ED Results / Procedures / Treatments   EKG  EKG viewed and interpreted by myself shows sinus tachycardia 138 bpm with a narrow QRS, normal axis, normal intervals, no concerning ST changes.  RADIOLOGY  I have reviewed interpret the chest x-ray images.  No consolidation on my evaluation. Radiology  is read the x-ray is negative  MEDICATIONS ORDERED IN ED: Medications  lactated ringers  bolus 1,000 mL (has no administration in time range)  ceFEPIme  (MAXIPIME ) 2 g in sodium chloride  0.9 % 100 mL IVPB (has no administration in time range)  metroNIDAZOLE  (FLAGYL ) IVPB 500 mg (has no administration in time range)  vancomycin  (VANCOCIN ) IVPB 1000 mg/200 mL premix (has no administration in time range)    IMPRESSION / MDM / ASSESSMENT AND PLAN / ED COURSE  I reviewed the triage vital signs and the nursing notes.  Patient's presentation is most consistent with acute presentation with potential threat to life or bodily function.  Patient with stage IV B-cell lymphoma presents to the emergency department for fever weakness fatigue.  Patient febrile to 100.2 in the emergency department 101.4 by EMS.  Patient is tachycardic mildly tachypneic.  Meets sepsis criteria we will start the patient broad-spectrum antibiotics will check labs send cultures and a chest x-ray we will obtain a viral panel.  Will continue to closely monitor while awaiting further results.  Patient's lab work has resulted showing mild hyponatremia on his chemistry.  Significant  lactic acid of 3.1.  Respiratory panel is negative.  Patient receiving IV fluids.  Patient's CBC is resulted with a white blood cell count of 0.3 and no neutrophils.  Chest x-ray is clear.  Urinalysis is pending.  Patient is being covered with broad-spectrum antibiotics.  We have sent cultures.  We will admit to the hospital service for further workup and treatment as the patient has a neutropenic fever in addition to sepsis.  CRITICAL CARE Performed by: Franky Moores   Total critical care time: 30 minutes  Critical care time was exclusive of separately billable procedures and treating other patients.  Critical care was necessary to treat or prevent imminent or life-threatening deterioration.  Critical care was time spent personally by me on the  following activities: development of treatment plan with patient and/or surrogate as well as nursing, discussions with consultants, evaluation of patient's response to treatment, examination of patient, obtaining history from patient or surrogate, ordering and performing treatments and interventions, ordering and review of laboratory studies, ordering and review of radiographic studies, pulse oximetry and re-evaluation of patient's condition.   FINAL CLINICAL IMPRESSION(S) / ED DIAGNOSES   Sepsis Fever Weakness Neutropenic fever  Note:  This document was prepared using Dragon voice recognition software and may include unintentional dictation errors.   Moores Franky, MD 04/15/24 1925  "

## 2024-04-15 NOTE — Sepsis Progress Note (Signed)
 eLink is following this Code Sepsis.

## 2024-04-15 NOTE — Progress Notes (Signed)
 The patient is refusing to have 2nd set of blood cultures obtained. He states, I am tired of being stuck for labs. Family member at bedside. Instructed to call for assistance.

## 2024-04-15 NOTE — Telephone Encounter (Signed)
 Wife called triage line. RN triage nurse assessment is incomplete as EMS is presently at her home and call was brief. Last treatment on 12/11 for Diffuse Large-Bcell lymphoma. Patient experiencing acute onset of AMS, weakness-unable to stand, generalized body aches, cough and shortness of breath. She had attempted to get his temp but stated that that the temp will not register. EMS has arrived and working w/patient. She wanted to let Dr. Anibal know. She stated that her husband is not tolerating treatments. She may need to have a future discussion in near future with Dr. B about this, but for now, she stated something is not right and I need to get him to the ER.

## 2024-04-16 DIAGNOSIS — R531 Weakness: Secondary | ICD-10-CM

## 2024-04-16 DIAGNOSIS — D709 Neutropenia, unspecified: Principal | ICD-10-CM

## 2024-04-16 DIAGNOSIS — R6510 Systemic inflammatory response syndrome (SIRS) of non-infectious origin without acute organ dysfunction: Secondary | ICD-10-CM

## 2024-04-16 DIAGNOSIS — E872 Acidosis, unspecified: Secondary | ICD-10-CM

## 2024-04-16 DIAGNOSIS — R5081 Fever presenting with conditions classified elsewhere: Secondary | ICD-10-CM

## 2024-04-16 DIAGNOSIS — D61818 Other pancytopenia: Secondary | ICD-10-CM

## 2024-04-16 LAB — CBC
HCT: 23.5 % — ABNORMAL LOW (ref 39.0–52.0)
Hemoglobin: 8 g/dL — ABNORMAL LOW (ref 13.0–17.0)
MCH: 32.1 pg (ref 26.0–34.0)
MCHC: 34 g/dL (ref 30.0–36.0)
MCV: 94.4 fL (ref 80.0–100.0)
Platelets: 56 K/uL — ABNORMAL LOW (ref 150–400)
RBC: 2.49 MIL/uL — ABNORMAL LOW (ref 4.22–5.81)
RDW: 14.1 % (ref 11.5–15.5)
WBC: 0.2 K/uL — CL (ref 4.0–10.5)
nRBC: 0 % (ref 0.0–0.2)

## 2024-04-16 LAB — C DIFFICILE QUICK SCREEN W PCR REFLEX
C Diff antigen: NEGATIVE
C Diff interpretation: NOT DETECTED
C Diff toxin: NEGATIVE

## 2024-04-16 LAB — LACTIC ACID, PLASMA: Lactic Acid, Venous: 1.4 mmol/L (ref 0.5–1.9)

## 2024-04-16 LAB — COMPREHENSIVE METABOLIC PANEL WITH GFR
ALT: 13 U/L (ref 0–44)
AST: 13 U/L — ABNORMAL LOW (ref 15–41)
Albumin: 3.4 g/dL — ABNORMAL LOW (ref 3.5–5.0)
Alkaline Phosphatase: 44 U/L (ref 38–126)
Anion gap: 9 (ref 5–15)
BUN: 14 mg/dL (ref 8–23)
CO2: 24 mmol/L (ref 22–32)
Calcium: 8.1 mg/dL — ABNORMAL LOW (ref 8.9–10.3)
Chloride: 98 mmol/L (ref 98–111)
Creatinine, Ser: 0.69 mg/dL (ref 0.61–1.24)
GFR, Estimated: >60 mL/min
Glucose, Bld: 102 mg/dL — ABNORMAL HIGH (ref 70–99)
Potassium: 3.6 mmol/L (ref 3.5–5.1)
Sodium: 132 mmol/L — ABNORMAL LOW (ref 135–145)
Total Bilirubin: 1.4 mg/dL — ABNORMAL HIGH (ref 0.0–1.2)
Total Protein: 5.7 g/dL — ABNORMAL LOW (ref 6.5–8.1)

## 2024-04-16 LAB — GASTROINTESTINAL PANEL BY PCR, STOOL (REPLACES STOOL CULTURE)

## 2024-04-16 LAB — MAGNESIUM: Magnesium: 2 mg/dL (ref 1.7–2.4)

## 2024-04-16 LAB — PHOSPHORUS: Phosphorus: 2.1 mg/dL — ABNORMAL LOW (ref 2.5–4.6)

## 2024-04-16 MED ORDER — ACETAMINOPHEN 650 MG RE SUPP: 650.0000 mg | Freq: Four times a day (QID) | RECTAL | Status: DC | PRN

## 2024-04-16 MED ORDER — ACETAMINOPHEN 325 MG PO TABS: 650.0000 mg | ORAL_TABLET | Freq: Four times a day (QID) | ORAL | Status: DC | PRN

## 2024-04-16 MED ORDER — CYCLOBENZAPRINE HCL 10 MG PO TABS: 10.0000 mg | ORAL_TABLET | Freq: Three times a day (TID) | ORAL | Status: DC | PRN

## 2024-04-16 MED ORDER — ONDANSETRON HCL 4 MG PO TABS: 4.0000 mg | ORAL_TABLET | Freq: Four times a day (QID) | ORAL | Status: DC | PRN

## 2024-04-16 MED ORDER — SODIUM CHLORIDE 0.9 % IV SOLN: INTRAVENOUS | Status: DC

## 2024-04-16 MED ORDER — SODIUM CHLORIDE 0.9 % IV SOLN
2.0000 g | Freq: Three times a day (TID) | INTRAVENOUS | Status: DC
  Administered 2024-04-16 – 2024-04-17 (×5): 2 g via INTRAVENOUS
  Filled 2024-04-16 (×6): qty 12.5

## 2024-04-16 MED ORDER — ENSURE PLUS HIGH PROTEIN PO LIQD
237.0000 mL | Freq: Two times a day (BID) | ORAL | Status: DC
  Administered 2024-04-16 (×2): 237 mL via ORAL

## 2024-04-16 MED ORDER — ONDANSETRON HCL 4 MG/2ML IJ SOLN: 4.0000 mg | Freq: Four times a day (QID) | INTRAMUSCULAR | Status: DC | PRN

## 2024-04-16 MED ORDER — ALBUTEROL SULFATE (2.5 MG/3ML) 0.083% IN NEBU: 2.5000 mg | INHALATION_SOLUTION | Freq: Four times a day (QID) | RESPIRATORY_TRACT | Status: DC | PRN

## 2024-04-16 MED ORDER — ACYCLOVIR 200 MG PO CAPS
400.0000 mg | ORAL_CAPSULE | Freq: Two times a day (BID) | ORAL | Status: DC
  Administered 2024-04-16 – 2024-04-17 (×3): 400 mg via ORAL
  Filled 2024-04-16 (×3): qty 2

## 2024-04-16 MED ORDER — FILGRASTIM-AAFI 480 MCG/0.8ML IJ SOSY
480.0000 ug | PREFILLED_SYRINGE | Freq: Once | INTRAMUSCULAR | Status: DC
  Filled 2024-04-16: qty 0.8

## 2024-04-16 MED ORDER — GUAIFENESIN-DM 100-10 MG/5ML PO SYRP
5.0000 mL | ORAL_SOLUTION | ORAL | Status: DC | PRN
  Administered 2024-04-16 (×4): 5 mL via ORAL
  Filled 2024-04-16 (×3): qty 10

## 2024-04-16 MED ORDER — VANCOMYCIN HCL IN DEXTROSE 1-5 GM/200ML-% IV SOLN
1000.0000 mg | Freq: Two times a day (BID) | INTRAVENOUS | Status: DC
  Administered 2024-04-16 – 2024-04-17 (×2): 1000 mg via INTRAVENOUS
  Filled 2024-04-16 (×3): qty 200

## 2024-04-16 MED ORDER — VANCOMYCIN HCL IN DEXTROSE 1-5 GM/200ML-% IV SOLN
1000.0000 mg | Freq: Once | INTRAVENOUS | Status: AC
  Administered 2024-04-16: 1000 mg via INTRAVENOUS
  Filled 2024-04-16: qty 200

## 2024-04-16 MED ORDER — GABAPENTIN 300 MG PO CAPS
300.0000 mg | ORAL_CAPSULE | Freq: Every day | ORAL | Status: DC
  Administered 2024-04-16: 300 mg via ORAL
  Filled 2024-04-16: qty 1

## 2024-04-16 MED ORDER — ALLOPURINOL 100 MG PO TABS
300.0000 mg | ORAL_TABLET | Freq: Two times a day (BID) | ORAL | Status: DC
  Administered 2024-04-16 – 2024-04-17 (×3): 300 mg via ORAL
  Filled 2024-04-16: qty 3
  Filled 2024-04-16: qty 1
  Filled 2024-04-16: qty 3

## 2024-04-16 MED ORDER — SODIUM CHLORIDE 0.9 % IV BOLUS
500.0000 mL | Freq: Once | INTRAVENOUS | Status: AC
  Administered 2024-04-16: 500 mL via INTRAVENOUS

## 2024-04-16 NOTE — Progress Notes (Signed)
 " Progress Note   Patient: Ruben Garcia FMW:[id number] DOB: 1945-12-01 DOA: 04/15/2024     1 DOS: the patient was seen and examined on 04/16/2024   Brief hospital course: 78 y.o. 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  Last chemotherapy session was on 04/07/2024, he states that he usually gets weak and fatigued for about 5 to 7 days after chemotherapy, however, the weakness and fatigue was much more pronounced this time and endorsed a fever of 102F at home.  He endorsed chronic cough, but denies chest pain, shortness of breath, vomiting, diarrhea.   He was recently admitted from 10/31 to 11/3 due to similar presentation.   ED course In the emergency department, He was tachypneic, tachycardic, temperature 100.2 F.  O2 sat 100% on room air.  Workup in the ED showed pancytopenia.  BMP was significant for hyponatremia.  Lactic acid 3.1 > 2.6, urinalysis was normal.  Influenza A, B, SARS, RSV, RSV was negative. Chest x-ray shows no active disease EKG personally reviewed shows sinus tachycardia at a rate of 138 bpm. He was treated with IV cefepime , Flagyl  and vancomycin .  IV hydration was provided.  TRH was asked to admit patient  12/20.  Empiric treatment for neutropenic fever.  Chest x-ray negative, urine analysis negative.  No meningeal signs.  Had some diarrhea.  Stool studies ordered.  Assessment and Plan: * Neutropenic fever Patient empirically on vancomycin  and Maxipime .  Urine analysis negative, chest x-ray negative.  Blood culture no growth for less than 12 hours.  No meningeal signs.  White blood cell count 0.2.  Case discussed with oncology to see later.  Systemic inflammatory response syndrome (HCC) Right now I do not have a infection source so I cannot classify this as sepsis at this point.  Patient was leukopenic tachycardic and febrile.   Also with lactic acidosis.  Pancytopenia (HCC) Secondary to recent chemotherapy  Lactic acidosis Given IV fluid hydration  Essential hypertension BP meds held on admission  Generalized weakness Physical therapy evaluation  DLBCL (diffuse large B cell lymphoma) (HCC) Follow-up as outpatient  Anemia of chronic disease End up needing a transfusion during the hospital course        Subjective: Patient came in with fever.  He is on chemotherapy for large B-cell lymphoma.  Complains of fatigue.  Found to have low white blood cell count and being treated for neutropenic fever.  Physical Exam: Vitals:   04/16/24 0930 04/16/24 1000 04/16/24 1158 04/16/24 1200  BP: 94/75 107/65  117/64  Pulse:    99  Resp: 19 19  20   Temp:   98.3 F (36.8 C)   TempSrc:   Oral   SpO2:    98%  Weight:       Physical Exam HENT:     Head: Normocephalic.     Mouth/Throat:     Pharynx: No oropharyngeal exudate.  Eyes:     General: Lids are normal.     Conjunctiva/sclera: Conjunctivae normal.  Cardiovascular:     Rate and Rhythm: Normal rate and regular rhythm.     Heart sounds: Normal heart sounds, S1 normal and S2 normal.  Pulmonary:     Breath sounds: No decreased breath sounds, wheezing, rhonchi or rales.  Abdominal:     Palpations: Abdomen is soft.     Tenderness: There is no abdominal tenderness.  Musculoskeletal:  Right lower leg: No swelling.     Left lower leg: No swelling.     Comments: Able to straight leg raise without a problem  Neurological:     Mental Status: He is alert and oriented to person, place, and time.     Comments: No meningeal signs     Data Reviewed: Sodium 132, creatinine 0.69 potassium 3.6, total bilirubin 1.4, lactic acid 1.4, white blood cell count 0.2, hemoglobin 8.0, platelet count 56  Family Communication: Daughter at bedside  Disposition: Status is: Inpatient Remains inpatient appropriate because: Treating empirically for neutropenic fever  with IV antibiotics.  Planned Discharge Destination: Home    Time spent: 28 minutes  Author: Charlie Patterson, MD 04/16/2024 12:35 PM  For on call review [email or web address].  "

## 2024-04-16 NOTE — Assessment & Plan Note (Signed)
 Given IV fluid hydration

## 2024-04-16 NOTE — ED Notes (Signed)
 Pt AOX4, respirations even and unlabored. Pt c/o nonproductive cough, PRN robitussin given see MAR. Pt's daughter bedside, breakfast tray given at this time.

## 2024-04-16 NOTE — Hospital Course (Signed)
 78 y.o. 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  Last chemotherapy session was on 04/07/2024, he states that he usually gets weak and fatigued for about 5 to 7 days after chemotherapy, however, the weakness and fatigue was much more pronounced this time and endorsed a fever of 102F at home.  He endorsed chronic cough, but denies chest pain, shortness of breath, vomiting, diarrhea.   He was recently admitted from 10/31 to 11/3 due to similar presentation.   ED course In the emergency department, He was tachypneic, tachycardic, temperature 100.2 F.  O2 sat 100% on room air.  Workup in the ED showed pancytopenia.  BMP was significant for hyponatremia.  Lactic acid 3.1 > 2.6, urinalysis was normal.  Influenza A, B, SARS, RSV, RSV was negative. Chest x-ray shows no active disease EKG personally reviewed shows sinus tachycardia at a rate of 138 bpm. He was treated with IV cefepime , Flagyl  and vancomycin .  IV hydration was provided.  TRH was asked to admit patient  12/20.  Empiric treatment for neutropenic fever.  Chest x-ray negative, urine analysis negative.  No meningeal signs.  Had some diarrhea.  Stool studies ordered.

## 2024-04-16 NOTE — Assessment & Plan Note (Signed)
 Follow up as outpatient

## 2024-04-16 NOTE — Assessment & Plan Note (Signed)
 Physical therapy recommended home with home health but patient declined.

## 2024-04-16 NOTE — ED Notes (Signed)
 This NT assisted the patient to the restroom with wheelchair. Patient had bowel movement. Patient was brought back to room and is currently resting in bed. No other needs at this time.

## 2024-04-16 NOTE — Assessment & Plan Note (Signed)
 Right now I do not have a infection source, so sepsis ruled out.  Patient was leukopenic tachycardic and febrile.  Also with lactic acidosis.

## 2024-04-16 NOTE — Assessment & Plan Note (Signed)
 BP meds held on admission

## 2024-04-16 NOTE — Assessment & Plan Note (Signed)
 Patient empirically on vancomycin  and Maxipime .  Urine analysis negative, chest x-ray negative.  Blood culture no growth for less than 12 hours.  No meningeal signs.  White blood cell count 0.2.  Case discussed with oncology to see later.

## 2024-04-16 NOTE — Progress Notes (Signed)
 Pharmacy Antibiotic Note  Ruben Garcia is a 78 y.o. male admitted on 04/15/2024 with febrile neutropenia.  Pharmacy has been consulted for Vancomycin , Cefepime   dosing.  Plan: Cefepime  2 gm IV Q8H ordered to start on 12/20 @ 0230.  Vancomycin  1 gm IV X 1 given in ED on 12/19 @ 1831.   Additional Vanc 1 gm IV X 1 ordered for 12/20 @ 0300 to make total loading dose of 2 gm.   Vancomycin  1 gm IV Q12H ordered to start on 12/20 @ 1200.  AUC = 509.3 Vanc trough = 14.7   Weight: 80.2 kg (176 lb 12.9 oz)  Temp (24hrs), Avg:100.2 F (37.9 C), Min:100.1 F (37.8 C), Max:100.2 F (37.9 C)  Recent Labs  Lab 04/15/24 1619 04/15/24 1836  WBC 0.3*  --   CREATININE 0.90  --   LATICACIDVEN 3.1* 2.6*    Estimated Creatinine Clearance: 76.6 mL/min (by C-G formula based on SCr of 0.9 mg/dL).    Allergies[1]  Antimicrobials this admission:   >>    >>   Dose adjustments this admission:   Microbiology results:  BCx:   UCx:    Sputum:    MRSA PCR:   Thank you for allowing pharmacy to be a part of this patients care.  Jerris Fleer D 04/16/2024 3:09 AM     [1]  Allergies Allergen Reactions   Codeine Nausea Only

## 2024-04-16 NOTE — Assessment & Plan Note (Signed)
 Hemoglobin 7.6.  Patient declined transfusion.  Case discussed with Dr. Melanee and they will follow-up as outpatient.

## 2024-04-16 NOTE — Assessment & Plan Note (Signed)
 Secondary to recent chemotherapy

## 2024-04-17 DIAGNOSIS — C8333 Diffuse large B-cell lymphoma, intra-abdominal lymph nodes: Secondary | ICD-10-CM

## 2024-04-17 DIAGNOSIS — D638 Anemia in other chronic diseases classified elsewhere: Secondary | ICD-10-CM

## 2024-04-17 LAB — CBC WITH DIFFERENTIAL/PLATELET
Abs Immature Granulocytes: 0.04 K/uL (ref 0.00–0.07)
Basophils Absolute: 0 K/uL (ref 0.0–0.1)
Basophils Relative: 2 %
Eosinophils Absolute: 0 K/uL (ref 0.0–0.5)
Eosinophils Relative: 2 %
HCT: 21.8 % — ABNORMAL LOW (ref 39.0–52.0)
Hemoglobin: 7.6 g/dL — ABNORMAL LOW (ref 13.0–17.0)
Immature Granulocytes: 7 %
Lymphocytes Relative: 27 %
Lymphs Abs: 0.2 K/uL — ABNORMAL LOW (ref 0.7–4.0)
MCH: 32.8 pg (ref 26.0–34.0)
MCHC: 34.9 g/dL (ref 30.0–36.0)
MCV: 94 fL (ref 80.0–100.0)
Monocytes Absolute: 0.2 K/uL (ref 0.1–1.0)
Monocytes Relative: 27 %
Neutro Abs: 0.2 K/uL — CL (ref 1.7–7.7)
Neutrophils Relative %: 35 %
Platelets: 54 K/uL — ABNORMAL LOW (ref 150–400)
RBC: 2.32 MIL/uL — ABNORMAL LOW (ref 4.22–5.81)
RDW: 13.9 % (ref 11.5–15.5)
WBC: 0.6 K/uL — CL (ref 4.0–10.5)
nRBC: 0 % (ref 0.0–0.2)

## 2024-04-17 LAB — TYPE AND SCREEN
ABO/RH(D): A POS
Antibody Screen: NEGATIVE

## 2024-04-17 LAB — CREATININE, SERUM
Creatinine, Ser: 0.57 mg/dL — ABNORMAL LOW (ref 0.61–1.24)
GFR, Estimated: >60 mL/min

## 2024-04-17 MED ORDER — LEVOFLOXACIN 750 MG PO TABS: 750.0000 mg | ORAL_TABLET | Freq: Every evening | ORAL | 0 refills | Status: AC

## 2024-04-17 MED ORDER — ENSURE PLUS HIGH PROTEIN PO LIQD: 237.0000 mL | Freq: Two times a day (BID) | ORAL | 0 refills | Status: AC

## 2024-04-17 NOTE — Progress Notes (Signed)
 Dr. Josette notified :Critical lab values:  WBC 0.6 and absolute neutrophils 0.2

## 2024-04-17 NOTE — Plan of Care (Signed)
" °  Problem: Education: Goal: Knowledge of General Education information will improve Description: Including pain rating scale, medication(s)/side effects and non-pharmacologic comfort measures Outcome: Adequate for Discharge   Problem: Health Behavior/Discharge Planning: Goal: Ability to manage health-related needs will improve Outcome: Adequate for Discharge   Problem: Clinical Measurements: Goal: Ability to maintain clinical measurements within normal limits will improve Outcome: Adequate for Discharge Goal: Will remain free from infection Outcome: Adequate for Discharge Goal: Diagnostic test results will improve Outcome: Adequate for Discharge Goal: Respiratory complications will improve Outcome: Adequate for Discharge Goal: Cardiovascular complication will be avoided Outcome: Adequate for Discharge   Problem: Activity: Goal: Risk for activity intolerance will decrease Outcome: Adequate for Discharge   Problem: Nutrition: Goal: Adequate nutrition will be maintained Outcome: Adequate for Discharge   Problem: Coping: Goal: Level of anxiety will decrease Outcome: Adequate for Discharge   Problem: Elimination: Goal: Will not experience complications related to bowel motility Outcome: Adequate for Discharge Goal: Will not experience complications related to urinary retention Outcome: Adequate for Discharge   Problem: Pain Managment: Goal: General experience of comfort will improve and/or be controlled Outcome: Adequate for Discharge   Problem: Safety: Goal: Ability to remain free from injury will improve Outcome: Adequate for Discharge   Problem: Safety: Goal: Ability to remain free from injury will improve Outcome: Adequate for Discharge   Problem: Skin Integrity: Goal: Risk for impaired skin integrity will decrease Outcome: Adequate for Discharge   Problem: Acute Rehab PT Goals(only PT should resolve) Goal: Patient Will Transfer Sit To/From Stand Outcome:  Adequate for Discharge Goal: Pt Will Ambulate Outcome: Adequate for Discharge Goal: Pt Will Go Up/Down Stairs Outcome: Adequate for Discharge   "

## 2024-04-17 NOTE — Consult Note (Signed)
 "  Hematology/Oncology Consult note [facility] [phone] [phone]  Patient Care Team: Myrla Jon HERO, MD as PCP - General (Family Medicine) Verdene Gills, RN as Oncology Nurse Navigator [name], MD as Consulting Physician (Oncology)   Name of the patient: Ruben Garcia  [id number]  [birth year]    Reason for consult: neutropenic fever   Requesting physician: Dr. 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 Rennie as an outpatient.  He received cycle 6 of Pola R CHP chemotherapy on 04/07/2024 with Neulasta  support.  He has also been receiving intrathecal methotrexate  with each treatment.  He presented to the ER with symptoms of fever of 102 at home with generalized weakness and fatigue.  He has been having chronic cough since treatment.  Workup in the hospital was unrevealing for source of infection and he was treated with IV cefepime  Flagyl  and vancomycin .  Blood cultures so far have been negative and no clear source of infection.  At the time of my visit today patient feels better overall and would like to go home by tomorrow  ECOG PS- 1  Pain scale- 0   Review of systems- Review of Systems  Constitutional:  Positive for malaise/fatigue. Negative for chills, fever and weight loss.  HENT:  Negative for congestion, ear discharge and nosebleeds.   Eyes:  Negative for blurred vision.  Respiratory:  Positive for cough. Negative for hemoptysis, sputum production, shortness of breath and wheezing.   Cardiovascular:  Negative for chest pain, palpitations, orthopnea and claudication.  Gastrointestinal:  Negative for abdominal pain, blood in stool, constipation, diarrhea, heartburn, melena, nausea and vomiting.  Genitourinary:  Negative for dysuria, flank pain, frequency, hematuria and urgency.   Musculoskeletal:  Negative for back pain, joint pain and myalgias.  Skin:  Negative for rash.  Neurological:  Negative for dizziness, tingling, focal weakness, seizures, weakness and headaches.  Endo/Heme/Allergies:  Does not bruise/bleed easily.  Psychiatric/Behavioral:  Negative for depression and suicidal ideas. The patient does not have insomnia.     Allergies[1]  Patient Active Problem List   Diagnosis Date Noted   Systemic inflammatory response syndrome (HCC) 04/16/2024   Lactic acidosis 04/16/2024   Generalized weakness 04/16/2024   Narrowing of airway 03/09/2024   Chronic cough 03/09/2024   Neutropenic fever 02/27/2024   Severe sepsis with acute organ dysfunction (HCC) 02/26/2024   Pancytopenia (HCC) 02/26/2024   Hypokalemia 02/18/2024   DLBCL (diffuse large B cell lymphoma) (HCC) 12/01/2023   Cancer, metastatic to bone (HCC) 11/27/2023   Chronic pain syndrome 11/25/2023   Lung mass 11/21/2023   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) 11/21/2023   Overweight (BMI 25.0-29.9) 11/21/2023   Anemia of chronic disease 11/21/2023   Elevated liver function tests 11/21/2023   Acute midline low back pain with bilateral sciatica 11/20/2023   Hypercalcemia 11/20/2023   Inadequate pain control 11/19/2023   Nausea 11/19/2023   Hyperglycemia 10/08/2023   Obesity 02/03/2017   Full thickness rotator cuff tear 04/08/2016   Impingement syndrome of shoulder region 04/08/2016   Enlarged prostate with lower urinary tract symptoms (LUTS) 06/26/2015   Personal history of other malignant neoplasm of skin 12/11/2014   Essential hypertension 11/29/2012   Pure hypercholesterolemia 11/29/2012     Past Medical History:  Diagnosis Date   Allergic rhinitis, cause unspecified    Anemia    Basal cell carcinoma  of skin, site unspecified 04/28/2010   Nasal; [place].   BPH (benign prostatic hypertrophy)    Chronic cough    Colon polyps    Diffuse large B-cell lymphoma of intra-abdominal lymph  nodes (HCC) 2025   Dyspnea    Essential hypertension, benign    Hemorrhoids, internal    Incomplete bladder emptying    Narrowing of airway    Other abnormal glucose    Over weight    Personal history of colonic polyps    Personal history of other genital system and obstetric disorders(V13.29)    Pneumonia    Pure hypercholesterolemia    Unspecified disorder of kidney and ureter    Unspecified disorder of liver      Past Surgical History:  Procedure Laterality Date   BASAL CELL CARCINOMA EXCISION  2012   Facial        [place]   COLONOSCOPY  11/13/2010   single polyp; repeatin 5 years; [place]   COLONOSCOPY N/A 11/04/2021   Procedure: COLONOSCOPY;  Surgeon: Maryruth Ole DASEN, MD;  Location: [facility] ENDOSCOPY;  Service: Endoscopy;  Laterality: N/A;  REQUEST EARLY TIME   COLONOSCOPY WITH PROPOFOL  N/A 12/13/2015   Procedure: COLONOSCOPY WITH PROPOFOL ;  Surgeon: Lamar DASEN Holmes, MD;  Location: [facility] ENDOSCOPY;  Service: Endoscopy;  Laterality: N/A;   ear surgery  05/2012   [name].  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  Benign.   HEMORRHOID SURGERY  1978   [name]   IR IMAGING GUIDED PORT INSERTION  12/04/2023   LAPAROSCOPIC CHOLECYSTECTOMY  1982   rotator cuff surgery Right 05/14/2016   VASECTOMY  1978   VIDEO BRONCHOSCOPY WITH ENDOBRONCHIAL ULTRASOUND Bilateral 03/15/2024   Procedure: BRONCHOSCOPY, WITH EBUS;  Surgeon: Isadora Hose, MD;  Location: [facility] ORS;  Service: Pulmonary;  Laterality: Bilateral;    Social History   Socioeconomic History   Marital status: Married    Spouse name: Heron   Number of children: 2   Years of education: 2 years college   Highest education level: Not on file  Occupational History   Occupation: retired    Comment: postal service in 2005  Tobacco Use   Smoking status: Never   Smokeless tobacco: Never  Vaping Use   Vaping status: Never Used  Substance and Sexual Activity   Alcohol use: Not Currently    Alcohol/week: 5.0 standard drinks of alcohol     Types: 5 Cans of beer per week    Comment: weekends beer, 4- 6 total Cleotilde Mallory   Drug use: No   Sexual activity: Not Currently    Partners: Female  Other Topics Concern   Not on file  Social History Narrative   Always uses seat belts. Smoke alarm and carbon monoxide detector in the home. Guns in the home stored in locked cabinet.Caffeine use: Coffee 2 servings per day, moderate amount. Exercise: Moderate walking 2 - 4 miles daily, 2 - 3 days per week.    Marital status:  Married x 47 years, happily.      Children:  2 children; 1 grandchild (20).      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      Tobacco: never      Alcohol:  Weekends; beer 4-6 per weekend.      Exercise:  Walking every other day 2.5 miles three times per day.      Seatbelt: 100%; no texting      Guns: secured guns.      Living Will: completed in  2014.  FULL CODE but no prolonged measures.        ADLs: independent with ADLs; no assistant devices   Social Drivers of Health   Tobacco Use: Low Risk (04/15/2024)   Patient History    Smoking Tobacco Use: Never    Smokeless Tobacco Use: Never    Passive Exposure: Not on file  Financial Resource Strain: Not on file  Food Insecurity: No Food Insecurity (04/16/2024)   Epic    Worried About Programme Researcher, Broadcasting/film/video in the Last Year: Never true    Ran Out of Food in the Last Year: Never true  Transportation Needs: No Transportation Needs (04/16/2024)   Epic    Lack of Transportation (Medical): No    Lack of Transportation (Non-Medical): No  Physical Activity: Not on file  Stress: Not on file  Social Connections: Moderately Isolated (04/16/2024)   Social Connection and Isolation Panel    Frequency of Communication with Friends and Family: More than three times a week    Frequency of Social Gatherings with Friends and Family: Never    Attends Religious Services: Never    Database Administrator or Organizations: No    Attends Banker Meetings:  Never    Marital Status: Married  Catering Manager Violence: Not At Risk (04/16/2024)   Epic    Fear of Current or Ex-Partner: No    Emotionally Abused: No    Physically Abused: No    Sexually Abused: No  Depression (PHQ2-9): Low Risk (04/07/2024)   Depression (PHQ2-9)    PHQ-2 Score: 0  Alcohol Screen: Low Risk (09/12/2022)   Alcohol Screen    Last Alcohol Screening Score (AUDIT): 5  Housing: Low Risk (04/16/2024)   Epic    Unable to Pay for Housing in the Last Year: No    Number of Times Moved in the Last Year: 0    Homeless in the Last Year: No  Utilities: Not At Risk (04/16/2024)   Epic    Threatened with loss of utilities: No  Health Literacy: Not on file     Family History  Problem Relation Age of Onset   Heart disease Mother        first MI in 76s   Diabetes Mother    Hyperlipidemia Mother    Arthritis Mother        rheumatoid   Cancer Father        lung   Diabetes Father    Arthritis Sister    Hyperlipidemia Sister    Hyperlipidemia Sister    Hypertension Sister    Hyperlipidemia Sister    Kidney disease Neg Hx    Prostate cancer Neg Hx    Colon cancer Neg Hx     Current Medications[2]   Physical exam:  Vitals:   04/16/24 2342 04/17/24 0349 04/17/24 0815 04/17/24 0828  BP: 133/75 132/83 121/66 126/75  Pulse: (!) 104 (!) 101 97 99  Resp: 18 18 13 20   Temp: 98.8 F (37.1 C) 98.6 F (37 C) 98.4 F (36.9 C) 97.7 F (36.5 C)  TempSrc: Oral Oral Oral   SpO2: 100% 100% 100% 98%  Weight:      Height:       Physical Exam Cardiovascular:     Rate and Rhythm: Normal rate and regular rhythm.     Heart sounds: Normal heart sounds.  Pulmonary:     Effort: Pulmonary effort is normal.     Breath sounds: Normal breath sounds.  Abdominal:  General: Bowel sounds are normal. There is no distension.     Palpations: Abdomen is soft.     Tenderness: There is no abdominal tenderness.  Skin:    General: Skin is warm and dry.  Neurological:     Mental  Status: He is alert and oriented to person, place, and time.           Latest Ref Rng & Units 04/17/2024    5:39 AM  CMP  Creatinine 0.61 - 1.24 mg/dL 9.42       Latest Ref Rng & Units 04/17/2024    5:39 AM  CBC  WBC 4.0 - 10.5 K/uL 0.6   Hemoglobin 13.0 - 17.0 g/dL 7.6   Hematocrit 60.9 - 52.0 % 21.8   Platelets 150 - 400 K/uL 54     @IMAGES @  DG Chest Port 1 View Result Date: 04/15/2024 CLINICAL DATA:  Possible sepsis lung and bone cancer EXAM: PORTABLE CHEST 1 VIEW COMPARISON:  03/15/2024 FINDINGS: Right-sided central venous port tip over the SVC. No focal opacity, pleural effusion or pneumothorax. Stable cardiomediastinal silhouette with aortic atherosclerosis. IMPRESSION: No active disease. Electronically Signed   By: Luke Bun M.D.   On: 04/15/2024 17:24   DG FL LUMBAR PUNCTURE W/CHEMO INJECT Result Date: 04/08/2024 CLINICAL DATA:  78 year old male with history of diffuse large B-cell lymphoma, currently undergoing chemotherapy. IR was requested for lumbar puncture with intrathecal methotrexate  injection. EXAM: FLUOROSCOPICALLY GUIDED LUMBAR PUNCTURE FOR INTRATHECAL CHEMOTHERAPY FLUOROSCOPY: Radiation Exposure Index (as provided by the fluoroscopic device): 7.5 mGy Kerma PROCEDURE: Informed consent was obtained from the patient prior to the procedure, including potential complications of headache, allergy, and pain. With the patient prone, the lower back was prepped with Betadine. 1% Lidocaine  was used for local anesthesia. Lumbar puncture was performed at the L4-L5 level using a 22-gauge needle with return of clearCSF. 5 mL of methotrexate  was injected into the subarachnoid space. The patient tolerated the procedure well without apparent complication. IMPRESSION: Successful lumbar puncture with intrathecal injection of chemotherapy. No immediate complication. Procedure performed by: Carlin Griffon, PA-C, and supervised by: Harrietta Sherry, MD Electronically Signed   By:  Harrietta Sherry M.D.   On: 04/08/2024 12:29    Assessment and plan- Patient is a 78 y.o. male with history of stage IV diffuse large B-cell lymphoma s/p 6 cycles of Pola R CHP chemotherapy and intrathecal methotrexate  admitted for neutropenic fever and generalized weakness  Neutropenia expected due to chemotherapy.  He already received Neulasta  with cycle 6 of Pola R CHP and therefore does not require any Neupogen  at this time.  Pancytopenia expected to recover over the next 2 weeks.  He has been afebrile in the hospital on IV antibiotics and given the clinical improvement patient can be discharged tomorrow on oral antibiotics.  We will have close follow-up with the patient as an outpatient      Visit Diagnosis 1. Sepsis, due to unspecified organism, unspecified whether acute organ dysfunction present (HCC)   2. Neutropenic fever     Dr. Annah Skene, MD, MPH [facility] [phone] 04/17/2024                   [1]  Allergies Allergen Reactions   Codeine Nausea Only  [2]  Current Facility-Administered Medications:    acetaminophen  (TYLENOL ) tablet 650 mg, 650 mg, Oral, Q6H PRN **OR** acetaminophen  (TYLENOL ) suppository 650 mg, 650 mg, Rectal, Q6H PRN, Adefeso, Oladapo, DO   acyclovir  (ZOVIRAX ) 200 MG capsule  400 mg, 400 mg, Oral, BID, Josette, Richard, MD, 400 mg at 04/17/24 9048   albuterol  (PROVENTIL ) (2.5 MG/3ML) 0.083% nebulizer solution 2.5 mg, 2.5 mg, Inhalation, Q6H PRN, Josette Ade, MD   allopurinol  (ZYLOPRIM ) tablet 300 mg, 300 mg, Oral, BID, Josette, Richard, MD, 300 mg at 04/17/24 9048   ceFEPIme  (MAXIPIME ) 2 g in sodium chloride  0.9 % 100 mL IVPB, 2 g, Intravenous, Q8H, Adefeso, Oladapo, DO, Last Rate: 200 mL/hr at 04/17/24 0952, 2 g at 04/17/24 9047   cyclobenzaprine  (FLEXERIL ) tablet 10 mg, 10 mg, Oral, TID PRN, Josette Ade, MD   feeding supplement (ENSURE PLUS HIGH PROTEIN) liquid 237 mL, 237 mL, Oral, BID BM, Adefeso,  Oladapo, DO, 237 mL at 04/16/24 1308   gabapentin  (NEURONTIN ) capsule 300 mg, 300 mg, Oral, QHS, Wieting, Richard, MD, 300 mg at 04/16/24 2144   guaiFENesin -dextromethorphan  (ROBITUSSIN DM) 100-10 MG/5ML syrup 5 mL, 5 mL, Oral, Q4H PRN, Adefeso, Oladapo, DO, 5 mL at 04/16/24 2148   ondansetron  (ZOFRAN ) tablet 4 mg, 4 mg, Oral, Q6H PRN **OR** ondansetron  (ZOFRAN ) injection 4 mg, 4 mg, Intravenous, Q6H PRN, Adefeso, Oladapo, DO  Current Outpatient Medications:    acetaminophen  (TYLENOL ) 325 MG tablet, Take 2 tablets (650 mg total) by mouth every 6 (six) hours as needed for mild pain (pain score 1-3), fever, headache or moderate pain (pain score 4-6) (or Fever >/= 101)., Disp: , Rfl:    acyclovir  (ZOVIRAX ) 400 MG tablet, Take 1 tablet (400 mg total) by mouth 2 (two) times daily. [to prevent shingles], Disp: 60 tablet, Rfl: 3   albuterol  (VENTOLIN  HFA) 108 (90 Base) MCG/ACT inhaler, Inhale 1-2 puffs into the lungs every 6 (six) hours as needed., Disp: 18 g, Rfl: 0   allopurinol  (ZYLOPRIM ) 300 MG tablet, Take 1 tablet (300 mg total) by mouth 2 (two) times daily., Disp: 120 tablet, Rfl: 0   benzonatate  (TESSALON ) 100 MG capsule, Take 1-2 capsules (100-200 mg total) by mouth 3 (three) times daily as needed for cough., Disp: 90 capsule, Rfl: 0   chlorpheniramine-HYDROcodone  (TUSSIONEX) 10-8 MG/5ML, Take 5 mLs by mouth at bedtime as needed for cough., Disp: , Rfl:    cyclobenzaprine  (FLEXERIL ) 10 MG tablet, Take 1 tablet (10 mg total) by mouth 3 (three) times daily as needed for muscle spasms., Disp: 30 tablet, Rfl: 2   gabapentin  (NEURONTIN ) 300 MG capsule, Take 1 capsule (300 mg total) by mouth at bedtime., Disp: 90 capsule, Rfl: 0   lactulose  (CHRONULAC ) 10 GM/15ML solution, Take 15 mLs (10 g total) by mouth daily as needed for mild constipation., Disp: 236 mL, Rfl: 0   levofloxacin  (LEVAQUIN ) 750 MG tablet, Take 1 tablet (750 mg total) by mouth every evening for 5 days., Disp: 5 tablet, Rfl: 0    ondansetron  (ZOFRAN ) 4 MG tablet, Take 1 tablet (4 mg total) by mouth every 8 (eight) hours as needed for nausea or vomiting., Disp: 30 tablet, Rfl: 0   predniSONE  (DELTASONE ) 50 MG tablet, Take 2 tablets once day x 5 days. START on day of your chemotherapy. Take in AM; with FOOD., Disp: 10 tablet, Rfl: 5   feeding supplement (ENSURE PLUS HIGH PROTEIN) LIQD, Take 237 mLs by mouth 2 (two) times daily between meals., Disp: 14220 mL, Rfl: 0  "

## 2024-04-17 NOTE — Discharge Summary (Signed)
 " Physician Discharge Summary   Patient: Ruben Garcia MRN: [id number] DOB: Dec 12, 1945  Admit date:     04/15/2024  Discharge date: 04/17/2024  Discharge Physician: Charlie Patterson   PCP: Myrla Jon HERO, MD   Recommendations at discharge:   Follow-up hematology 1 week Follow-up PCP 5 days  Discharge Diagnoses: Principal Problem:   Neutropenic fever Active Problems:   Systemic inflammatory response syndrome (HCC)   Pancytopenia (HCC)   Lactic acidosis   Essential hypertension   Anemia of chronic disease   DLBCL (diffuse large B cell lymphoma) (HCC)   Generalized weakness  Hospital Course: 78 y.o. 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  Last chemotherapy session was on 04/07/2024, he states that he usually gets weak and fatigued for about 5 to 7 days after chemotherapy, however, the weakness and fatigue was much more pronounced this time and endorsed a fever of 102F at home.  He endorsed chronic cough, but denies chest pain, shortness of breath, vomiting, diarrhea.   He was recently admitted from 10/31 to 11/3 due to similar presentation.   ED course In the emergency department, He was tachypneic, tachycardic, temperature 100.2 F.  O2 sat 100% on room air.  Workup in the ED showed pancytopenia.  BMP was significant for hyponatremia.  Lactic acid 3.1 > 2.6, urinalysis was normal.  Influenza A, B, SARS, RSV, RSV was negative. Chest x-ray shows no active disease EKG personally reviewed shows sinus tachycardia at a rate of 138 bpm. He was treated with IV cefepime , Flagyl  and vancomycin .  IV hydration was provided.  [facility] was asked to admit patient  12/20.  Empiric treatment for neutropenic fever.  Chest x-ray negative, urine analysis negative.  No meningeal signs.  Had some diarrhea.  Stool studies negative. 12/21.  Patient feeling  better wants to go home.  White blood cell count 0.6.  Given a dose of Maxipime  prior to going home and switch over to 5 days of Levaquin  upon discharge.  Patient did not want a blood transfusion on a hemoglobin of 7.6.  Spoke with Dr. Melanee and they will follow-up as outpatient  Assessment and Plan: * Neutropenic fever Patient empirically on vancomycin  and Maxipime .  Urine analysis negative, chest x-ray negative.  Blood culture no growth for 2 days.  No meningeal signs.  White blood cell count 0.6.  Case discussed with oncology and okay with discharge home since patient afebrile for the last 24 hours.  Switch to p.o. Levaquin  for 5 days.  Systemic inflammatory response syndrome (HCC) Right now I do not have a infection source, so sepsis ruled out.  Patient was leukopenic tachycardic and febrile.  Also with lactic acidosis.  Pancytopenia (HCC) Secondary to recent chemotherapy  Lactic acidosis Given IV fluid hydration  Essential hypertension BP meds held on admission  Generalized weakness Physical therapy recommended home with home health but patient declined.  DLBCL (diffuse large B cell lymphoma) (HCC) Follow-up as outpatient  Anemia of chronic disease Hemoglobin 7.6.  Patient declined transfusion.  Case discussed with Dr. Melanee and they will follow-up as outpatient.         Consultants: Oncology Procedures performed: None Disposition: Home Diet recommendation:  Regular diet DISCHARGE MEDICATION: Allergies as of 04/17/2024       Reactions   Codeine Nausea Only        Medication List     STOP taking  these medications    HYDROcodone  bit-homatropine 5-1.5 MG/5ML syrup Commonly known as: Hydromet   prochlorperazine  10 MG tablet Commonly known as: COMPAZINE    traMADol  50 MG tablet Commonly known as: ULTRAM        TAKE these medications    acetaminophen  325 MG tablet Commonly known as: TYLENOL  Take 2 tablets (650 mg total) by mouth every 6 (six) hours as  needed for mild pain (pain score 1-3), fever, headache or moderate pain (pain score 4-6) (or Fever >/= 101).   acyclovir  400 MG tablet Commonly known as: ZOVIRAX  Take 1 tablet (400 mg total) by mouth 2 (two) times daily. [to prevent shingles]   albuterol  108 (90 Base) MCG/ACT inhaler Commonly known as: VENTOLIN  HFA Inhale 1-2 puffs into the lungs every 6 (six) hours as needed.   allopurinol  300 MG tablet Commonly known as: ZYLOPRIM  Take 1 tablet (300 mg total) by mouth 2 (two) times daily.   benzonatate  100 MG capsule Commonly known as: TESSALON  Take 1-2 capsules (100-200 mg total) by mouth 3 (three) times daily as needed for cough.   chlorpheniramine-HYDROcodone  10-8 MG/5ML Commonly known as: TUSSIONEX Take 5 mLs by mouth at bedtime as needed for cough. What changed: Another medication with the same name was removed. Continue taking this medication, and follow the directions you see here.   cyclobenzaprine  10 MG tablet Commonly known as: FLEXERIL  Take 1 tablet (10 mg total) by mouth 3 (three) times daily as needed for muscle spasms.   feeding supplement Liqd Take 237 mLs by mouth 2 (two) times daily between meals.   gabapentin  300 MG capsule Commonly known as: NEURONTIN  Take 1 capsule (300 mg total) by mouth at bedtime.   lactulose  10 GM/15ML solution Commonly known as: CHRONULAC  Take 15 mLs (10 g total) by mouth daily as needed for mild constipation.   levofloxacin  750 MG tablet Commonly known as: Levaquin  Take 1 tablet (750 mg total) by mouth every evening for 5 days.   ondansetron  4 MG tablet Commonly known as: Zofran  Take 1 tablet (4 mg total) by mouth every 8 (eight) hours as needed for nausea or vomiting.   predniSONE  50 MG tablet Commonly known as: DELTASONE  Take 2 tablets once day x 5 days. START on day of your chemotherapy. Take in AM; with FOOD.        Follow-up Information     Myrla Jon HERO, MD Follow up in 5 day(s).   Specialty: Family  Medicine Contact information: [address] [place] [address] [place] [address] [phone]         [name], MD Follow up.   Specialties: Internal Medicine, Oncology Why: keep scheduled appointment Contact information: [address] [place] [address] [phone]                Discharge Exam: Fredricka Weights   04/15/24 1716 04/16/24 1600  Weight: 80.2 kg 80.2 kg   Physical Exam HENT:     Head: Normocephalic.     Mouth/Throat:     Pharynx: No oropharyngeal exudate.  Eyes:     General: Lids are normal.     Conjunctiva/sclera: Conjunctivae normal.  Cardiovascular:     Rate and Rhythm: Normal rate and regular rhythm.     Heart sounds: Normal heart sounds, S1 normal and S2 normal.  Pulmonary:     Breath sounds: No decreased breath sounds, wheezing, rhonchi or rales.  Abdominal:     Palpations: Abdomen is soft.     Tenderness: There is no  abdominal tenderness.  Musculoskeletal:     Right lower leg: No swelling.     Left lower leg: No swelling.     Comments: Able to straight leg raise without a problem  Neurological:     Mental Status: He is alert and oriented to person, place, and time.      Condition at discharge: stable  The results of significant diagnostics from this hospitalization (including imaging, microbiology, ancillary and laboratory) are listed below for reference.   Imaging Studies: DG Chest Port 1 View Result Date: 04/15/2024 CLINICAL DATA:  Possible sepsis lung and bone cancer EXAM: PORTABLE CHEST 1 VIEW COMPARISON:  03/15/2024 FINDINGS: Right-sided central venous port tip over the SVC. No focal opacity, pleural effusion or pneumothorax. Stable cardiomediastinal silhouette with aortic atherosclerosis. IMPRESSION: No active disease. Electronically Signed   By: Luke Bun M.D.   On: 04/15/2024 17:24   DG FL LUMBAR PUNCTURE W/CHEMO INJECT Result Date: 04/08/2024 CLINICAL DATA:  78 year old male with history of diffuse  large B-cell lymphoma, currently undergoing chemotherapy. IR was requested for lumbar puncture with intrathecal methotrexate  injection. EXAM: FLUOROSCOPICALLY GUIDED LUMBAR PUNCTURE FOR INTRATHECAL CHEMOTHERAPY FLUOROSCOPY: Radiation Exposure Index (as provided by the fluoroscopic device): 7.5 mGy Kerma PROCEDURE: Informed consent was obtained from the patient prior to the procedure, including potential complications of headache, allergy, and pain. With the patient prone, the lower back was prepped with Betadine. 1% Lidocaine  was used for local anesthesia. Lumbar puncture was performed at the L4-L5 level using a 22-gauge needle with return of clearCSF. 5 mL of methotrexate  was injected into the subarachnoid space. The patient tolerated the procedure well without apparent complication. IMPRESSION: Successful lumbar puncture with intrathecal injection of chemotherapy. No immediate complication. Procedure performed by: Carlin Griffon, PA-C, and supervised by: Harrietta Sherry, MD Electronically Signed   By: Harrietta Sherry M.D.   On: 04/08/2024 12:29    Microbiology: Results for orders placed or performed during the hospital encounter of 04/15/24  Resp panel by RT-PCR (RSV, Flu A&B, Covid) Anterior Nasal Swab     Status: None   Collection Time: 04/15/24  4:19 PM   Specimen: Anterior Nasal Swab  Result Value Ref Range Status   SARS Coronavirus 2 by RT PCR NEGATIVE NEGATIVE Final    Comment: (NOTE) SARS-CoV-2 target nucleic acids are NOT DETECTED.  The SARS-CoV-2 RNA is generally detectable in upper respiratory specimens during the acute phase of infection. The lowest concentration of SARS-CoV-2 viral copies this assay can detect is 138 copies/mL. A negative result does not preclude SARS-Cov-2 infection and should not be used as the sole basis for treatment or other patient management decisions. A negative result may occur with  improper specimen collection/handling, submission of specimen other than  nasopharyngeal swab, presence of viral mutation(s) within the areas targeted by this assay, and inadequate number of viral copies(<138 copies/mL). A negative result must be combined with clinical observations, patient history, and epidemiological information. The expected result is Negative.  Fact Sheet for Patients:  bloggercourse.com  Fact Sheet for Healthcare Providers:  seriousbroker.it  This test is no t yet approved or cleared by the [place]  FDA and  has been authorized for detection and/or diagnosis of SARS-CoV-2 by FDA under an Emergency Use Authorization (EUA). This EUA will remain  in effect (meaning this test can be used) for the duration of the COVID-19 declaration under Section 564(b)(1) of the Act, 21 U.S.C.section 360bbb-3(b)(1), unless the authorization is terminated  or revoked sooner.  Influenza A by PCR NEGATIVE NEGATIVE Final   Influenza B by PCR NEGATIVE NEGATIVE Final    Comment: (NOTE) The Xpert Xpress SARS-CoV-2/FLU/RSV plus assay is intended as an aid in the diagnosis of influenza from Nasopharyngeal swab specimens and should not be used as a sole basis for treatment. Nasal washings and aspirates are unacceptable for Xpert Xpress SARS-CoV-2/FLU/RSV testing.  Fact Sheet for Patients: bloggercourse.com  Fact Sheet for Healthcare Providers: seriousbroker.it  This test is not yet approved or cleared by the [place]  FDA and has been authorized for detection and/or diagnosis of SARS-CoV-2 by FDA under an Emergency Use Authorization (EUA). This EUA will remain in effect (meaning this test can be used) for the duration of the COVID-19 declaration under Section 564(b)(1) of the Act, 21 U.S.C. section 360bbb-3(b)(1), unless the authorization is terminated or revoked.     Resp Syncytial Virus by PCR NEGATIVE NEGATIVE Final    Comment:  (NOTE) Fact Sheet for Patients: bloggercourse.com  Fact Sheet for Healthcare Providers: seriousbroker.it  This test is not yet approved or cleared by the [place]  FDA and has been authorized for detection and/or diagnosis of SARS-CoV-2 by FDA under an Emergency Use Authorization (EUA). This EUA will remain in effect (meaning this test can be used) for the duration of the COVID-19 declaration under Section 564(b)(1) of the Act, 21 U.S.C. section 360bbb-3(b)(1), unless the authorization is terminated or revoked.  Performed at [facility], [address] [place], [place] [address]   Blood Culture (routine x 2)     Status: None (Preliminary result)   Collection Time: 04/15/24  4:19 PM   Specimen: BLOOD  Result Value Ref Range Status   Specimen Description BLOOD BLOOD LEFT FOREARM  Final   Special Requests   Final    BOTTLES DRAWN AEROBIC AND ANAEROBIC Blood Culture results may not be optimal due to an inadequate volume of blood received in culture bottles   Culture   Final    NO GROWTH 2 DAYS Performed at [facility], [address] [place], [place] [address]    Report Status PENDING  Incomplete  C Difficile Quick Screen w PCR reflex     Status: None   Collection Time: 04/16/24  6:45 PM   Specimen: STOOL  Result Value Ref Range Status   C Diff antigen NEGATIVE NEGATIVE Final   C Diff toxin NEGATIVE NEGATIVE Final   C Diff interpretation No C. difficile detected.  Final    Comment: Performed at [facility], [address] [place], [place] [address]  Gastrointestinal Panel by PCR , Stool     Status: None   Collection Time: 04/16/24  6:45 PM   Specimen: Stool  Result Value Ref Range Status   Campylobacter species NOT DETECTED NOT DETECTED Final   Plesimonas shigelloides NOT DETECTED NOT DETECTED Final   Salmonella species NOT DETECTED NOT DETECTED Final   Yersinia enterocolitica NOT  DETECTED NOT DETECTED Final   Vibrio species NOT DETECTED NOT DETECTED Final   Vibrio cholerae NOT DETECTED NOT DETECTED Final   Enteroaggregative E coli (EAEC) NOT DETECTED NOT DETECTED Final   Enteropathogenic E coli (EPEC) NOT DETECTED NOT DETECTED Final   Enterotoxigenic E coli (ETEC) NOT DETECTED NOT DETECTED Final   Shiga like toxin producing E coli (STEC) NOT DETECTED NOT DETECTED Final   Shigella/Enteroinvasive E coli (EIEC) NOT DETECTED NOT DETECTED Final   Cryptosporidium NOT DETECTED NOT DETECTED Final   Cyclospora cayetanensis NOT DETECTED NOT DETECTED Final  Entamoeba histolytica NOT DETECTED NOT DETECTED Final   Giardia lamblia NOT DETECTED NOT DETECTED Final   Adenovirus F40/41 NOT DETECTED NOT DETECTED Final   Astrovirus NOT DETECTED NOT DETECTED Final   Norovirus GI/GII NOT DETECTED NOT DETECTED Final   Rotavirus A NOT DETECTED NOT DETECTED Final   Sapovirus (I, II, IV, and V) NOT DETECTED NOT DETECTED Final    Comment: Performed at [facility], [address] [place], [place] [address]    Labs: CBC: Recent Labs  Lab 04/15/24 1619 04/16/24 0809 04/17/24 0539  WBC 0.3* 0.2* 0.6*  NEUTROABS 0.0*  --  0.2*  HGB 10.6* 8.0* 7.6*  HCT 31.1* 23.5* 21.8*  MCV 96.3 94.4 94.0  PLT 70* 56* 54*   Basic Metabolic Panel: Recent Labs  Lab 04/15/24 1619 04/16/24 0809 04/17/24 0539  NA 129* 132*  --   K 3.9 3.6  --   CL 93* 98  --   CO2 23 24  --   GLUCOSE 128* 102*  --   BUN 18 14  --   CREATININE 0.90 0.69 0.57*  CALCIUM  8.8* 8.1*  --   MG  --  2.0  --   PHOS  --  2.1*  --    Liver Function Tests: Recent Labs  Lab 04/15/24 1619 04/16/24 0809  AST 18 13*  ALT 19 13  ALKPHOS 58 44  BILITOT 1.9* 1.4*  PROT 6.9 5.7*  ALBUMIN 4.1 3.4*   CBG: No results for input(s): GLUCAP in the last 168 hours.  Discharge time spent: greater than 30 minutes.  Signed: Charlie Patterson, MD [facility] 04/17/2024 "

## 2024-04-17 NOTE — Evaluation (Signed)
 Occupational Therapy Evaluation Patient Details Name: Ruben Garcia MRN: [id number] DOB: 05-15-1945 Today's Date: 04/17/2024   History of Present Illness   LADALE SHERBURN is a 78 y.o. 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     Clinical Impressions Patient was seen for OT evaluation this date. Prior to hospital admission, patient was requiring intermittent A with ADLs due to fatigue as a side effect from chemo. Patient presents in bed with spouse present, spouse and patient eager to dc. Patient feels strongly that he can rehab at home and declines any follow up therapy after leaving hospital and also declines further OT while in hospital. OT educated on AE/DME to improve safety and efficiency with ADLs at home, patient somewhat receptive. He presents with poor activity tolerance but able to manage ADLs/transfers/mobility with supervision. Patients spouse is agreeable/willing to assist as much as needed upon discharge. No equipment needed, OT to sign off.       If plan is discharge home, recommend the following:   A little help with walking and/or transfers     Functional Status Assessment   Patient has had a recent decline in their functional status and demonstrates the ability to make significant improvements in function in a reasonable and predictable amount of time.     Equipment Recommendations   None recommended by OT     Recommendations for Other Services         Precautions/Restrictions   Precautions Precautions: None Recall of Precautions/Restrictions: Intact Restrictions Weight Bearing Restrictions Per Provider Order: No     Mobility Bed Mobility Overal bed mobility: Modified Independent             General bed mobility comments: HOB raised    Transfers Overall transfer level: Modified  independent Equipment used: Rolling walker (2 wheels)                      Balance Overall balance assessment: Mild deficits observed, not formally tested                                         ADL either performed or assessed with clinical judgement   ADL Overall ADL's : At baseline                                             Vision         Perception         Praxis         Pertinent Vitals/Pain       Extremity/Trunk Assessment Upper Extremity Assessment Upper Extremity Assessment: Overall WFL for tasks assessed           Communication Communication Communication: No apparent difficulties   Cognition                                             Cueing  General Comments          Exercises     Shoulder Instructions      Home Living Family/patient  expects to be discharged to:: Private residence Living Arrangements: Spouse/significant other Available Help at Discharge: Family;Available 24 hours/day Type of Home: House Home Access: Stairs to enter [employer] of Steps: 2+1 steps to enter home from carport   Home Layout: Two level;Able to live on main level with bedroom/bathroom     Bathroom Shower/Tub: Tub/shower unit   Bathroom Toilet: Standard Bathroom Accessibility: No   Home Equipment: [employer] (2 wheels);Cane - single point;Shower seat;Hand held shower head;Wheelchair - manual          Prior Functioning/Environment Prior Level of Function : Needs assist       Physical Assist : ADLs (physical)   ADLs (physical): Bathing;IADLs Mobility Comments: Ambulates with SPC v RW depending on the day. sleeps on couch ADLs Comments: Supervision with ADL's (PRN assist depending on the day); assist for IADL's; IND with medication management, driving PRN.    OT Problem List: Decreased activity tolerance   OT Treatment/Interventions:        OT Goals(Current  goals can be found in the care plan section)   Acute Rehab OT Goals Patient Stated Goal: to get out of here OT Goal Formulation: With patient Potential to Achieve Goals: Good   OT Frequency:       Co-evaluation              AM-PAC OT 6 Clicks Daily Activity     Outcome Measure Help from another person eating meals?: None Help from another person taking care of personal grooming?: None Help from another person toileting, which includes using toliet, bedpan, or urinal?: None Help from another person bathing (including washing, rinsing, drying)?: A Little Help from another person to put on and taking off regular upper body clothing?: None Help from another person to put on and taking off regular lower body clothing?: A Little 6 Click Score: 22   End of Session Equipment Utilized During Treatment: Rolling walker (2 wheels)  Activity Tolerance: Patient tolerated treatment well Patient left: in bed;with call bell/phone within reach;with bed alarm set;with family/visitor present                   Time: 9078-9055 OT Time Calculation (min): 23 min Charges:  OT General Charges $OT Visit: 1 Visit OT Evaluation $OT Eval Low Complexity: 1 Low  Rogers Clause, OT/L [facility], 04/17/2024

## 2024-04-17 NOTE — Evaluation (Signed)
 Physical Therapy Evaluation Patient Details Name: Ruben Garcia MRN: [id number] DOB: 04-03-1946 Today's Date: 04/17/2024  History of Present Illness  Ruben Garcia is a 78 y.o. 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  Clinical Impression  Pt received in supine with spouse at bedside, and is agreeable for PT eval. At baseline, pt is provided supervision for safety with ADL's, sometimes requiring physical assist from spouse depending on the day. Otherwise he is mod I for ambulation with AD and medication management. Spouse assists with IADL's and transportation.   Pt presents with decreased activity tolerance and mild balance deficits, resulting in impaired functional mobility from baseline. Due to deficits, pt required mod I for bed mobility, supervision for safety with transfers, and supervision to ambulate a total of 52ft both with and without AD. Able to perform static marching with adequate foot clearance indicating ability to safety navigate stairs at home.   Deficits limit the pt's ability to safely and independently perform ADL's, transfer, and ambulate. Pt will benefit from acute skilled PT services to address deficits for return to baseline function. Pt will benefit from post acute therapy services to address deficits for return to baseline function.  Encourage OOB mobility with nursing and mobility tech for meals and toileting for continued progress towards goals and maintenance of IND with functional mobility while hospitalized.          If plan is discharge home, recommend the following: Help with stairs or ramp for entrance;Assist for transportation;Assistance with cooking/housework   Can travel by private vehicle    Yes    Equipment Recommendations None recommended by PT     Functional Status Assessment Patient has had a recent  decline in their functional status and demonstrates the ability to make significant improvements in function in a reasonable and predictable amount of time.     Precautions / Restrictions Precautions Precautions: None Recall of Precautions/Restrictions: Intact Precaution/Restrictions Comments: protective precautions, SpO2 >/= 92% Restrictions Weight Bearing Restrictions Per Provider Order: No      Mobility  Bed Mobility               General bed mobility comments: mod I for sup<>sit transfers    Transfers                   General transfer comment: supervision for safety for STS at EOB with and without AD    Ambulation/Gait               General Gait Details: supervision to ambulate 65ft without AD and 76ft with RW, demo's slowed cadence and decreased step length/foot clearance bil. reports gait is near baseline     Balance Overall balance assessment: Modified Independent (mod I for standing balance with and without RW. able to perform static marching in place with UUE support, no LOB)                                           Pertinent Vitals/Pain Pain Assessment Pain Assessment: No/denies pain    Home Living Family/patient expects to be discharged to:: Private residence Living Arrangements: Spouse/significant other Available Help at Discharge: Family;Available 24 hours/day Type of Home: House Home Access: Stairs to enter   [employer] of Steps: 2+1 steps to enter  home from [facility] Layout: Two level;Able to live on main level with bedroom/bathroom Home Equipment: Rolling Walker (2 wheels);Cane - single point;Shower seat;Hand held shower head;Wheelchair - manual      Prior Function Prior Level of Function : Needs assist       Physical Assist : ADLs (physical)   ADLs (physical): Bathing;IADLs Mobility Comments: Ambulates with SPC v RW depending on the day. sleeps on couch ADLs Comments: Supervision with  ADL's (PRN assist depending on the day); assist for IADL's; IND with medication management, driving PRN.     Extremity/Trunk Assessment   Upper Extremity Assessment Upper Extremity Assessment: Overall WFL for tasks assessed    Lower Extremity Assessment Lower Extremity Assessment: Overall WFL for tasks assessed       Communication   Communication Communication: No apparent difficulties    Cognition Arousal: Alert Behavior During Therapy: WFL for tasks assessed/performed   PT - Cognitive impairments: No apparent impairments                         Following commands: Intact       Cueing Cueing Techniques: Verbal cues, Tactile cues, Visual cues     General Comments      Exercises Other Exercises Other Exercises: Pt and spouse edu re: PT role/POC, DC recs, energy conservation with DME and assist from family, use of RW vs SPC for energy conservation, WC for community if needed.   Assessment/Plan    PT Assessment Patient needs continued PT services  PT Problem List Decreased activity tolerance;Decreased mobility       PT Treatment Interventions DME instruction;Gait training;Stair training;Functional mobility training;Therapeutic activities;Therapeutic exercise;Balance training;Neuromuscular re-education    PT Goals (Current goals can be found in the Care Plan section)  Acute Rehab PT Goals Patient Stated Goal: go home PT Goal Formulation: With patient/family Time For Goal Achievement: 05/01/24 Potential to Achieve Goals: Good    Frequency Min 1X/week        AM-PAC PT 6 Clicks Mobility  Outcome Measure Help needed turning from your back to your side while in a flat bed without using bedrails?: None Help needed moving from lying on your back to sitting on the side of a flat bed without using bedrails?: None Help needed moving to and from a bed to a chair (including a wheelchair)?: A Little Help needed standing up from a chair using your arms  (e.g., wheelchair or bedside chair)?: A Little Help needed to walk in hospital room?: A Little Help needed climbing 3-5 steps with a railing? : A Little 6 Click Score: 20    End of Session Equipment Utilized During Treatment: Gait belt Activity Tolerance: Patient tolerated treatment well Patient left: in bed;with call bell/phone within reach;with family/visitor present (left in care of OT) Nurse Communication: Mobility status PT Visit Diagnosis: Unsteadiness on feet (R26.81)    Time: 9092-9076 PT Time Calculation (min) (ACUTE ONLY): 16 min   Charges:   PT Evaluation $PT Eval Moderate Complexity: 1 Mod PT Treatments $Therapeutic Activity: 8-22 mins PT General Charges $$ ACUTE PT VISIT: 1 Visit        Camie CHARLENA Kluver, PT, DPT 10:33 AM,04/17/2024 Physical Therapist - [place] [facility]

## 2024-04-18 ENCOUNTER — Encounter: Payer: Self-pay | Admitting: Internal Medicine

## 2024-04-18 ENCOUNTER — Telehealth: Payer: Self-pay

## 2024-04-18 NOTE — Transitions of Care (Post Inpatient/ED Visit) (Signed)
 "  04/18/2024  Name: Ruben Garcia MRN: [id number] DOB: 1946-03-15  Today's TOC FU Call Status: Today's TOC FU Call Status:: Successful TOC FU Call Completed TOC FU Call Complete Date: 04/18/24  Patient's Name and Date of Birth confirmed. Name, DOB  Transition Care Management Follow-up Telephone Call Date of Discharge: 04/17/24 Discharge Facility: [facility]) Type of Discharge: Inpatient Admission Primary Inpatient Discharge Diagnosis:: Sepsis, neutropenic fever How have you been since you were released from the hospital?: Better Any questions or concerns?: No  Items Reviewed: Did you receive and understand the discharge instructions provided?: Yes Medications obtained,verified, and reconciled?: Partial Review Completed Reason for Partial Mediation Review: I've changed out what I'm to stop and I have my new ones Any new allergies since your discharge?: No Dietary orders reviewed?: NA Do you have support at home?: Yes People in Home [RPT]: spouse Name of Support/Comfort Primary Source: Wife, Heron  Medications Reviewed Today: Patient reviewed only new medications and changes states he has to go Medications Reviewed Today     Reviewed by Eilleen Richerd GRADE, RN (Registered Nurse) on 04/18/24 at 1215  Med List Status: <None>   Medication Order Taking? Sig Documenting Provider Last Dose Status Informant  acetaminophen  (TYLENOL ) 325 MG tablet [id number]  Take 2 tablets (650 mg total) by mouth every 6 (six) hours as needed for mild pain (pain score 1-3), fever, headache or moderate pain (pain score 4-6) (or Fever >/= 101). Josette Ade, MD  Active Pharmacy Records           Med Note NIKKI, [place]   Dju Feb 27, 2024 10:49 AM) prn  acyclovir  (ZOVIRAX ) 400 MG tablet [id number]  Take 1 tablet (400 mg total) by mouth 2 (two) times daily. [to prevent shingles] Brahmanday, Govinda R, MD  Active Pharmacy Records  albuterol  (VENTOLIN  HFA) 108 [phone] Base) MCG/ACT  inhaler [id number]  Inhale 1-2 puffs into the lungs every 6 (six) hours as needed. Jhonny Calvin NOVAK, MD  Active Pharmacy Records  allopurinol  (ZYLOPRIM ) 300 MG tablet [id number]  Take 1 tablet (300 mg total) by mouth 2 (two) times daily. Borders, Fonda SAUNDERS, NP  Active Pharmacy Records  benzonatate  (TESSALON ) 100 MG capsule [id number]  Take 1-2 capsules (100-200 mg total) by mouth 3 (three) times daily as needed for cough. Jhonny Calvin NOVAK, MD  Active Pharmacy Records  chlorpheniramine-HYDROcodone  (TUSSIONEX) 10-8 MG/5ML [id number] Yes Take 5 mLs by mouth at bedtime as needed for cough. [provider]  Active Pharmacy Records  cyclobenzaprine  (FLEXERIL ) 10 MG tablet [id number]  Take 1 tablet (10 mg total) by mouth 3 (three) times daily as needed for muscle spasms. Gasper Nancyann BRAVO, MD  Active Pharmacy Records           Med Note [id number] [facility], [place]   Thu Nov 19, 2023  3:31 PM) prn  feeding supplement (ENSURE PLUS HIGH PROTEIN) LIQD [id number] Yes Take 237 mLs by mouth 2 (two) times daily between meals. Josette Ade, MD  Active   gabapentin  (NEURONTIN ) 300 MG capsule [id number]  Take 1 capsule (300 mg total) by mouth at bedtime. [name], MD  Active Pharmacy Records  lactulose  (CHRONULAC ) 10 GM/15ML solution [id number]  Take 15 mLs (10 g total) by mouth daily as needed for mild constipation. Brahmanday, Govinda R, MD  Active Pharmacy Records           Med Note NIKKI, [place]   Dju Feb 27, 2024 10:48 AM) prn  levofloxacin  (LEVAQUIN ) 750 MG tablet [id number] Yes  Take 1 tablet (750 mg total) by mouth every evening for 5 days. Josette Ade, MD  Active   ondansetron  (ZOFRAN ) 4 MG tablet [id number]  Take 1 tablet (4 mg total) by mouth every 8 (eight) hours as needed for nausea or vomiting. Josette Ade, MD  Active Pharmacy Records           Med Note NIKKI, [place]   Dju Feb 27, 2024 10:49 AM) prn  predniSONE  (DELTASONE ) 50 MG tablet [id number]  Take 2 tablets once day x 5 days. START  on day of your chemotherapy. Take in AM; with FOOD. [name], MD  Active Pharmacy Records  Med List Note Wendelyn Pulling, RN 11/25/23 1524): MR 12/25/23 UDS 11/25/23            Home Care and Equipment/Supplies: Were Home Health Services Ordered?: No Any new equipment or medical supplies ordered?: No    Follow up appointments reviewed: PCP Follow-up appointment confirmed?: Yes Follow-up Provider:  (Patient currently declining follow up) Specialist Hospital Follow-up appointment confirmed?: Yes Date of Specialist follow-up appointment?: 05/02/24 Follow-Up Specialty Provider:: [name], MD Do you need transportation to your follow-up appointment?: No Do you understand care options if your condition(s) worsen?: Yes-patient verbalized understanding  SDOH Interventions Today    Flowsheet Row Most Recent Value  SDOH Interventions   Food Insecurity Interventions Intervention Not Indicated  Housing Interventions Intervention Not Indicated  Transportation Interventions Intervention Not Indicated  Utilities Interventions Intervention Not Indicated   Education for self-mgmt of sepsis provided during this outreach regarding: -s/s of worsening condition and when to seek medical attention -importance of completing all post discharge hospital hospital follow up appts -adherence to med regimen [facility] TOC 30-day program enrollment reviewed and discussed with pt/caregiver. They have declined enrollment in 30 day TOC program due to: states has oncology follow ng closely   Richerd Fish, RN, BSN, CCM [facility], [facility] Management Coordinator Direct Dial: [phone]        "

## 2024-04-20 ENCOUNTER — Telehealth: Payer: Self-pay | Admitting: Internal Medicine

## 2024-04-20 LAB — CULTURE, BLOOD (ROUTINE X 2): Culture: NO GROWTH

## 2024-04-20 NOTE — Telephone Encounter (Signed)
 Spoke to patient improving after recent hospitalization.  Keep appointments-PET scan/MD appointment as planned- GB  FYI_

## 2024-04-25 ENCOUNTER — Ambulatory Visit
Admission: RE | Admit: 2024-04-25 | Discharge: 2024-04-25 | Disposition: A | Discharge status: Home / Self Care | Source: Ambulatory Visit | Attending: Internal Medicine | Admitting: Internal Medicine

## 2024-04-25 DIAGNOSIS — I2510 Atherosclerotic heart disease of native coronary artery without angina pectoris: Secondary | ICD-10-CM | POA: Insufficient documentation

## 2024-04-25 DIAGNOSIS — N289 Disorder of kidney and ureter, unspecified: Secondary | ICD-10-CM | POA: Insufficient documentation

## 2024-04-25 DIAGNOSIS — C8333 Diffuse large B-cell lymphoma, intra-abdominal lymph nodes: Secondary | ICD-10-CM | POA: Diagnosis present

## 2024-04-25 DIAGNOSIS — I700 Atherosclerosis of aorta: Secondary | ICD-10-CM | POA: Diagnosis not present

## 2024-04-25 LAB — GLUCOSE, CAPILLARY: Glucose-Capillary: 98 mg/dL (ref 70–99)

## 2024-04-25 MED ORDER — FLUDEOXYGLUCOSE F - 18 (FDG) INJECTION
9.2100 | Freq: Once | INTRAVENOUS | Status: AC | PRN
  Administered 2024-04-25: 9.21 via INTRAVENOUS

## 2024-05-02 ENCOUNTER — Inpatient Hospital Stay: Attending: Internal Medicine

## 2024-05-02 ENCOUNTER — Inpatient Hospital Stay: Admitting: Internal Medicine

## 2024-05-02 ENCOUNTER — Encounter: Payer: Self-pay | Admitting: Internal Medicine

## 2024-05-02 VITALS — BP 150/102 | HR 108 | Temp 98.6°F | Resp 18 | Ht 72.0 in | Wt 182.3 lb

## 2024-05-02 DIAGNOSIS — C8333 Diffuse large B-cell lymphoma, intra-abdominal lymph nodes: Secondary | ICD-10-CM | POA: Diagnosis not present

## 2024-05-02 LAB — CBC WITH DIFFERENTIAL (CANCER CENTER ONLY)
Abs Immature Granulocytes: 0.11 K/uL — ABNORMAL HIGH (ref 0.00–0.07)
Basophils Absolute: 0 K/uL (ref 0.0–0.1)
Basophils Relative: 1 %
Eosinophils Absolute: 0 K/uL (ref 0.0–0.5)
Eosinophils Relative: 0 %
HCT: 32.8 % — ABNORMAL LOW (ref 39.0–52.0)
Hemoglobin: 10.8 g/dL — ABNORMAL LOW (ref 13.0–17.0)
Immature Granulocytes: 2 %
Lymphocytes Relative: 18 %
Lymphs Abs: 1 K/uL (ref 0.7–4.0)
MCH: 32.4 pg (ref 26.0–34.0)
MCHC: 32.9 g/dL (ref 30.0–36.0)
MCV: 98.5 fL (ref 80.0–100.0)
Monocytes Absolute: 0.7 K/uL (ref 0.1–1.0)
Monocytes Relative: 13 %
Neutro Abs: 3.6 K/uL (ref 1.7–7.7)
Neutrophils Relative %: 66 %
Platelet Count: 276 K/uL (ref 150–400)
RBC: 3.33 MIL/uL — ABNORMAL LOW (ref 4.22–5.81)
RDW: 15.7 % — ABNORMAL HIGH (ref 11.5–15.5)
WBC Count: 5.4 K/uL (ref 4.0–10.5)
nRBC: 0 % (ref 0.0–0.2)

## 2024-05-02 LAB — CMP (CANCER CENTER ONLY)
ALT: 17 U/L (ref 0–44)
AST: 27 U/L (ref 15–41)
Albumin: 4 g/dL (ref 3.5–5.0)
Alkaline Phosphatase: 48 U/L (ref 38–126)
Anion gap: 9 (ref 5–15)
BUN: 13 mg/dL (ref 8–23)
CO2: 27 mmol/L (ref 22–32)
Calcium: 9 mg/dL (ref 8.9–10.3)
Chloride: 102 mmol/L (ref 98–111)
Creatinine: 0.83 mg/dL (ref 0.61–1.24)
GFR, Estimated: >60 mL/min
Glucose, Bld: 102 mg/dL — ABNORMAL HIGH (ref 70–99)
Potassium: 3.8 mmol/L (ref 3.5–5.1)
Sodium: 138 mmol/L (ref 135–145)
Total Bilirubin: 0.6 mg/dL (ref 0.0–1.2)
Total Protein: 6.5 g/dL (ref 6.5–8.1)

## 2024-05-02 LAB — LACTATE DEHYDROGENASE: LDH: 297 U/L — ABNORMAL HIGH (ref 105–235)

## 2024-05-02 NOTE — Assessment & Plan Note (Addendum)
#    DLBCL- NON-GCB sub type- STAGE IV- JULY 2025- 12/07/2023- PET scan-   Hypermetabolic right hilar and subcarinal lymphadenopathy consistent with lymphoma. Hypermetabolic disease involving the left iliac bone and adjacent iliopsoas and gluteus musculature; Hypermetabolic disease involving the bony anatomy of the pelvis and proximal femurs bilaterally, proximal left femoral diaphysis, left-sided ribs, lumbar spine, and sacral spine as well as parasacral soft tissues.   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   KI-67 IS ESSENTIALLY 100%. 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   hepatitis panel NEG. AUG 19th, 2025-  MUGA scan- left ventricular ejection fraction equals 53.5 %. 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 # s/p 6 cycles of Chemo- DEC 22nd, 2025-  Improvement in hypermetabolism along the right mainstem bronchus. More focal area of hypermetabolism corresponding to anterior right mainstem bronchus nodularity, favoring small volume residual lymphoma. [place] 4 [however bronchoscopy November 2025-negative for malignancy consistent with scarring]. No new or progressive disease.   # currently s/p cycle # 6 POLA R-CHP. Will also proceed with intrathecal IT prophylaxis total 5 [patient preference].  After finishing up intrathecal chemotherapy will plan clonseeq MRD testing.  # cough- CT scan October 2025/chest neutropenic fever -status post bronchoscopy [Dr.Dgyali]-clinically not suggestive of progressive disease but from scarring- will follow up with PET after 6 cycles. Nov 2025-  cytology-negative for cancer- improved-   # ID prophylaxis-recommend acyclovir  400 twice daily; Bactrim  Monday Wednesday Friday.  Tumor lysis prophylaxis-allopurinol  300 mg a day- stable.    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 continue Percocet prn.  stable.   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   #Incidental findings on Imaging  PET- CT , 2025:  Similar size of a 1.5 cm left renal lesion which measures greater than fluiddensity. Complex cyst is favored over a solid neoplasm, given lack of significant activity-  pre and post-contrast abdominal MRI- will plan down the line   # IV access: s/p Mediport placement.   PS- # DISPOSITION: # It chemo with IR on wed # follow in 3 weeks- MD;-labs- cbc/cmp; LDH; - D-2 IT chemo with IR-Dr.B  # I reviewed the blood work- with the patient in detail; also reviewed the imaging independently [as summarized above]; and with the patient in detail.

## 2024-05-02 NOTE — Progress Notes (Signed)
 PET 04/25/24. Was in the hospital for  neutropenic fever after chemo tx.

## 2024-05-02 NOTE — Progress Notes (Signed)
 [facility] CONSULT NOTE  Patient Care Team: Myrla Jon HERO, MD as PCP - General (Family Medicine) Verdene Gills, RN as Oncology Nurse Navigator [name], MD as Consulting Physician (Oncology)  CHIEF COMPLAINTS/PURPOSE OF CONSULTATION: DLBCL   Oncology History Overview Note    #  STAGE IV- JULY 2025- CT chest- Right hilar mass/mediastinal adenopathy.  MRI lumbar spine shows-L2 lesion.  MRI of the pelvis- Substantial metastatic burden to the bilateral iliac bones, right ischium, bilateral posterior acetabular walls, left pubic bone, central and right sacrum, bilateral femoral neck, and bilateral intertrochanteric region. In particular, tumor in the proximal femurs  Extraosseous extension of tumor from the dominant left iliac lesion, tracking along the left iliopsoas and upper pelvic sidewall; posteriorly along the gluteus minimus and gluteus medius, and even posteriorly along the lower paraspinal and medial left gluteus maximus musculature; Extraosseous extension of tumor from the right iliac lesion, tracking along the right iliopsoas and right gluteus minimus. Tumor along the attachment site of the right rectus femoris muscle, which could predispose to avulsion. Scattered small pelvic lymph nodes; Mildly prominent median lobe of prostate gland indents the bladder base.  12/07/2023- PET scan-   Hypermetabolic right hilar and subcarinal lymphadenopathy consistent with lymphoma. Hypermetabolic disease involving the left iliac bone and adjacent iliopsoas and gluteus musculature; Hypermetabolic disease involving the bony anatomy of the pelvis and proximal femurs bilaterally, proximal left femoral diaphysis, left-sided ribs, lumbar spine, and sacral spine as well as parasacral soft tissues.  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   KI-67 IS ESSENTIALLY 100%. TO EXCLUDE A SO-CALLED HIGH-GRADE B-CELL  LYMPHOMA/DOUBLE HIT LYMPHOMA FISH IS PENDING.   MUGA SCAN- 8/19-  hepatitis panel_NEG.     # AUG 14th, 2025-  rituximab  today-pending MUGA scan.   # AUG 21st, 2025- cycle #1 of chemotherapy POLA R-CHP    # Elevated AST ALT-question etiology.   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   DLBCL (diffuse large B cell lymphoma) (HCC)  12/01/2023 Initial Diagnosis   DLBCL (diffuse large B cell lymphoma) (HCC)   12/01/2023 Cancer Staging   Staging form: Hodgkin and Non-Hodgkin Lymphoma, AJCC 8th Edition - Clinical: Stage IV (Diffuse large B-cell lymphoma) - Signed by Lachlan Mckim R, MD on 12/01/2023   12/10/2023 -  Chemotherapy   Patient is on Treatment Plan : NON-HODGKINS LYMPHOMA POLA-R-CHP D1 X 6 cycles / Rituximab  D1 q21d X 2 cycles       HISTORY OF PRESENTING ILLNESS: Patient ambulating-in with a cane. accompanied by wife.   Ruben Garcia 79 y.o.  male pleasant patient with a diffuse large B-cell lymphoma stage IV is here to proceed with on Pola- RCHP is here for a follow up.  Discussed the use of AI scribe software for clinical note transcription with the patient, who gave verbal consent to proceed.  History of Present Illness   Ruben Garcia is a 79 year old male with stage IV diffuse large B-cell lymphoma who presents for post-treatment follow-up and review of recent imaging after hospitalization for neutropenic fever.  He completed six cycles of polatuzumab R-CHP chemotherapy, with cycles five and six administered in close succession, as well as intrathecal chemotherapy for CNS prophylaxis.  He was hospitalized for neutropenic fever following cycle six, which he attributes to cumulative chemotherapy effects. He is currently afebrile and without infectious  symptoms.  He describes persistent fatigue and weakness, most pronounced in the leg and hip, which has limited his activity and delayed return to baseline function. He has not yet engaged in occupational therapy for rehabilitation.  A residual pulmonary abnormality was evaluated by bronchoscopy, and the patient recalls being told that only scar tissue was found, without evidence of malignancy.  He experienced significant pain and technical difficulty during his last intrathecal procedure performed by a physician assistant, and has requested that future procedures be performed by a physician. He expresses apprehension regarding future procedures but is hopeful his concerns will be addressed.      Review of Systems  Constitutional:  Positive for malaise/fatigue and weight loss. Negative for chills, diaphoresis and fever.  HENT:  Negative for nosebleeds and sore throat.   Eyes:  Negative for double vision.  Respiratory:  Negative for hemoptysis, sputum production, shortness of breath and wheezing.   Cardiovascular:  Negative for chest pain, palpitations, orthopnea and leg swelling.  Gastrointestinal:  Negative for abdominal pain, blood in stool, constipation, diarrhea, heartburn, melena, nausea and vomiting.  Genitourinary:  Negative for dysuria, frequency and urgency.  Musculoskeletal:  Positive for back pain, joint pain and myalgias.  Skin: Negative.  Negative for itching and rash.  Neurological:  Negative for dizziness, tingling, focal weakness, weakness and headaches.  Endo/Heme/Allergies:  Does not bruise/bleed easily.  Psychiatric/Behavioral:  Negative for depression. The patient is not nervous/anxious and does not have insomnia.     MEDICAL HISTORY:  Past Medical History:  Diagnosis Date   Allergic rhinitis, cause unspecified    Anemia    Basal cell carcinoma of skin, site unspecified 04/28/2010   Nasal; [place].   BPH (benign prostatic hypertrophy)    Chronic cough    Colon  polyps    Diffuse large B-cell lymphoma of intra-abdominal lymph nodes (HCC) 2025   Dyspnea    Essential hypertension, benign    Hemorrhoids, internal    Incomplete bladder emptying    Narrowing of airway    Other abnormal glucose    Over weight    Personal history of colonic polyps    Personal history of other genital system and obstetric disorders(V13.29)    Pneumonia    Pure hypercholesterolemia    Unspecified disorder of kidney and ureter    Unspecified disorder of liver     SURGICAL HISTORY: Past Surgical History:  Procedure Laterality Date   BASAL CELL CARCINOMA EXCISION  2012   Facial        [place]   COLONOSCOPY  11/13/2010   single polyp; repeatin 5 years; [place]   COLONOSCOPY N/A 11/04/2021   Procedure: COLONOSCOPY;  Surgeon: Maryruth Ole DASEN, MD;  Location: [facility] ENDOSCOPY;  Service: Endoscopy;  Laterality: N/A;  REQUEST EARLY TIME   COLONOSCOPY WITH PROPOFOL  N/A 12/13/2015   Procedure: COLONOSCOPY WITH PROPOFOL ;  Surgeon: Lamar DASEN Viktoria, MD;  Location: [facility] ENDOSCOPY;  Service: Endoscopy;  Laterality: N/A;   ear surgery  05/2012   [name].  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  Benign.   HEMORRHOID SURGERY  1978   [name]   IR IMAGING GUIDED PORT INSERTION  12/04/2023   LAPAROSCOPIC CHOLECYSTECTOMY  1982   rotator cuff surgery Right 05/14/2016   VASECTOMY  1978   VIDEO BRONCHOSCOPY WITH ENDOBRONCHIAL ULTRASOUND Bilateral 03/15/2024   Procedure: BRONCHOSCOPY, WITH EBUS;  Surgeon: Isadora Hose, MD;  Location: [facility] ORS;  Service: Pulmonary;  Laterality: Bilateral;    SOCIAL HISTORY: Social History   Socioeconomic History   Marital status: Married    Spouse name: Heron   Number of children: 2   Years of education: 2 years college   Highest education level: Not on file  Occupational History   Occupation: retired    Comment: postal service in 2005  Tobacco Use   Smoking status: Never   Smokeless tobacco: Never  Vaping Use   Vaping status: Never Used  Substance and  Sexual Activity   Alcohol use: Not Currently    Alcohol/week: 5.0 standard drinks of alcohol    Types: 5 Cans of beer per week    Comment: weekends beer, 4- 6 total Cleotilde Mallory   Drug use: No   Sexual activity: Not Currently    Partners: Female  Other Topics Concern   Not on file  Social History Narrative   Always uses seat belts. Smoke alarm and carbon monoxide detector in the home. Guns in the home stored in locked cabinet.Caffeine use: Coffee 2 servings per day, moderate amount. Exercise: Moderate walking 2 - 4 miles daily, 2 - 3 days per week.    Marital status:  Married x 47 years, happily.      Children:  2 children; 1 grandchild (20).      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      Tobacco: never      Alcohol:  Weekends; beer 4-6 per weekend.      Exercise:  Walking every other day 2.5 miles three times per day.      Seatbelt: 100%; no texting      Guns: secured guns.      Living Will: completed in 2014.  FULL CODE but no prolonged measures.        ADLs: independent with ADLs; no assistant devices   Social Drivers of Health   Tobacco Use: Low Risk (05/02/2024)   Patient History    Smoking Tobacco Use: Never    Smokeless Tobacco Use: Never    Passive Exposure: Not on file  Financial Resource Strain: Not on file  Food Insecurity: No Food Insecurity (04/18/2024)   Epic    Worried About Programme Researcher, Broadcasting/film/video in the Last Year: Never true    Ran Out of Food in the Last Year: Never true  Transportation Needs: No Transportation Needs (04/18/2024)   Epic    Lack of Transportation (Medical): No    Lack of Transportation (Non-Medical): No  Physical Activity: Not on file  Stress: Not on file  Social Connections: Moderately Isolated (04/16/2024)   Social Connection and Isolation Panel    Frequency of Communication with Friends and Family: More than three times a week    Frequency of Social Gatherings with Friends and Family: Never    Attends Religious Services:  Never    Database Administrator or Organizations: No    Attends Banker Meetings: Never    Marital Status: Married  Catering Manager Violence: Not At Risk (04/18/2024)   Epic    Fear of Current or Ex-Partner: No    Emotionally Abused: No    Physically Abused: No    Sexually Abused: No  Depression (PHQ2-9): Low Risk (05/02/2024)   Depression (PHQ2-9)    PHQ-2 Score: 0  Alcohol Screen: Low Risk (09/12/2022)   Alcohol Screen    Last Alcohol Screening Score (AUDIT): 5  Housing: Unknown (04/18/2024)  Epic    Unable to Pay for Housing in the Last Year: No    Number of Times Moved in the Last Year: Not on file    Homeless in the Last Year: No  Utilities: Not At Risk (04/18/2024)   Epic    Threatened with loss of utilities: No  Health Literacy: Not on file    FAMILY HISTORY: Family History  Problem Relation Age of Onset   Heart disease Mother        first MI in 79s   Diabetes Mother    Hyperlipidemia Mother    Arthritis Mother        rheumatoid   Cancer Father        lung   Diabetes Father    Arthritis Sister    Hyperlipidemia Sister    Hyperlipidemia Sister    Hypertension Sister    Hyperlipidemia Sister    Kidney disease Neg Hx    Prostate cancer Neg Hx    Colon cancer Neg Hx     ALLERGIES:  is allergic to codeine.  MEDICATIONS:  Current Outpatient Medications  Medication Sig Dispense Refill   acetaminophen  (TYLENOL ) 325 MG tablet Take 2 tablets (650 mg total) by mouth every 6 (six) hours as needed for mild pain (pain score 1-3), fever, headache or moderate pain (pain score 4-6) (or Fever >/= 101).     acyclovir  (ZOVIRAX ) 400 MG tablet Take 1 tablet (400 mg total) by mouth 2 (two) times daily. [to prevent shingles] 60 tablet 3   albuterol  (VENTOLIN  HFA) 108 (90 Base) MCG/ACT inhaler Inhale 1-2 puffs into the lungs every 6 (six) hours as needed. 18 g 0   allopurinol  (ZYLOPRIM ) 300 MG tablet Take 1 tablet (300 mg total) by mouth 2 (two) times daily. 120  tablet 0   benzonatate  (TESSALON ) 100 MG capsule Take 1-2 capsules (100-200 mg total) by mouth 3 (three) times daily as needed for cough. 90 capsule 0   chlorpheniramine-HYDROcodone  (TUSSIONEX) 10-8 MG/5ML Take 5 mLs by mouth at bedtime as needed for cough.     cyclobenzaprine  (FLEXERIL ) 10 MG tablet Take 1 tablet (10 mg total) by mouth 3 (three) times daily as needed for muscle spasms. 30 tablet 2   feeding supplement (ENSURE PLUS HIGH PROTEIN) LIQD Take 237 mLs by mouth 2 (two) times daily between meals. (Patient taking differently: Take 237 mLs by mouth daily.) 14220 mL 0   gabapentin  (NEURONTIN ) 300 MG capsule Take 1 capsule (300 mg total) by mouth at bedtime. 90 capsule 0   lactulose  (CHRONULAC ) 10 GM/15ML solution Take 15 mLs (10 g total) by mouth daily as needed for mild constipation. 236 mL 0   ondansetron  (ZOFRAN ) 4 MG tablet Take 1 tablet (4 mg total) by mouth every 8 (eight) hours as needed for nausea or vomiting. 30 tablet 0   predniSONE  (DELTASONE ) 50 MG tablet Take 2 tablets once day x 5 days. START on day of your chemotherapy. Take in AM; with FOOD. 10 tablet 5   No current facility-administered medications for this visit.    PHYSICAL EXAMINATION:   Vitals:   05/02/24 1431 05/02/24 1456  BP: (!) 132/109 (!) 150/102  Pulse: (!) 109 (!) 108  Resp: 18   Temp: 98.6 F (37 C)   SpO2: 100% 99%    Filed Weights   05/02/24 1431  Weight: 182 lb 4.8 oz (82.7 kg)    Patient is wheezing bilaterally.  Physical Exam Vitals and nursing note reviewed.  HENT:  Head: Normocephalic and atraumatic.     Mouth/Throat:     Pharynx: Oropharynx is clear.  Eyes:     Extraocular Movements: Extraocular movements intact.     Pupils: Pupils are equal, round, and reactive to light.  Cardiovascular:     Rate and Rhythm: Normal rate and regular rhythm.  Pulmonary:     Comments: Decreased breath sounds bilaterally.  Abdominal:     Palpations: Abdomen is soft.  Musculoskeletal:         General: Normal range of motion.     Cervical back: Normal range of motion.  Skin:    General: Skin is warm.  Neurological:     General: No focal deficit present.     Mental Status: He is alert and oriented to person, place, and time.  Psychiatric:        Behavior: Behavior normal.        Judgment: Judgment normal.     LABORATORY DATA:  I have reviewed the data as listed Lab Results  Component Value Date   WBC 5.4 05/02/2024   HGB 10.8 (L) 05/02/2024   HCT 32.8 (L) 05/02/2024   MCV 98.5 05/02/2024   PLT 276 05/02/2024   Recent Labs    11/19/23 1347 11/20/23 0511 04/15/24 1619 04/16/24 0809 04/17/24 0539 05/02/24 1434  NA  --    < > 129* 132*  --  138  K  --    < > 3.9 3.6  --  3.8  CL  --    < > 93* 98  --  102  CO2  --    < > 23 24  --  27  GLUCOSE  --    < > 128* 102*  --  102*  BUN  --    < > 18 14  --  13  CREATININE  --    < > 0.90 0.69 0.57* 0.83  CALCIUM   --    < > 8.8* 8.1*  --  9.0  GFRNONAA  --    < > >60 >60 >60 >60  PROT  --    < > 6.9 5.7*  --  6.5  ALBUMIN  --    < > 4.1 3.4*  --  4.0  AST  --    < > 18 13*  --  27  ALT  --    < > 19 13  --  17  ALKPHOS  --    < > 58 44  --  48  BILITOT 2.5*   < > 1.9* 1.4*  --  0.6  BILIDIR 0.3*  --   --   --   --   --   IBILI 2.2*  --   --   --   --   --    < > = values in this interval not displayed.    RADIOGRAPHIC STUDIES: I have personally reviewed the radiological images as listed and agreed with the findings in the report. NM PET Image Restage (PS) Skull Base to Thigh (F-18 FDG) Result Date: 04/30/2024 EXAM: PET AND CT SKULL BASE TO MID THIGH 04/25/2024 09:49:46 AM TECHNIQUE: RADIOPHARMACEUTICAL: 9.21 mCi F-18 FDG Uptake time 60 minutes. Glucose level 98 mg/dl. Blood pool SUV 1.7; liver activity 3.2. PET imaging was acquired from the base of the skull to the mid thighs. Non-contrast enhanced computed tomography was obtained for attenuation correction and anatomic localization. COMPARISON: PET of 01/20/2024.  Diagnostic CTs of 02/26/2024. CLINICAL HISTORY: lymphoma- post chemo  lymphoma- post chemo FINDINGS: HEAD AND NECK: Bilateral carotid atherosclerosis. No hypermetabolic cervical lymph nodes are identified. CHEST: The hypermetabolism about the right mainstem bronchus is primarily resolved. There is a focus of residual activity about the anterior wall of the proximal most right mainstem bronchus which corresponds to a soft tissue nodule. Example 6 mm and SUV 6.5. The diffuse activity along the course of the right mainstem bronchus measured SUV 7.6 previously. The soft tissue thickening is improved, especially posteriorly. Minimal anterior soft tissue thickening remains included on image 59 / 6. Otherwise, no thoracic nodal or pulmonary parenchymal hypermetabolism identified. There are no hypermetabolic mediastinal, hilar or axillary lymph nodes. No hypermetabolic pulmonary activity or suspicious nodularity. Trace bilateral pleural fluid or thickening, similar and likely physiologic. Aortic and coronary artery calcifications. Right port-a-cath tip at right atrium. ABDOMEN AND PELVIS: Cholecystectomy. Interpolar left renal lesion measures 1.7 cm and greater than fluid density on image 109 / 6. Similar in size to the prior exam when re-measured in a similar fashion. Fat-containing left inguinal hernia. Abdominal aortic atherosclerosis. There is no hypermetabolic activity within the liver, adrenal glands, spleen or pancreas. There is no hypermetabolic nodal activity in the abdomen or pelvis. No metabolically active intraperitoneal mass. Physiologic activity within the gastrointestinal and genitourinary systems. BONES AND SOFT TISSUE: Again identified are diffuse scattered sclerotic lesions, including within the left acetabulum and right L2 vertebral body are similar and likely bone islands. There is no hypermetabolic activity to suggest osseous metastatic disease. No metabolically active aggressive osseous lesion.  IMPRESSION: 1. Improvement in hypermetabolism along the right mainstem bronchus. More focal area of hypermetabolism corresponding to anterior right mainstem bronchus nodularity, favoring small volume residual lymphoma. Dauville 4. 2. No new or progressive disease. 3. Indeterminate left renal lesion, similar in size but favored to represent a minimally complex cyst. 4. Aortic atherosclerosis (icd10-i70.0). Coronary artery atherosclerosis. Electronically signed by: Rockey Kilts MD 04/30/2024 11:47 AM EST RP Workstation: [id number]   DG Chest Port 1 View Result Date: 04/15/2024 CLINICAL DATA:  Possible sepsis lung and bone cancer EXAM: PORTABLE CHEST 1 VIEW COMPARISON:  03/15/2024 FINDINGS: Right-sided central venous port tip over the SVC. No focal opacity, pleural effusion or pneumothorax. Stable cardiomediastinal silhouette with aortic atherosclerosis. IMPRESSION: No active disease. Electronically Signed   By: Luke Bun M.D.   On: 04/15/2024 17:24   DG FL LUMBAR PUNCTURE W/CHEMO INJECT Result Date: 04/08/2024 CLINICAL DATA:  79 year old male with history of diffuse large B-cell lymphoma, currently undergoing chemotherapy. IR was requested for lumbar puncture with intrathecal methotrexate  injection. EXAM: FLUOROSCOPICALLY GUIDED LUMBAR PUNCTURE FOR INTRATHECAL CHEMOTHERAPY FLUOROSCOPY: Radiation Exposure Index (as provided by the fluoroscopic device): 7.5 mGy Kerma PROCEDURE: Informed consent was obtained from the patient prior to the procedure, including potential complications of headache, allergy, and pain. With the patient prone, the lower back was prepped with Betadine. 1% Lidocaine  was used for local anesthesia. Lumbar puncture was performed at the L4-L5 level using a 22-gauge needle with return of clearCSF. 5 mL of methotrexate  was injected into the subarachnoid space. The patient tolerated the procedure well without apparent complication. IMPRESSION: Successful lumbar puncture with intrathecal  injection of chemotherapy. No immediate complication. Procedure performed by: Carlin Griffon, PA-C, and supervised by: Harrietta Sherry, MD Electronically Signed   By: Harrietta Sherry M.D.   On: 04/08/2024 12:29     DLBCL (diffuse large B cell lymphoma) (HCC) #  DLBCL- NON-GCB sub type- STAGE IV- JULY 2025- 12/07/2023- PET scan-   Hypermetabolic right hilar and  subcarinal lymphadenopathy consistent with lymphoma. Hypermetabolic disease involving the left iliac bone and adjacent iliopsoas and gluteus musculature; Hypermetabolic disease involving the bony anatomy of the pelvis and proximal femurs bilaterally, proximal left femoral diaphysis, left-sided ribs, lumbar spine, and sacral spine as well as parasacral soft tissues.   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   KI-67 IS ESSENTIALLY 100%. 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   hepatitis panel NEG. AUG 19th, 2025-  MUGA scan- left ventricular ejection fraction equals 53.5 %. 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 # s/p 6 cycles of Chemo- DEC 22nd, 2025-  Improvement in hypermetabolism along the right mainstem bronchus. More focal area of hypermetabolism corresponding to anterior right mainstem bronchus nodularity, favoring small volume residual lymphoma. Dauville 4 [however bronchoscopy November 2025-negative for malignancy consistent with scarring]. No new or progressive disease.   # currently s/p cycle # 6 POLA R-CHP. Will also proceed with intrathecal IT prophylaxis total 5 [patient preference].  After finishing up intrathecal  chemotherapy will plan clonseeq MRD testing.  # cough- CT scan October 2025/chest neutropenic fever -status post bronchoscopy [Dr.Dgyali]-clinically not suggestive of progressive disease but from scarring- will follow up with PET after 6 cycles. Nov 2025- cytology-negative for cancer- improved-   # ID prophylaxis-recommend acyclovir  400 twice daily; Bactrim  Monday Wednesday Friday.  Tumor lysis prophylaxis-allopurinol  300 mg a day- stable.    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 continue Percocet prn.  stable.   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   #Incidental findings on Imaging  PET- CT , 2025:  Similar size of a 1.5 cm left renal lesion which measures greater than fluiddensity. Complex cyst is favored over a solid neoplasm, given lack of significant activity-  pre and post-contrast abdominal MRI- will plan down the line   # IV access: s/p Mediport placement.   PS- # DISPOSITION: # It chemo with IR on wed # follow in 3 weeks- MD;-labs- cbc/cmp; LDH; - D-2 IT chemo with IR-Dr.B  # I reviewed the blood work- with the patient in detail; also reviewed the imaging independently [as summarized above]; and with the patient in detail.         Cindy JONELLE Joe, MD 05/02/2024 3:47 PM

## 2024-05-03 ENCOUNTER — Telehealth: Payer: Self-pay

## 2024-05-03 ENCOUNTER — Other Ambulatory Visit: Payer: Self-pay

## 2024-05-03 ENCOUNTER — Ambulatory Visit: Admission: RE | Admit: 2024-05-03 | Source: Ambulatory Visit

## 2024-05-03 MED ORDER — DIPHENHYDRAMINE HCL 50 MG/ML IJ SOLN: 50.0000 mg | Freq: Once | INTRAMUSCULAR | Status: DC | PRN

## 2024-05-03 MED ORDER — EPINEPHRINE 0.3 MG/0.3ML IJ SOAJ: 0.3000 mg | Freq: Once | INTRAMUSCULAR | Status: DC | PRN

## 2024-05-03 MED ORDER — ALBUTEROL SULFATE HFA 108 (90 BASE) MCG/ACT IN AERS: 2.0000 | INHALATION_SPRAY | Freq: Once | RESPIRATORY_TRACT | Status: DC | PRN

## 2024-05-03 MED ORDER — FAMOTIDINE IN NACL 20-0.9 MG/50ML-% IV SOLN: 20.0000 mg | Freq: Once | INTRAVENOUS | Status: DC | PRN

## 2024-05-03 MED ORDER — METHYLPREDNISOLONE SODIUM SUCC 125 MG IJ SOLR: 125.0000 mg | Freq: Once | INTRAMUSCULAR | Status: DC | PRN

## 2024-05-03 MED ORDER — SODIUM CHLORIDE (PF) 0.9 % IJ SOLN
Freq: Once | INTRAMUSCULAR | Status: AC
  Filled 2024-05-03: qty 0.48

## 2024-05-03 MED ORDER — SODIUM CHLORIDE 0.9 % IV SOLN: Freq: Once | INTRAVENOUS | Status: AC | PRN

## 2024-05-03 NOTE — Telephone Encounter (Signed)
 Patient for IR Methotrexate  Inj on Wed 05/04/24, I called and spoke with the patient on the phone and gave pre-procedure instructions. Pt was made aware to be here at 10:30a and check in at the [facility] entrance. Pt stated understanding.  Called 05/02/24

## 2024-05-04 ENCOUNTER — Other Ambulatory Visit: Payer: Self-pay | Admitting: Pharmacy Technician

## 2024-05-04 ENCOUNTER — Ambulatory Visit
Admission: RE | Admit: 2024-05-04 | Discharge: 2024-05-04 | Disposition: A | Discharge status: Home / Self Care | Source: Ambulatory Visit | Attending: Internal Medicine | Admitting: Internal Medicine

## 2024-05-04 ENCOUNTER — Other Ambulatory Visit: Payer: Self-pay | Admitting: Radiology

## 2024-05-04 DIAGNOSIS — C8333 Diffuse large B-cell lymphoma, intra-abdominal lymph nodes: Secondary | ICD-10-CM | POA: Diagnosis present

## 2024-05-04 MED ORDER — SODIUM CHLORIDE 0.9 % IV SOLN: Freq: Once | INTRAVENOUS | Status: DC | PRN

## 2024-05-04 MED ORDER — EPINEPHRINE 0.3 MG/0.3ML IJ SOAJ: 0.3000 mg | Freq: Once | INTRAMUSCULAR | Status: DC | PRN

## 2024-05-04 MED ORDER — METHYLPREDNISOLONE SODIUM SUCC 125 MG IJ SOLR: 125.0000 mg | Freq: Once | INTRAMUSCULAR | Status: DC | PRN

## 2024-05-04 MED ORDER — DIPHENHYDRAMINE HCL 50 MG/ML IJ SOLN: 50.0000 mg | Freq: Once | INTRAMUSCULAR | Status: DC | PRN

## 2024-05-04 MED ORDER — FAMOTIDINE IN NACL 20-0.9 MG/50ML-% IV SOLN: 20.0000 mg | Freq: Once | INTRAVENOUS | Status: DC | PRN

## 2024-05-04 MED ORDER — SODIUM CHLORIDE (PF) 0.9 % IJ SOLN
Freq: Once | INTRAMUSCULAR | Status: AC
  Filled 2024-05-04: qty 0.48

## 2024-05-04 MED ORDER — LIDOCAINE 1 % OPTIME INJ - NO CHARGE
5.0000 mL | Freq: Once | INTRAMUSCULAR | Status: AC
  Administered 2024-05-04: 2 mL

## 2024-05-04 MED ORDER — LIDOCAINE HCL (PF) 1 % IJ SOLN
INTRAMUSCULAR | Status: AC
  Filled 2024-05-04: qty 30

## 2024-05-07 ENCOUNTER — Other Ambulatory Visit: Payer: Self-pay

## 2024-05-07 ENCOUNTER — Emergency Department
Admission: EM | Admit: 2024-05-07 | Discharge: 2024-05-07 | Disposition: A | Discharge status: Home / Self Care | Attending: Emergency Medicine | Admitting: Emergency Medicine

## 2024-05-07 ENCOUNTER — Emergency Department

## 2024-05-07 DIAGNOSIS — R200 Anesthesia of skin: Secondary | ICD-10-CM | POA: Diagnosis present

## 2024-05-07 DIAGNOSIS — I10 Essential (primary) hypertension: Secondary | ICD-10-CM | POA: Insufficient documentation

## 2024-05-07 DIAGNOSIS — G510 Bell's palsy: Secondary | ICD-10-CM | POA: Insufficient documentation

## 2024-05-07 DIAGNOSIS — Z8572 Personal history of non-Hodgkin lymphomas: Secondary | ICD-10-CM | POA: Diagnosis not present

## 2024-05-07 DIAGNOSIS — Z85828 Personal history of other malignant neoplasm of skin: Secondary | ICD-10-CM | POA: Diagnosis not present

## 2024-05-07 LAB — COMPREHENSIVE METABOLIC PANEL WITH GFR
ALT: 26 U/L (ref 0–44)
AST: 36 U/L (ref 15–41)
Albumin: 3.8 g/dL (ref 3.5–5.0)
Alkaline Phosphatase: 46 U/L (ref 38–126)
Anion gap: 10 (ref 5–15)
BUN: 8 mg/dL (ref 8–23)
CO2: 23 mmol/L (ref 22–32)
Calcium: 8.7 mg/dL — ABNORMAL LOW (ref 8.9–10.3)
Chloride: 106 mmol/L (ref 98–111)
Creatinine, Ser: 0.81 mg/dL (ref 0.61–1.24)
GFR, Estimated: >60 mL/min
Glucose, Bld: 95 mg/dL (ref 70–99)
Potassium: 3.9 mmol/L (ref 3.5–5.1)
Sodium: 139 mmol/L (ref 135–145)
Total Bilirubin: 0.8 mg/dL (ref 0.0–1.2)
Total Protein: 6.2 g/dL — ABNORMAL LOW (ref 6.5–8.1)

## 2024-05-07 LAB — CBC WITH DIFFERENTIAL/PLATELET
Abs Immature Granulocytes: 0.05 K/uL (ref 0.00–0.07)
Basophils Absolute: 0 K/uL (ref 0.0–0.1)
Basophils Relative: 1 %
Eosinophils Absolute: 0 K/uL (ref 0.0–0.5)
Eosinophils Relative: 1 %
HCT: 33 % — ABNORMAL LOW (ref 39.0–52.0)
Hemoglobin: 11.2 g/dL — ABNORMAL LOW (ref 13.0–17.0)
Immature Granulocytes: 1 %
Lymphocytes Relative: 19 %
Lymphs Abs: 0.9 K/uL (ref 0.7–4.0)
MCH: 33.6 pg (ref 26.0–34.0)
MCHC: 33.9 g/dL (ref 30.0–36.0)
MCV: 99.1 fL (ref 80.0–100.0)
Monocytes Absolute: 0.2 K/uL (ref 0.1–1.0)
Monocytes Relative: 3 %
Neutro Abs: 3.4 K/uL (ref 1.7–7.7)
Neutrophils Relative %: 75 %
Platelets: 195 K/uL (ref 150–400)
RBC: 3.33 MIL/uL — ABNORMAL LOW (ref 4.22–5.81)
RDW: 14.9 % (ref 11.5–15.5)
WBC: 4.5 K/uL (ref 4.0–10.5)
nRBC: 0 % (ref 0.0–0.2)

## 2024-05-07 MED ORDER — GADOBUTROL 1 MMOL/ML IV SOLN
8.0000 mL | Freq: Once | INTRAVENOUS | Status: AC | PRN
  Administered 2024-05-07: 8 mL via INTRAVENOUS

## 2024-05-07 MED ORDER — PREDNISONE 20 MG PO TABS
60.0000 mg | ORAL_TABLET | Freq: Once | ORAL | Status: AC
  Administered 2024-05-07: 60 mg via ORAL
  Filled 2024-05-07: qty 3

## 2024-05-07 MED ORDER — PREDNISONE 10 MG PO TABS: ORAL_TABLET | ORAL | 0 refills | Status: DC

## 2024-05-07 NOTE — ED Provider Notes (Signed)
 "  [facility] Provider Note    Event Date/Time   First MD Initiated Contact with Patient 05/07/24 1735     (approximate)   History   Chief Complaint: facial numbness   HPI  Ruben Garcia is a 79 y.o. 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  Noticed that he had trouble drinking from a straw.  Has observed facial droop.  No vision changes or headache, no trauma.  No drift or motor weakness, no change in balance or coronation.  No fever        Past Medical History:  Diagnosis Date   Allergic rhinitis, cause unspecified    Anemia    Basal cell carcinoma of skin, site unspecified 04/28/2010   Nasal; [place].   BPH (benign prostatic hypertrophy)    Chronic cough    Colon polyps    Diffuse large B-cell lymphoma of intra-abdominal lymph nodes (HCC) 2025   Dyspnea    Essential hypertension, benign    Hemorrhoids, internal    Incomplete bladder emptying    Narrowing of airway    Other abnormal glucose    Over weight    Personal history of colonic polyps    Personal history of other genital system and obstetric disorders(V13.29)    Pneumonia    Pure hypercholesterolemia    Unspecified disorder of kidney and ureter    Unspecified disorder of liver     Current Outpatient Rx   [START ON 05/08/2024] Order #: [id number] Class: Normal   Order #: [id number] Class: OTC   Order #: [id number] Class: Normal   Order #: [id number] Class: Normal   Order #: [id number] Class: Normal   Order #: [id number] Class: Normal   Order #: [id number] Class: Historical Med   Order #: [id number] Class: Normal   Order #: [id number] Class: Normal   Order #: [id number] Class: Normal   Order #: [id number] Class: Normal   Order #: [id number] Class: Normal   Order #: [id number] Class: Normal    Past Surgical History:  Procedure Laterality Date   BASAL CELL CARCINOMA  EXCISION  2012   Facial        [place]   COLONOSCOPY  11/13/2010   single polyp; repeatin 5 years; [place]   COLONOSCOPY N/A 11/04/2021   Procedure: COLONOSCOPY;  Surgeon: Maryruth Ole DASEN, MD;  Location: [facility] ENDOSCOPY;  Service: Endoscopy;  Laterality: N/A;  REQUEST EARLY TIME   COLONOSCOPY WITH PROPOFOL  N/A 12/13/2015   Procedure: COLONOSCOPY WITH PROPOFOL ;  Surgeon: Lamar DASEN Viktoria, MD;  Location: [facility] ENDOSCOPY;  Service: Endoscopy;  Laterality: N/A;   ear surgery  05/2012   [name].  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  Benign.   HEMORRHOID SURGERY  1978   [name]   IR IMAGING GUIDED PORT INSERTION  12/04/2023   LAPAROSCOPIC CHOLECYSTECTOMY  1982   rotator cuff surgery Right 05/14/2016   VASECTOMY  1978   VIDEO BRONCHOSCOPY WITH ENDOBRONCHIAL ULTRASOUND Bilateral 03/15/2024   Procedure: BRONCHOSCOPY, WITH EBUS;  Surgeon: Isadora Hose, MD;  Location: [facility] ORS;  Service: Pulmonary;  Laterality: Bilateral;    Physical Exam   Triage Vital Signs: ED Triage Vitals  Encounter Vitals Group     BP 05/07/24 1711 (!) 146/99     Girls Systolic BP Percentile --      Girls Diastolic BP Percentile --      Boys Systolic BP Percentile --      Boys Diastolic BP Percentile --  Pulse Rate 05/07/24 1711 (!) 107     Resp 05/07/24 1711 17     Temp 05/07/24 1711 97.9 F (36.6 C)     Temp Source 05/07/24 1711 Oral     SpO2 05/07/24 1711 100 %     Weight 05/07/24 1712 182 lb 1.6 oz (82.6 kg)     Height 05/07/24 1712 6' (1.829 m)     Head Circumference --      Peak Flow --      Pain Score 05/07/24 1712 0     Pain Loc --      Pain Education --      Exclude from Growth Chart --     Most recent vital signs: Vitals:   05/07/24 2100 05/07/24 2109  BP: (!) 137/108 (!) 152/90  Pulse: 85 86  Resp: 18 20  Temp: 97.9 F (36.6 C) 98 F (36.7 C)  SpO2: 100% 100%    General: Awake, no distress.  CV:  Good peripheral perfusion.  Regular rate rhythm Resp:  Normal effort.  Clear lungs Abd:  No  distention.  Other:  Substantial paralysis of the right side of the face including the forehead.  He is able to fully close the right eye weakly but spontaneous blink closure on the right is poor   ED Results / Procedures / Treatments   Labs (all labs ordered are listed, but only abnormal results are displayed) Labs Reviewed  CBC WITH DIFFERENTIAL/PLATELET - Abnormal; Notable for the following components:      Result Value   RBC 3.33 (*)    Hemoglobin 11.2 (*)    HCT 33.0 (*)    All other components within normal limits  COMPREHENSIVE METABOLIC PANEL WITH GFR - Abnormal; Notable for the following components:   Calcium  8.7 (*)    Total Protein 6.2 (*)    All other components within normal limits     EKG Interpreted by me Sinus tachycardia rate 109.  Normal axis and intervals.  Normal QRS ST segments and T waves   RADIOLOGY MRI brain interpreted by me, no obvious infarct.  Radiology report reviewed   PROCEDURES:  Procedures   MEDICATIONS ORDERED IN ED: Medications  gadobutrol  (GADAVIST ) 1 MMOL/ML injection 8 mL (8 mLs Intravenous Contrast Given 05/07/24 1925)  predniSONE  (DELTASONE ) tablet 60 mg (60 mg Oral Given 05/07/24 2108)     IMPRESSION / MDM / ASSESSMENT AND PLAN / ED COURSE  I reviewed the triage vital signs and the nursing notes.  DDx: Bell's palsy, electrolyte derangement, subacute stroke, chemotherapy-induced neuropathy, brain metastasis  Patient's presentation is most consistent with acute presentation with potential threat to life or bodily function.  Patient presents with right facial weakness, pattern consistent with a Bell's palsy facial nerve dysfunction.  He is elderly with significant comorbidities, will obtain MRI   Clinical Course as of 05/07/24 2204  Sat May 07, 2024  8062 Received signout, patient with 48 hours of right facial numbness possible Bell's palsy, awaiting MRI brain with and without results [HD]  2052 Will consult  hematology/oncology [HD]  2054 Patient taught about eye taping at night.  [HD]  2054 On ear exam, no lesions.  Patient denies any tick bites [HD]  2057 Case discussed with Dr. Babara.  Genna to initiate prolonged prednisone  course and he should follow-up with his oncologist on Monday [HD]    Clinical Course User Index [HD] Nicholaus Rolland BRAVO, MD     FINAL CLINICAL IMPRESSION(S) / ED DIAGNOSES  Final diagnoses:  Bell's palsy     Rx / DC Orders   ED Discharge Orders          Ordered    predniSONE  (DELTASONE ) 10 MG tablet  Q breakfast        05/07/24 2101             Note:  This document was prepared using Dragon voice recognition software and may include unintentional dictation errors.   Viviann Pastor, MD 05/07/24 2204  "

## 2024-05-07 NOTE — ED Notes (Signed)
 Pt would prefer is port to be accessed for blood work.

## 2024-05-07 NOTE — ED Triage Notes (Signed)
 Pt comes in via pov with complaints of right sided facial numbness that started on Thursday. Pt had a chemotherapy treatment on Wednesday, after speaking with his provider he was told to come in to be checked out. Pt is alert and oriented x4. PT with no extremity weakness or deficits at this time.

## 2024-05-07 NOTE — Discharge Instructions (Signed)
 Please practice eye taping at night (there are videos on YouTube to demonstrate how to do this).  Your next dose of steroids will be due tomorrow morning.  Please call your oncology office on Monday. --  RETURN PRECAUTIONS & AFTERCARE: (ENGLISH) RETURN PRECAUTIONS: Return immediately to the emergency department or see/call your doctor if you feel worse, weak or have changes in speech or vision, are short of breath, have fever, vomiting, pain, bleeding or dark stool, trouble urinating or any new issues. Return here or see/call your doctor if not improving as expected for your suspected condition. FOLLOW-UP CARE: Call your doctor and/or any doctors we referred you to for more advice and to make an appointment. Do this today, tomorrow or after the weekend. Some doctors only take PPO insurance so if you have HMO insurance you may want to contact your HMO or your regular doctor for referral to a specialist within your plan. Either way tell the doctor's office that it was a referral from the emergency department so you get the soonest possible appointment.  YOUR TEST RESULTS: Take result reports of any blood or urine tests, imaging tests and EKG's to your doctor and any referral doctor. Have any abnormal tests repeated. Your doctor or a referral doctor can let you know when this should be done. Also make sure your doctor contacts this hospital to get any test results that are not currently available such as cultures or special tests for infection and final imaging reports, which are often not available at the time you leave the ER but which may list additional important findings that are not documented on the preliminary report. BLOOD PRESSURE: If your blood pressure was greater than 120/80 have your blood pressure rechecked within 1 to 2 weeks. MEDICATION SIDE EFFECTS: Do not drive, walk, bike, take the bus, etc. if you have received or are being prescribed any sedating medications such as those for pain or  anxiety or certain antihistamines like Benadryl . If you have been give one of these here get a taxi home or have a friend drive you home. Ask your pharmacist to counsel you on potential side effects of any new medication

## 2024-05-07 NOTE — ED Provider Notes (Signed)
 Clinical Course as of 05/08/24 0102  Sat May 07, 2024  8062 Received signout, patient with 48 hours of right facial numbness possible Bell's palsy, awaiting MRI brain with and without results [HD]  2052 Will consult hematology/oncology [HD]  2054 Patient taught about eye taping at night.  [HD]  2054 On ear exam, no lesions.  Patient denies any tick bites [HD]  2057 Case discussed with Dr. Babara.  Genna to initiate prolonged prednisone  course and he should follow-up with his oncologist on Monday [HD]    Clinical Course User Index [HD] Nicholaus Rolland BRAVO, MD   I did prescribe patient a 19-day prednisone  taper.  First dose given today.  EKG demonstrated no arrhythmia.  He was taught taping.  He and his wife feel reassured and feel comfortable returning home.  They will follow-up with their oncology team first thing on Monday and return if any acutely worsening symptoms  Dx: Bell's palsy  At time of discharge there is no evidence of acute life, limb, vision, or fertility threat. Patient has stable vital signs, pain is well controlled, patient is ambulatory and p.o. tolerant.  Discharge instructions were completed using the EPIC system. I would refer you to those at this time. All warnings prescriptions follow-up etc. were discussed in detail with the patient. Patient indicates understanding and is agreeable with this plan. All questions answered.  Patient is made aware that they may return to the emergency department for any worsening or new condition or for any other emergency.    Nicholaus Rolland BRAVO, MD 05/08/24 [phone]

## 2024-05-07 NOTE — ED Notes (Signed)
 Pt transported to MRI via stretcher.

## 2024-05-07 NOTE — ED Triage Notes (Signed)
 First nurse note: Pt to ED via POV from home. Pt reports left sided facial numbness since Thursday. Facial droop noted. Pt reports had spinal chemo on Wednesday and it was his 3rd time receiving that treatment.

## 2024-05-09 ENCOUNTER — Telehealth: Payer: Self-pay | Admitting: *Deleted

## 2024-05-09 NOTE — Telephone Encounter (Signed)
 Caller verified using pt's full name and dob prior to discussing PHI    Patient called to let Dr. B know that he was evaluated in the ER this weekend to r/o a stroke. He was dx with Bells Palsy. He was given an RX for prednisone . His next apt is not until 05/24/23. Patient was instructed to call our office to see if Dr. B needed to evaluate him any sooner than this appointment. Patient reports some facial numbness present, but noted improvement and is hopeful the symptoms of the Bell's Palsy will gradually resolve.  He also stated that he needs dental clearance with [facility]. He broke off a crown this weekend and needs to get the crown fixed. Dentist office can be reached at [phone] and fax # [phone].

## 2024-05-10 ENCOUNTER — Encounter: Payer: Self-pay | Admitting: Internal Medicine

## 2024-05-10 NOTE — Telephone Encounter (Signed)
 Clearance note faxed to [facility]

## 2024-05-10 NOTE — Telephone Encounter (Signed)
 I called patient and let him know that he may proceed with dental procedure as planned. He was instructed to keep his future follow-up apt as scheduled with Dr. KATHEE.

## 2024-05-10 NOTE — Telephone Encounter (Signed)
 Patient calling back and requesting follow-up from yesterday's phone call re the need for dental clearance

## 2024-05-11 ENCOUNTER — Encounter: Payer: Self-pay | Admitting: Internal Medicine

## 2024-05-12 ENCOUNTER — Encounter: Payer: Self-pay | Admitting: Internal Medicine

## 2024-05-20 ENCOUNTER — Inpatient Hospital Stay

## 2024-05-20 DIAGNOSIS — C8333 Diffuse large B-cell lymphoma, intra-abdominal lymph nodes: Secondary | ICD-10-CM

## 2024-05-20 LAB — CMP (CANCER CENTER ONLY)
ALT: 22 U/L (ref 0–44)
AST: 27 U/L (ref 15–41)
Albumin: 3.9 g/dL (ref 3.5–5.0)
Alkaline Phosphatase: 41 U/L (ref 38–126)
Anion gap: 10 (ref 5–15)
BUN: 13 mg/dL (ref 8–23)
CO2: 28 mmol/L (ref 22–32)
Calcium: 8.5 mg/dL — ABNORMAL LOW (ref 8.9–10.3)
Chloride: 103 mmol/L (ref 98–111)
Creatinine: 0.68 mg/dL (ref 0.61–1.24)
GFR, Estimated: >60 mL/min
Glucose, Bld: 84 mg/dL (ref 70–99)
Potassium: 3.2 mmol/L — ABNORMAL LOW (ref 3.5–5.1)
Sodium: 141 mmol/L (ref 135–145)
Total Bilirubin: 0.6 mg/dL (ref 0.0–1.2)
Total Protein: 6 g/dL — ABNORMAL LOW (ref 6.5–8.1)

## 2024-05-20 LAB — LACTATE DEHYDROGENASE: LDH: 278 U/L — ABNORMAL HIGH (ref 105–235)

## 2024-05-20 LAB — CBC WITH DIFFERENTIAL (CANCER CENTER ONLY)
Abs Immature Granulocytes: 0.24 K/uL — ABNORMAL HIGH (ref 0.00–0.07)
Basophils Absolute: 0 K/uL (ref 0.0–0.1)
Basophils Relative: 0 %
Eosinophils Absolute: 0.1 K/uL (ref 0.0–0.5)
Eosinophils Relative: 1 %
HCT: 36.7 % — ABNORMAL LOW (ref 39.0–52.0)
Hemoglobin: 12.3 g/dL — ABNORMAL LOW (ref 13.0–17.0)
Immature Granulocytes: 2 %
Lymphocytes Relative: 13 %
Lymphs Abs: 1.3 K/uL (ref 0.7–4.0)
MCH: 33.2 pg (ref 26.0–34.0)
MCHC: 33.5 g/dL (ref 30.0–36.0)
MCV: 99.2 fL (ref 80.0–100.0)
Monocytes Absolute: 0.7 K/uL (ref 0.1–1.0)
Monocytes Relative: 7 %
Neutro Abs: 8 K/uL — ABNORMAL HIGH (ref 1.7–7.7)
Neutrophils Relative %: 77 %
Platelet Count: 166 K/uL (ref 150–400)
RBC: 3.7 MIL/uL — ABNORMAL LOW (ref 4.22–5.81)
RDW: 14.2 % (ref 11.5–15.5)
WBC Count: 10.4 K/uL (ref 4.0–10.5)
nRBC: 0 % (ref 0.0–0.2)

## 2024-05-23 ENCOUNTER — Inpatient Hospital Stay: Admitting: Internal Medicine

## 2024-05-23 ENCOUNTER — Inpatient Hospital Stay

## 2024-05-24 ENCOUNTER — Telehealth: Payer: Self-pay

## 2024-05-24 ENCOUNTER — Ambulatory Visit

## 2024-05-24 NOTE — Telephone Encounter (Signed)
 Patient for IR LP Methotrexate  Inj on Wed 05/25/24, I called and spoke with the patient on the phone and gave pre-procedure instructions. Pt was made aware to be here at 9:30a and check in at the H&V entrance. Pt stated understanding. Called 05/24/24

## 2024-05-25 ENCOUNTER — Encounter: Payer: Self-pay | Admitting: Internal Medicine

## 2024-05-25 ENCOUNTER — Other Ambulatory Visit: Payer: Self-pay | Admitting: Internal Medicine

## 2024-05-25 ENCOUNTER — Ambulatory Visit
Admission: RE | Admit: 2024-05-25 | Discharge: 2024-05-25 | Disposition: A | Source: Ambulatory Visit | Attending: Internal Medicine | Admitting: Radiology

## 2024-05-25 ENCOUNTER — Other Ambulatory Visit: Payer: Self-pay

## 2024-05-25 ENCOUNTER — Inpatient Hospital Stay: Admitting: Internal Medicine

## 2024-05-25 VITALS — BP 158/81 | HR 82 | Temp 97.8°F | Resp 16 | Ht 72.0 in | Wt 187.7 lb

## 2024-05-25 VITALS — BP 150/93 | HR 87 | Temp 97.7°F | Resp 18 | Ht 72.0 in | Wt 189.4 lb

## 2024-05-25 DIAGNOSIS — C8333 Diffuse large B-cell lymphoma, intra-abdominal lymph nodes: Secondary | ICD-10-CM

## 2024-05-25 MED ORDER — ALBUTEROL SULFATE HFA 108 (90 BASE) MCG/ACT IN AERS: 2.0000 | INHALATION_SPRAY | Freq: Once | RESPIRATORY_TRACT | Status: DC | PRN

## 2024-05-25 MED ORDER — LIDOCAINE HCL 1 % IJ SOLN
INTRAMUSCULAR | Status: AC
  Filled 2024-05-25: qty 20

## 2024-05-25 MED ORDER — EPINEPHRINE 0.3 MG/0.3ML IJ SOAJ: 0.3000 mg | Freq: Once | INTRAMUSCULAR | Status: DC | PRN

## 2024-05-25 MED ORDER — LIDOCAINE HCL (PF) 1 % IJ SOLN
INTRAMUSCULAR | Status: AC
  Filled 2024-05-25: qty 30

## 2024-05-25 MED ORDER — FAMOTIDINE IN NACL 20-0.9 MG/50ML-% IV SOLN: 20.0000 mg | Freq: Once | INTRAVENOUS | Status: DC | PRN

## 2024-05-25 MED ORDER — METHYLPREDNISOLONE SODIUM SUCC 125 MG IJ SOLR: 125.0000 mg | Freq: Once | INTRAMUSCULAR | Status: DC | PRN

## 2024-05-25 MED ORDER — DIPHENHYDRAMINE HCL 50 MG/ML IJ SOLN: 50.0000 mg | Freq: Once | INTRAMUSCULAR | Status: DC | PRN

## 2024-05-25 MED ORDER — ACETAMINOPHEN 325 MG PO TABS: 650.0000 mg | ORAL_TABLET | ORAL | Status: DC | PRN

## 2024-05-25 MED ORDER — SODIUM CHLORIDE 0.9 % IV SOLN: Freq: Once | INTRAVENOUS | Status: DC | PRN

## 2024-05-25 MED ORDER — LISINOPRIL 20 MG PO TABS: 20.0000 mg | ORAL_TABLET | Freq: Every day | ORAL | 1 refills | Status: AC

## 2024-05-25 MED ORDER — SODIUM CHLORIDE (PF) 0.9 % IJ SOLN
Freq: Once | INTRAMUSCULAR | Status: AC
  Filled 2024-05-25: qty 0.48

## 2024-05-25 NOTE — Progress Notes (Signed)
 [facility] CONSULT NOTE  Patient Care Team: Myrla Jon HERO, MD as PCP - General (Family Medicine) Verdene Gills, RN as Oncology Nurse Navigator [name], MD as Consulting Physician (Oncology)  CHIEF COMPLAINTS/PURPOSE OF CONSULTATION: DLBCL   Oncology History Overview Note    #  STAGE IV- JULY 2025- CT chest- Right hilar mass/mediastinal adenopathy.  MRI lumbar spine shows-L2 lesion.  MRI of the pelvis- Substantial metastatic burden to the bilateral iliac bones, right ischium, bilateral posterior acetabular walls, left pubic bone, central and right sacrum, bilateral femoral neck, and bilateral intertrochanteric region. In particular, tumor in the proximal femurs  Extraosseous extension of tumor from the dominant left iliac lesion, tracking along the left iliopsoas and upper pelvic sidewall; posteriorly along the gluteus minimus and gluteus medius, and even posteriorly along the lower paraspinal and medial left gluteus maximus musculature; Extraosseous extension of tumor from the right iliac lesion, tracking along the right iliopsoas and right gluteus minimus. Tumor along the attachment site of the right rectus femoris muscle, which could predispose to avulsion. Scattered small pelvic lymph nodes; Mildly prominent median lobe of prostate gland indents the bladder base.  12/07/2023- PET scan-   Hypermetabolic right hilar and subcarinal lymphadenopathy consistent with lymphoma. Hypermetabolic disease involving the left iliac bone and adjacent iliopsoas and gluteus musculature; Hypermetabolic disease involving the bony anatomy of the pelvis and proximal femurs bilaterally, proximal left femoral diaphysis, left-sided ribs, lumbar spine, and sacral spine as well as parasacral soft tissues.  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   KI-67 IS ESSENTIALLY 100%. TO EXCLUDE A SO-CALLED HIGH-GRADE B-CELL  LYMPHOMA/DOUBLE HIT LYMPHOMA FISH IS PENDING.   MUGA SCAN- 8/19-  hepatitis panel_NEG.     # AUG 14th, 2025-  rituximab  today-pending MUGA scan.   # AUG 21st, 2025- cycle #1 of chemotherapy POLA R-CHP    # Elevated AST ALT-question etiology.   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   DLBCL (diffuse large B cell lymphoma) (HCC)  12/01/2023 Initial Diagnosis   DLBCL (diffuse large B cell lymphoma) (HCC)   12/01/2023 Cancer Staging   Staging form: Hodgkin and Non-Hodgkin Lymphoma, AJCC 8th Edition - Clinical: Stage IV (Diffuse large B-cell lymphoma) - Signed by Taim Wurm R, MD on 12/01/2023   12/10/2023 -  Chemotherapy   Patient is on Treatment Plan : NON-HODGKINS LYMPHOMA POLA-R-CHP D1 X 6 cycles / Rituximab  D1 q21d X 2 cycles       HISTORY OF PRESENTING ILLNESS: Patient ambulating-in with a cane. accompanied by wife.   Ruben Garcia 79 y.o.  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    Discussed the use of AI scribe software for clinical note transcription with the patient, who gave verbal consent to proceed.  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  He is currently receiving intrathecal chemotherapy via spinal injections for diffuse large B-cell lymphoma, with prior PET imaging demonstrating favorable response. He experiences significant pain and discomfort during  these procedures, including severe pain with local anesthesia and muscle cramping in his leg, resulting in three consecutive emergency  department visits following recent injections.  3 weeks  ago, after his most recent intrathecal chemotherapy, he developed acute unilateral facial droop and slurred speech the following day. His wife noted worsening facial droop, prompting emergency evaluation. MRI of the brain was unremarkable, and he was diagnosed with Bell's palsy. Treatment with corticosteroids led to near-complete resolution, with only minimal residual facial droop and mild difficulty closing his eye. He denies sore throat, rhinorrhea, or other prodromal symptoms. He expresses concern regarding the temporal association between intrathecal chemotherapy and neurological symptoms, as well as the repeated need for emergency care after each procedure.  He remains immunocompromised due to ongoing chemotherapy, though his white blood cell counts have improved compared to prior intravenous chemotherapy cycles. He expresses a strong desire to complete the recommended regimen but is concerned about procedural pain and adverse events.  He notes elevated blood pressure today, which he attributes to anxiety regarding the upcoming procedure and recent dehydration. He has not taken antihypertensive medication since starting chemotherapy due to previously low blood pressures. He denies chest pain, dyspnea, fevers, chills, or sweats. He expresses gratitude for his overall improvement since starting chemotherapy.          Review of Systems  Constitutional:  Positive for malaise/fatigue and weight loss. Negative for chills, diaphoresis and fever.  HENT:  Negative for nosebleeds and sore throat.   Eyes:  Negative for double vision.  Respiratory:  Negative for hemoptysis, sputum production, shortness of breath and wheezing.   Cardiovascular:  Negative for chest pain, palpitations, orthopnea and leg swelling.  Gastrointestinal:  Negative for abdominal pain, blood in stool, constipation, diarrhea, heartburn, melena, nausea and vomiting.   Genitourinary:  Negative for dysuria, frequency and urgency.  Musculoskeletal:  Positive for back pain, joint pain and myalgias.  Skin: Negative.  Negative for itching and rash.  Neurological:  Negative for dizziness, tingling, focal weakness, weakness and headaches.  Endo/Heme/Allergies:  Does not bruise/bleed easily.  Psychiatric/Behavioral:  Negative for depression. The patient is not nervous/anxious and does not have insomnia.     MEDICAL HISTORY:  Past Medical History:  Diagnosis Date   Allergic rhinitis, cause unspecified    Anemia    Basal cell carcinoma of skin, site unspecified 04/28/2010   Nasal; [place].   BPH (benign prostatic hypertrophy)    Chronic cough    Colon polyps    Diffuse large B-cell lymphoma of intra-abdominal lymph nodes (HCC) 2025   Dyspnea    Essential hypertension, benign    Hemorrhoids, internal    Incomplete bladder emptying    Narrowing of airway    Other abnormal glucose    Over weight    Personal history of colonic polyps    Personal history of other genital system and obstetric disorders(V13.29)    Pneumonia    Pure hypercholesterolemia    Unspecified disorder of kidney and ureter    Unspecified disorder of liver     SURGICAL HISTORY: Past Surgical History:  Procedure Laterality Date   BASAL CELL CARCINOMA EXCISION  2012   Facial        [place]   COLONOSCOPY  11/13/2010   single polyp; repeatin 5 years; [place]   COLONOSCOPY N/A 11/04/2021   Procedure: COLONOSCOPY;  Surgeon: Maryruth Ole DASEN, MD;  Location: [facility] ENDOSCOPY;  Service: Endoscopy;  Laterality: N/A;  REQUEST EARLY TIME   COLONOSCOPY WITH PROPOFOL  N/A 12/13/2015  Procedure: COLONOSCOPY WITH PROPOFOL ;  Surgeon: Lamar ONEIDA Holmes, MD;  Location: [facility] ENDOSCOPY;  Service: Endoscopy;  Laterality: N/A;   ear surgery  05/2012   [name].  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  Benign.   HEMORRHOID SURGERY  1978   [name]   IR IMAGING GUIDED PORT INSERTION  12/04/2023   LAPAROSCOPIC  CHOLECYSTECTOMY  1982   rotator cuff surgery Right 05/14/2016   VASECTOMY  1978   VIDEO BRONCHOSCOPY WITH ENDOBRONCHIAL ULTRASOUND Bilateral 03/15/2024   Procedure: BRONCHOSCOPY, WITH EBUS;  Surgeon: Isadora Hose, MD;  Location: [facility] ORS;  Service: Pulmonary;  Laterality: Bilateral;    SOCIAL HISTORY: Social History   Socioeconomic History   Marital status: Married    Spouse name: Heron   Number of children: 2   Years of education: 2 years college   Highest education level: Not on file  Occupational History   Occupation: retired    Comment: postal service in 2005  Tobacco Use   Smoking status: Never   Smokeless tobacco: Never  Vaping Use   Vaping status: Never Used  Substance and Sexual Activity   Alcohol use: Not Currently    Alcohol/week: 5.0 standard drinks of alcohol    Types: 5 Cans of beer per week    Comment: weekends beer, 4- 6 total Cleotilde Mallory   Drug use: No   Sexual activity: Not Currently    Partners: Female  Other Topics Concern   Not on file  Social History Narrative   Always uses seat belts. Smoke alarm and carbon monoxide detector in the home. Guns in the home stored in locked cabinet.Caffeine use: Coffee 2 servings per day, moderate amount. Exercise: Moderate walking 2 - 4 miles daily, 2 - 3 days per week.    Marital status:  Married x 47 years, happily.      Children:  2 children; 1 grandchild (20).      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      Tobacco: never      Alcohol:  Weekends; beer 4-6 per weekend.      Exercise:  Walking every other day 2.5 miles three times per day.      Seatbelt: 100%; no texting      Guns: secured guns.      Living Will: completed in 2014.  FULL CODE but no prolonged measures.        ADLs: independent with ADLs; no assistant devices   Social Drivers of Health   Tobacco Use: Low Risk (05/25/2024)   Patient History    Smoking Tobacco Use: Never    Smokeless Tobacco Use: Never    Passive  Exposure: Not on file  Financial Resource Strain: Not on file  Food Insecurity: No Food Insecurity (04/18/2024)   Epic    Worried About Programme Researcher, Broadcasting/film/video in the Last Year: Never true    Ran Out of Food in the Last Year: Never true  Transportation Needs: No Transportation Needs (04/18/2024)   Epic    Lack of Transportation (Medical): No    Lack of Transportation (Non-Medical): No  Physical Activity: Not on file  Stress: Not on file  Social Connections: Moderately Isolated (04/16/2024)   Social Connection and Isolation Panel    Frequency of Communication with Friends and Family: More than three times a week    Frequency of Social Gatherings with Friends and Family: Never    Attends Religious Services: Never    Database Administrator or Organizations:  No    Attends Club or Organization Meetings: Never    Marital Status: Married  Catering Manager Violence: Not At Risk (04/18/2024)   Epic    Fear of Current or Ex-Partner: No    Emotionally Abused: No    Physically Abused: No    Sexually Abused: No  Depression (PHQ2-9): Low Risk (05/25/2024)   Depression (PHQ2-9)    PHQ-2 Score: 0  Alcohol Screen: Low Risk (09/12/2022)   Alcohol Screen    Last Alcohol Screening Score (AUDIT): 5  Housing: Unknown (04/18/2024)   Epic    Unable to Pay for Housing in the Last Year: No    Number of Times Moved in the Last Year: Not on file    Homeless in the Last Year: No  Utilities: Not At Risk (04/18/2024)   Epic    Threatened with loss of utilities: No  Health Literacy: Not on file    FAMILY HISTORY: Family History  Problem Relation Age of Onset   Heart disease Mother        first MI in 80s   Diabetes Mother    Hyperlipidemia Mother    Arthritis Mother        rheumatoid   Cancer Father        lung   Diabetes Father    Arthritis Sister    Hyperlipidemia Sister    Hyperlipidemia Sister    Hypertension Sister    Hyperlipidemia Sister    Kidney disease Neg Hx    Prostate cancer Neg Hx     Colon cancer Neg Hx     ALLERGIES:  is allergic to codeine.  MEDICATIONS:  Current Outpatient Medications  Medication Sig Dispense Refill   acetaminophen  (TYLENOL ) 325 MG tablet Take 2 tablets (650 mg total) by mouth every 6 (six) hours as needed for mild pain (pain score 1-3), fever, headache or moderate pain (pain score 4-6) (or Fever >/= 101).     acyclovir  (ZOVIRAX ) 400 MG tablet Take 1 tablet (400 mg total) by mouth 2 (two) times daily. [to prevent shingles] 60 tablet 3   albuterol  (VENTOLIN  HFA) 108 (90 Base) MCG/ACT inhaler Inhale 1-2 puffs into the lungs every 6 (six) hours as needed. 18 g 0   allopurinol  (ZYLOPRIM ) 300 MG tablet Take 1 tablet (300 mg total) by mouth 2 (two) times daily. 120 tablet 0   benzonatate  (TESSALON ) 100 MG capsule Take 1-2 capsules (100-200 mg total) by mouth 3 (three) times daily as needed for cough. 90 capsule 0   chlorpheniramine-HYDROcodone  (TUSSIONEX) 10-8 MG/5ML Take 5 mLs by mouth at bedtime as needed for cough.     cyclobenzaprine  (FLEXERIL ) 10 MG tablet Take 1 tablet (10 mg total) by mouth 3 (three) times daily as needed for muscle spasms. 30 tablet 2   gabapentin  (NEURONTIN ) 300 MG capsule Take 1 capsule (300 mg total) by mouth at bedtime. 90 capsule 0   lactulose  (CHRONULAC ) 10 GM/15ML solution Take 15 mLs (10 g total) by mouth daily as needed for mild constipation. 236 mL 0   lisinopril  (ZESTRIL ) 20 MG tablet Take 1 tablet (20 mg total) by mouth daily. 90 tablet 1   ondansetron  (ZOFRAN ) 4 MG tablet Take 1 tablet (4 mg total) by mouth every 8 (eight) hours as needed for nausea or vomiting. 30 tablet 0   feeding supplement (ENSURE PLUS HIGH PROTEIN) LIQD Take 237 mLs by mouth 2 (two) times daily between meals. (Patient not taking: Reported on 05/25/2024) 14220 mL 0   No current  facility-administered medications for this visit.   Facility-Administered Medications Ordered in Other Visits  Medication Dose Route Frequency Provider Last Rate Last  Admin   0.9 %  sodium chloride  infusion   Intravenous Once PRN Dolan, Carissa E, RPH       albuterol  (VENTOLIN  HFA) 108 (90 Base) MCG/ACT inhaler 2 puff  2 puff Inhalation Once PRN Dolan, Carissa E, RPH       diphenhydrAMINE  (BENADRYL ) injection 50 mg  50 mg Intravenous Once PRN Dolan, Carissa E, RPH       EPINEPHrine  (EPI-PEN) injection 0.3 mg  0.3 mg Intramuscular Once PRN Dolan, Carissa E, RPH       famotidine  (PEPCID ) IVPB 20 mg premix  20 mg Intravenous Once PRN Dolan, Carissa E, RPH       methotrexate  (PF) 12 mg, hydrocortisone  sodium succinate (SOLU-CORTEF ) 50 mg in sodium chloride  (PF) 0.9 % INTRATHECAL chemo injection   Intrathecal Once Dolan, Carissa E, RPH       methylPREDNISolone  sodium succinate (SOLU-MEDROL ) 125 mg/2 mL injection 125 mg  125 mg Intravenous Once PRN Dolan, Carissa E, [facility]        PHYSICAL EXAMINATION:   Vitals:   05/25/24 0831 05/25/24 0915  BP: (!) 181/111 (!) 150/93  Pulse:    Resp:    Temp:    SpO2:      Filed Weights   05/25/24 0818  Weight: 189 lb 6.4 oz (85.9 kg)    Patient is wheezing bilaterally.  Physical Exam Vitals and nursing note reviewed.  HENT:     Head: Normocephalic and atraumatic.     Mouth/Throat:     Pharynx: Oropharynx is clear.  Eyes:     Extraocular Movements: Extraocular movements intact.     Pupils: Pupils are equal, round, and reactive to light.  Cardiovascular:     Rate and Rhythm: Normal rate and regular rhythm.  Pulmonary:     Comments: Decreased breath sounds bilaterally.  Abdominal:     Palpations: Abdomen is soft.  Musculoskeletal:        General: Normal range of motion.     Cervical back: Normal range of motion.  Skin:    General: Skin is warm.  Neurological:     General: No focal deficit present.     Mental Status: He is alert and oriented to person, place, and time.  Psychiatric:        Behavior: Behavior normal.        Judgment: Judgment normal.     LABORATORY DATA:  I have reviewed the data as  listed Lab Results  Component Value Date   WBC 10.4 05/20/2024   HGB 12.3 (L) 05/20/2024   HCT 36.7 (L) 05/20/2024   MCV 99.2 05/20/2024   PLT 166 05/20/2024   Recent Labs    11/19/23 1347 11/20/23 0511 05/02/24 1434 05/07/24 1949 05/20/24 0954  NA  --    < > 138 139 141  K  --    < > 3.8 3.9 3.2*  CL  --    < > 102 106 103  CO2  --    < > 27 23 28   GLUCOSE  --    < > 102* 95 84  BUN  --    < > 13 8 13   CREATININE  --    < > 0.83 0.81 0.68  CALCIUM   --    < > 9.0 8.7* 8.5*  GFRNONAA  --    < > >60 >60 >60  PROT  --    < > 6.5 6.2* 6.0*  ALBUMIN  --    < > 4.0 3.8 3.9  AST  --    < > 27 36 27  ALT  --    < > 17 26 22   ALKPHOS  --    < > 48 46 41  BILITOT 2.5*   < > 0.6 0.8 0.6  BILIDIR 0.3*  --   --   --   --   IBILI 2.2*  --   --   --   --    < > = values in this interval not displayed.    RADIOGRAPHIC STUDIES: I have personally reviewed the radiological images as listed and agreed with the findings in the report. MR Brain W and Wo Contrast Result Date: 05/07/2024 EXAM: MRI BRAIN WITH AND WITHOUT CONTRAST 05/07/2024 07:24:45 PM TECHNIQUE: Multiplanar multisequence MRI of the head/brain was performed with and without the administration of intravenous contrast. COMPARISON: None available. CLINICAL HISTORY: Metastatic disease evaluation; Neuro deficit, acute, stroke suspected. FINDINGS: BRAIN AND VENTRICLES: No acute infarct. No acute intracranial hemorrhage. No mass effect or midline shift. No hydrocephalus. The sella is unremarkable. Normal flow voids. No mass or abnormal enhancement. ORBITS: No acute abnormality. SINUSES: No acute abnormality. BONES AND SOFT TISSUES: Normal bone marrow signal and enhancement. No acute soft tissue abnormality. IMPRESSION: 1. No acute intracranial abnormality. 2. No mass or abnormal enhancement. Electronically signed by: Franky Stanford MD MD 05/07/2024 07:40 PM EST RP Workstation: [id number]   IR LUMBAR PUNCTURE W CHEMO INJECT Result Date:  05/04/2024 CLINICAL DATA:  79 year old male with lymphoma including leptomeningeal disease. He presents for image guided lumbar puncture with instillation of intrathecal contrast. EXAM: FLUOROSCOPICALLY GUIDED LUMBAR PUNCTURE FOR INTRATHECAL CHEMOTHERAPY FLUOROSCOPY: Radiation Exposure Index (as provided by the fluoroscopic device): 5.0 mGy Kerma PROCEDURE: Informed consent was obtained from the patient prior to the procedure, including potential complications of headache, allergy, and pain. With the patient prone, the lower back was prepped with Betadine. 1% Lidocaine  was used for local anesthesia. Lumbar puncture was performed at the L2-L3 level using a gauge needle with return of clear CSF. Opening pressure was less than 20 cm water. The provided methotrexate  was injected into the subarachnoid space. The patient tolerated the procedure well without apparent complication. IMPRESSION: Successful L2-L3 image guided lumbar puncture with instillation of intrathecal methotrexate . Electronically Signed   By: Wilkie Lent M.D.   On: 05/04/2024 12:18   NM PET Image Restage (PS) Skull Base to Thigh (F-18 FDG) Result Date: 04/30/2024 EXAM: PET AND CT SKULL BASE TO MID THIGH 04/25/2024 09:49:46 AM TECHNIQUE: RADIOPHARMACEUTICAL: 9.21 mCi F-18 FDG Uptake time 60 minutes. Glucose level 98 mg/dl. Blood pool SUV 1.7; liver activity 3.2. PET imaging was acquired from the base of the skull to the mid thighs. Non-contrast enhanced computed tomography was obtained for attenuation correction and anatomic localization. COMPARISON: PET of 01/20/2024. Diagnostic CTs of 02/26/2024. CLINICAL HISTORY: lymphoma- post chemo lymphoma- post chemo FINDINGS: HEAD AND NECK: Bilateral carotid atherosclerosis. No hypermetabolic cervical lymph nodes are identified. CHEST: The hypermetabolism about the right mainstem bronchus is primarily resolved. There is a focus of residual activity about the anterior wall of the proximal most right mainstem  bronchus which corresponds to a soft tissue nodule. Example 6 mm and SUV 6.5. The diffuse activity along the course of the right mainstem bronchus measured SUV 7.6 previously. The soft tissue thickening is improved, especially posteriorly. Minimal anterior soft tissue thickening remains  included on image 59 / 6. Otherwise, no thoracic nodal or pulmonary parenchymal hypermetabolism identified. There are no hypermetabolic mediastinal, hilar or axillary lymph nodes. No hypermetabolic pulmonary activity or suspicious nodularity. Trace bilateral pleural fluid or thickening, similar and likely physiologic. Aortic and coronary artery calcifications. Right port-a-cath tip at right atrium. ABDOMEN AND PELVIS: Cholecystectomy. Interpolar left renal lesion measures 1.7 cm and greater than fluid density on image 109 / 6. Similar in size to the prior exam when re-measured in a similar fashion. Fat-containing left inguinal hernia. Abdominal aortic atherosclerosis. There is no hypermetabolic activity within the liver, adrenal glands, spleen or pancreas. There is no hypermetabolic nodal activity in the abdomen or pelvis. No metabolically active intraperitoneal mass. Physiologic activity within the gastrointestinal and genitourinary systems. BONES AND SOFT TISSUE: Again identified are diffuse scattered sclerotic lesions, including within the left acetabulum and right L2 vertebral body are similar and likely bone islands. There is no hypermetabolic activity to suggest osseous metastatic disease. No metabolically active aggressive osseous lesion. IMPRESSION: 1. Improvement in hypermetabolism along the right mainstem bronchus. More focal area of hypermetabolism corresponding to anterior right mainstem bronchus nodularity, favoring small volume residual lymphoma. [place]au[place] 4. 2. No new or progressive disease. 3. Indeterminate left renal lesion, similar in size but favored to represent a minimally complex cyst. 4. Aortic atherosclerosis  (icd10-i70.0). Coronary artery atherosclerosis. Electronically signed by: Rockey Kilts MD 04/30/2024 11:47 AM EST RP Workstation: [id number]     DLBCL (diffuse large B cell lymphoma) (HCC) #  DLBCL- NON-GCB sub type- STAGE IV- JULY 2025- 12/07/2023- PET scan-   Hypermetabolic right hilar and subcarinal lymphadenopathy consistent with lymphoma. Hypermetabolic disease involving the left iliac bone and adjacent iliopsoas and gluteus musculature; Hypermetabolic disease involving the bony anatomy of the pelvis and proximal femurs bilaterally, proximal left femoral diaphysis, left-sided ribs, lumbar spine, and sacral spine as well as parasacral soft tissues.   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   KI-67 IS ESSENTIALLY 100%. 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   hepatitis panel NEG. AUG 19th, 2025-  MUGA scan- left ventricular ejection fraction equals 53.5 %. 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 # s/p 6 cycles of Chemo- DEC 22nd, 2025-  Improvement in hypermetabolism along the right mainstem bronchus. More focal area of hypermetabolism corresponding to anterior right mainstem bronchus nodularity, favoring small volume residual lymphoma. [place]auville 4 [however bronchoscopy November 2025-negative for malignancy consistent with scarring]. No new or progressive disease.   # currently s/p cycle # 6 POLA R-CHP. Will also proceed with intrathecal IT prophylaxis #4 out of total 5 [patient preference].  Patient is concerned about the persistent complications from ongoing therapy admission to the  hospital after each chemotherapy.  He is also concerned about the discomfort/pain from intrathecal chemotherapy.  However-further decisions for #5 will plan clonseeq MRD testing at next visit.   # Bells palsy: Status post corticosteroids improved.  # ID prophylaxis-recommend acyclovir  400 twice daily; Bactrim  Monday Wednesday Friday.  Tumor lysis prophylaxis-allopurinol  300 mg a day- stable.   # Blood pressure: Elevation- repeat BP today; will restart the patient on lisinopril  20 mg a  day.  New prescription sent.   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 continue Percocet prn.  stable.   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   #Incidental findings on Imaging  PET- CT , 2025:  Similar size of a 1.5 cm left renal lesion which measures greater than fluiddensity. Complex cyst is favored over a solid neoplasm, given lack of significant activity-  pre and post-contrast abdominal MRI- will plan down the line   # IV access: s/p Mediport placement.   PS- # DISPOSITION: # repeat BP today # It chemo today # follow in 3 weeks- MD;-labs- cbc/cmp; LDH;clonseeq MRD testing  - D-2 IT chemo with IR-Dr.B       Cindy JONELLE Joe, MD 05/25/2024 9:29 AM

## 2024-05-25 NOTE — Progress Notes (Signed)
 ED 05/07/24 for Bell's palsy. Pt states he is having side effects and ending up in the ED after every treatment.

## 2024-05-25 NOTE — Procedures (Signed)
 Interventional Radiology Procedure:   Indications: Lymphoma  Procedure: Administration of intrathecal chemotherapy  Findings: LP at L3-4.  Opening pressure was 13 cm.  5 ml of methotrexate  given.    Complications: None     EBL: Minimal  Plan: Bedrest 2 hours   Farah Benish R. Philip, MD  Pager: [phone]

## 2024-05-25 NOTE — Discharge Instructions (Signed)
Lumbar Puncture, Care After Refer to this sheet in the next few weeks. These instructions provide you with information on caring for yourself after your procedure. Your health care provider may also give you more specific instructions. Your treatment has been planned according to current medical practices, but problems sometimes occur. Call your health care provider if you have any problems or questions after your procedure. What can I expect after the procedure? After your procedure, it is typical to have the following sensations: Mild discomfort or pain at the insertion site. Mild headache that is relieved with pain medicines.  Follow these instructions at home:  Avoid lifting anything heavier than 10 lb (4.5 kg) for at least 12 hours after the procedure. Drink enough fluids to keep your urine clear or pale yellow. Lay flat or as flat as possible for the remainder of the day. Contact a health care provider if: You have fever or chills. You have nausea or vomiting. You have a headache that lasts for more than 2 days. Get help right away if: You have any numbness or tingling in your legs. You are unable to control your bowel or bladder. You have bleeding or swelling in your back at the insertion site. You are dizzy or faint. This information is not intended to replace advice given to you by your health care provider. Make sure you discuss any questions you have with your health care provider. Document Released: 04/19/2013 Document Revised: 09/20/2015 Document Reviewed: 12/21/2012 Elsevier Interactive Patient Education  2017 [employer] 

## 2024-05-25 NOTE — Assessment & Plan Note (Addendum)
#    DLBCL- NON-GCB sub type- STAGE IV- JULY 2025- 12/07/2023- PET scan-   Hypermetabolic right hilar and subcarinal lymphadenopathy consistent with lymphoma. Hypermetabolic disease involving the left iliac bone and adjacent iliopsoas and gluteus musculature; Hypermetabolic disease involving the bony anatomy of the pelvis and proximal femurs bilaterally, proximal left femoral diaphysis, left-sided ribs, lumbar spine, and sacral spine as well as parasacral soft tissues.   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   KI-67 IS ESSENTIALLY 100%. 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   hepatitis panel NEG. AUG 19th, 2025-  MUGA scan- left ventricular ejection fraction equals 53.5 %. 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 # s/p 6 cycles of Chemo- DEC 22nd, 2025-  Improvement in hypermetabolism along the right mainstem bronchus. More focal area of hypermetabolism corresponding to anterior right mainstem bronchus nodularity, favoring small volume residual lymphoma. [place] 4 [however bronchoscopy November 2025-negative for malignancy consistent with scarring]. No new or progressive disease.   # currently s/p cycle # 6 POLA R-CHP. Will also proceed with intrathecal IT prophylaxis #4 out of total 5 [patient preference].  Patient is concerned about the persistent complications from ongoing therapy admission to the hospital after each chemotherapy.  He is also concerned about the discomfort/pain from intrathecal chemotherapy.  However-further decisions for #5 will plan clonseeq MRD testing at next visit.    # Bells palsy: Status post corticosteroids improved.  # ID prophylaxis-recommend acyclovir  400 twice daily; Bactrim  Monday Wednesday Friday.  Tumor lysis prophylaxis-allopurinol  300 mg a day- stable.   # Blood pressure: Elevation- repeat BP today; will restart the patient on lisinopril  20 mg a day.  New prescription sent.   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 continue Percocet prn.  stable.   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   #Incidental findings on Imaging  PET- CT , 2025:  Similar size of a 1.5 cm left renal lesion which measures greater than fluiddensity. Complex cyst is favored over a solid neoplasm, given lack of significant activity-  pre and post-contrast abdominal MRI- will plan down the line   # IV access: s/p Mediport placement.   PS- # DISPOSITION: # repeat BP today # It chemo today # follow in 3 weeks- MD;-labs- cbc/cmp; LDH;clonseeq MRD testing  - D-2 IT chemo with IR-Dr.B

## 2024-05-26 NOTE — Progress Notes (Signed)
 Called patient to check on post lumbar puncture/injection.  Patient has not had any complications tolerated the procedure well.  Keep follow-up as planned GB

## 2024-06-14 ENCOUNTER — Ambulatory Visit

## 2024-06-14 ENCOUNTER — Inpatient Hospital Stay: Admitting: Internal Medicine

## 2024-06-14 ENCOUNTER — Inpatient Hospital Stay

## 2024-06-15 ENCOUNTER — Ambulatory Visit: Admitting: Radiology

## 2024-10-20 ENCOUNTER — Ambulatory Visit: Admitting: Urology

## 2024-10-25 ENCOUNTER — Ambulatory Visit: Admitting: Urology
